# Patient Record
Sex: Male | Born: 1940 | Race: White | Hispanic: No | Marital: Married | Smoking: Former smoker
Health system: Southern US, Community
[De-identification: ages and names within clinical notes are randomized; demographics above are authoritative.]

## PROBLEM LIST (undated history)

## (undated) DIAGNOSIS — C9590 Leukemia, unspecified not having achieved remission: Secondary | ICD-10-CM

## (undated) DIAGNOSIS — L0291 Cutaneous abscess, unspecified: Secondary | ICD-10-CM

## (undated) HISTORY — PX: BACK SURGERY: SHX140

---

## 2004-02-10 ENCOUNTER — Emergency Department (HOSPITAL_COMMUNITY): Admission: EM | Admit: 2004-02-10 | Discharge: 2004-02-10 | Payer: Self-pay | Admitting: Emergency Medicine

## 2004-06-24 ENCOUNTER — Ambulatory Visit: Payer: Self-pay | Admitting: Internal Medicine

## 2004-07-22 ENCOUNTER — Ambulatory Visit: Payer: Self-pay | Admitting: Internal Medicine

## 2009-08-21 ENCOUNTER — Emergency Department (HOSPITAL_COMMUNITY): Admission: EM | Admit: 2009-08-21 | Discharge: 2009-08-21 | Payer: Self-pay | Admitting: Emergency Medicine

## 2009-09-04 ENCOUNTER — Ambulatory Visit: Payer: Self-pay | Admitting: Urology

## 2009-11-21 ENCOUNTER — Ambulatory Visit: Payer: Self-pay | Admitting: Internal Medicine

## 2009-12-11 ENCOUNTER — Ambulatory Visit: Payer: Self-pay | Admitting: Internal Medicine

## 2009-12-22 ENCOUNTER — Ambulatory Visit: Payer: Self-pay | Admitting: Internal Medicine

## 2010-01-22 ENCOUNTER — Ambulatory Visit: Payer: Self-pay | Admitting: Internal Medicine

## 2010-03-17 ENCOUNTER — Ambulatory Visit: Payer: Self-pay

## 2010-03-24 ENCOUNTER — Ambulatory Visit: Payer: Self-pay | Admitting: Unknown Physician Specialty

## 2010-03-31 ENCOUNTER — Inpatient Hospital Stay: Payer: Self-pay | Admitting: Unknown Physician Specialty

## 2010-08-17 LAB — URINALYSIS, ROUTINE W REFLEX MICROSCOPIC
Bilirubin Urine: NEGATIVE
Nitrite: NEGATIVE
Urobilinogen, UA: 0.2 mg/dL (ref 0.0–1.0)
pH: 5.5 (ref 5.0–8.0)

## 2010-08-17 LAB — URINE MICROSCOPIC-ADD ON

## 2012-04-01 ENCOUNTER — Emergency Department: Payer: Self-pay | Admitting: Internal Medicine

## 2012-05-04 ENCOUNTER — Ambulatory Visit: Payer: Self-pay | Admitting: Internal Medicine

## 2012-05-05 ENCOUNTER — Ambulatory Visit: Payer: Self-pay | Admitting: Internal Medicine

## 2012-05-24 ENCOUNTER — Ambulatory Visit: Payer: Self-pay | Admitting: Internal Medicine

## 2013-01-16 ENCOUNTER — Ambulatory Visit: Payer: Self-pay | Admitting: Physical Medicine and Rehabilitation

## 2013-10-21 DIAGNOSIS — C9110 Chronic lymphocytic leukemia of B-cell type not having achieved remission: Secondary | ICD-10-CM | POA: Insufficient documentation

## 2016-07-23 ENCOUNTER — Ambulatory Visit
Admission: RE | Admit: 2016-07-23 | Discharge: 2016-07-23 | Disposition: A | Discharge status: Home / Self Care | Payer: BLUE CROSS/BLUE SHIELD | Source: Ambulatory Visit | Attending: Unknown Physician Specialty | Admitting: Unknown Physician Specialty

## 2016-07-23 ENCOUNTER — Other Ambulatory Visit: Payer: Self-pay | Admitting: Unknown Physician Specialty

## 2016-07-23 DIAGNOSIS — M5137 Other intervertebral disc degeneration, lumbosacral region: Secondary | ICD-10-CM | POA: Diagnosis not present

## 2016-07-23 DIAGNOSIS — M48061 Spinal stenosis, lumbar region without neurogenic claudication: Secondary | ICD-10-CM | POA: Diagnosis not present

## 2016-07-23 DIAGNOSIS — M8938 Hypertrophy of bone, other site: Secondary | ICD-10-CM | POA: Diagnosis not present

## 2016-07-23 DIAGNOSIS — M5136 Other intervertebral disc degeneration, lumbar region: Secondary | ICD-10-CM | POA: Insufficient documentation

## 2016-10-29 ENCOUNTER — Inpatient Hospital Stay
Admission: EM | Admit: 2016-10-29 | Discharge: 2016-11-09 | DRG: 854 | Disposition: A | Discharge status: Home Health | Payer: BLUE CROSS/BLUE SHIELD | Attending: Internal Medicine | Admitting: Internal Medicine

## 2016-10-29 ENCOUNTER — Emergency Department: Payer: BLUE CROSS/BLUE SHIELD

## 2016-10-29 DIAGNOSIS — E669 Obesity, unspecified: Secondary | ICD-10-CM | POA: Diagnosis present

## 2016-10-29 DIAGNOSIS — N132 Hydronephrosis with renal and ureteral calculous obstruction: Secondary | ICD-10-CM | POA: Diagnosis not present

## 2016-10-29 DIAGNOSIS — M25561 Pain in right knee: Secondary | ICD-10-CM | POA: Diagnosis present

## 2016-10-29 DIAGNOSIS — A4101 Sepsis due to Methicillin susceptible Staphylococcus aureus: Principal | ICD-10-CM | POA: Diagnosis present

## 2016-10-29 DIAGNOSIS — N261 Atrophy of kidney (terminal): Secondary | ICD-10-CM | POA: Diagnosis not present

## 2016-10-29 DIAGNOSIS — N179 Acute kidney failure, unspecified: Secondary | ICD-10-CM | POA: Diagnosis present

## 2016-10-29 DIAGNOSIS — K5730 Diverticulosis of large intestine without perforation or abscess without bleeding: Secondary | ICD-10-CM | POA: Diagnosis not present

## 2016-10-29 DIAGNOSIS — R32 Unspecified urinary incontinence: Secondary | ICD-10-CM | POA: Diagnosis present

## 2016-10-29 DIAGNOSIS — Z833 Family history of diabetes mellitus: Secondary | ICD-10-CM

## 2016-10-29 DIAGNOSIS — A419 Sepsis, unspecified organism: Secondary | ICD-10-CM

## 2016-10-29 DIAGNOSIS — K5720 Diverticulitis of large intestine with perforation and abscess without bleeding: Secondary | ICD-10-CM | POA: Diagnosis present

## 2016-10-29 DIAGNOSIS — I472 Ventricular tachycardia: Secondary | ICD-10-CM | POA: Diagnosis not present

## 2016-10-29 DIAGNOSIS — C9110 Chronic lymphocytic leukemia of B-cell type not having achieved remission: Secondary | ICD-10-CM | POA: Diagnosis present

## 2016-10-29 DIAGNOSIS — Z6834 Body mass index (BMI) 34.0-34.9, adult: Secondary | ICD-10-CM

## 2016-10-29 DIAGNOSIS — E860 Dehydration: Secondary | ICD-10-CM | POA: Diagnosis present

## 2016-10-29 DIAGNOSIS — I480 Paroxysmal atrial fibrillation: Secondary | ICD-10-CM | POA: Diagnosis present

## 2016-10-29 DIAGNOSIS — M6282 Rhabdomyolysis: Secondary | ICD-10-CM | POA: Diagnosis present

## 2016-10-29 DIAGNOSIS — I10 Essential (primary) hypertension: Secondary | ICD-10-CM | POA: Diagnosis present

## 2016-10-29 DIAGNOSIS — N201 Calculus of ureter: Secondary | ICD-10-CM | POA: Diagnosis not present

## 2016-10-29 DIAGNOSIS — A0472 Enterocolitis due to Clostridium difficile, not specified as recurrent: Secondary | ICD-10-CM | POA: Diagnosis present

## 2016-10-29 DIAGNOSIS — E876 Hypokalemia: Secondary | ICD-10-CM | POA: Diagnosis present

## 2016-10-29 DIAGNOSIS — R1032 Left lower quadrant pain: Secondary | ICD-10-CM

## 2016-10-29 DIAGNOSIS — Z87891 Personal history of nicotine dependence: Secondary | ICD-10-CM

## 2016-10-29 DIAGNOSIS — L02212 Cutaneous abscess of back [any part, except buttock]: Secondary | ICD-10-CM | POA: Diagnosis present

## 2016-10-29 DIAGNOSIS — W19XXXA Unspecified fall, initial encounter: Secondary | ICD-10-CM

## 2016-10-29 DIAGNOSIS — K5792 Diverticulitis of intestine, part unspecified, without perforation or abscess without bleeding: Secondary | ICD-10-CM

## 2016-10-29 DIAGNOSIS — Z79899 Other long term (current) drug therapy: Secondary | ICD-10-CM | POA: Diagnosis not present

## 2016-10-29 DIAGNOSIS — L03319 Cellulitis of trunk, unspecified: Secondary | ICD-10-CM

## 2016-10-29 DIAGNOSIS — L02219 Cutaneous abscess of trunk, unspecified: Secondary | ICD-10-CM | POA: Diagnosis present

## 2016-10-29 HISTORY — DX: Leukemia, unspecified not having achieved remission: C95.90

## 2016-10-29 LAB — URINALYSIS, ROUTINE W REFLEX MICROSCOPIC
Bilirubin Urine: NEGATIVE
GLUCOSE, UA: NEGATIVE mg/dL
Ketones, ur: NEGATIVE mg/dL
Leukocytes, UA: NEGATIVE
Nitrite: NEGATIVE
PROTEIN: NEGATIVE mg/dL
Specific Gravity, Urine: 1.018 (ref 1.005–1.030)
pH: 5 (ref 5.0–8.0)

## 2016-10-29 LAB — COMPREHENSIVE METABOLIC PANEL
ALK PHOS: 62 U/L (ref 38–126)
ALT: 55 U/L (ref 17–63)
AST: 80 U/L — AB (ref 15–41)
Albumin: 3.5 g/dL (ref 3.5–5.0)
Anion gap: 13 (ref 5–15)
BILIRUBIN TOTAL: 1 mg/dL (ref 0.3–1.2)
BUN: 34 mg/dL — AB (ref 6–20)
CHLORIDE: 96 mmol/L — AB (ref 101–111)
CO2: 25 mmol/L (ref 22–32)
CREATININE: 1.65 mg/dL — AB (ref 0.61–1.24)
Calcium: 9.6 mg/dL (ref 8.9–10.3)
GFR calc Af Amer: 46 mL/min — ABNORMAL LOW (ref >60)
GFR, EST NON AFRICAN AMERICAN: 39 mL/min — AB (ref >60)
Glucose, Bld: 118 mg/dL — ABNORMAL HIGH (ref 65–99)
Potassium: 3.8 mmol/L (ref 3.5–5.1)
Sodium: 134 mmol/L — ABNORMAL LOW (ref 135–145)
TOTAL PROTEIN: 6.9 g/dL (ref 6.5–8.1)

## 2016-10-29 LAB — CBC
HEMATOCRIT: 45.4 % (ref 40.0–52.0)
HEMOGLOBIN: 15.3 g/dL (ref 13.0–18.0)
MCH: 30.7 pg (ref 26.0–34.0)
MCHC: 33.6 g/dL (ref 32.0–36.0)
MCV: 91.3 fL (ref 80.0–100.0)
Platelets: 368 10*3/uL (ref 150–440)
RBC: 4.97 MIL/uL (ref 4.40–5.90)
RDW: 13.3 % (ref 11.5–14.5)
WBC: 41.2 10*3/uL — AB (ref 3.8–10.6)

## 2016-10-29 LAB — DIFFERENTIAL
BASOS PCT: 0 %
BLASTS: 0 %
Band Neutrophils: 1 %
Basophils Absolute: 0 10*3/uL (ref 0–0.1)
EOS PCT: 0 %
Eosinophils Absolute: 0 10*3/uL (ref 0–0.7)
LYMPHS ABS: 17.7 10*3/uL — AB (ref 1.0–3.6)
Lymphocytes Relative: 43 %
METAMYELOCYTES PCT: 0 %
Monocytes Absolute: 2.5 10*3/uL — ABNORMAL HIGH (ref 0.2–1.0)
Monocytes Relative: 6 %
Myelocytes: 0 %
NRBC: 0 /100{WBCs}
Neutro Abs: 21 10*3/uL — ABNORMAL HIGH (ref 1.4–6.5)
Neutrophils Relative %: 50 %
Other: 0 %
Promyelocytes Absolute: 0 %

## 2016-10-29 LAB — LACTIC ACID, PLASMA: Lactic Acid, Venous: 1.3 mmol/L (ref 0.5–1.9)

## 2016-10-29 LAB — CK: Total CK: 1184 U/L — ABNORMAL HIGH (ref 49–397)

## 2016-10-29 MED ORDER — VANCOMYCIN HCL 10 G IV SOLR
1250.0000 mg | Freq: Two times a day (BID) | INTRAVENOUS | Status: DC
  Administered 2016-10-29 – 2016-11-03 (×10): 1250 mg via INTRAVENOUS
  Filled 2016-10-29 (×11): qty 1250

## 2016-10-29 MED ORDER — ENOXAPARIN SODIUM 40 MG/0.4ML ~~LOC~~ SOLN
40.0000 mg | SUBCUTANEOUS | Status: DC
  Administered 2016-10-29 – 2016-10-30 (×2): 40 mg via SUBCUTANEOUS
  Filled 2016-10-29 (×5): qty 0.4

## 2016-10-29 MED ORDER — ONDANSETRON HCL 4 MG PO TABS
4.0000 mg | ORAL_TABLET | Freq: Three times a day (TID) | ORAL | Status: DC | PRN
  Administered 2016-11-09: 4 mg via ORAL
  Filled 2016-10-29: qty 1

## 2016-10-29 MED ORDER — VANCOMYCIN HCL IN DEXTROSE 1-5 GM/200ML-% IV SOLN
1000.0000 mg | Freq: Once | INTRAVENOUS | Status: AC
Start: 2016-10-29 — End: 2016-10-29
  Administered 2016-10-29: 1000 mg via INTRAVENOUS
  Filled 2016-10-29: qty 200

## 2016-10-29 MED ORDER — HYDROCODONE-ACETAMINOPHEN 5-325 MG PO TABS
1.0000 | ORAL_TABLET | Freq: Every evening | ORAL | Status: DC | PRN
  Administered 2016-10-31 (×2): 1 via ORAL
  Filled 2016-10-29 (×3): qty 1

## 2016-10-29 MED ORDER — PIPERACILLIN-TAZOBACTAM 3.375 G IVPB 30 MIN
3.3750 g | Freq: Once | INTRAVENOUS | Status: AC
  Administered 2016-10-29: 3.375 g via INTRAVENOUS
  Filled 2016-10-29: qty 50

## 2016-10-29 MED ORDER — PIPERACILLIN-TAZOBACTAM 3.375 G IVPB
3.3750 g | Freq: Three times a day (TID) | INTRAVENOUS | Status: DC
  Administered 2016-10-29 – 2016-11-01 (×9): 3.375 g via INTRAVENOUS
  Filled 2016-10-29 (×11): qty 50

## 2016-10-29 MED ORDER — ACETAMINOPHEN 325 MG PO TABS: 650.0000 mg | ORAL_TABLET | Freq: Four times a day (QID) | ORAL | Status: DC | PRN

## 2016-10-29 MED ORDER — SODIUM CHLORIDE 0.9 % IV BOLUS (SEPSIS)
1000.0000 mL | Freq: Once | INTRAVENOUS | Status: AC
  Administered 2016-10-29: 1000 mL via INTRAVENOUS

## 2016-10-29 MED ORDER — DOCUSATE SODIUM 100 MG PO CAPS
100.0000 mg | ORAL_CAPSULE | Freq: Two times a day (BID) | ORAL | Status: DC
  Administered 2016-10-29 – 2016-10-31 (×5): 100 mg via ORAL
  Filled 2016-10-29 (×8): qty 1

## 2016-10-29 MED ORDER — VANCOMYCIN HCL 10 G IV SOLR
1250.0000 mg | INTRAVENOUS | Status: DC
  Filled 2016-10-29 (×2): qty 1250

## 2016-10-29 MED ORDER — SODIUM CHLORIDE 0.9 % IV SOLN
INTRAVENOUS | Status: AC
  Administered 2016-10-29 – 2016-10-30 (×2): via INTRAVENOUS

## 2016-10-29 MED ORDER — ACETAMINOPHEN 325 MG PO TABS
650.0000 mg | ORAL_TABLET | Freq: Once | ORAL | Status: AC
  Administered 2016-10-29: 650 mg via ORAL
  Filled 2016-10-29: qty 2

## 2016-10-29 MED ORDER — TRAMADOL HCL 50 MG PO TABS
50.0000 mg | ORAL_TABLET | Freq: Four times a day (QID) | ORAL | Status: DC | PRN
  Administered 2016-10-31 – 2016-11-09 (×15): 50 mg via ORAL
  Filled 2016-10-29 (×15): qty 1

## 2016-10-29 NOTE — H&P (Signed)
[facility] NAME: [name]    MR#:  [id number]  DATE OF BIRTH:  01-31-1941  DATE OF ADMISSION:  10/29/2016  PRIMARY CARE PHYSICIAN: [name], MD   REQUESTING/REFERRING PHYSICIAN: Corky Downs  CHIEF COMPLAINT:   Fall and generalized weakness HISTORY OF PRESENT ILLNESS:  William Kennedy  is a 76 y.o. male with a known history of CLL, with baseline WBC at around 25,000 is presenting to the ED after he sustained a fall last night. Patient reports he was feeling extremely weak and legs are giving away. Last night patient slid down to the floor and was unable to get up. He was laying on the floor for approximately 6 hours and then called EMS who helped him to go back to bed. Patient woke up today with body aches. Patient also has his abscess on the back drained by Dr. Tamala Julian, culture has revealed Staphylococcus aureus and patient was started on Augmentin yesterday. Patient is not quite sure whether it is MRSA. Hospitalist team is called to admit the patient as patient's white count is elevated and he was tachycardic for sepsis called by the ED  PAST MEDICAL HISTORY:   Past Medical History:  Diagnosis Date  . Leukemia ([place])     PAST SURGICAL HISTOIRY:   Past Surgical History:  Procedure Laterality Date  . BACK SURGERY      SOCIAL HISTORY:   Social History  Substance Use Topics  . Smoking status: Former Research scientist (life sciences)  . Smokeless tobacco: Former Systems developer  . Alcohol use No    FAMILY HISTORY:   Family History  Problem Relation Age of Onset  . Diabetes Mother   . Cancer Father     DRUG ALLERGIES:  No Known Allergies  REVIEW OF SYSTEMS:  CONSTITUTIONAL: No fever, Reports fatigue or weakness.  EYES: No blurred or double vision.  EARS, NOSE, AND THROAT: No tinnitus or ear pain.  RESPIRATORY: No cough, shortness of breath, wheezing or hemoptysis.  CARDIOVASCULAR: No chest pain, orthopnea, edema.  GASTROINTESTINAL: No  nausea, vomiting, diarrhea or abdominal pain.  GENITOURINARY: No dysuria, hematuria.  ENDOCRINE: No polyuria, nocturia,  HEMATOLOGY: No anemia, easy bruising or bleeding SKIN: Skin on the left side of the back is red and has 2 drains from the abscess  MUSCULOSKELETAL: No joint pain or arthritis.   NEUROLOGIC: No tingling, numbness, weakness.  PSYCHIATRY: No anxiety or depression.   MEDICATIONS AT HOME:   Prior to Admission medications   Medication Sig Start Date End Date Taking? Authorizing Provider  acetaminophen (TYLENOL) 325 MG tablet Take 650 mg by mouth every 4 (four) hours as needed for pain.   Yes [provider]  amoxicillin-clavulanate (AUGMENTIN) 875-125 MG tablet Take 1 tablet by mouth 2 (two) times daily. 10/28/16 11/06/16 Yes [provider]  HYDROcodone-acetaminophen (NORCO/VICODIN) 5-325 MG tablet Take 1-2 tablets by mouth at bedtime as needed for pain. 10/25/16  Yes [provider]  ondansetron (ZOFRAN) 4 MG tablet Take 4 mg by mouth every 8 (eight) hours as needed for nausea. 10/28/16  Yes [provider]  traMADol (ULTRAM) 50 MG tablet Take 50 mg by mouth every 6 (six) hours as needed for pain. 08/16/16  Yes [provider]      VITAL SIGNS:  Blood pressure (!) 119/57, pulse 92, temperature 97.8 F (36.6 C), temperature source Oral, resp. rate 20, height 6\' 2"  (1.88 m), weight 122.5 kg (270 lb), SpO2 97 %.  PHYSICAL EXAMINATION:  GENERAL:  76 y.o.-year-old patient lying in the bed with no acute distress.  EYES: Pupils equal, round, reactive to light and accommodation. No scleral icterus. Extraocular muscles intact.  HEENT: Head atraumatic, normocephalic. Oropharynx and nasopharynx clear.  NECK:  Supple, no jugular venous distention. No thyroid enlargement, no tenderness.  LUNGS: Normal breath sounds bilaterally, no wheezing, rales,rhonchi or crepitation. No use of accessory muscles of respiration.  CARDIOVASCULAR: S1, S2 normal. No  murmurs, rubs, or gallops.  ABDOMEN: Soft, nontender, nondistended. Bowel sounds present. No organomegaly or mass.  EXTREMITIES: No pedal edema, cyanosis, or clubbing.  NEUROLOGIC: Cranial nerves II through XII are intact. Muscle strength 5/5 in all extremities. Sensation intact. Gait not checked.  PSYCHIATRIC: The patient is alert and oriented x 3.  SKIN: Left side of the back is erythematous , drainage of pus from the drains placed in abscess  LABORATORY PANEL:   CBC  Recent Labs Lab 10/29/16 1039  WBC 41.2*  HGB 15.3  HCT 45.4  PLT 368   ------------------------------------------------------------------------------------------------------------------  Chemistries   Recent Labs Lab 10/29/16 1039  NA 134*  K 3.8  CL 96*  CO2 25  GLUCOSE 118*  BUN 34*  CREATININE 1.65*  CALCIUM 9.6  AST 80*  ALT 55  ALKPHOS 62  BILITOT 1.0   ------------------------------------------------------------------------------------------------------------------  Cardiac Enzymes No results for input(s): TROPONINI in the last 168 hours. ------------------------------------------------------------------------------------------------------------------  RADIOLOGY:  Dg Chest 1 View  Result Date: 10/29/2016 CLINICAL DATA:  Pain after fall EXAM: CHEST 1 VIEW COMPARISON:  None. FINDINGS: The heart size and mediastinal contours are within normal limits. Both lungs are clear. The visualized skeletal structures are unremarkable. IMPRESSION: No active disease. Electronically Signed   By: Dorise Bullion III M.D   On: 10/29/2016 11:05   Dg Knee Complete 4 Views Right  Result Date: 10/29/2016 CLINICAL DATA:  Pain after fall. EXAM: RIGHT KNEE - COMPLETE 4+ VIEW COMPARISON:  None. FINDINGS: No evidence of fracture, dislocation, or joint effusion. No evidence of arthropathy or other focal bone abnormality. Soft tissues are unremarkable. IMPRESSION: Negative. Electronically Signed   By: Dorise Bullion III  M.D   On: 10/29/2016 11:04    EKG:   Orders placed or performed in visit on 03/31/10  . EKG 12-Lead    IMPRESSION AND PLAN:   William Kennedy  is a 76 y.o. male with a known history of CLL, with baseline WBC at around 25,000 is presenting to the ED after he sustained a fall last night. Patient reports he was feeling extremely weak and legs are giving away. Last night patient slid down to the floor and was unable to get up. He was laying on the floor for approximately 6 hours and then called EMS who helped him to go back to bed. Patient woke up today with body aches. Patient also has his abscess on the back drained by Dr. Tamala Julian, culture has revealed Staphylococcus aureus  # Sepsis-meets criteria with leukocytosis and tachycardia Source is the abscess on the back with surrounding cellulitis We'll get a wound culture and sensitivity IV Zosyn and vancomycin Consult surgery Dr. Tamala Julian Patient had a wound culture and sensitivity done on last Monday by Dr. Tamala Julian which has revealed Staphylococcus aureus but patient is not quite sure whether it is MRSA or not. We will put the patient on isolation Repeat a.m. Labs  #Generalized weakness secondary to dehydration and rhabdomyolysis Hydrate with IV fluids Monitor renal function closely Check total CK in a.m. CK in the emergency department  is 53  #AKI IV fluids, monitor renal function Avoid nephrotoxins  #Chronic lymphocytic leukemia White count is elevated from sepsis Repeat CBC Will consult oncology if needed   All the records are reviewed and case discussed with ED provider. Management plans discussed with the patient, family and they are in agreement.  CODE STATUS: fc , wife is HCPOA  TOTAL TIME TAKING CARE OF THIS PATIENT: 43 minutes.   Note: This dictation was prepared with Dragon dictation along with smaller phrase technology. Any transcriptional errors that result from this process are unintentional.  Nicholes Mango M.D on 10/29/2016  at 1:42 PM  Between 7am to 6pm - Pager - [phone]  After 6pm go to [email or web address] - password [email or web address] [facility]  [facility]  Office  [phone]  CC: Primary care physician; [name], MD

## 2016-10-29 NOTE — ED Notes (Signed)
Dr. Tamala Julian present at bedside to assess pt's wound/drain prior to transport.

## 2016-10-29 NOTE — Progress Notes (Signed)
Anticoagulation monitoring(Lovenox):  76 yo male ordered Lovenox 30 mg Q24h  Filed Weights   10/29/16 0957  Weight: 270 lb (122.5 kg)   BMI    Lab Results  Component Value Date   CREATININE 1.65 (H) 10/29/2016   Estimated Creatinine Clearance: 54.6 mL/min (A) (by C-G formula based on SCr of 1.65 mg/dL (H)). Hemoglobin & Hematocrit     Component Value Date/Time   HGB 15.3 10/29/2016 1039   HCT 45.4 10/29/2016 1039     Per Protocol for Patient with estCrcl > 30 ml/min and BMI < 40, will transition to Lovenox 40 mg Q24h.

## 2016-10-29 NOTE — ED Triage Notes (Signed)
Pt to ED via [facility] c/o fall. EMS reports pt fell last night, and pain became progressively worse this AM. Pt c/o right leg pain "from the knee down". Pt alert and oriented in no acute distress at this time.

## 2016-10-29 NOTE — Consult Note (Signed)
William Kennedy is an 76 y.o. male.   Chief Complaint: weakness HPI: He was recently evaluated in the office due to a 2 month history of enlarging mass of the left upper back over the shoulder blade.  He had minimal discomfort at this site.  No chills or fever.  He had noted some redness and purple discoloration.  On 518 had incision and drainage of the large abscess of the left upper back with insertion of 2 Penrose drains.  There was a large amount of drainage and culture demonstrated normal flora.  He later returned to the office on 10/26/2018 with findings of purulent drainage.  There was also some surrounding erythema.  A culture was done and yesterday the culture was reported as Staphylococcus aureus sensitive to oxacillin.  He was started on a course of Augmentin which he began last evening.  He also has been nauseated and was given a prescription for Zofran.  He also reports recent development of weakness in his legs.  Last night he slid down to the floor at his home and remain on the floor for some 6 hours.  An ambulance was called and he was assisted back into bed.  He was advised to go to the hospital but did not want to go to the hospital last night.  His wife called this morning indicating he has weakness in his agreeable to come into the hospital.  He was therefore brought to the emergency room in a patient initially evaluated by the emergency room staff and referred to Dr.Gouru for admission.    Past Medical History:  Diagnosis Date  . Leukemia [facility])     Past Surgical History:  Procedure Laterality Date  . BACK SURGERY      Family History  Problem Relation Age of Onset  . Diabetes Mother   . Cancer Father    Social History:  reports that he has quit smoking. He has quit using smokeless tobacco. He reports that he does not drink alcohol or use drugs.  Allergies: No Known Allergies  Medications Prior to Admission  Medication Sig Dispense Refill  . acetaminophen (TYLENOL)  325 MG tablet Take 650 mg by mouth every 4 (four) hours as needed for pain.    Marland Kitchen amoxicillin-clavulanate (AUGMENTIN) 875-125 MG tablet Take 1 tablet by mouth 2 (two) times daily.    Marland Kitchen HYDROcodone-acetaminophen (NORCO/VICODIN) 5-325 MG tablet Take 1-2 tablets by mouth at bedtime as needed for pain.    Marland Kitchen ondansetron (ZOFRAN) 4 MG tablet Take 4 mg by mouth every 8 (eight) hours as needed for nausea.    . traMADol (ULTRAM) 50 MG tablet Take 50 mg by mouth every 6 (six) hours as needed for pain.      Results for orders placed or performed during the hospital encounter of 10/29/16 (from the past 48 hour(s))  CK     Status: Abnormal   Collection Time: 10/29/16 10:39 AM  Result Value Ref Range   Total CK 1,184 (H) 49 - 397 U/L  CBC     Status: Abnormal   Collection Time: 10/29/16 10:39 AM  Result Value Ref Range   WBC 41.2 (H) 3.8 - 10.6 K/uL   RBC 4.97 4.40 - 5.90 MIL/uL   Hemoglobin 15.3 13.0 - 18.0 g/dL   HCT 45.4 40.0 - 52.0 %   MCV 91.3 80.0 - 100.0 fL   MCH 30.7 26.0 - 34.0 pg   MCHC 33.6 32.0 - 36.0 g/dL   RDW 13.3 11.5 -  14.5 %   Platelets 368 150 - 440 K/uL  Comprehensive metabolic panel     Status: Abnormal   Collection Time: 10/29/16 10:39 AM  Result Value Ref Range   Sodium 134 (L) 135 - 145 mmol/L   Potassium 3.8 3.5 - 5.1 mmol/L   Chloride 96 (L) 101 - 111 mmol/L   CO2 25 22 - 32 mmol/L   Glucose, Bld 118 (H) 65 - 99 mg/dL   BUN 34 (H) 6 - 20 mg/dL   Creatinine, Ser 1.65 (H) 0.61 - 1.24 mg/dL   Calcium 9.6 8.9 - 10.3 mg/dL   Total Protein 6.9 6.5 - 8.1 g/dL   Albumin 3.5 3.5 - 5.0 g/dL   AST 80 (H) 15 - 41 U/L   ALT 55 17 - 63 U/L   Alkaline Phosphatase 62 38 - 126 U/L   Total Bilirubin 1.0 0.3 - 1.2 mg/dL   GFR calc non Af Amer 39 (L) >60 mL/min   GFR calc Af Amer 46 (L) >60 mL/min    Comment: (NOTE) The eGFR has been calculated using the CKD EPI equation. This calculation has not been validated in all clinical situations. eGFR's persistently <60 mL/min signify  possible Chronic Kidney Disease.    Anion gap 13 5 - 15  Differential     Status: Abnormal   Collection Time: 10/29/16 10:39 AM  Result Value Ref Range   Neutrophils Relative % 50 %   Lymphocytes Relative 43 %   Monocytes Relative 6 %   Eosinophils Relative 0 %   Basophils Relative 0 %   Band Neutrophils 1 %   Metamyelocytes Relative 0 %   Myelocytes 0 %   Promyelocytes Absolute 0 %   Blasts 0 %   nRBC 0 0 /100 WBC   Other 0 %   Neutro Abs 21.0 (H) 1.4 - 6.5 K/uL   Lymphs Abs 17.7 (H) 1.0 - 3.6 K/uL   Monocytes Absolute 2.5 (H) 0.2 - 1.0 K/uL   Eosinophils Absolute 0.0 0 - 0.7 K/uL   Basophils Absolute 0.0 0 - 0.1 K/uL   RBC Morphology MIXED RBC POPULATION    WBC Morphology ATYPICAL LYMPHOCYTES   Urinalysis, Routine w reflex microscopic     Status: Abnormal   Collection Time: 10/29/16 12:41 PM  Result Value Ref Range   Color, Urine YELLOW (A) YELLOW   APPearance HAZY (A) CLEAR   Specific Gravity, Urine 1.018 1.005 - 1.030   pH 5.0 5.0 - 8.0   Glucose, UA NEGATIVE NEGATIVE mg/dL   Hgb urine dipstick MODERATE (A) NEGATIVE   Bilirubin Urine NEGATIVE NEGATIVE   Ketones, ur NEGATIVE NEGATIVE mg/dL   Protein, ur NEGATIVE NEGATIVE mg/dL   Nitrite NEGATIVE NEGATIVE   Leukocytes, UA NEGATIVE NEGATIVE   RBC / HPF 6-30 0 - 5 RBC/hpf   WBC, UA 0-5 0 - 5 WBC/hpf   Bacteria, UA RARE (A) NONE SEEN   Squamous Epithelial / LPF 0-5 (A) NONE SEEN   Uric Acid Crys, UA PRESENT   Lactic acid, plasma     Status: None   Collection Time: 10/29/16 12:41 PM  Result Value Ref Range   Lactic Acid, Venous 1.3 0.5 - 1.9 mmol/L   Dg Chest 1 View  Result Date: 10/29/2016 CLINICAL DATA:  Pain after fall EXAM: CHEST 1 VIEW COMPARISON:  None. FINDINGS: The heart size and mediastinal contours are within normal limits. Both lungs are clear. The visualized skeletal structures are unremarkable. IMPRESSION: No active disease. Electronically Signed  By: Dorise Bullion III M.D   On: 10/29/2016 11:05   Dg  Knee Complete 4 Views Right  Result Date: 10/29/2016 CLINICAL DATA:  Pain after fall. EXAM: RIGHT KNEE - COMPLETE 4+ VIEW COMPARISON:  None. FINDINGS: No evidence of fracture, dislocation, or joint effusion. No evidence of arthropathy or other focal bone abnormality. Soft tissues are unremarkable. IMPRESSION: Negative. Electronically Signed   By: Dorise Bullion III M.D   On: 10/29/2016 11:04    Blood pressure (!) 149/72, pulse (!) 101, temperature 97.6 F (36.4 C), temperature source Oral, resp. rate 20, height 6' 2"  (1.88 m), weight 270 lb (122.5 kg), SpO2 91 %.  Physical Exam: He is awake alert and oriented and up in a wheelchair.  Examination of the back was done by removing his dressing.  There is a wide area of erythema which extends some 20 cm across the left upper back.  There is some denudation of the skin.  The Penrose drains remain intact.  There is some purulent drainage.  There is some local tenderness.  A large new cotton gauze dressing was applied with paper tape    Assessment/Plan Subcutaneous abscess of back with culture growth of Staphylococcus aureus sensitive to oxacillin.  Plan hospital admission and IV vancocin and Zosyn.  Change dressings BID and as needed of drainage.   I discussed this plan with Mr. Sciascia and Dr Margaretmary Eddy.  Will re-examine on Monday. Rochel Brome, MD 10/29/2016, 3:42 PM

## 2016-10-29 NOTE — ED Provider Notes (Signed)
[facility] Emergency Department Provider Note   ____________________________________________    I have reviewed the triage vital signs and the nursing notes.   HISTORY  Chief Complaint Fall     HPI SAATVIK THIELMAN is a 76 y.o. male who reports he fell last night. Patient reports he has "weak legs "chronically. Last night he slid down to the floor and was unable to get up, he was on a tile floor for 6 hours before calling EMS who helped him into his bed. This morning he woke up and felt quite sore all over, primarily his pain is in his right knee. No chest pain or palpitations. No shortness of breath. No abdominal pain or nausea or vomiting. Currently being treated for an abscess to the left shoulder by Dr. Tamala Julian. Patient has CLL with chronically elevated WBC   Past Medical History:  Diagnosis Date  . Leukemia ([place])     There are no active problems to display for this patient.   Past Surgical History:  Procedure Laterality Date  . BACK SURGERY      Prior to Admission medications   Medication Sig Start Date End Date Taking? Authorizing Provider  acetaminophen (TYLENOL) 325 MG tablet Take 650 mg by mouth every 4 (four) hours as needed for pain.   Yes [provider]  amoxicillin-clavulanate (AUGMENTIN) 875-125 MG tablet Take 1 tablet by mouth 2 (two) times daily. 10/28/16 11/06/16 Yes [provider]  HYDROcodone-acetaminophen (NORCO/VICODIN) 5-325 MG tablet Take 1-2 tablets by mouth at bedtime as needed for pain. 10/25/16  Yes [provider]  ondansetron (ZOFRAN) 4 MG tablet Take 4 mg by mouth every 8 (eight) hours as needed for nausea. 10/28/16  Yes [provider]  traMADol (ULTRAM) 50 MG tablet Take 50 mg by mouth every 6 (six) hours as needed for pain. 08/16/16  Yes [provider]     Allergies Patient has no known allergies.  Family History  Problem Relation Age of Onset  . Diabetes Mother   .  Cancer Father     Social History Social History  Substance Use Topics  . Smoking status: Former Research scientist (life sciences)  . Smokeless tobacco: Former Systems developer  . Alcohol use No    Review of Systems  Constitutional: No fever/chills Eyes: No visual changes.  ENT: No sore throat. Cardiovascular: Denies chest pain. Respiratory: Denies shortness of breath. Gastrointestinal: No abdominal pain.  No nausea, no vomiting.   Genitourinary: Negative for dysuria. Musculoskeletal: As above Skin: Negative for rash. Neurological: Negative for headaches   ____________________________________________   PHYSICAL EXAM:  VITAL SIGNS: ED Triage Vitals  Enc Vitals Group     BP 10/29/16 0956 (!) 146/81     Pulse Rate 10/29/16 0956 (!) 105     Resp 10/29/16 0956 18     Temp 10/29/16 0956 97.8 F (36.6 C)     Temp Source 10/29/16 0956 Oral     SpO2 10/29/16 0956 97 %     Weight 10/29/16 0957 122.5 kg (270 lb)     Height 10/29/16 0957 1.88 m (6\' 2" )     Head Circumference --      Peak Flow --      Pain Score 10/29/16 0954 5     Pain Loc --      Pain Edu? --      Excl. in [place]? --     Constitutional: Alert and oriented. No acute distress. Pleasant and interactive Eyes: Conjunctivae are normal.  Mouth/Throat: Mucous membranes are moist.    Cardiovascular: Tachycardia, regular rhythm. Grossly normal heart sounds.  Good peripheral circulation. Respiratory: Normal respiratory effort.  No retractions. Lungs CTAB. Gastrointestinal: Soft and nontender. No distention.  No CVA tenderness.  Musculoskeletal: Right knee exam is overall unremarkable, no significant swelling, range of motion with some discomfort but mild. No bony abnormalities. No bruising.  Warm and well perfused Neurologic:  Normal speech and language. No gross focal neurologic deficits are appreciated.  Skin:  Skin is warm, dry. Significant purulent drainage from large cavity left upper back with surrounding erythema likely cellulitis.  Psychiatric:  Mood and affect are normal. Speech and behavior are normal.  ____________________________________________   LABS (all labs ordered are listed, but only abnormal results are displayed)  Labs Reviewed  CK - Abnormal; Notable for the following:       Result Value   Total CK 1,184 (*)    All other components within normal limits  CBC - Abnormal; Notable for the following:    WBC 41.2 (*)    All other components within normal limits  COMPREHENSIVE METABOLIC PANEL - Abnormal; Notable for the following:    Sodium 134 (*)    Chloride 96 (*)    Glucose, Bld 118 (*)    BUN 34 (*)    Creatinine, Ser 1.65 (*)    AST 80 (*)    GFR calc non Af Amer 39 (*)    GFR calc Af Amer 46 (*)    All other components within normal limits  CULTURE, BLOOD (ROUTINE X 2)  CULTURE, BLOOD (ROUTINE X 2)  URINALYSIS, ROUTINE W REFLEX MICROSCOPIC  LACTIC ACID, PLASMA  LACTIC ACID, PLASMA  DIFFERENTIAL   ____________________________________________  EKG  None ____________________________________________  RADIOLOGY  X-ray right knee ____________________________________________   PROCEDURES  Procedure(s) performed: No    Critical Care performed: No ____________________________________________   INITIAL IMPRESSION / ASSESSMENT AND PLAN / ED COURSE  Pertinent labs & imaging results that were available during my care of the patient were reviewed by me and considered in my medical decision making (see chart for details).  Patient presents after a fall. He is overall well-appearing and in no acute distress. Right knee pain, exam is reassuring but we will obtain imaging. I'll also send a CK given his prolonged downtime  ----------------------------------------- 12:18 PM on 10/29/2016 -----------------------------------------  Patient's WBC is 40K. It seems his baseline is more in the 27K range. He is also tachycardic and somewhat ill appearing with a clear source of infection. Difficult to be  certain but he could be septic.He will certainly require IV abx.   I will notify Dr. Tamala Julian and admit to the hospitalist service for further management.   ____________________________________________   FINAL CLINICAL IMPRESSION(S) / ED DIAGNOSES  Final diagnoses:  Sepsis, due to unspecified organism [facility])  Fall, initial encounter      NEW MEDICATIONS STARTED DURING THIS VISIT:  New Prescriptions   No medications on file     Note:  This document was prepared using Dragon voice recognition software and may include unintentional dictation errors.    Lavonia Drafts, MD 10/29/16 1224

## 2016-10-29 NOTE — Progress Notes (Signed)
Family Meeting Note  Advance Directive:yes  Today a meeting took place with the Patient, spouse     The following clinical team members were present during this meeting:MD  The following were discussed:Patient's diagnosis: plan of care discussed  , Patient's progosis: Unable to determine and Goals for treatment: Full Code, wife is HCPOA  Additional follow-up to be provided: Hospitalist and surgery  Time spent during discussion:16 min  William Kennedy, William Silver, MD

## 2016-10-29 NOTE — Progress Notes (Addendum)
Pharmacy Antibiotic Note  William Kennedy is a 76 y.o. male admitted on 10/29/2016 with sepsis/cellultis.  Pharmacy has been consulted for vancomycin and Zosyn dosing. Patient received one time dose in the ED on 6/8. Patient with abscess on back with surrounding cellulitis.   Plan: In setting of abscess and sepsis will aim for goal trough of 15-20. Using adjusted body weight, will initiate patient on vancomycin 1250mg  IV Q12hr. Will start first dose of regimen at 1700. Will obtain trough prior to 5th dose of vancomycin. Will check serum creatinine with am labs on 6/9.   Will initate Zosyn EI 3.375g IV Q8hr.   Height: 6\' 2"  (188 cm) Weight: 270 lb (122.5 kg) IBW/kg (Calculated) : 82.2 ABW: 98kg   Temp (24hrs), Avg:97.7 F (36.5 C), Min:97.6 F (36.4 C), Max:97.8 F (36.6 C)   Recent Labs Lab 10/29/16 1039 10/29/16 1241  WBC 41.2*  --   CREATININE 1.65*  --   LATICACIDVEN  --  1.3    Estimated Creatinine Clearance: 54.6 mL/min (A) (by C-G formula based on SCr of 1.65 mg/dL (H)).    No Known Allergies  Antimicrobials this admission: Vancomycin 6/8 >>  Zosyn 6/8 >>   Dose adjustments this admission: N/A  Microbiology results: 6/8 BCx: pending  6/8 WoundCx: pending   Thank you for allowing pharmacy to be a part of this patient's care.  Natoshia Souter L 10/29/2016 3:54 PM

## 2016-10-30 LAB — COMPREHENSIVE METABOLIC PANEL
ALT: 54 U/L (ref 17–63)
AST: 58 U/L — AB (ref 15–41)
Albumin: 3.1 g/dL — ABNORMAL LOW (ref 3.5–5.0)
Alkaline Phosphatase: 56 U/L (ref 38–126)
Anion gap: 11 (ref 5–15)
BILIRUBIN TOTAL: 1 mg/dL (ref 0.3–1.2)
BUN: 24 mg/dL — ABNORMAL HIGH (ref 6–20)
CHLORIDE: 99 mmol/L — AB (ref 101–111)
CO2: 26 mmol/L (ref 22–32)
CREATININE: 1.23 mg/dL (ref 0.61–1.24)
Calcium: 9 mg/dL (ref 8.9–10.3)
GFR calc Af Amer: >60 mL/min (ref >60)
GFR, EST NON AFRICAN AMERICAN: 56 mL/min — AB (ref >60)
GLUCOSE: 96 mg/dL (ref 65–99)
Potassium: 3.5 mmol/L (ref 3.5–5.1)
Sodium: 136 mmol/L (ref 135–145)
Total Protein: 6.4 g/dL — ABNORMAL LOW (ref 6.5–8.1)

## 2016-10-30 LAB — CBC
HEMATOCRIT: 45.1 % (ref 40.0–52.0)
Hemoglobin: 15.2 g/dL (ref 13.0–18.0)
MCH: 31.3 pg (ref 26.0–34.0)
MCHC: 33.7 g/dL (ref 32.0–36.0)
MCV: 93.1 fL (ref 80.0–100.0)
Platelets: 325 10*3/uL (ref 150–440)
RBC: 4.84 MIL/uL (ref 4.40–5.90)
RDW: 13.1 % (ref 11.5–14.5)
WBC: 31.7 10*3/uL — AB (ref 3.8–10.6)

## 2016-10-30 LAB — MAGNESIUM: Magnesium: 1.9 mg/dL (ref 1.7–2.4)

## 2016-10-30 LAB — CK: CK TOTAL: 320 U/L (ref 49–397)

## 2016-10-30 MED ORDER — METOPROLOL TARTRATE 25 MG PO TABS
25.0000 mg | ORAL_TABLET | Freq: Two times a day (BID) | ORAL | Status: DC
  Administered 2016-10-30 – 2016-11-09 (×21): 25 mg via ORAL
  Filled 2016-10-30 (×21): qty 1

## 2016-10-30 MED ORDER — POTASSIUM CHLORIDE CRYS ER 20 MEQ PO TBCR
40.0000 meq | EXTENDED_RELEASE_TABLET | Freq: Once | ORAL | Status: AC
  Administered 2016-10-30: 40 meq via ORAL
  Filled 2016-10-30: qty 2

## 2016-10-30 MED ORDER — MAGNESIUM SULFATE 2 GM/50ML IV SOLN
2.0000 g | Freq: Once | INTRAVENOUS | Status: AC
  Administered 2016-10-30: 2 g via INTRAVENOUS
  Filled 2016-10-30: qty 50

## 2016-10-30 MED ORDER — ASPIRIN EC 81 MG PO TBEC
81.0000 mg | DELAYED_RELEASE_TABLET | Freq: Every day | ORAL | Status: DC
  Administered 2016-10-30 – 2016-10-31 (×2): 81 mg via ORAL
  Filled 2016-10-30 (×2): qty 1

## 2016-10-30 NOTE — NC FL2 (Signed)
[place] LEVEL OF CARE SCREENING TOOL     IDENTIFICATION  Patient Name: William Kennedy Birthdate: 1940-08-30 Sex: male Admission Date (Current Location): 10/29/2016  [place] and [place] Number:  Engineering geologist and Address:  [facility], [address], [place],  [address]      Provider Number: [id number]  Attending Physician Name and Address:  Loletha Grayer, MD  Relative Name and Phone Number:       Current Level of Care: Hospital Recommended Level of Care: [place] Prior Approval Number:    Date Approved/Denied:   PASRR Number: [id number]  Discharge Plan: SNF    Current Diagnoses: Patient Active Problem List   Diagnosis Date Noted  . Cellulitis and abscess of trunk 10/29/2016    Orientation RESPIRATION BLADDER Height & Weight     Self, Time, Situation  Normal Continent Weight: 270 lb (122.5 kg) Height:  6\' 2"  (188 cm)  BEHAVIORAL SYMPTOMS/MOOD NEUROLOGICAL BOWEL NUTRITION STATUS      Continent Diet (Heart Healthy)  AMBULATORY STATUS COMMUNICATION OF NEEDS Skin   Extensive Assist Verbally Other (Comment), Surgical wounds ( 2 Penrose drains in his back for abscess drainage)                       Personal Care Assistance Level of Assistance  Bathing, Feeding, Dressing Bathing Assistance: Maximum assistance Feeding assistance: Independent Dressing Assistance: Maximum assistance     Functional Limitations Info             SPECIAL CARE FACTORS FREQUENCY  PT (By licensed PT)     PT Frequency: Up to 5X per day, 5 days per week              Contractures      Additional Factors Info                  Current Medications (10/30/2016):  This is the current hospital active medication list Current Facility-Administered Medications  Medication Dose Route Frequency Provider Last Rate Last Dose  . acetaminophen (TYLENOL) tablet 650 mg  650 mg Oral Q6H PRN Gouru, Aruna, MD       . aspirin EC tablet 81 mg  81 mg Oral Daily Paraschos, Alexander, MD   81 mg at 10/30/16 1216  . docusate sodium (COLACE) capsule 100 mg  100 mg Oral BID Gouru, Aruna, MD   100 mg at 10/30/16 0901  . enoxaparin (LOVENOX) injection 40 mg  40 mg Subcutaneous Q24H Gouru, Aruna, MD   40 mg at 10/29/16 2156  . HYDROcodone-acetaminophen (NORCO/VICODIN) 5-325 MG per tablet 1-2 tablet  1-2 tablet Oral QHS PRN Gouru, Aruna, MD      . metoprolol tartrate (LOPRESSOR) tablet 25 mg  25 mg Oral BID Paraschos, Alexander, MD   25 mg at 10/30/16 1216  . ondansetron (ZOFRAN) tablet 4 mg  4 mg Oral Q8H PRN Gouru, Aruna, MD      . piperacillin-tazobactam (ZOSYN) IVPB 3.375 g  3.375 g Intravenous Q8H Gouru, Aruna, MD 12.5 mL/hr at 10/30/16 1346 3.375 g at 10/30/16 1346  . traMADol (ULTRAM) tablet 50 mg  50 mg Oral Q6H PRN Gouru, Aruna, MD      . vancomycin (VANCOCIN) 1,250 mg in sodium chloride 0.9 % 250 mL IVPB  1,250 mg Intravenous Q12H Gouru, Aruna, MD 166.7 mL/hr at 10/30/16 1612 1,250 mg at 10/30/16 1612     Discharge Medications: Please see discharge summary for a list  of discharge medications.  Relevant Imaging Results:  Relevant Lab Results:   Additional Information SS#  Zettie Pho, LCSW

## 2016-10-30 NOTE — Progress Notes (Signed)
CCMD notified nurse pt 40 beat run of Vtach Dr. Earleen Newport made aware new orders received and documented. Pt. Denies any distress or discomfort. Cardiology consult made

## 2016-10-30 NOTE — Evaluation (Signed)
Physical Therapy Evaluation Patient Details Name: William Kennedy MRN: [id number] DOB: [birth year] Today's Date: 10/30/2016   History of Present Illness  76 yo male with onset of sepsis from abscess to back creating cellulitis, was drained by surgeon two weeks ago but now greater infection.  Pt has leukocytosis, acute kidney injury, tachycardia with MD clearing him for serious arrythmia.  Clinical Impression  Pt is up to side of bed with max assist and has trapeze bar on bed to help with his mobility.  Has been up to move with care to avoid his infection and pain.  He is giving a minimal effort but also reports pain in R knee since his fall from side of bed.  Will work with him as he tolerates but expect he will need to go to SNF for rehab due to loss of all independence with mobility.  Follow acutely for same goals, to work toward standing and transfers and progress toward gait as able.    Follow Up Recommendations SNF    Equipment Recommendations  None recommended by PT    Recommendations for Other Services       Precautions / Restrictions Precautions Precautions: Fall (telemetry) Restrictions Weight Bearing Restrictions: No      Mobility  Bed Mobility Overal bed mobility: Needs Assistance Bed Mobility: Supine to Sit;Sit to Supine     Supine to sit: Max assist Sit to supine: Mod assist   General bed mobility comments: using bedrail and assisted under trunk, pain on L shoulder with trunk assist  Transfers Overall transfer level: Needs assistance Equipment used: 1 person hand held assist Transfers: Sit to/from Stand Sit to Stand: Total assist         General transfer comment: pt is not able to assist to stand, no effort through his legs  Ambulation/Gait             General Gait Details: unable  Stairs            Wheelchair Mobility    Modified Rankin (Stroke Patients Only)       Balance Overall balance assessment: Needs assistance Sitting-balance  support: Feet supported Sitting balance-Leahy Scale: Good     Standing balance support: Single extremity supported   Standing balance comment: unable to stand                              Pertinent Vitals/Pain Pain Assessment: Faces Faces Pain Scale: Hurts even more Pain Location: L shoulder Pain Descriptors / Indicators: Sore Pain Intervention(s): Limited activity within patient's tolerance;Monitored during session;Premedicated before session;Repositioned    Home Living Family/patient expects to be discharged to:: Skilled nursing facility Living Arrangements: Spouse/significant other;Children Available Help at Discharge: Family;Available 24 hours/day Type of Home: House Home Access: Stairs to enter Entrance Stairs-Rails: Right;Left;Can reach both Entrance Stairs-Number of Steps: 2 Home Layout: One level Home Equipment: Walker - 2 wheels;Cane - single point Additional Comments: has been unable to walk since the surgery progressively until he slid off the bed Thursday and now also complains of R knee pain    Prior Function Level of Independence: Needs assistance   Gait / Transfers Assistance Needed: used RW or SPC in the house and to community  ADL's / Homemaking Assistance Needed: wife cares for the house        Hand Dominance        Extremity/Trunk Assessment   Upper Extremity Assessment Upper Extremity Assessment: Overall [facility] for  tasks assessed    Lower Extremity Assessment Lower Extremity Assessment: RLE deficits/detail RLE Deficits / Details: pain and weakness to extend R knee RLE: Unable to fully assess due to pain RLE Coordination: decreased gross motor    Cervical / Trunk Assessment Cervical / Trunk Assessment: Normal  Communication   Communication: No difficulties  Cognition Arousal/Alertness: Awake/alert Behavior During Therapy: WFL for tasks assessed/performed Overall Cognitive Status: Within Functional Limits for tasks assessed                                         General Comments      Exercises     Assessment/Plan    PT Assessment Patient needs continued PT services  PT Problem List Decreased strength;Decreased range of motion;Decreased activity tolerance;Decreased balance;Decreased mobility;Decreased coordination;Decreased safety awareness;Cardiopulmonary status limiting activity;Obesity;Decreased skin integrity;Pain       PT Treatment Interventions DME instruction;Gait training;Stair training;Functional mobility training;Therapeutic activities;Therapeutic exercise;Balance training;Neuromuscular re-education;Patient/family education    PT Goals (Current goals can be found in the Care Plan section)  Acute Rehab PT Goals Patient Stated Goal: to get home and progress with therapy PT Goal Formulation: With patient/family Time For Goal Achievement: 11/13/16 Potential to Achieve Goals: Good    Frequency Min 2X/week   Barriers to discharge Inaccessible home environment;Decreased caregiver support wife cannot assist him due to magnitude of help needed    Co-evaluation               AM-PAC PT "6 Clicks" Daily Activity  Outcome Measure Difficulty turning over in bed (including adjusting bedclothes, sheets and blankets)?: Total Difficulty moving from lying on back to sitting on the side of the bed? : Total Difficulty sitting down on and standing up from a chair with arms (e.g., wheelchair, bedside commode, etc,.)?: Total Help needed moving to and from a bed to chair (including a wheelchair)?: Total Help needed walking in hospital room?: Total Help needed climbing 3-5 steps with a railing? : Total 6 Click Score: 6    End of Session Equipment Utilized During Treatment: Gait belt Activity Tolerance: Patient limited by pain Patient left: in bed;with call bell/phone within reach;with family/visitor present;with bed alarm set Nurse Communication: Mobility status PT Visit Diagnosis:  Muscle weakness (generalized) (M62.81);History of falling (Z91.81);Pain Pain - Right/Left: Right Pain - part of body: Knee    Time: 2707-8675 PT Time Calculation (min) (ACUTE ONLY): 35 min   Charges:   PT Evaluation $PT Eval Moderate Complexity: 1 Procedure PT Treatments $Therapeutic Activity: 8-22 mins   PT G Codes:   PT G-Codes **NOT FOR INPATIENT CLASS** Functional Assessment Tool Used: AM-PAC 6 Clicks Basic Mobility    Ramond Dial 10/30/2016, 2:50 PM   Mee Hives, PT MS Acute Rehab Dept. Number: [place] and [place]

## 2016-10-30 NOTE — Consult Note (Signed)
[facility] Cardiology  CARDIOLOGY CONSULT NOTE  Patient ID: William Kennedy MRN: [id number] DOB/AGE: 1941-04-21 76 y.o.  Admit date: 10/29/2016 Referring Physician Leslye Peer Primary Physician [facility] Primary Cardiologist  Reason for Consultation Wide complex tachycardia  HPI: 76 year old gentleman referred for evaluation of nonsustained wide complex tachycardia. The patient has known history of CLL. Approximately 2 weeks ago he underwent incision and drainage of large abscess of left upper back: Insertion of 2 Penrose drains. The patient presented to [facility] emergency room last evening via EMS, after feeling extremely weak, legs giving way, sliding to the floor, and was unable to back to bed. Admission labs were notable for white count of 41,000. The patient was started on wide spectrum antibiotics for sepsis. Telemetry has revealed intermittent episodes of wide complex tachycardia, which is nonsustained, irregular, without change in axis, most consistent with atrial fibrillation with aberrancy. The patient denies chest pain, shortness of breath, palpitations or tachycardia. He has no prior history of presyncope or syncope.  Review of systems complete and found to be negative unless listed above     Past Medical History:  Diagnosis Date  . Leukemia [facility])     Past Surgical History:  Procedure Laterality Date  . BACK SURGERY      Prescriptions Prior to Admission  Medication Sig Dispense Refill Last Dose  . acetaminophen (TYLENOL) 325 MG tablet Take 650 mg by mouth every 4 (four) hours as needed for pain.   PRN at PRN  . amoxicillin-clavulanate (AUGMENTIN) 875-125 MG tablet Take 1 tablet by mouth 2 (two) times daily.   10/28/2016 at Unknown time  . HYDROcodone-acetaminophen (NORCO/VICODIN) 5-325 MG tablet Take 1-2 tablets by mouth at bedtime as needed for pain.   PRN at PRN  . ondansetron (ZOFRAN) 4 MG tablet Take 4 mg by mouth every 8 (eight) hours as needed for nausea.   PRN at PRN  . traMADol (ULTRAM)  50 MG tablet Take 50 mg by mouth every 6 (six) hours as needed for pain.   PRN at PRN   Social History   Social History  . Marital status: Married    Spouse name: N/A  . Number of children: N/A  . Years of education: N/A   Occupational History  . Not on file.   Social History Main Topics  . Smoking status: Former Research scientist (life sciences)  . Smokeless tobacco: Former Systems developer  . Alcohol use No  . Drug use: No  . Sexual activity: Not on file   Other Topics Concern  . Not on file   Social History Narrative  . No narrative on file    Family History  Problem Relation Age of Onset  . Diabetes Mother   . Cancer Father       Review of systems complete and found to be negative unless listed above      PHYSICAL EXAM  General: Well developed, well nourished, in no acute distress HEENT:  Normocephalic and atramatic Neck:  No JVD.  Lungs: Clear bilaterally to auscultation and percussion. Heart: HRRR . Normal S1 and S2 without gallops or murmurs.  Abdomen: Bowel sounds are positive, abdomen soft and non-tender  Msk:  Back normal, normal gait. Normal strength and tone for age. Extremities: No clubbing, cyanosis or edema.   Neuro: Alert and oriented X 3. Psych:  Good affect, responds appropriately  Labs:   Lab Results  Component Value Date   WBC 31.7 (H) 10/30/2016   HGB 15.2 10/30/2016   HCT 45.1 10/30/2016   MCV 93.1  10/30/2016   PLT 325 10/30/2016    Recent Labs Lab 10/30/16 0418  NA 136  K 3.5  CL 99*  CO2 26  BUN 24*  CREATININE 1.23  CALCIUM 9.0  PROT 6.4*  BILITOT 1.0  ALKPHOS 56  ALT 54  AST 58*  GLUCOSE 96   Lab Results  Component Value Date   CKTOTAL 320 10/30/2016   No results found for: CHOL No results found for: HDL No results found for: LDLCALC No results found for: TRIG No results found for: CHOLHDL No results found for: LDLDIRECT    Radiology: Dg Chest 1 View  Result Date: 10/29/2016 CLINICAL DATA:  Pain after fall EXAM: CHEST 1 VIEW COMPARISON:   None. FINDINGS: The heart size and mediastinal contours are within normal limits. Both lungs are clear. The visualized skeletal structures are unremarkable. IMPRESSION: No active disease. Electronically Signed   By: Dorise Bullion III M.D   On: 10/29/2016 11:05   Dg Knee Complete 4 Views Right  Result Date: 10/29/2016 CLINICAL DATA:  Pain after fall. EXAM: RIGHT KNEE - COMPLETE 4+ VIEW COMPARISON:  None. FINDINGS: No evidence of fracture, dislocation, or joint effusion. No evidence of arthropathy or other focal bone abnormality. Soft tissues are unremarkable. IMPRESSION: Negative. Electronically Signed   By: Dorise Bullion III M.D   On: 10/29/2016 11:04    EKG: Sinus rhythm  ASSESSMENT AND PLAN:   1. Wide-complex tachycardia, irregularly irregular rhythm, most consistent with atrial fibrillation with aberrancy, in the setting of sepsis, asymptomatic, without chest pain or palpitations 2. Sepsis, with draining abscess  Recommendations  1. Continue current therapy 2. Add low-dose metoprolol tartrate 3. Review 2-D echocardiogram 4. Defer anticoagulation at this time 5. Start low-dose aspirin  Signed: Isaias Cowman MD,PhD, [facility] 10/30/2016, 11:25 AM

## 2016-10-30 NOTE — Progress Notes (Signed)
Patient had a 20 beat run of Vtach. Patient found sleeping. MD notified. Labs ordered.

## 2016-10-30 NOTE — Progress Notes (Signed)
Spoke with Dr. Saralyn Pilar in regards to CCMD request for parameters for pt. Runs of V-tach. MD states pt. Is not having Vtach and should continuing monitoring and call for any arrhthymias sustaining. CCMD tele tech notified.

## 2016-10-30 NOTE — Progress Notes (Signed)
Patient ID: William Kennedy, male   DOB: 05-08-1941, 76 y.o.   MRN: [id number]  [employer] PROGRESS NOTE  ISABELLA IDA GEX:[id number] DOB: 09-Jun-1940 DOA: 10/29/2016 PCP: [name], MD  HPI/Subjective: Patient feeling okay. He has 2 Penrose drains in his back for abscess drainage. Feels okay. Nursing staff called me with 2 episodes of nonsustained ventricular tachycardia  Objective: Vitals:   10/30/16 0737 10/30/16 1422  BP: (!) 138/43 132/65  Pulse: 83 84  Resp:    Temp: 98.7 F (37.1 C) 99.7 F (37.6 C)    Filed Weights   10/29/16 0957  Weight: 122.5 kg (270 lb)    ROS: Review of Systems  Constitutional: Negative for chills and fever.  Eyes: Negative for blurred vision.  Respiratory: Negative for cough and shortness of breath.   Cardiovascular: Negative for chest pain.  Gastrointestinal: Negative for abdominal pain, constipation, diarrhea, nausea and vomiting.  Genitourinary: Negative for dysuria.  Musculoskeletal: Negative for joint pain.  Neurological: Negative for dizziness and headaches.   Exam: Physical Exam  Constitutional: He is oriented to person, place, and time.  HENT:  Nose: No mucosal edema.  Mouth/Throat: No oropharyngeal exudate or posterior oropharyngeal edema.  Eyes: Conjunctivae, EOM and lids are normal. Pupils are equal, round, and reactive to light.  Neck: No JVD present. Carotid bruit is not present. No edema present. No thyroid mass and no thyromegaly present.  Cardiovascular: Regular rhythm, S1 normal and S2 normal.  Exam reveals no gallop.   No murmur heard. Pulses:      Dorsalis pedis pulses are 2+ on the right side, and 2+ on the left side.  Respiratory: No respiratory distress. He has decreased breath sounds in the right lower field and the left lower field. He has no wheezes. He has no rhonchi. He has no rales.  GI: Soft. Bowel sounds are normal. There is no tenderness.  Musculoskeletal:       Right ankle: He exhibits  swelling.       Left ankle: He exhibits swelling.  Lymphadenopathy:    He has no cervical adenopathy.  Neurological: He is alert and oriented to person, place, and time. No cranial nerve deficit.  Skin: Skin is warm. No rash noted. Nails show no clubbing.  Left upper back abscess with surrounding erythema. When I turned him over, a large amount of pus started draining out on the bandage. I was able to squeeze out quite a bit of material.  Psychiatric: He has a normal mood and affect.      Data Reviewed: Basic Metabolic Panel:  Recent Labs Lab 10/29/16 1039 10/30/16 0418  NA 134* 136  K 3.8 3.5  CL 96* 99*  CO2 25 26  GLUCOSE 118* 96  BUN 34* 24*  CREATININE 1.65* 1.23  CALCIUM 9.6 9.0  MG  --  1.9   Liver Function Tests:  Recent Labs Lab 10/29/16 1039 10/30/16 0418  AST 80* 58*  ALT 55 54  ALKPHOS 62 56  BILITOT 1.0 1.0  PROT 6.9 6.4*  ALBUMIN 3.5 3.1*   CBC:  Recent Labs Lab 10/29/16 1039 10/30/16 0418  WBC 41.2* 31.7*  NEUTROABS 21.0*  --   HGB 15.3 15.2  HCT 45.4 45.1  MCV 91.3 93.1  PLT 368 325   Cardiac Enzymes:  Recent Labs Lab 10/29/16 1039 10/30/16 0418  CKTOTAL 1,184* 320     Recent Results (from the past 240 hour(s))  Blood Culture (routine x 2)  Status: None (Preliminary result)   Collection Time: 10/29/16 12:41 PM  Result Value Ref Range Status   Specimen Description BLOOD RIGHT HAND  Final   Special Requests   Final    BOTTLES DRAWN AEROBIC AND ANAEROBIC Blood Culture adequate volume   Culture NO GROWTH < 24 HOURS  Final   Report Status PENDING  Incomplete  Blood Culture (routine x 2)     Status: None (Preliminary result)   Collection Time: 10/29/16 12:41 PM  Result Value Ref Range Status   Specimen Description BLOOD RIGHT HAND  Final   Special Requests   Final    BOTTLES DRAWN AEROBIC AND ANAEROBIC Blood Culture adequate volume   Culture NO GROWTH < 24 HOURS  Final   Report Status PENDING  Incomplete  Aerobic/Anaerobic  Culture (surgical/deep wound)     Status: None (Preliminary result)   Collection Time: 10/29/16  1:11 PM  Result Value Ref Range Status   Specimen Description BACK upper middle  Final   Special Requests NONE  Final   Gram Stain   Final    NO WBC SEEN RARE GRAM POSITIVE COCCI IN PAIRS Performed at [facility], [address], [place], [place] [address]    Culture PENDING  Incomplete   Report Status PENDING  Incomplete     Studies: Dg Chest 1 View  Result Date: 10/29/2016 CLINICAL DATA:  Pain after fall EXAM: CHEST 1 VIEW COMPARISON:  None. FINDINGS: The heart size and mediastinal contours are within normal limits. Both lungs are clear. The visualized skeletal structures are unremarkable. IMPRESSION: No active disease. Electronically Signed   By: Dorise Bullion III M.D   On: 10/29/2016 11:05   Dg Knee Complete 4 Views Right  Result Date: 10/29/2016 CLINICAL DATA:  Pain after fall. EXAM: RIGHT KNEE - COMPLETE 4+ VIEW COMPARISON:  None. FINDINGS: No evidence of fracture, dislocation, or joint effusion. No evidence of arthropathy or other focal bone abnormality. Soft tissues are unremarkable. IMPRESSION: Negative. Electronically Signed   By: Dorise Bullion III M.D   On: 10/29/2016 11:04    Scheduled Meds: . aspirin EC  81 mg Oral Daily  . docusate sodium  100 mg Oral BID  . enoxaparin (LOVENOX) injection  40 mg Subcutaneous Q24H  . metoprolol tartrate  25 mg Oral BID   Continuous Infusions: . sodium chloride 75 mL/hr at 10/30/16 0728  . piperacillin-tazobactam (ZOSYN)  IV 3.375 g (10/30/16 1346)  . vancomycin 1,250 mg (10/30/16 0500)    Assessment/Plan:  1. Clinical sepsis with left back abscess, leukocytosis and tachycardia. Patient on vancomycin and Zosyn. I sent off another culture. 2. Acute rhabdomyolysis. IV fluid hydration. This could also be elevated with recent placement of Penrose drains. 3. Wide complex tachycardia nonsustained. Cardiology thinks this could be  paroxysmal atrial fibrillation with aberrancy. Patient started on aspirin and metoprolol. Echocardiogram ordered. Electrolytes replaced. 4. Hypomagnesemia. Replace magnesium IV 5. Hypokalemia replace potassium orally. 6. History of CLL with chronically elevated white count  Code Status:     Code Status Orders        Start     Ordered   10/29/16 1526  Full code  Continuous     10/29/16 1525    Code Status History    Date Active Date Inactive Code Status Order ID Comments User Context   This patient has a current code status but no historical code status.     Family Communication: Family at the bedside Disposition Plan: Likely IV  antibiotics through the weekend  Antibiotics:  Vancomycin  Zosyn  Time spent: 28 minutes  Loletha Grayer  [employer]

## 2016-10-31 ENCOUNTER — Inpatient Hospital Stay: Payer: BLUE CROSS/BLUE SHIELD

## 2016-10-31 ENCOUNTER — Inpatient Hospital Stay
Admit: 2016-10-31 | Discharge: 2016-10-31 | Disposition: A | Discharge status: Home / Self Care | Payer: BLUE CROSS/BLUE SHIELD | Attending: Internal Medicine | Admitting: Internal Medicine

## 2016-10-31 DIAGNOSIS — K5730 Diverticulosis of large intestine without perforation or abscess without bleeding: Secondary | ICD-10-CM

## 2016-10-31 DIAGNOSIS — N261 Atrophy of kidney (terminal): Secondary | ICD-10-CM

## 2016-10-31 DIAGNOSIS — N132 Hydronephrosis with renal and ureteral calculous obstruction: Secondary | ICD-10-CM

## 2016-10-31 LAB — BASIC METABOLIC PANEL
Anion gap: 5 (ref 5–15)
BUN: 21 mg/dL — ABNORMAL HIGH (ref 6–20)
CALCIUM: 8.9 mg/dL (ref 8.9–10.3)
CHLORIDE: 104 mmol/L (ref 101–111)
CO2: 30 mmol/L (ref 22–32)
CREATININE: 1.26 mg/dL — AB (ref 0.61–1.24)
GFR calc non Af Amer: 54 mL/min — ABNORMAL LOW (ref >60)
Glucose, Bld: 103 mg/dL — ABNORMAL HIGH (ref 65–99)
Potassium: 3.7 mmol/L (ref 3.5–5.1)
Sodium: 139 mmol/L (ref 135–145)

## 2016-10-31 LAB — VANCOMYCIN, TROUGH: VANCOMYCIN TR: 17 ug/mL (ref 15–20)

## 2016-10-31 LAB — CBC
HEMATOCRIT: 42.9 % (ref 40.0–52.0)
HEMOGLOBIN: 14.2 g/dL (ref 13.0–18.0)
MCH: 31 pg (ref 26.0–34.0)
MCHC: 33.1 g/dL (ref 32.0–36.0)
MCV: 93.6 fL (ref 80.0–100.0)
Platelets: 335 10*3/uL (ref 150–440)
RBC: 4.59 MIL/uL (ref 4.40–5.90)
RDW: 13.3 % (ref 11.5–14.5)
WBC: 27.7 10*3/uL — ABNORMAL HIGH (ref 3.8–10.6)

## 2016-10-31 LAB — ECHOCARDIOGRAM COMPLETE
Height: 74 in
WEIGHTICAEL: 4320 [oz_av]

## 2016-10-31 MED ORDER — TAMSULOSIN HCL 0.4 MG PO CAPS
0.4000 mg | ORAL_CAPSULE | Freq: Every day | ORAL | Status: DC
  Administered 2016-10-31 – 2016-11-09 (×9): 0.4 mg via ORAL
  Filled 2016-10-31 (×10): qty 1

## 2016-10-31 MED ORDER — MORPHINE SULFATE (PF) 2 MG/ML IV SOLN: 2.0000 mg | INTRAVENOUS | Status: DC | PRN

## 2016-10-31 MED ORDER — HYDROCODONE-ACETAMINOPHEN 5-325 MG PO TABS
1.0000 | ORAL_TABLET | ORAL | Status: DC | PRN
  Administered 2016-10-31 – 2016-11-06 (×10): 1 via ORAL
  Filled 2016-10-31 (×10): qty 1

## 2016-10-31 MED ORDER — SODIUM CHLORIDE 0.9 % IV SOLN
INTRAVENOUS | Status: DC
  Administered 2016-10-31 – 2016-11-06 (×4): via INTRAVENOUS

## 2016-10-31 NOTE — Progress Notes (Signed)
Tele called to notify that pt had 12 beat wide QRS run. MD [place] notified. No new orders.

## 2016-10-31 NOTE — Consult Note (Signed)
Reason for Consult:Diverticulitis w/ focal perforation.- Referring Physician: Cordelia Poche, MD  William Kennedy is an 76 y.o. male.  HPI: Diverticulitis noted on CT obtained today for suspected kidney stone (past history of same presenting with LLQ discomfort.). Patient reports decreased appetite since back abscess developed, no good BM for a week.  Admitted here for IV antibiotics for large left back abscess s/p I&D by Rochel Brome, MD. Reports he began to notice LLQ discomfort the evening of June 6th, worse last night.  No nausea/ vomiting.  No past history of diverticulitis. No history of steroid use. Decreased ambulation since February secondary to back pain. S/P epidural steroids in the past.   Past Medical History:  Diagnosis Date  . Leukemia [facility])     Past Surgical History:  Procedure Laterality Date  . BACK SURGERY      Family History  Problem Relation Age of Onset  . Diabetes Mother   . Cancer Father     Social History:  reports that he has quit smoking. He has quit using smokeless tobacco. He reports that he does not drink alcohol or use drugs.  Allergies: No Known Allergies  Medications: I have reviewed the patient's current medications.  Results for orders placed or performed during the hospital encounter of 10/29/16 (from the past 48 hour(s))  Urinalysis, Routine w reflex microscopic     Status: Abnormal   Collection Time: 10/29/16 12:41 PM  Result Value Ref Range   Color, Urine YELLOW (A) YELLOW   APPearance HAZY (A) CLEAR   Specific Gravity, Urine 1.018 1.005 - 1.030   pH 5.0 5.0 - 8.0   Glucose, UA NEGATIVE NEGATIVE mg/dL   Hgb urine dipstick MODERATE (A) NEGATIVE   Bilirubin Urine NEGATIVE NEGATIVE   Ketones, ur NEGATIVE NEGATIVE mg/dL   Protein, ur NEGATIVE NEGATIVE mg/dL   Nitrite NEGATIVE NEGATIVE   Leukocytes, UA NEGATIVE NEGATIVE   RBC / HPF 6-30 0 - 5 RBC/hpf   WBC, UA 0-5 0 - 5 WBC/hpf   Bacteria, UA RARE (A) NONE SEEN   Squamous Epithelial  / LPF 0-5 (A) NONE SEEN   Uric Acid Crys, UA PRESENT   Lactic acid, plasma     Status: None   Collection Time: 10/29/16 12:41 PM  Result Value Ref Range   Lactic Acid, Venous 1.3 0.5 - 1.9 mmol/L  Blood Culture (routine x 2)     Status: None (Preliminary result)   Collection Time: 10/29/16 12:41 PM  Result Value Ref Range   Specimen Description BLOOD RIGHT HAND    Special Requests      BOTTLES DRAWN AEROBIC AND ANAEROBIC Blood Culture adequate volume   Culture NO GROWTH 2 DAYS    Report Status PENDING   Blood Culture (routine x 2)     Status: None (Preliminary result)   Collection Time: 10/29/16 12:41 PM  Result Value Ref Range   Specimen Description BLOOD RIGHT HAND    Special Requests      BOTTLES DRAWN AEROBIC AND ANAEROBIC Blood Culture adequate volume   Culture NO GROWTH 2 DAYS    Report Status PENDING   Aerobic/Anaerobic Culture (surgical/deep wound)     Status: None (Preliminary result)   Collection Time: 10/29/16  1:11 PM  Result Value Ref Range   Specimen Description BACK upper middle    Special Requests NONE    Gram Stain NO WBC SEEN RARE GRAM POSITIVE COCCI IN PAIRS     Culture      TOO YOUNG  TO READ Performed at [facility], [place] [address], [place], [place] [address]    Report Status PENDING   CBC     Status: Abnormal   Collection Time: 10/30/16  4:18 AM  Result Value Ref Range   WBC 31.7 (H) 3.8 - 10.6 K/uL   RBC 4.84 4.40 - 5.90 MIL/uL   Hemoglobin 15.2 13.0 - 18.0 g/dL   HCT 45.1 40.0 - 52.0 %   MCV 93.1 80.0 - 100.0 fL   MCH 31.3 26.0 - 34.0 pg   MCHC 33.7 32.0 - 36.0 g/dL   RDW 13.1 11.5 - 14.5 %   Platelets 325 150 - 440 K/uL  Comprehensive metabolic panel     Status: Abnormal   Collection Time: 10/30/16  4:18 AM  Result Value Ref Range   Sodium 136 135 - 145 mmol/L   Potassium 3.5 3.5 - 5.1 mmol/L   Chloride 99 (L) 101 - 111 mmol/L   CO2 26 22 - 32 mmol/L   Glucose, Bld 96 65 - 99 mg/dL   BUN 24 (H) 6 - 20 mg/dL   Creatinine, Ser 1.23  0.61 - 1.24 mg/dL   Calcium 9.0 8.9 - 10.3 mg/dL   Total Protein 6.4 (L) 6.5 - 8.1 g/dL   Albumin 3.1 (L) 3.5 - 5.0 g/dL   AST 58 (H) 15 - 41 U/L   ALT 54 17 - 63 U/L   Alkaline Phosphatase 56 38 - 126 U/L   Total Bilirubin 1.0 0.3 - 1.2 mg/dL   GFR calc non Af Amer 56 (L) >60 mL/min   GFR calc Af Amer >60 >60 mL/min    Comment: (NOTE) The eGFR has been calculated using the CKD EPI equation. This calculation has not been validated in all clinical situations. eGFR's persistently <60 mL/min signify possible Chronic Kidney Disease.    Anion gap 11 5 - 15  CK     Status: None   Collection Time: 10/30/16  4:18 AM  Result Value Ref Range   Total CK 320 49 - 397 U/L  Magnesium     Status: None   Collection Time: 10/30/16  4:18 AM  Result Value Ref Range   Magnesium 1.9 1.7 - 2.4 mg/dL  Vancomycin, trough     Status: None   Collection Time: 10/31/16  4:24 AM  Result Value Ref Range   Vancomycin Tr 17 15 - 20 ug/mL  CBC     Status: Abnormal   Collection Time: 10/31/16  4:24 AM  Result Value Ref Range   WBC 27.7 (H) 3.8 - 10.6 K/uL   RBC 4.59 4.40 - 5.90 MIL/uL   Hemoglobin 14.2 13.0 - 18.0 g/dL   HCT 42.9 40.0 - 52.0 %   MCV 93.6 80.0 - 100.0 fL   MCH 31.0 26.0 - 34.0 pg   MCHC 33.1 32.0 - 36.0 g/dL   RDW 13.3 11.5 - 14.5 %   Platelets 335 150 - 440 K/uL  Basic metabolic panel     Status: Abnormal   Collection Time: 10/31/16  4:24 AM  Result Value Ref Range   Sodium 139 135 - 145 mmol/L   Potassium 3.7 3.5 - 5.1 mmol/L   Chloride 104 101 - 111 mmol/L   CO2 30 22 - 32 mmol/L   Glucose, Bld 103 (H) 65 - 99 mg/dL   BUN 21 (H) 6 - 20 mg/dL   Creatinine, Ser 1.26 (H) 0.61 - 1.24 mg/dL   Calcium 8.9 8.9 - 10.3  mg/dL   GFR calc non Af Amer 54 (L) >60 mL/min   GFR calc Af Amer >60 >60 mL/min    Comment: (NOTE) The eGFR has been calculated using the CKD EPI equation. This calculation has not been validated in all clinical situations. eGFR's persistently <60 mL/min signify  possible Chronic Kidney Disease.    Anion gap 5 5 - 15    Ct Renal Stone Study  Result Date: 10/31/2016 CLINICAL DATA:  Left flank/left lower quadrant abdominal pain EXAM: CT ABDOMEN AND PELVIS WITHOUT CONTRAST TECHNIQUE: Multidetector CT imaging of the abdomen and pelvis was performed following the standard protocol without IV contrast. COMPARISON:  08/21/2009 FINDINGS: Lower chest: Lung bases are clear. Hepatobiliary: Unenhanced liver is unremarkable. Gallbladder is unremarkable. No intrahepatic or extrahepatic ductal dilatation. Pancreas: Within normal limits. Spleen: Within normal limits. Adrenals/Urinary Tract: 6.3 cm right renal cyst with rim calcification (series 2/ image 25), unchanged. Mild thickening of the left adrenal gland, unchanged. 10.3 cm posterior right upper pole renal cyst (series 2/ image 37), previously 8.0 cm. No renal calculi or hydronephrosis. 1.7 cm left upper pole renal cyst (series 2/ image 37). Two 3 mm nonobstructing left upper pole renal calculi. Mild left hydroureteronephrosis. Associated 6 mm distal left ureteral calculus (coronal image 102). Bladder is mildly thick-walled although underdistended. Stomach/Bowel: Stomach is within normal limits. No evidence bowel obstruction. Normal appendix (series 2/ image 60). Sigmoid diverticulosis with associated diverticulitis and a 4.4 x 2.8 cm pericolonic fluid/ gas collection (series 2/ image 73), predominantly gas. This does not reflect a drainable fluid collection/abscess. No free air. Vascular/Lymphatic: No evidence of abdominal aortic aneurysm. Atherosclerotic calcifications of the abdominal aorta and branch vessels. Small retroperitoneal lymph nodes, including an 8 mm short axis left common iliac node (series 2/ image 56), previously 7 mm in 2011. Reproductive: Marked prostatomegaly. Other: No abdominopelvic ascites. Musculoskeletal: Degenerative changes of the visualized thoracolumbar spine, most prominent at L4-5. IMPRESSION: 6  mm distal left ureteral calculus with associated mild left hydroureteronephrosis. Two additional nonobstructing left upper pole renal calculi measuring up to 3 mm. Acute sigmoid diverticulitis, with adjacent 4.4 x 2.8 cm pericolonic fluid/ gas collection, predominantly gas. This does not reflect a drainable fluid collection/ abscess given the size and lack of fluid component. No free air. Additional ancillary findings, as above. Electronically Signed   By: Julian Hy M.D.   On: 10/31/2016 11:28    Review of Systems  Constitutional: Negative.   HENT: Negative.   Eyes: Negative.   Respiratory: Negative.   Cardiovascular: Negative.   Gastrointestinal: Positive for constipation.  Genitourinary: Positive for flank pain.  Skin: Negative.   Neurological: Negative.   Endo/Heme/Allergies: Negative.   Psychiatric/Behavioral: Negative.  The patient does not have insomnia.    Blood pressure 128/90, pulse 84, temperature 98.9 F (37.2 C), temperature source Oral, resp. rate 18, height 6' 2"  (1.88 m), weight 270 lb (122.5 kg), SpO2 100 %. Physical Exam  Constitutional: He appears well-developed and well-nourished.  HENT:  Head: Normocephalic.  Cardiovascular: Normal rate and regular rhythm.   Respiratory: Effort normal and breath sounds normal.  GI: Normal appearance. Bowel sounds are decreased. There is no hepatosplenomegaly. There is no CVA tenderness.    Lymphadenopathy:       Right: No inguinal adenopathy present.       Left: No inguinal adenopathy present.   WBC down from 41K. (HX CLL)  WBC 12/05/2013: 30,600.    Assessment/Plan: Diverticulitis with contained mesenteric perforation.  NPO until after urology  evaluation. If urgent stone extraction not required, would hold NPO except ice chips until AM.   Robert Bellow 10/31/2016, 12:27 PM

## 2016-10-31 NOTE — Consult Note (Signed)
I have been asked to see the patient by Dr. Loletha Grayer, for evaluation and management of left mid/distal 55mm obstructing stone.  History of present illness: 47M who presented to the ED with worsening back cellulitis/abscess which was drained on 5/18.  He was admitted and placed on broad spectrum abx.  He was complaining of left lower quadrant pain, new onset, this AM.  Work-up revealed a left mid/distal ureteral stone with mild proximal hydronephrosis.  He also was found to have a sigmond mini-perf/abscess from diverticulitis.  His UA at the time of admission was unconcerning save microscopic hematuria.  His WBC is elevated, but is chronically elevated due to a history of CLL.  The patient states that he has had a descent appetite and was able to eat dinner.  He skipped breakfast and only drank some coffee and sprite.  Currently, the patient is complaining of left lower quadrant pain, states that this feels as if this is similar pain to when he had a stone several years prior. Then, he was a patient of Dr. Eliberto Ivory, underwent shockwave lithotripsy and ultimately was able to pass the fragments. The patient denies any progression of his lower urinary tract symptoms, although he does state that he is incontinent, and has been incontinent for some time. He denies any fevers or chills. He denies any associated nausea or vomiting. He's not had a bowel movement for 5 days it was of "any significance". He denies any dysuria or gross hematuria.  The patient's wife states that he is weak, and is unable to ambulate and is current situation.  The patient is agitated, and is adamant about not wanting any intervention currently.   Review of systems: A 12 point comprehensive review of systems was obtained and is negative unless otherwise stated in the history of present illness.  Patient Active Problem List   Diagnosis Date Noted  . Cellulitis and abscess of trunk 10/29/2016    No current facility-administered  medications on file prior to encounter.    No current outpatient prescriptions on file prior to encounter.    Past Medical History:  Diagnosis Date  . Leukemia [facility])     Past Surgical History:  Procedure Laterality Date  . BACK SURGERY      Social History  Substance Use Topics  . Smoking status: Former Research scientist (life sciences)  . Smokeless tobacco: Former Systems developer  . Alcohol use No    Family History  Problem Relation Age of Onset  . Diabetes Mother   . Cancer Father     PE: Vitals:   10/30/16 1422 10/30/16 1936 10/30/16 2357 10/31/16 0831  BP: 132/65 (!) 131/47 112/89 128/90  Pulse: 84 91 84   Resp:  18 19 18   Temp: 99.7 F (37.6 C) 98.9 F (37.2 C) 98.9 F (37.2 C)   TempSrc: Oral Oral Oral Oral  SpO2: 94% 96% 94% 100%  Weight:      Height:       Patient appears to be in no acute distress  patient is alert and oriented x3 Atraumatic normocephalic head No cervical or supraclavicular lymphadenopathy appreciated No increased work of breathing, no audible wheezes/rhonchi Regular sinus rhythm/rate Abdomen is tender to palpation in the left lower quadrant and inguinal region. He also has left CVA tenderness. Lower extremities are symmetric without appreciable edema Grossly neurologically intact No identifiable skin lesions   Recent Labs  10/29/16 1039 10/30/16 0418  WBC 41.2* 31.7*  HGB 15.3 15.2  HCT 45.4 45.1    Recent  Labs  10/29/16 1039 10/30/16 0418  NA 134* 136  K 3.8 3.5  CL 96* 99*  CO2 25 26  GLUCOSE 118* 96  BUN 34* 24*  CREATININE 1.65* 1.23  CALCIUM 9.6 9.0   No results for input(s): LABPT, INR in the last 72 hours. No results for input(s): LABURIN in the last 72 hours. Results for orders placed or performed during the hospital encounter of 10/29/16  Blood Culture (routine x 2)     Status: None (Preliminary result)   Collection Time: 10/29/16 12:41 PM  Result Value Ref Range Status   Specimen Description BLOOD RIGHT HAND  Final   Special Requests    Final    BOTTLES DRAWN AEROBIC AND ANAEROBIC Blood Culture adequate volume   Culture NO GROWTH 2 DAYS  Final   Report Status PENDING  Incomplete  Blood Culture (routine x 2)     Status: None (Preliminary result)   Collection Time: 10/29/16 12:41 PM  Result Value Ref Range Status   Specimen Description BLOOD RIGHT HAND  Final   Special Requests   Final    BOTTLES DRAWN AEROBIC AND ANAEROBIC Blood Culture adequate volume   Culture NO GROWTH 2 DAYS  Final   Report Status PENDING  Incomplete  Aerobic/Anaerobic Culture (surgical/deep wound)     Status: None (Preliminary result)   Collection Time: 10/29/16  1:11 PM  Result Value Ref Range Status   Specimen Description BACK upper middle  Final   Special Requests NONE  Final   Gram Stain NO WBC SEEN RARE GRAM POSITIVE COCCI IN PAIRS   Final   Culture   Final    TOO YOUNG TO READ Performed at [facility], [place] [address] [place], [place] [address]    Report Status PENDING  Incomplete    Imaging: I have independently reviewed the patient's CT scan which demonstrates a 6 mm distal left ureteral stone with mild proximal hydroureteronephrosis. The patient also has some nonobstructing left-sided stones. 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 He also has complex right cyst. He has bilateral simple cysts. He has thick walled bladder although decompressed and prostatomegally.  Imp: Left distal ureteral stone measuring 6 mm in size with proximal hydroureteronephrosis. The patient has no evidence of infection within the urinary tract. His pain is reasonably well controlled. I discussed treatment options with the patient including conservative management, ureteroscopy and stone removal, and stent placement with shockwave lithotripsy. The patient today is quite adamant that he would like to wait on any intervention today.   Recommendations: Given his refusal intervention today, we will proceed with medical expulsion therapy. The patient was  started on tamsulosin earlier today which we will continue. We will make him nothing by mouth past midnight and reevaluate him in the morning. If the patient chooses shockwave lithotripsy, which I get this scheduled for him on Thursday. At that point, the patient will need Lovenox held on Wednesday, and any Toradol stopped after Monday. We have stopped his aspirin (81 mg) today. This should not be a problem for a distal stone.   Thank you for involving me in this patient's care, we will continue to follow along.  Louis Meckel W

## 2016-10-31 NOTE — Clinical Social Work Note (Signed)
Clinical Social Work Assessment  Patient Details  Name: William Kennedy MRN: [id number] Date of Birth: [birth year]  Date of referral:  10/31/16               Reason for consult:                   Permission sought to share information with:  Facility Art therapist granted to share information::  Yes, Verbal Permission Granted  Name::        Agency::     Relationship::     Contact Information:     Housing/Transportation Living arrangements for the past 2 months:  [place] of Information:  Patient, Spouse Patient Interpreter Needed:  None Criminal Activity/Legal Involvement Pertinent to Current Situation/Hospitalization:  No - Comment as needed Significant Relationships:  Adult Children, Spouse Lives with:  Spouse Do you feel safe going back to the place where you live?  Yes Need for family participation in patient care:  No (Coment)  Care giving concerns:  PT recommendation for SNF   Social Worker assessment / plan:  CSW met with the patient and his wife at bedside to discuss discharge planning and to answer any concerns. The patient gave verbal permission for the CSW to make the referral to SNFs, and he reported that he would prefer [facility] as his choice. The patient's wife asked appropriate questions about visitation, ratings of facilities, and location of facilities.  [facility] was accepted and selected in the [place]. DC is unknown.  Employment status:  Retired Forensic scientist:  [employer] PT Recommendations:  [place] / Referral to community resources:  [place]  Patient/Family's Response to care:  The family thanked the CSW for assistance.  Patient/Family's Understanding of and Emotional Response to Diagnosis, Current Treatment, and Prognosis:  The patient and family understand the need for SNF level of care and are in agreement.   Emotional Assessment Appearance:  Appears stated  age Attitude/Demeanor/Rapport:   (Pleasant) Affect (typically observed):  Pleasant, Accepting, Appropriate Orientation:  Oriented to Self, Oriented to Place, Oriented to  Time, Oriented to Situation Alcohol / Substance use:  Never Used Psych involvement (Current and /or in the community):  No (Comment)  Discharge Needs  Concerns to be addressed:  Care Coordination, Discharge Planning Concerns Readmission within the last 30 days:  No Current discharge risk:  None Barriers to Discharge:  Continued Medical Work up   [employer], LCSW 10/31/2016, 3:48 PM

## 2016-10-31 NOTE — Progress Notes (Signed)
Patient ID: William Kennedy, male   DOB: [birth year], 76 y.o.   MRN: [id number]  [facility] PROGRESS NOTE  William Kennedy KVQ:[id number] DOB: 1940-07-26 DOA: 10/29/2016 PCP: [name], MD  HPI/Subjective: Patient having left lower quadrant abdominal pain. Nursing staff notified me when I came on the floor. I asked the nurse to do a bladder scan which was negative. Patient states he hasn't had a bowel movement in 2 days. He states it feels like a kidney stone.  Pain radiates around the left flank. He states he's been urinating okay.  Objective: Vitals:   10/30/16 2357 10/31/16 0831  BP: 112/89 128/90  Pulse: 84   Resp: 19 18  Temp: 98.9 F (37.2 C)     Filed Weights   10/29/16 0957  Weight: 122.5 kg (270 lb)    ROS: Review of Systems  Constitutional: Negative for chills and fever.  Eyes: Negative for blurred vision.  Respiratory: Negative for cough and shortness of breath.   Cardiovascular: Negative for chest pain.  Gastrointestinal: Positive for abdominal pain and constipation. Negative for diarrhea, nausea and vomiting.  Genitourinary: Positive for flank pain. Negative for dysuria.  Musculoskeletal: Negative for joint pain.  Neurological: Negative for dizziness and headaches.   Exam: Physical Exam  Constitutional: He is oriented to person, place, and time.  HENT:  Nose: No mucosal edema.  Mouth/Throat: No oropharyngeal exudate or posterior oropharyngeal edema.  Eyes: Conjunctivae, EOM and lids are normal. Pupils are equal, round, and reactive to light.  Neck: No JVD present. Carotid bruit is not present. No edema present. No thyroid mass and no thyromegaly present.  Cardiovascular: Regular rhythm, S1 normal and S2 normal.  Exam reveals no gallop.   No murmur heard. Pulses:      Dorsalis pedis pulses are 2+ on the right side, and 2+ on the left side.  Respiratory: No respiratory distress. He has decreased breath sounds in the right lower field and the left  lower field. He has no wheezes. He has no rhonchi. He has no rales.  GI: Soft. Bowel sounds are normal. He exhibits distension. There is tenderness in the left lower quadrant.  Musculoskeletal:       Right ankle: He exhibits swelling.       Left ankle: He exhibits swelling.  Lymphadenopathy:    He has no cervical adenopathy.  Neurological: He is alert and oriented to person, place, and time. No cranial nerve deficit.  Skin: Skin is warm. No rash noted. Nails show no clubbing.  Left upper back abscess with surrounding erythema. When I turned him over, a large amount of pus started draining out on the bandage. I was able to squeeze out quite a bit of material.  Psychiatric: He has a normal mood and affect.      Data Reviewed: Basic Metabolic Panel:  Recent Labs Lab 10/29/16 1039 10/30/16 0418  NA 134* 136  K 3.8 3.5  CL 96* 99*  CO2 25 26  GLUCOSE 118* 96  BUN 34* 24*  CREATININE 1.65* 1.23  CALCIUM 9.6 9.0  MG  --  1.9   Liver Function Tests:  Recent Labs Lab 10/29/16 1039 10/30/16 0418  AST 80* 58*  ALT 55 54  ALKPHOS 62 56  BILITOT 1.0 1.0  PROT 6.9 6.4*  ALBUMIN 3.5 3.1*   CBC:  Recent Labs Lab 10/29/16 1039 10/30/16 0418  WBC 41.2* 31.7*  NEUTROABS 21.0*  --   HGB 15.3 15.2  HCT 45.4 45.1  MCV 91.3 93.1  PLT 368 325   Cardiac Enzymes:  Recent Labs Lab 10/29/16 1039 10/30/16 0418  CKTOTAL 1,184* 320     Recent Results (from the past 240 hour(s))  Blood Culture (routine x 2)     Status: None (Preliminary result)   Collection Time: 10/29/16 12:41 PM  Result Value Ref Range Status   Specimen Description BLOOD RIGHT HAND  Final   Special Requests   Final    BOTTLES DRAWN AEROBIC AND ANAEROBIC Blood Culture adequate volume   Culture NO GROWTH 2 DAYS  Final   Report Status PENDING  Incomplete  Blood Culture (routine x 2)     Status: None (Preliminary result)   Collection Time: 10/29/16 12:41 PM  Result Value Ref Range Status   Specimen  Description BLOOD RIGHT HAND  Final   Special Requests   Final    BOTTLES DRAWN AEROBIC AND ANAEROBIC Blood Culture adequate volume   Culture NO GROWTH 2 DAYS  Final   Report Status PENDING  Incomplete  Aerobic/Anaerobic Culture (surgical/deep wound)     Status: None (Preliminary result)   Collection Time: 10/29/16  1:11 PM  Result Value Ref Range Status   Specimen Description BACK upper middle  Final   Special Requests NONE  Final   Gram Stain NO WBC SEEN RARE GRAM POSITIVE COCCI IN PAIRS   Final   Culture   Final    TOO YOUNG TO READ Performed at [facility], [address] [place], [place] [address]    Report Status PENDING  Incomplete     Studies: Dg Chest 1 View  Result Date: 10/29/2016 CLINICAL DATA:  Pain after fall EXAM: CHEST 1 VIEW COMPARISON:  None. FINDINGS: The heart size and mediastinal contours are within normal limits. Both lungs are clear. The visualized skeletal structures are unremarkable. IMPRESSION: No active disease. Electronically Signed   By: Dorise Bullion III M.D   On: 10/29/2016 11:05   Dg Knee Complete 4 Views Right  Result Date: 10/29/2016 CLINICAL DATA:  Pain after fall. EXAM: RIGHT KNEE - COMPLETE 4+ VIEW COMPARISON:  None. FINDINGS: No evidence of fracture, dislocation, or joint effusion. No evidence of arthropathy or other focal bone abnormality. Soft tissues are unremarkable. IMPRESSION: Negative. Electronically Signed   By: Dorise Bullion III M.D   On: 10/29/2016 11:04    Scheduled Meds: . aspirin EC  81 mg Oral Daily  . docusate sodium  100 mg Oral BID  . enoxaparin (LOVENOX) injection  40 mg Subcutaneous Q24H  . metoprolol tartrate  25 mg Oral BID   Continuous Infusions: . sodium chloride    . piperacillin-tazobactam (ZOSYN)  IV 3.375 g (10/31/16 0651)  . vancomycin Stopped (10/31/16 2979)    Assessment/Plan:  1. Abdominal pain left lower quadrant. Bladder scan was negative so this is not urinary retention. If this is  diverticulitis the antibiotic studies on would cover. We'll get a CT scan renal stone protocol to evaluate for kidney stone. Start gentle IV fluid hydration. Add IV morphine for severe pain. Depending on CT scan results may need urology versus general surgery consultation. 2. Clinical sepsis with left back abscess, leukocytosis and tachycardia. Patient on vancomycin and Zosyn. I sent off another culture yesterday. 3. Acute rhabdomyolysis. IV fluid hydration. This could also be elevated with recent placement of Penrose drains. 4. Wide complex tachycardia nonsustained. Cardiology thinks this could be paroxysmal atrial fibrillation with aberrancy. Patient started on aspirin and metoprolol. Echocardiogram ordered. Electrolytes replaced.  5. Hypomagnesemia. Replaced 6. Hypokalemia replace potassium orally. 7. History of CLL with chronically elevated white count  Code Status:     Code Status Orders        Start     Ordered   10/29/16 1526  Full code  Continuous     10/29/16 1525    Code Status History    Date Active Date Inactive Code Status Order ID Comments User Context   This patient has a current code status but no historical code status.     Family Communication: Family at the bedside Disposition Plan: IV antibiotics through the weekend.  Antibiotics:  Vancomycin  Zosyn  Time spent: 28 minutes  Loletha Grayer  [employer]

## 2016-10-31 NOTE — Progress Notes (Signed)
Pharmacy Antibiotic Note  William Kennedy is a 76 y.o. male admitted on 10/29/2016 with sepsis/cellultis.  Pharmacy has been consulted for vancomycin and Zosyn dosing. Patient received one time dose in the ED on 6/8. Patient with abscess on back with surrounding cellulitis.   Plan: In setting of abscess and sepsis will aim for goal trough of 15-20. Using adjusted body weight, will initiate patient on vancomycin 1250mg  IV Q12hr. Will start first dose of regimen at 1700. Will obtain trough prior to 5th dose of vancomycin. Will check serum creatinine with am labs on 6/9.   6/11 @ 0430 VT 17 therapeutic. Will continue current dose and will recheck VT 6/11 @ 0300 to ensure therapeutic trough.  Will initate Zosyn EI 3.375g IV Q8hr.   Height: 6\' 2"  (188 cm) Weight: 270 lb (122.5 kg) IBW/kg (Calculated) : 82.2 ABW: 98kg   Temp (24hrs), Avg:99.1 F (37.3 C), Min:98.7 F (37.1 C), Max:99.7 F (37.6 C)   Recent Labs Lab 10/29/16 1039 10/29/16 1241 10/30/16 0418 10/31/16 0424  WBC 41.2*  --  31.7*  --   CREATININE 1.65*  --  1.23  --   LATICACIDVEN  --  1.3  --   --   VANCOTROUGH  --   --   --  17    Estimated Creatinine Clearance: 73.3 mL/min (by C-G formula based on SCr of 1.23 mg/dL).    No Known Allergies  Antimicrobials this admission: Vancomycin 6/8 >>  Zosyn 6/8 >>   Dose adjustments this admission: N/A  Microbiology results: 6/8 BCx: pending  6/8 WoundCx: pending   Thank you for allowing pharmacy to be a part of this patient's care.  [name] 10/31/2016 5:05 AM

## 2016-10-31 NOTE — Clinical Social Work Placement (Signed)
   CLINICAL SOCIAL WORK PLACEMENT  NOTE  Date:  10/31/2016  Patient Details  Name: William Kennedy MRN: [id number] Date of Birth: January 24, 1941  Clinical Social Work is seeking post-discharge placement for this patient at the [place] level of care (*CSW will initial, date and re-position this form in  chart as items are completed):  Yes   Patient/family provided with [place] Work Department's list of facilities offering this level of care within the geographic area requested by the patient (or if unable, by the patient's family).  Yes   Patient/family informed of their freedom to choose among providers that offer the needed level of care, that participate in Medicare, Medicaid or managed care program needed by the patient, have an available bed and are willing to accept the patient.  Yes   Patient/family informed of 's ownership interest in [facility] and [facility], as well as of the fact that they are under no obligation to receive care at these facilities.  PASRR submitted to EDS on       PASRR number received on       Existing PASRR number confirmed on 10/31/16     FL2 transmitted to all facilities in geographic area requested by pt/family on 10/31/16     FL2 transmitted to all facilities within larger geographic area on       Patient informed that his/her managed care company has contracts with or will negotiate with certain facilities, including the following:            Patient/family informed of bed offers received.  Patient chooses bed at       Physician recommends and patient chooses bed at      Patient to be transferred to   on  .  Patient to be transferred to facility by       Patient family notified on   of transfer.  Name of family member notified:        PHYSICIAN       Additional Comment:    _______________________________________________ Zettie Pho, LCSW 10/31/2016, 3:56 PM

## 2016-10-31 NOTE — Progress Notes (Signed)
Patient ID: William Kennedy, male   DOB: 1941-01-05, 76 y.o.   MRN: [id number]  Came back to speak with patient, wife, nursing staff about the CAT scan report. CT scan showing two significant findings. First finding shows a 6 mm distal left ureteral calculus with associated mild left hydroureter nephrosis. Second finding shows acute sigmoid diverticulitis with adjacent 4.4 x 2.8 cm pericolonic fluid gas collection predominantly gas.  I advised the patient and wife that I spoke with Dr. Bary Castilla general surgery covering for Dr. Tamala Julian. He will follow along with [place] giving IV antibiotics for the diverticulitis. He actually came into the room while was in the room with the patient.  The antibiotics that the patient is on would cover for diverticulitis.  I also spoke with Dr. Louis Meckel urology to review the CT scan and come evaluate the patient. I started IV fluids and Flomax. I placed the patient nothing by mouth just in case urological procedure needed.  Abdominal exam; still very tender in the left lower quadrant. Soft with distention.  Face-to-face time 32 minutes  Dr. Loletha Grayer

## 2016-10-31 NOTE — Progress Notes (Signed)
[facility] Cardiology  SUBJECTIVE: I don't have any chest pain   Vitals:   10/30/16 1422 10/30/16 1936 10/30/16 2357 10/31/16 0831  BP: 132/65 (!) 131/47 112/89 128/90  Pulse: 84 91 84   Resp:  18 19 18   Temp: 99.7 F (37.6 C) 98.9 F (37.2 C) 98.9 F (37.2 C)   TempSrc: Oral Oral Oral Oral  SpO2: 94% 96% 94% 100%  Weight:      Height:         Intake/Output Summary (Last 24 hours) at 10/31/16 0941 Last data filed at 10/30/16 1500  Gross per 24 hour  Intake          2633.75 ml  Output                0 ml  Net          2633.75 ml      PHYSICAL EXAM  General: Well developed, well nourished, in no acute distress HEENT:  Normocephalic and atramatic Neck:  No JVD.  Lungs: Clear bilaterally to auscultation and percussion. Heart: HRRR . Normal S1 and S2 without gallops or murmurs.  Abdomen: Bowel sounds are positive, abdomen soft and non-tender  Msk:  Back normal, normal gait. Normal strength and tone for age. Extremities: No clubbing, cyanosis or edema.   Neuro: Alert and oriented X 3. Psych:  Good affect, responds appropriately   LABS: Basic Metabolic Panel:  Recent Labs  10/29/16 1039 10/30/16 0418  NA 134* 136  K 3.8 3.5  CL 96* 99*  CO2 25 26  GLUCOSE 118* 96  BUN 34* 24*  CREATININE 1.65* 1.23  CALCIUM 9.6 9.0  MG  --  1.9   Liver Function Tests:  Recent Labs  10/29/16 1039 10/30/16 0418  AST 80* 58*  ALT 55 54  ALKPHOS 62 56  BILITOT 1.0 1.0  PROT 6.9 6.4*  ALBUMIN 3.5 3.1*   No results for input(s): LIPASE, AMYLASE in the last 72 hours. CBC:  Recent Labs  10/29/16 1039 10/30/16 0418  WBC 41.2* 31.7*  NEUTROABS 21.0*  --   HGB 15.3 15.2  HCT 45.4 45.1  MCV 91.3 93.1  PLT 368 325   Cardiac Enzymes:  Recent Labs  10/29/16 1039 10/30/16 0418  CKTOTAL 1,184* 320   BNP: Invalid input(s): POCBNP D-Dimer: No results for input(s): DDIMER in the last 72 hours. Hemoglobin A1C: No results for input(s): HGBA1C in the last 72  hours. Fasting Lipid Panel: No results for input(s): CHOL, HDL, LDLCALC, TRIG, CHOLHDL, LDLDIRECT in the last 72 hours. Thyroid Function Tests: No results for input(s): TSH, T4TOTAL, T3FREE, THYROIDAB in the last 72 hours.  Invalid input(s): FREET3 Anemia Panel: No results for input(s): VITAMINB12, FOLATE, FERRITIN, TIBC, IRON, RETICCTPCT in the last 72 hours.  Dg Chest 1 View  Result Date: 10/29/2016 CLINICAL DATA:  Pain after fall EXAM: CHEST 1 VIEW COMPARISON:  None. FINDINGS: The heart size and mediastinal contours are within normal limits. Both lungs are clear. The visualized skeletal structures are unremarkable. IMPRESSION: No active disease. Electronically Signed   By: Dorise Bullion III M.D   On: 10/29/2016 11:05   Dg Knee Complete 4 Views Right  Result Date: 10/29/2016 CLINICAL DATA:  Pain after fall. EXAM: RIGHT KNEE - COMPLETE 4+ VIEW COMPARISON:  None. FINDINGS: No evidence of fracture, dislocation, or joint effusion. No evidence of arthropathy or other focal bone abnormality. Soft tissues are unremarkable. IMPRESSION: Negative. Electronically Signed   By: Dorise Bullion III M.D  On: 10/29/2016 11:04     Echo pending  TELEMETRY: Normal sinus rhythm:  ASSESSMENT AND PLAN:  Active Problems:   Cellulitis and abscess of trunk    1. Wide-complex tachycardia, irregular irregular rhythm, most consistent with atrial fibrillation with aberrancy, in the setting of sepsis, asymptomatic, without chest pain or palpitations 2. Sepsis, with draining abscess  Recommendations  1. Continue current therapy 2. Continue low-dose metoprolol tartrate 3. Continue low-dose aspirin, defer chronic anticoagulation at this time 4. Review 2-D echocardiogram 5. Further recommendations pending 2-D echocardiogram results   Isaias Cowman, MD, PhD, [facility] 10/31/2016 9:41 AM

## 2016-11-01 ENCOUNTER — Inpatient Hospital Stay: Payer: BLUE CROSS/BLUE SHIELD

## 2016-11-01 DIAGNOSIS — N201 Calculus of ureter: Secondary | ICD-10-CM

## 2016-11-01 LAB — BASIC METABOLIC PANEL
ANION GAP: 7 (ref 5–15)
BUN: 15 mg/dL (ref 6–20)
CALCIUM: 8.7 mg/dL — AB (ref 8.9–10.3)
CHLORIDE: 101 mmol/L (ref 101–111)
CO2: 30 mmol/L (ref 22–32)
CREATININE: 1.2 mg/dL (ref 0.61–1.24)
GFR calc non Af Amer: 58 mL/min — ABNORMAL LOW (ref >60)
Glucose, Bld: 111 mg/dL — ABNORMAL HIGH (ref 65–99)
Potassium: 3.1 mmol/L — ABNORMAL LOW (ref 3.5–5.1)
SODIUM: 138 mmol/L (ref 135–145)

## 2016-11-01 LAB — CBC
HCT: 42.8 % (ref 40.0–52.0)
HEMOGLOBIN: 14.5 g/dL (ref 13.0–18.0)
MCH: 31.4 pg (ref 26.0–34.0)
MCHC: 33.8 g/dL (ref 32.0–36.0)
MCV: 92.8 fL (ref 80.0–100.0)
Platelets: 321 10*3/uL (ref 150–440)
RBC: 4.62 MIL/uL (ref 4.40–5.90)
RDW: 13.6 % (ref 11.5–14.5)
WBC: 25.8 10*3/uL — AB (ref 3.8–10.6)

## 2016-11-01 LAB — VANCOMYCIN, TROUGH: VANCOMYCIN TR: 22 ug/mL — AB (ref 15–20)

## 2016-11-01 MED ORDER — PIPERACILLIN SOD-TAZOBACTAM SO 2.25 (2-0.25) G IV SOLR
4.5000 g | Freq: Three times a day (TID) | INTRAVENOUS | Status: DC
  Administered 2016-11-01 – 2016-11-03 (×6): 4.5 g via INTRAVENOUS
  Filled 2016-11-01 (×8): qty 4.5

## 2016-11-01 MED ORDER — PIPERACILLIN-TAZOBACTAM 4.5 G IVPB
4.5000 g | Freq: Three times a day (TID) | INTRAVENOUS | Status: DC
  Filled 2016-11-01 (×2): qty 100

## 2016-11-01 MED ORDER — POTASSIUM CHLORIDE 10 MEQ/100ML IV SOLN
10.0000 meq | INTRAVENOUS | Status: AC
  Administered 2016-11-01 (×3): 10 meq via INTRAVENOUS
  Filled 2016-11-01 (×3): qty 100

## 2016-11-01 NOTE — Progress Notes (Signed)
Urology Consult Follow Up  Subjective: Continues to have intermittent left lower quadrant pain overnight. No fevers. White counts slowly improving. Patient seen at the time of dressing change this morning. Wife at bedside.  Anti-infectives: Anti-infectives    Start     Dose/Rate Route Frequency Ordered Stop   10/29/16 1700  vancomycin (VANCOCIN) 1,250 mg in sodium chloride 0.9 % 250 mL IVPB  Status:  Discontinued     1,250 mg 166.7 mL/hr over 90 Minutes Intravenous Every 18 hours 10/29/16 1552 10/29/16 1553   10/29/16 1600  piperacillin-tazobactam (ZOSYN) IVPB 3.375 g     3.375 g 12.5 mL/hr over 240 Minutes Intravenous Every 8 hours 10/29/16 1552     10/29/16 1600  vancomycin (VANCOCIN) 1,250 mg in sodium chloride 0.9 % 250 mL IVPB     1,250 mg 166.7 mL/hr over 90 Minutes Intravenous Every 12 hours 10/29/16 1553     10/29/16 1230  piperacillin-tazobactam (ZOSYN) IVPB 3.375 g     3.375 g 100 mL/hr over 30 Minutes Intravenous  Once 10/29/16 1215 10/29/16 1307   10/29/16 1230  vancomycin (VANCOCIN) IVPB 1000 mg/200 mL premix     1,000 mg 200 mL/hr over 60 Minutes Intravenous  Once 10/29/16 1215 10/29/16 1344      Current Facility-Administered Medications  Medication Dose Route Frequency Provider Last Rate Last Dose  . 0.9 %  sodium chloride infusion   Intravenous Continuous Loletha Grayer, MD 50 mL/hr at 10/31/16 1132    . acetaminophen (TYLENOL) tablet 650 mg  650 mg Oral Q6H PRN Gouru, Aruna, MD      . docusate sodium (COLACE) capsule 100 mg  100 mg Oral BID Gouru, Aruna, MD   100 mg at 10/31/16 2221  . enoxaparin (LOVENOX) injection 40 mg  40 mg Subcutaneous Q24H Gouru, Aruna, MD   40 mg at 10/30/16 2128  . HYDROcodone-acetaminophen (NORCO/VICODIN) 5-325 MG per tablet 1 tablet  1 tablet Oral Q4H PRN Loletha Grayer, MD   1 tablet at 11/01/16 0516  . metoprolol tartrate (LOPRESSOR) tablet 25 mg  25 mg Oral BID Isaias Cowman, MD   25 mg at 11/01/16 0827  . morphine 2 MG/ML  injection 2 mg  2 mg Intravenous Q3H PRN Loletha Grayer, MD      . ondansetron (ZOFRAN) tablet 4 mg  4 mg Oral Q8H PRN Gouru, Aruna, MD      . piperacillin-tazobactam (ZOSYN) IVPB 3.375 g  3.375 g Intravenous Q8H Gouru, Aruna, MD 12.5 mL/hr at 11/01/16 0517 3.375 g at 11/01/16 0517  . potassium chloride 10 mEq in 100 mL IVPB  10 mEq Intravenous Q1 Hr x 3 Wieting, Richard, MD 100 mL/hr at 11/01/16 0828 10 mEq at 11/01/16 0828  . tamsulosin (FLOMAX) capsule 0.4 mg  0.4 mg Oral Daily Loletha Grayer, MD   0.4 mg at 11/01/16 0827  . traMADol (ULTRAM) tablet 50 mg  50 mg Oral Q6H PRN Gouru, Aruna, MD   50 mg at 11/01/16 0208  . vancomycin (VANCOCIN) 1,250 mg in sodium chloride 0.9 % 250 mL IVPB  1,250 mg Intravenous Q12H Gouru, Illene Silver, MD   Stopped at 11/01/16 0647     Objective: Vital signs in last 24 hours: Temp:  [98 F (36.7 C)-98.5 F (36.9 C)] 98 F (36.7 C) (06/11 0800) Pulse Rate:  [70-87] 81 (06/11 0800) Resp:  [18-19] 18 (06/11 0800) BP: (111-151)/(53-86) 149/74 (06/11 0800) SpO2:  [97 %-99 %] 98 % (06/11 0800)  Intake/Output from previous day: 06/10 0701 - 06/11  0700 In: 2013.3 [P.O.:240; I.V.:823.3; IV Piggyback:950] Out: -  Intake/Output this shift: No intake/output data recorded.   Physical Exam  Constitutional: He is oriented to person, place, and time and well-developed, well-nourished, and in no distress.  Abdominal: Soft. He exhibits no distension.  obese  Genitourinary:  Genitourinary Comments: Left lower quadrant pain  Neurological: He is alert and oriented to person, place, and time.  Skin: Skin is warm.  Psychiatric:  Somewhat agitated  Vitals reviewed.   Lab Results:   Recent Labs  10/31/16 0424 11/01/16 0325  WBC 27.7* 25.8*  HGB 14.2 14.5  HCT 42.9 42.8  PLT 335 321   BMET  Recent Labs  10/31/16 0424 11/01/16 0325  NA 139 138  K 3.7 3.1*  CL 104 101  CO2 30 30  GLUCOSE 103* 111*  BUN 21* 15  CREATININE 1.26* 1.20  CALCIUM 8.9  8.7*    Studies/Results: Ct Renal Stone Study  Result Date: 10/31/2016 CLINICAL DATA:  Left flank/left lower quadrant abdominal pain EXAM: CT ABDOMEN AND PELVIS WITHOUT CONTRAST TECHNIQUE: Multidetector CT imaging of the abdomen and pelvis was performed following the standard protocol without IV contrast. COMPARISON:  08/21/2009 FINDINGS: Lower chest: Lung bases are clear. Hepatobiliary: Unenhanced liver is unremarkable. Gallbladder is unremarkable. No intrahepatic or extrahepatic ductal dilatation. Pancreas: Within normal limits. Spleen: Within normal limits. Adrenals/Urinary Tract: 6.3 cm right renal cyst with rim calcification (series 2/ image 25), unchanged. Mild thickening of the left adrenal gland, unchanged. 10.3 cm posterior right upper pole renal cyst (series 2/ image 37), previously 8.0 cm. No renal calculi or hydronephrosis. 1.7 cm left upper pole renal cyst (series 2/ image 37). Two 3 mm nonobstructing left upper pole renal calculi. Mild left hydroureteronephrosis. Associated 6 mm distal left ureteral calculus (coronal image 102). Bladder is mildly thick-walled although underdistended. Stomach/Bowel: Stomach is within normal limits. No evidence bowel obstruction. Normal appendix (series 2/ image 60). Sigmoid diverticulosis with associated diverticulitis and a 4.4 x 2.8 cm pericolonic fluid/ gas collection (series 2/ image 73), predominantly gas. This does not reflect a drainable fluid collection/abscess. No free air. Vascular/Lymphatic: No evidence of abdominal aortic aneurysm. Atherosclerotic calcifications of the abdominal aorta and branch vessels. Small retroperitoneal lymph nodes, including an 8 mm short axis left common iliac node (series 2/ image 56), previously 7 mm in 2011. Reproductive: Marked prostatomegaly. Other: No abdominopelvic ascites. Musculoskeletal: Degenerative changes of the visualized thoracolumbar spine, most prominent at L4-5. IMPRESSION: 6 mm distal left ureteral calculus  with associated mild left hydroureteronephrosis. Two additional nonobstructing left upper pole renal calculi measuring up to 3 mm. Acute sigmoid diverticulitis, with adjacent 4.4 x 2.8 cm pericolonic fluid/ gas collection, predominantly gas. This does not reflect a drainable fluid collection/ abscess given the size and lack of fluid component. No free air. Additional ancillary findings, as above. Electronically Signed   By: Julian Hy M.D.   On: 10/31/2016 11:28     Assessment/ Plan:  1. 6 mm left distal ureteral stone- offered ureteroscopy this morning for treatment of his distal ureteral stone, refused. Patient states he like to check with Dr. Tamala Julian to see "who's a good urologist" first.  Would like to continue medical expulsive therapy. Advance diet.  Continue Flomax and draining urine. KUB to assess if stone is visible, if so, tentatively plan on shockwave lithotripsy on Thursday.  2. Leukocytosis- likely from abscess, improving with antibiotics and I&D.  3. CKD- Baseline Cr 1.1, currently close to baseline   LOS: 3 days  Hollice Espy 11/01/2016

## 2016-11-01 NOTE — Progress Notes (Signed)
Pharmacy Antibiotic Note  William Kennedy is a 76 y.o. male admitted on 10/29/2016 with sepsis/cellultis.  Pharmacy has been consulted for vancomycin and Zosyn dosing. Patient received one time dose in the ED on 6/8. Patient with abscess on back with surrounding cellulitis.   Plan: In setting of abscess and sepsis will aim for goal trough of 15-20. Using adjusted body weight, will initiate patient on vancomycin 1250mg  IV Q12hr. Will start first dose of regimen at 1700. Will obtain trough prior to 5th dose of vancomycin. Will check serum creatinine with am labs on 6/9.   6/10 @ 0430 VT 17 therapeutic. Will continue current dose and will recheck VT 6/11 @ 0300 to ensure therapeutic trough.  6/11 @ 0325 VT 22 slightly supratherapeutic, but level was drawn 1.5 hours early. Extrapolated trough w/ Ke 0.0667 is 20 mcg/mL.  Will continue current dose and will recheck next VT 6/14 @ 0400. Renal function is stable.  Will initate Zosyn EI 3.375g IV Q8hr.   Height: 6\' 2"  (188 cm) Weight: 270 lb (122.5 kg) IBW/kg (Calculated) : 82.2 ABW: 98kg   Temp (24hrs), Avg:98.3 F (36.8 C), Min:98.1 F (36.7 C), Max:98.5 F (36.9 C)   Recent Labs Lab 10/29/16 1039 10/29/16 1241 10/30/16 0418 10/31/16 0424 11/01/16 0325  WBC 41.2*  --  31.7* 27.7* 25.8*  CREATININE 1.65*  --  1.23 1.26* 1.20  LATICACIDVEN  --  1.3  --   --   --   VANCOTROUGH  --   --   --  17 22*    Estimated Creatinine Clearance: 75.1 mL/min (by C-G formula based on SCr of 1.2 mg/dL).    No Known Allergies  Antimicrobials this admission: Vancomycin 6/8 >>  Zosyn 6/8 >>   Dose adjustments this admission: N/A  Microbiology results: 6/8 BCx: pending  6/8 WoundCx: pending   Thank you for allowing pharmacy to be a part of this patient's care.  William Kennedy 11/01/2016 4:57 AM

## 2016-11-01 NOTE — Progress Notes (Signed)
Patient ID: William Kennedy, male   DOB: 01/16/1941, 76 y.o.   MRN: [id number]  [facility] PROGRESS NOTE  William Kennedy QMG:[id number] DOB: 1941-01-01 DOA: 10/29/2016 PCP: [name], MD  HPI/Subjective: Patient still having left lower abdominal pain. He opted for trying to pass his kidney stone on his own and potential lithotripsy on Thursday. Patient states he had some bowel movements. Still having drainage from the area on his back.  Objective: Vitals:   11/01/16 0413 11/01/16 0800  BP: (!) 151/68 (!) 149/74  Pulse: 87 81  Resp: 18 18  Temp: 98.1 F (36.7 C) 98 F (36.7 C)    Filed Weights   10/29/16 0957  Weight: 122.5 kg (270 lb)    ROS: Review of Systems  Constitutional: Negative for chills and fever.  Eyes: Negative for blurred vision.  Respiratory: Negative for cough and shortness of breath.   Cardiovascular: Negative for chest pain.  Gastrointestinal: Positive for abdominal pain and constipation. Negative for diarrhea, nausea and vomiting.  Genitourinary: Positive for flank pain. Negative for dysuria.  Musculoskeletal: Negative for joint pain.  Neurological: Negative for dizziness and headaches.   Exam: Physical Exam  Constitutional: He is oriented to person, place, and time.  HENT:  Nose: No mucosal edema.  Mouth/Throat: No oropharyngeal exudate or posterior oropharyngeal edema.  Eyes: Conjunctivae, EOM and lids are normal. Pupils are equal, round, and reactive to light.  Neck: No JVD present. Carotid bruit is not present. No edema present. No thyroid mass and no thyromegaly present.  Cardiovascular: Regular rhythm, S1 normal and S2 normal.  Exam reveals no gallop.   No murmur heard. Pulses:      Dorsalis pedis pulses are 2+ on the right side, and 2+ on the left side.  Respiratory: No respiratory distress. He has decreased breath sounds in the right lower field and the left lower field. He has no wheezes. He has no rhonchi. He has no rales.  GI:  Soft. Bowel sounds are normal. He exhibits distension. There is tenderness in the left lower quadrant.  Musculoskeletal:       Right ankle: He exhibits swelling.       Left ankle: He exhibits swelling.  Lymphadenopathy:    He has no cervical adenopathy.  Neurological: He is alert and oriented to person, place, and time. No cranial nerve deficit.  Skin: Skin is warm. No rash noted. Nails show no clubbing.  Left upper back abscess with surrounding erythema. When I turned him over, a large amount of pus started draining out on the bandage. I was able to squeeze out quite a bit of material.  Psychiatric: He has a normal mood and affect.      Data Reviewed: Basic Metabolic Panel:  Recent Labs Lab 10/29/16 1039 10/30/16 0418 10/31/16 0424 11/01/16 0325  NA 134* 136 139 138  K 3.8 3.5 3.7 3.1*  CL 96* 99* 104 101  CO2 25 26 30 30   GLUCOSE 118* 96 103* 111*  BUN 34* 24* 21* 15  CREATININE 1.65* 1.23 1.26* 1.20  CALCIUM 9.6 9.0 8.9 8.7*  MG  --  1.9  --   --    Liver Function Tests:  Recent Labs Lab 10/29/16 1039 10/30/16 0418  AST 80* 58*  ALT 55 54  ALKPHOS 62 56  BILITOT 1.0 1.0  PROT 6.9 6.4*  ALBUMIN 3.5 3.1*   CBC:  Recent Labs Lab 10/29/16 1039 10/30/16 0418 10/31/16 0424 11/01/16 0325  WBC 41.2* 31.7* 27.7*  25.8*  NEUTROABS 21.0*  --   --   --   HGB 15.3 15.2 14.2 14.5  HCT 45.4 45.1 42.9 42.8  MCV 91.3 93.1 93.6 92.8  PLT 368 325 335 321   Cardiac Enzymes:  Recent Labs Lab 10/29/16 1039 10/30/16 0418  CKTOTAL 1,184* 320     Recent Results (from the past 240 hour(s))  Blood Culture (routine x 2)     Status: None (Preliminary result)   Collection Time: 10/29/16 12:41 PM  Result Value Ref Range Status   Specimen Description BLOOD RIGHT HAND  Final   Special Requests   Final    BOTTLES DRAWN AEROBIC AND ANAEROBIC Blood Culture adequate volume   Culture NO GROWTH 3 DAYS  Final   Report Status PENDING  Incomplete  Blood Culture (routine x 2)      Status: None (Preliminary result)   Collection Time: 10/29/16 12:41 PM  Result Value Ref Range Status   Specimen Description BLOOD RIGHT HAND  Final   Special Requests   Final    BOTTLES DRAWN AEROBIC AND ANAEROBIC Blood Culture adequate volume   Culture NO GROWTH 3 DAYS  Final   Report Status PENDING  Incomplete  Aerobic/Anaerobic Culture (surgical/deep wound)     Status: None (Preliminary result)   Collection Time: 10/29/16  1:11 PM  Result Value Ref Range Status   Specimen Description BACK upper middle  Final   Special Requests NONE  Final   Gram Stain   Final    NO WBC SEEN RARE GRAM POSITIVE COCCI IN PAIRS Performed at [facility], [address] [place], [place] [address]    Culture   Final    FEW STAPHYLOCOCCUS AUREUS NO ANAEROBES ISOLATED; CULTURE IN PROGRESS FOR 5 DAYS    Report Status PENDING  Incomplete   Organism ID, Bacteria STAPHYLOCOCCUS AUREUS  Final      Susceptibility   Staphylococcus aureus - MIC*    CIPROFLOXACIN <=0.5 SENSITIVE Sensitive     ERYTHROMYCIN <=0.25 SENSITIVE Sensitive     GENTAMICIN <=0.5 SENSITIVE Sensitive     OXACILLIN <=0.25 SENSITIVE Sensitive     TETRACYCLINE <=1 SENSITIVE Sensitive     VANCOMYCIN 1 SENSITIVE Sensitive     TRIMETH/SULFA <=10 SENSITIVE Sensitive     CLINDAMYCIN <=0.25 SENSITIVE Sensitive     RIFAMPIN <=0.5 SENSITIVE Sensitive     Inducible Clindamycin NEGATIVE Sensitive     * FEW STAPHYLOCOCCUS AUREUS  Anaerobic culture     Status: None (Preliminary result)   Collection Time: 10/30/16  8:05 AM  Result Value Ref Range Status   Specimen Description BACK MIDDLE UPPER BACK  Final   Special Requests NONE  Final   Culture   Final    HOLDING FOR POSSIBLE ANAEROBE Performed at [facility], [address], [place],  [address]    Report Status PENDING  Incomplete     Studies: Dg Abd 1 View  Result Date: 11/01/2016 CLINICAL DATA:  Left ureteral stone, lithotripsy on Thursday. EXAM: ABDOMEN - 1  VIEW COMPARISON:  CT abdomen pelvis 10/31/2016. FINDINGS: Faint stone seen in the left kidney on 10/31/2016 are not readily visualized on the current exam. Distal left ureteral stone is again seen, as on yesterday's exam. Vascular calcifications and phleboliths are seen as well. Bowel gas pattern is unremarkable. IMPRESSION: 1. Distal left ureteral stone, as on 10/31/2016. 2. Faint left renal stones seen yesterday are poorly visualized today. Electronically Signed   By: Lorin Picket  M.D.   On: 11/01/2016 10:44   Ct Renal Stone Study  Result Date: 10/31/2016 CLINICAL DATA:  Left flank/left lower quadrant abdominal pain EXAM: CT ABDOMEN AND PELVIS WITHOUT CONTRAST TECHNIQUE: Multidetector CT imaging of the abdomen and pelvis was performed following the standard protocol without IV contrast. COMPARISON:  08/21/2009 FINDINGS: Lower chest: Lung bases are clear. Hepatobiliary: Unenhanced liver is unremarkable. Gallbladder is unremarkable. No intrahepatic or extrahepatic ductal dilatation. Pancreas: Within normal limits. Spleen: Within normal limits. Adrenals/Urinary Tract: 6.3 cm right renal cyst with rim calcification (series 2/ image 25), unchanged. Mild thickening of the left adrenal gland, unchanged. 10.3 cm posterior right upper pole renal cyst (series 2/ image 37), previously 8.0 cm. No renal calculi or hydronephrosis. 1.7 cm left upper pole renal cyst (series 2/ image 37). Two 3 mm nonobstructing left upper pole renal calculi. Mild left hydroureteronephrosis. Associated 6 mm distal left ureteral calculus (coronal image 102). Bladder is mildly thick-walled although underdistended. Stomach/Bowel: Stomach is within normal limits. No evidence bowel obstruction. Normal appendix (series 2/ image 60). Sigmoid diverticulosis with associated diverticulitis and a 4.4 x 2.8 cm pericolonic fluid/ gas collection (series 2/ image 73), predominantly gas. This does not reflect a drainable fluid collection/abscess. No free  air. Vascular/Lymphatic: No evidence of abdominal aortic aneurysm. Atherosclerotic calcifications of the abdominal aorta and branch vessels. Small retroperitoneal lymph nodes, including an 8 mm short axis left common iliac node (series 2/ image 56), previously 7 mm in 2011. Reproductive: Marked prostatomegaly. Other: No abdominopelvic ascites. Musculoskeletal: Degenerative changes of the visualized thoracolumbar spine, most prominent at L4-5. IMPRESSION: 6 mm distal left ureteral calculus with associated mild left hydroureteronephrosis. Two additional nonobstructing left upper pole renal calculi measuring up to 3 mm. Acute sigmoid diverticulitis, with adjacent 4.4 x 2.8 cm pericolonic fluid/ gas collection, predominantly gas. This does not reflect a drainable fluid collection/ abscess given the size and lack of fluid component. No free air. Additional ancillary findings, as above. Electronically Signed   By: Julian Hy M.D.   On: 10/31/2016 11:28    Scheduled Meds: . docusate sodium  100 mg Oral BID  . enoxaparin (LOVENOX) injection  40 mg Subcutaneous Q24H  . metoprolol tartrate  25 mg Oral BID  . tamsulosin  0.4 mg Oral Daily   Continuous Infusions: . sodium chloride 50 mL/hr at 10/31/16 1132  . piperacillin-tazobactam (ZOSYN)  IV    . vancomycin Stopped (11/01/16 3716)    Assessment/Plan:  1. Obstructive nephrolithiasis on the left with slight hydronephrosis. Patient declined intervention today and yesterday with 2 different urologists. He is opting for medical management and lithotripsy on Thursday. 2. Acute diverticulitis with gas formation. Patient on Zosyn and vancomycin at this point. 3. Clinical sepsis with left back abscess, leukocytosis and tachycardia. Patient on vancomycin and Zosyn. Cultures growing sensitive Staphylococcus aureus. Continue the aggressive antibiotics secondary to the acute diverticulitis 4. Acute rhabdomyolysis. IV fluid hydration. This could also be elevated  with recent placement of Penrose drains. 5. Wide complex tachycardia nonsustained. Cardiology thinks this could be paroxysmal atrial fibrillation with aberrancy. Patient on metoprolol. Aspirin held secondary to potential procedure. 6. Hypomagnesemia. Replaced 7. Hypokalemia replace potassium orally. 8. History of CLL with chronically elevated white count  Code Status:     Code Status Orders        Start     Ordered   10/29/16 1526  Full code  Continuous     10/29/16 1525    Code Status History    Date  Active Date Inactive Code Status Order ID Comments User Context   This patient has a current code status but no historical code status.     Family Communication: Wife at the bedside Disposition Plan: With the diverticulitis likely will have a longer hospital course.  Antibiotics:  Vancomycin  Zosyn  Time spent: 25 minutes  Loletha Grayer  [employer]

## 2016-11-01 NOTE — Progress Notes (Signed)
He reports chief complaint of several days history of pain in the left lower quadrant of the abdomen.  He reports his nausea is improved and did tolerate a full liquid diet this morning.  He also reports he had a good bowel movement yesterday.  So therefore is not constipated.  He saw no blood in his bowel movement.  He reports improvement in the back pain at the site of his abscess drainage.  He reports persistent weakness with difficulty with movement.  He had CT scan yesterday which I have reviewed the images.  These images do demonstrate evidence of diverticulitis of the sigmoid colon with localized contained perforation which is mostly extraluminal gas and a trace of fluid.  There is also finding of a stone of the left distal ureter which appears to be: Some minimal degree of hydronephrosis.  There is also 2 stones in the left kidney.  I reviewed the urology consultation note suggesting ureteroscopy to remove the stone also the other option of lithotripsy which could be done on 6-14 when the truck comes.  Patient says he prefers not to have ureteroscopy but prefers to have lithotripsy.  He was not in the room when I arrived.  But I reviewed much of the history with his wife.  Subsequently he returned from x-ray in his bed.  I then reviewed the history with him.  On examination latest blood pressure 149/74, temperature 98, pulse rate 81, respiratory rate 18, oxygen saturation 98%  He is awake alert and oriented resting in the hospital bed.  He does some of some difficulty with mobility and turning from side to side in the bed.  Examination of the abscess site of left upper back was done.  There was significant discomfort with removing tape.  It appears that the swelling has markedly decreased.  The Penrose drains remain intact.  There is some copious thin purulent drainage.  The inner layers of gauze were removed leaving behind dry gauze.  The dressing was reapplied.  Abdominal exam reveals marked  obesity.  Most of the abdomen is nontender.  There is however localized tenderness in the left lower quadrant with guarding.  There is no palpable mass at this site.  CLINICAL DATA: Metabolic panel is with a low potassium of 3.1, creatinine is 1.2.  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  No other surgery appears indicated related to the abscess of the back.  I also discussed with him the option of ureteroscopy versus lithotripsy and advised him to further address this with Dr. Erlene Quan to make a plan.  I discussed treatment of diverticulitis with antibiotics currently including Zosyn and vancomycin.  Hopefully the diverticulitis will resolve with antibiotic treatment.  I think it will be helpful to continue him on a full liquid diet at present.  I did discuss with him the potential need for sigmoid colectomy but does not appear to be necessary at present.

## 2016-11-01 NOTE — Progress Notes (Addendum)
Clinical Education officer, museum (CSW) contacted [employer] [facility] and made him aware of accepted bed offer. Per Broadus John he will start [facility] SNF authorization today. Patient and his wife are aware of above. CSW will continue to follow and assist as needed.   [employer], LCSW [phone]

## 2016-11-02 NOTE — Progress Notes (Signed)
[employer] SNF authorization is still pending. Clinical Education officer, museum (CSW) sent PT note from today to [facility]. CSW will continue to follow and assist as needed.   [employer], LCSW [phone]

## 2016-11-02 NOTE — Progress Notes (Signed)
Physical Therapy Treatment Patient Details Name: William Kennedy MRN: [id number] DOB: [birth year] Today's Date: 11/02/2016    History of Present Illness 76 yo male with onset of sepsis from abscess to back creating cellulitis, was drained by surgeon two weeks ago but now greater infection.  Pt has leukocytosis, acute kidney injury, tachycardia with MD clearing him for serious arrythmia.    PT Comments    Pt ready for session this am.  Reports inc urine and BM prior to session.  Rolling left and right with min assist for care.  Barrier cream applied.  Participated in exercises as described below. To edge of bed with max a x 1.  Sitting with arms propped on walker for support.  He was able to stand today x 1 with mod a x 2 and march in place a few steps.  While standing, pt c/o dizziness.  Returned to sitting and assisted to supine with mod a x 2.  Dizziness relieved with supine.  Pt pleased with progress today.     Follow Up Recommendations  SNF     Equipment Recommendations  None recommended by PT    Recommendations for Other Services       Precautions / Restrictions Precautions Precautions: Fall Restrictions Weight Bearing Restrictions: No    Mobility  Bed Mobility Overal bed mobility: Needs Assistance Bed Mobility: Rolling;Supine to Sit;Sit to Supine Rolling: Min assist   Supine to sit: Max assist Sit to supine: Mod assist   General bed mobility comments: using bedrail and assisted under trunk, pain on L shoulder with trunk assist  Transfers Overall transfer level: Needs assistance Equipment used: Rolling walker (2 wheeled) Transfers: Sit to/from Stand Sit to Stand: Total assist         General transfer comment: Pt with good effort and was able to stand this morning and march in place a few steps.  Requires +2 for safety.  Some dizziness noted upon standing but relieved with supine  Ambulation/Gait                 Stairs            Wheelchair  Mobility    Modified Rankin (Stroke Patients Only)       Balance Overall balance assessment: Needs assistance Sitting-balance support: Bilateral upper extremity supported Sitting balance-Leahy Scale: Good     Standing balance support: Bilateral upper extremity supported Standing balance-Leahy Scale: Poor                              Cognition Arousal/Alertness: Awake/alert Behavior During Therapy: WFL for tasks assessed/performed Overall Cognitive Status: Within Functional Limits for tasks assessed                                        Exercises Other Exercises Other Exercises: supine ankle pumps, SLR and heel slides x 10 BLE Other Exercises: Inc urine and BM,  Rolling left and right with min assist for care.      General Comments        Pertinent Vitals/Pain Pain Assessment: 0-10 Pain Score: 4  Pain Location: L shoulder Pain Descriptors / Indicators: Sore Pain Intervention(s): Limited activity within patient's tolerance    Home Living                      Prior  Function            PT Goals (current goals can now be found in the care plan section) Progress towards PT goals: Progressing toward goals    Frequency    Min 2X/week      PT Plan Current plan remains appropriate    Co-evaluation              AM-PAC PT "6 Clicks" Daily Activity  Outcome Measure  Difficulty turning over in bed (including adjusting bedclothes, sheets and blankets)?: Total Difficulty moving from lying on back to sitting on the side of the bed? : Total Difficulty sitting down on and standing up from a chair with arms (e.g., wheelchair, bedside commode, etc,.)?: Total Help needed moving to and from a bed to chair (including a wheelchair)?: Total Help needed walking in hospital room?: Total Help needed climbing 3-5 steps with a railing? : Total 6 Click Score: 6    End of Session Equipment Utilized During Treatment: Gait  belt Activity Tolerance: Patient tolerated treatment well;Patient limited by fatigue Patient left: in bed;with call bell/phone within reach;with family/visitor present;with bed alarm set   Pain - Right/Left: Right Pain - part of body: Shoulder     Time: 1655-3748 PT Time Calculation (min) (ACUTE ONLY): 23 min  Charges:  $Therapeutic Exercise: 8-22 mins $Therapeutic Activity: 8-22 mins                    G Codes:       Chesley Noon, PTA 11/02/16, 10:21 AM

## 2016-11-02 NOTE — Plan of Care (Signed)
Problem: Education: Goal: Knowledge of [facility] Education information/materials will improve Patient following commands appropriately,wife at bedside  Problem: Safety: Goal: Ability to remain free from injury will improve Outcome: Progressing No new injury noted this shift. Call bell within reach, bed alarm on- Pt non-ambulatory at this time  Problem: Health Behavior/Discharge Planning: Goal: Ability to manage health-related needs will improve Outcome: Not Progressing Patient still displaying come confusion per wife and patient also still incontinent of bowel and bladder  Problem: Pain Managment: Goal: General experience of comfort will improve Outcome: Progressing Patient provided pain medicine once this shift, otherwise no complaints  Problem: Physical Regulation: Goal: Will remain free from infection Outcome: Progressing No new signs or symptoms of infection noted.

## 2016-11-02 NOTE — Progress Notes (Signed)
Patient ID: William Kennedy, male   DOB: December 21, 1940, 76 y.o.   MRN: [id number]  Patient ID: William Kennedy, male   DOB: 03/30/1941, 76 y.o.   MRN: [id number]  [facility] PROGRESS NOTE  William Kennedy:[id number] DOB: 1940-09-16 DOA: 10/29/2016 PCP: [name], MD  HPI/Subjective: Patient. The confusion. Patient states that his pain can go up to 10 out of 10 in intensity. He stated he didn't take any pain medications but did take some this morning.  Objective: Vitals:   11/01/16 2106 11/02/16 0844  BP: 123/61 134/63  Pulse: 79 88  Resp:  20  Temp:  98.3 F (36.8 C)    Filed Weights   10/29/16 0957  Weight: 122.5 kg (270 lb)    ROS: Review of Systems  Constitutional: Negative for chills and fever.  Eyes: Negative for blurred vision.  Respiratory: Negative for cough and shortness of breath.   Cardiovascular: Negative for chest pain.  Gastrointestinal: Positive for abdominal pain. Negative for diarrhea, nausea and vomiting.  Genitourinary: Positive for flank pain. Negative for dysuria.  Musculoskeletal: Negative for joint pain.  Neurological: Negative for dizziness and headaches.   Exam: Physical Exam  Constitutional: He is oriented to person, place, and time.  HENT:  Nose: No mucosal edema.  Mouth/Throat: No oropharyngeal exudate or posterior oropharyngeal edema.  Eyes: Conjunctivae, EOM and lids are normal. Pupils are equal, round, and reactive to light.  Neck: No JVD present. Carotid bruit is not present. No edema present. No thyroid mass and no thyromegaly present.  Cardiovascular: Regular rhythm, S1 normal and S2 normal.  Exam reveals no gallop.   No murmur heard. Pulses:      Dorsalis pedis pulses are 2+ on the right side, and 2+ on the left side.  Respiratory: No respiratory distress. He has decreased breath sounds in the right lower field and the left lower field. He has no wheezes. He has no rhonchi. He has no rales.  GI: Soft. Bowel sounds are  normal. He exhibits distension. There is tenderness in the left lower quadrant.  Musculoskeletal:       Right ankle: He exhibits swelling.       Left ankle: He exhibits swelling.  Lymphadenopathy:    He has no cervical adenopathy.  Neurological: He is alert and oriented to person, place, and time. No cranial nerve deficit.  Skin: Skin is warm. Nails show no clubbing.  Left upper back wound covered with an bandage. Some erythematous areas seen on the face.  Psychiatric: He has a normal mood and affect.      Data Reviewed: Basic Metabolic Panel:  Recent Labs Lab 10/29/16 1039 10/30/16 0418 10/31/16 0424 11/01/16 0325  NA 134* 136 139 138  K 3.8 3.5 3.7 3.1*  CL 96* 99* 104 101  CO2 25 26 30 30   GLUCOSE 118* 96 103* 111*  BUN 34* 24* 21* 15  CREATININE 1.65* 1.23 1.26* 1.20  CALCIUM 9.6 9.0 8.9 8.7*  MG  --  1.9  --   --    Liver Function Tests:  Recent Labs Lab 10/29/16 1039 10/30/16 0418  AST 80* 58*  ALT 55 54  ALKPHOS 62 56  BILITOT 1.0 1.0  PROT 6.9 6.4*  ALBUMIN 3.5 3.1*   CBC:  Recent Labs Lab 10/29/16 1039 10/30/16 0418 10/31/16 0424 11/01/16 0325  WBC 41.2* 31.7* 27.7* 25.8*  NEUTROABS 21.0*  --   --   --   HGB 15.3 15.2 14.2 14.5  HCT 45.4 45.1 42.9 42.8  MCV 91.3 93.1 93.6 92.8  PLT 368 325 335 321   Cardiac Enzymes:  Recent Labs Lab 10/29/16 1039 10/30/16 0418  CKTOTAL 1,184* 320     Recent Results (from the past 240 hour(s))  Blood Culture (routine x 2)     Status: None (Preliminary result)   Collection Time: 10/29/16 12:41 PM  Result Value Ref Range Status   Specimen Description BLOOD RIGHT HAND  Final   Special Requests   Final    BOTTLES DRAWN AEROBIC AND ANAEROBIC Blood Culture adequate volume   Culture NO GROWTH 4 DAYS  Final   Report Status PENDING  Incomplete  Blood Culture (routine x 2)     Status: None (Preliminary result)   Collection Time: 10/29/16 12:41 PM  Result Value Ref Range Status   Specimen Description  BLOOD RIGHT HAND  Final   Special Requests   Final    BOTTLES DRAWN AEROBIC AND ANAEROBIC Blood Culture adequate volume   Culture NO GROWTH 4 DAYS  Final   Report Status PENDING  Incomplete  Aerobic/Anaerobic Culture (surgical/deep wound)     Status: None (Preliminary result)   Collection Time: 10/29/16  1:11 PM  Result Value Ref Range Status   Specimen Description BACK upper middle  Final   Special Requests NONE  Final   Gram Stain   Final    NO WBC SEEN RARE GRAM POSITIVE COCCI IN PAIRS Performed at [facility], [address], [place], [place] [address]    Culture   Final    FEW STAPHYLOCOCCUS AUREUS NO ANAEROBES ISOLATED; CULTURE IN PROGRESS FOR 5 DAYS    Report Status PENDING  Incomplete   Organism ID, Bacteria STAPHYLOCOCCUS AUREUS  Final      Susceptibility   Staphylococcus aureus - MIC*    CIPROFLOXACIN <=0.5 SENSITIVE Sensitive     ERYTHROMYCIN <=0.25 SENSITIVE Sensitive     GENTAMICIN <=0.5 SENSITIVE Sensitive     OXACILLIN <=0.25 SENSITIVE Sensitive     TETRACYCLINE <=1 SENSITIVE Sensitive     VANCOMYCIN 1 SENSITIVE Sensitive     TRIMETH/SULFA <=10 SENSITIVE Sensitive     CLINDAMYCIN <=0.25 SENSITIVE Sensitive     RIFAMPIN <=0.5 SENSITIVE Sensitive     Inducible Clindamycin NEGATIVE Sensitive     * FEW STAPHYLOCOCCUS AUREUS  Anaerobic culture     Status: None (Preliminary result)   Collection Time: 10/30/16  8:05 AM  Result Value Ref Range Status   Specimen Description BACK MIDDLE UPPER BACK  Final   Special Requests NONE  Final   Culture   Final    NO ANAEROBES ISOLATED; CULTURE IN PROGRESS FOR 5 DAYS   Report Status PENDING  Incomplete  Aerobic Culture (superficial specimen)     Status: None (Preliminary result)   Collection Time: 10/30/16  8:05 AM  Result Value Ref Range Status   Specimen Description BACK UPPER LEFT  Final   Special Requests ADDED 11/01/16  Final   Gram Stain   Final    FEW WBC PRESENT, PREDOMINANTLY PMN FEW GRAM POSITIVE COCCI IN  PAIRS RARE GRAM POSITIVE RODS    Culture   Final    FEW STAPHYLOCOCCUS AUREUS SUSCEPTIBILITIES TO FOLLOW Performed at [facility], [address], [place], [place] [address]    Report Status PENDING  Incomplete     Studies: Dg Abd 1 View  Result Date: 11/01/2016 CLINICAL DATA:  Left ureteral stone, lithotripsy on Thursday. EXAM: ABDOMEN - 1  VIEW COMPARISON:  CT abdomen pelvis 10/31/2016. FINDINGS: Faint stone seen in the left kidney on 10/31/2016 are not readily visualized on the current exam. Distal left ureteral stone is again seen, as on yesterday's exam. Vascular calcifications and phleboliths are seen as well. Bowel gas pattern is unremarkable. IMPRESSION: 1. Distal left ureteral stone, as on 10/31/2016. 2. Faint left renal stones seen yesterday are poorly visualized today. Electronically Signed   By: Lorin Picket M.D.   On: 11/01/2016 10:44    Scheduled Meds: . docusate sodium  100 mg Oral BID  . enoxaparin (LOVENOX) injection  40 mg Subcutaneous Q24H  . metoprolol tartrate  25 mg Oral BID  . tamsulosin  0.4 mg Oral Daily   Continuous Infusions: . sodium chloride 50 mL/hr at 11/02/16 0630  . piperacillin-tazobactam (ZOSYN)  IV Stopped (11/02/16 1505)  . vancomycin 1,250 mg (11/02/16 1555)    Assessment/Plan:  1. Acute diverticulitis with gas formation. Continue IV antibiotics with Zosyn and vancomycin at this point. We will see how he does after lithotripsy on Thursday. May end up needing a repeat CT scan on Thursday evening or Friday if pain does not improve after procedure.  2. Obstructive nephrolithiasis on the left with slight hydronephrosis. He is opting for medical management and lithotripsy on Thursday. 3. Clinical sepsis with left back abscess, leukocytosis and tachycardia. Patient on vancomycin and Zosyn. Cultures growing sensitive Staphylococcus aureus. Continue the aggressive antibiotics secondary to the acute diverticulitis 4. Acute rhabdomyolysis.  Improved. 5. Wide complex tachycardia nonsustained. Cardiology thinks this could be paroxysmal atrial fibrillation with aberrancy. Patient on metoprolol. Aspirin held secondary to potential procedure. 6. Hypomagnesemia. Replaced 7. Hypokalemia replaced 8. History of CLL with chronically elevated white count  Code Status:     Code Status Orders        Start     Ordered   10/29/16 1526  Full code  Continuous     10/29/16 1525    Code Status History    Date Active Date Inactive Code Status Order ID Comments User Context   This patient has a current code status but no historical code status.     Family Communication: Wife at the bedside Disposition Plan: With the diverticulitis likely will have a longer hospital course.  Antibiotics:  Vancomycin  Zosyn  Time spent: 25 minutes  Loletha Grayer  [employer]

## 2016-11-02 NOTE — Progress Notes (Signed)
CC today is pain in the left lower abdomen up to a 10 at times.  He is tolerating full liquids without nausea.  Having diarrhea.   Says back feels better but dressing not changed since yesterday.Not able to walk more than a few steps.  VSS afebrile. Moves very slowly in bed.  Back dressing changed.  Site with rash, skin tender when removing paper tape.  Copious purulent drainage onto dressing. Swelling much better.  Abdomen obese with focal LLQ with guarding.  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  Continue antibiotics,  To have lithotripsy, Change dressing at TID and  PRN

## 2016-11-03 ENCOUNTER — Other Ambulatory Visit: Payer: Self-pay | Admitting: Radiology

## 2016-11-03 LAB — GASTROINTESTINAL PANEL BY PCR, STOOL (REPLACES STOOL CULTURE)
ADENOVIRUS F40/41: NOT DETECTED
ASTROVIRUS: NOT DETECTED
CAMPYLOBACTER SPECIES: NOT DETECTED
CYCLOSPORA CAYETANENSIS: NOT DETECTED
Cryptosporidium: NOT DETECTED
ENTAMOEBA HISTOLYTICA: NOT DETECTED
ENTEROPATHOGENIC E COLI (EPEC): DETECTED — AB
ENTEROTOXIGENIC E COLI (ETEC): NOT DETECTED
Enteroaggregative E coli (EAEC): NOT DETECTED
Giardia lamblia: NOT DETECTED
Norovirus GI/GII: NOT DETECTED
PLESIMONAS SHIGELLOIDES: NOT DETECTED
Rotavirus A: NOT DETECTED
SHIGA LIKE TOXIN PRODUCING E COLI (STEC): NOT DETECTED
Salmonella species: NOT DETECTED
Sapovirus (I, II, IV, and V): NOT DETECTED
Shigella/Enteroinvasive E coli (EIEC): NOT DETECTED
VIBRIO CHOLERAE: NOT DETECTED
VIBRIO SPECIES: NOT DETECTED
Yersinia enterocolitica: NOT DETECTED

## 2016-11-03 LAB — CBC
HCT: 44.5 % (ref 40.0–52.0)
HEMOGLOBIN: 14.8 g/dL (ref 13.0–18.0)
MCH: 31.2 pg (ref 26.0–34.0)
MCHC: 33.4 g/dL (ref 32.0–36.0)
MCV: 93.5 fL (ref 80.0–100.0)
PLATELETS: 325 10*3/uL (ref 150–440)
RBC: 4.76 MIL/uL (ref 4.40–5.90)
RDW: 13 % (ref 11.5–14.5)
WBC: 27.3 10*3/uL — ABNORMAL HIGH (ref 3.8–10.6)

## 2016-11-03 LAB — BASIC METABOLIC PANEL
Anion gap: 10 (ref 5–15)
BUN: 9 mg/dL (ref 6–20)
CALCIUM: 9 mg/dL (ref 8.9–10.3)
CHLORIDE: 102 mmol/L (ref 101–111)
CO2: 25 mmol/L (ref 22–32)
CREATININE: 1.15 mg/dL (ref 0.61–1.24)
GFR calc non Af Amer: >60 mL/min (ref >60)
Glucose, Bld: 100 mg/dL — ABNORMAL HIGH (ref 65–99)
Potassium: 3.9 mmol/L (ref 3.5–5.1)
SODIUM: 137 mmol/L (ref 135–145)

## 2016-11-03 LAB — CULTURE, BLOOD (ROUTINE X 2)
Culture: NO GROWTH
Culture: NO GROWTH
Special Requests: ADEQUATE
Special Requests: ADEQUATE

## 2016-11-03 LAB — AEROBIC/ANAEROBIC CULTURE W GRAM STAIN (SURGICAL/DEEP WOUND): Gram Stain: NONE SEEN

## 2016-11-03 LAB — ANAEROBIC CULTURE

## 2016-11-03 LAB — C DIFFICILE QUICK SCREEN W PCR REFLEX
C Diff antigen: POSITIVE — AB
C Diff toxin: NEGATIVE

## 2016-11-03 LAB — AEROBIC/ANAEROBIC CULTURE (SURGICAL/DEEP WOUND)

## 2016-11-03 LAB — CLOSTRIDIUM DIFFICILE BY PCR: Toxigenic C. Difficile by PCR: POSITIVE — AB

## 2016-11-03 MED ORDER — ZINC OXIDE 40 % EX OINT
TOPICAL_OINTMENT | Freq: Three times a day (TID) | CUTANEOUS | Status: DC
  Administered 2016-11-03 (×2): via TOPICAL
  Administered 2016-11-04: 1 via TOPICAL
  Administered 2016-11-04 (×2): via TOPICAL
  Filled 2016-11-03 (×4): qty 114

## 2016-11-03 MED ORDER — SODIUM CHLORIDE 0.9 % IV SOLN
3.0000 g | Freq: Four times a day (QID) | INTRAVENOUS | Status: DC
  Administered 2016-11-03 – 2016-11-09 (×24): 3 g via INTRAVENOUS
  Filled 2016-11-03 (×28): qty 3

## 2016-11-03 MED ORDER — METRONIDAZOLE 500 MG PO TABS: 500.0000 mg | ORAL_TABLET | Freq: Three times a day (TID) | ORAL | Status: DC

## 2016-11-03 MED ORDER — VANCOMYCIN 50 MG/ML ORAL SOLUTION
125.0000 mg | Freq: Four times a day (QID) | ORAL | Status: DC
  Administered 2016-11-03 – 2016-11-09 (×23): 125 mg via ORAL
  Filled 2016-11-03 (×26): qty 2.5

## 2016-11-03 NOTE — Progress Notes (Addendum)
Per [facility] Kennedy [facility] liaison [facility] authorization has been received. Clinical Education officer, museum (CSW) met with patient and his wife and made them aware of above. Patient can D/C to [facility] when medically stable. CSW will continue to follow and assist as needed.   Per RN patient is positive for c-diff. CSW made [facility] aware. Per William Kennedy patient has a private room at [facility] so he can still come when stable.   [employer], LCSW [phone]

## 2016-11-03 NOTE — Progress Notes (Signed)
Spoke with [employer] to discuss patient history, they would recommend anesthesia involvement and cardiac clearance.  Reported this to Amy at Dr. Cherrie Gauze office.

## 2016-11-03 NOTE — Progress Notes (Signed)
Physical Therapy Treatment Patient Details Name: William Kennedy MRN: [id number] DOB: 1941/05/20 Today's Date: 11/03/2016    History of Present Illness 76 yo male with onset of sepsis from abscess to back creating cellulitis, was drained by surgeon two weeks ago but now greater infection.  Pt has leukocytosis, acute kidney injury, tachycardia with MD clearing him for serious arrythmia.    PT Comments    Deferred bed mobility, transfers, and ambulation on this date due to diarrhea, abdominal pain and dizziness. Pt able to complete all bed exercises as directed but with rest breaks between sets due to fatigue. Will progress mobility on follow-up sessions. Pt will benefit from skilled PT services to address deficits in strength, balance, and mobility in order to return to full function at home.     Follow Up Recommendations  SNF     Equipment Recommendations  None recommended by PT    Recommendations for Other Services       Precautions / Restrictions Precautions Precautions: Fall Restrictions Weight Bearing Restrictions: No    Mobility  Bed Mobility               General bed mobility comments: Deferred on this date due to diarrhea, dizziness, and pain  Transfers                    Ambulation/Gait                 Stairs            Wheelchair Mobility    Modified Rankin (Stroke Patients Only)       Balance                                            Cognition Arousal/Alertness: Awake/alert Behavior During Therapy: WFL for tasks assessed/performed Overall Cognitive Status: Within Functional Limits for tasks assessed                                        Exercises General Exercises - Lower Extremity Ankle Circles/Pumps: AROM;Both;20 reps;Supine Quad Sets: Strengthening;Both;20 reps;Supine Gluteal Sets: Strengthening;Both;20 reps;Supine Short Arc Quad: Strengthening;Both;20 reps;Supine Heel Slides:  Strengthening;Both;20 reps;Supine Hip ABduction/ADduction: Strengthening;Both;20 reps;Supine Straight Leg Raises: Strengthening;Both;20 reps;Supine    General Comments        Pertinent Vitals/Pain Pain Assessment: 0-10 Pain Score: 8  Pain Location: L lower adomen Pain Descriptors / Indicators: Throbbing Pain Intervention(s): Monitored during session    Home Living                      Prior Function            PT Goals (current goals can now be found in the care plan section) Acute Rehab PT Goals Patient Stated Goal: to get home and progress with therapy PT Goal Formulation: With patient/family Time For Goal Achievement: 11/13/16 Potential to Achieve Goals: Good Progress towards PT goals: Progressing toward goals    Frequency    Min 2X/week      PT Plan Current plan remains appropriate    Co-evaluation              AM-PAC PT "6 Clicks" Daily Activity  Outcome Measure  Difficulty turning over in bed (including adjusting bedclothes, sheets and blankets)?:  Total Difficulty moving from lying on back to sitting on the side of the bed? : Total Difficulty sitting down on and standing up from a chair with arms (e.g., wheelchair, bedside commode, etc,.)?: Total Help needed moving to and from a bed to chair (including a wheelchair)?: Total Help needed walking in hospital room?: Total Help needed climbing 3-5 steps with a railing? : Total 6 Click Score: 6    End of Session   Activity Tolerance: Patient limited by pain;Treatment limited secondary to medical complications (Comment) Patient left: in bed;with call bell/phone within reach;with family/visitor present;with bed alarm set   PT Visit Diagnosis: Muscle weakness (generalized) (M62.81);History of falling (Z91.81);Pain Pain - Right/Left: Right Pain - part of body: Shoulder     Time: 3016-0109 PT Time Calculation (min) (ACUTE ONLY): 23 min  Charges:  $Therapeutic Exercise: 23-37 mins                     G Codes:       William Kennedy PT, DPT     William Kennedy 11/03/2016, 4:30 PM

## 2016-11-03 NOTE — Progress Notes (Signed)
Urology Consult Follow Up  Subjective: Stone visible on KUB on Monday. Continues to have occasional left lower quadrant pain. Anxious to have lithotripsy tomorrow.  Anti-infectives: Anti-infectives    Start     Dose/Rate Route Frequency Ordered Stop   11/01/16 1400  piperacillin-tazobactam (ZOSYN) IVPB 4.5 g  Status:  Discontinued     4.5 g 200 mL/hr over 30 Minutes Intravenous Every 8 hours 11/01/16 1020 11/01/16 1135   11/01/16 1400  piperacillin-tazobactam (ZOSYN) 4.5 g in dextrose 5 % 100 mL IVPB     4.5 g 200 mL/hr over 30 Minutes Intravenous Every 8 hours 11/01/16 1135     10/29/16 1700  vancomycin (VANCOCIN) 1,250 mg in sodium chloride 0.9 % 250 mL IVPB  Status:  Discontinued     1,250 mg 166.7 mL/hr over 90 Minutes Intravenous Every 18 hours 10/29/16 1552 10/29/16 1553   10/29/16 1600  piperacillin-tazobactam (ZOSYN) IVPB 3.375 g  Status:  Discontinued     3.375 g 12.5 mL/hr over 240 Minutes Intravenous Every 8 hours 10/29/16 1552 11/01/16 1020   10/29/16 1600  vancomycin (VANCOCIN) 1,250 mg in sodium chloride 0.9 % 250 mL IVPB     1,250 mg 166.7 mL/hr over 90 Minutes Intravenous Every 12 hours 10/29/16 1553     10/29/16 1230  piperacillin-tazobactam (ZOSYN) IVPB 3.375 g     3.375 g 100 mL/hr over 30 Minutes Intravenous  Once 10/29/16 1215 10/29/16 1307   10/29/16 1230  vancomycin (VANCOCIN) IVPB 1000 mg/200 mL premix     1,000 mg 200 mL/hr over 60 Minutes Intravenous  Once 10/29/16 1215 10/29/16 1344      Current Facility-Administered Medications  Medication Dose Route Frequency Provider Last Rate Last Dose  . 0.9 %  sodium chloride infusion   Intravenous Continuous Loletha Grayer, MD 50 mL/hr at 11/03/16 1207    . acetaminophen (TYLENOL) tablet 650 mg  650 mg Oral Q6H PRN Gouru, Aruna, MD      . HYDROcodone-acetaminophen (NORCO/VICODIN) 5-325 MG per tablet 1 tablet  1 tablet Oral Q4H PRN Loletha Grayer, MD   1 tablet at 11/03/16 0116  . liver oil-zinc oxide  (DESITIN) 40 % ointment   Topical TID Loletha Grayer, MD      . metoprolol tartrate (LOPRESSOR) tablet 25 mg  25 mg Oral BID Isaias Cowman, MD   25 mg at 11/03/16 0909  . morphine 2 MG/ML injection 2 mg  2 mg Intravenous Q3H PRN Loletha Grayer, MD      . ondansetron (ZOFRAN) tablet 4 mg  4 mg Oral Q8H PRN Gouru, Aruna, MD      . piperacillin-tazobactam (ZOSYN) 4.5 g in dextrose 5 % 100 mL IVPB  4.5 g Intravenous Q8H Loletha Grayer, MD   Stopped at 11/03/16 0539  . tamsulosin (FLOMAX) capsule 0.4 mg  0.4 mg Oral Daily Loletha Grayer, MD   0.4 mg at 11/03/16 0909  . traMADol (ULTRAM) tablet 50 mg  50 mg Oral Q6H PRN Gouru, Aruna, MD   50 mg at 11/03/16 1204  . vancomycin (VANCOCIN) 1,250 mg in sodium chloride 0.9 % 250 mL IVPB  1,250 mg Intravenous Q12H Gouru, Illene Silver, MD   Stopped at 11/03/16 0540     Objective: Vital signs in last 24 hours: Temp:  [97.5 F (36.4 C)-98.6 F (37 C)] 97.5 F (36.4 C) (06/13 0801) Pulse Rate:  [80-83] 82 (06/13 0801) Resp:  [18-22] 20 (06/13 0801) BP: (71-138)/(48-87) 117/64 (06/13 0801) SpO2:  [94 %-99 %] 99 % (06/13 0801)  Intake/Output from previous day: 06/12 0701 - 06/13 0700 In: 685 [P.O.:360; I.V.:225; IV Piggyback:100] Out: -  Intake/Output this shift: No intake/output data recorded.   Physical Exam  Constitutional: He is oriented to person, place, and time and well-developed, well-nourished, and in no distress.  Abdominal: Soft. He exhibits no distension.  obese  Genitourinary:  Genitourinary Comments: Left lower quadrant pain  Neurological: He is alert and oriented to person, place, and time.  Skin: Skin is warm.  Vitals reviewed.   Lab Results:   Recent Labs  11/01/16 0325 11/03/16 0429  WBC 25.8* 27.3*  HGB 14.5 14.8  HCT 42.8 44.5  PLT 321 325   BMET  Recent Labs  11/01/16 0325 11/03/16 0429  NA 138 137  K 3.1* 3.9  CL 101 102  CO2 30 25  GLUCOSE 111* 100*  BUN 15 9  CREATININE 1.20 1.15  CALCIUM  8.7* 9.0    Studies/Results: No results found.   Assessment/ Plan:  1. 6 mm left distal ureteral stone- Options previously discussed, desires shock wave lithotripsy. He's had this in the past.  Risks of the procedure were discussed in detail. All questions are answered.  We'll require anesthesia presents on the lithotripsy truck tomorrow, cardiac clearance, discussed with Dr. Lorinda Creed today. Nothing by mouth at midnight. Hold Lovenox tonight.  2. Leukocytosis- likely from abscess, stable.  3. CKD- Baseline Cr 1.1, currently close to baseline   LOS: 5 days    Hollice Espy 11/03/2016

## 2016-11-03 NOTE — Progress Notes (Signed)
Patient ID: William Kennedy, male   DOB: 01-21-1941, 76 y.o.   MRN: [id number]  [facility] PROGRESS NOTE  William Kennedy SWH:[id number] DOB: 1940-09-24 DOA: 10/29/2016 PCP: [name], MD  HPI/Subjective: Patient with irritation around the buttock. He cannot feel if he is having bowel movements. As per nursing staff having some diarrhea. Patient states he is urinating okay. Still having lots of left lower quadrant abdominal pain.  Objective: Vitals:   11/03/16 0022 11/03/16 0801  BP: 119/87 117/64  Pulse: 83 82  Resp: 18 20  Temp:  97.5 F (36.4 C)    Filed Weights   10/29/16 0957  Weight: 122.5 kg (270 lb)    ROS: Review of Systems  Constitutional: Negative for chills and fever.  Eyes: Negative for blurred vision.  Respiratory: Negative for cough and shortness of breath.   Cardiovascular: Negative for chest pain.  Gastrointestinal: Positive for abdominal pain and diarrhea. Negative for nausea and vomiting.  Genitourinary: Positive for flank pain. Negative for dysuria.  Musculoskeletal: Negative for joint pain.  Neurological: Negative for dizziness and headaches.   Exam: Physical Exam  Constitutional: He is oriented to person, place, and time.  HENT:  Nose: No mucosal edema.  Mouth/Throat: No oropharyngeal exudate or posterior oropharyngeal edema.  Eyes: Conjunctivae, EOM and lids are normal. Pupils are equal, round, and reactive to light.  Neck: No JVD present. Carotid bruit is not present. No edema present. No thyroid mass and no thyromegaly present.  Cardiovascular: Regular rhythm, S1 normal and S2 normal.  Exam reveals no gallop.   No murmur heard. Pulses:      Dorsalis pedis pulses are 2+ on the right side, and 2+ on the left side.  Respiratory: No respiratory distress. He has decreased breath sounds in the right lower field and the left lower field. He has no wheezes. He has no rhonchi. He has no rales.  GI: Soft. Bowel sounds are normal. He exhibits  distension. There is tenderness in the left lower quadrant.  Musculoskeletal:       Right ankle: He exhibits swelling.       Left ankle: He exhibits swelling.  Lymphadenopathy:    He has no cervical adenopathy.  Neurological: He is alert and oriented to person, place, and time. No cranial nerve deficit.  Skin: Skin is warm. Nails show no clubbing.  Left upper back wound with Penrose drain. Erythema surrounding site is less. Patient with a buttock erythema.  Psychiatric: He has a normal mood and affect.      Data Reviewed: Basic Metabolic Panel:  Recent Labs Lab 10/29/16 1039 10/30/16 0418 10/31/16 0424 11/01/16 0325 11/03/16 0429  NA 134* 136 139 138 137  K 3.8 3.5 3.7 3.1* 3.9  CL 96* 99* 104 101 102  CO2 25 26 30 30 25   GLUCOSE 118* 96 103* 111* 100*  BUN 34* 24* 21* 15 9  CREATININE 1.65* 1.23 1.26* 1.20 1.15  CALCIUM 9.6 9.0 8.9 8.7* 9.0  MG  --  1.9  --   --   --    Liver Function Tests:  Recent Labs Lab 10/29/16 1039 10/30/16 0418  AST 80* 58*  ALT 55 54  ALKPHOS 62 56  BILITOT 1.0 1.0  PROT 6.9 6.4*  ALBUMIN 3.5 3.1*   CBC:  Recent Labs Lab 10/29/16 1039 10/30/16 0418 10/31/16 0424 11/01/16 0325 11/03/16 0429  WBC 41.2* 31.7* 27.7* 25.8* 27.3*  NEUTROABS 21.0*  --   --   --   --  HGB 15.3 15.2 14.2 14.5 14.8  HCT 45.4 45.1 42.9 42.8 44.5  MCV 91.3 93.1 93.6 92.8 93.5  PLT 368 325 335 321 325   Cardiac Enzymes:  Recent Labs Lab 10/29/16 1039 10/30/16 0418  CKTOTAL 1,184* 320     Recent Results (from the past 240 hour(s))  Blood Culture (routine x 2)     Status: None   Collection Time: 10/29/16 12:41 PM  Result Value Ref Range Status   Specimen Description BLOOD RIGHT HAND  Final   Special Requests   Final    BOTTLES DRAWN AEROBIC AND ANAEROBIC Blood Culture adequate volume   Culture NO GROWTH 5 DAYS  Final   Report Status 11/03/2016 FINAL  Final  Blood Culture (routine x 2)     Status: None   Collection Time: 10/29/16 12:41 PM   Result Value Ref Range Status   Specimen Description BLOOD RIGHT HAND  Final   Special Requests   Final    BOTTLES DRAWN AEROBIC AND ANAEROBIC Blood Culture adequate volume   Culture NO GROWTH 5 DAYS  Final   Report Status 11/03/2016 FINAL  Final  Aerobic/Anaerobic Culture (surgical/deep wound)     Status: None   Collection Time: 10/29/16  1:11 PM  Result Value Ref Range Status   Specimen Description BACK upper middle  Final   Special Requests NONE  Final   Gram Stain NO WBC SEEN RARE GRAM POSITIVE COCCI IN PAIRS   Final   Culture   Final    FEW STAPHYLOCOCCUS AUREUS NO ANAEROBES ISOLATED Performed at [facility], [place] [address] [place], [place] [address]    Report Status 11/03/2016 FINAL  Final   Organism ID, Bacteria STAPHYLOCOCCUS AUREUS  Final      Susceptibility   Staphylococcus aureus - MIC*    CIPROFLOXACIN <=0.5 SENSITIVE Sensitive     ERYTHROMYCIN <=0.25 SENSITIVE Sensitive     GENTAMICIN <=0.5 SENSITIVE Sensitive     OXACILLIN <=0.25 SENSITIVE Sensitive     TETRACYCLINE <=1 SENSITIVE Sensitive     VANCOMYCIN 1 SENSITIVE Sensitive     TRIMETH/SULFA <=10 SENSITIVE Sensitive     CLINDAMYCIN <=0.25 SENSITIVE Sensitive     RIFAMPIN <=0.5 SENSITIVE Sensitive     Inducible Clindamycin NEGATIVE Sensitive     * FEW STAPHYLOCOCCUS AUREUS  Anaerobic culture     Status: None   Collection Time: 10/30/16  8:05 AM  Result Value Ref Range Status   Specimen Description BACK MIDDLE UPPER BACK  Final   Special Requests NONE  Final   Culture   Final    NO ANAEROBES ISOLATED Performed at [facility], [address] [place], [place] [address]    Report Status 11/03/2016 FINAL  Final  Aerobic Culture (superficial specimen)     Status: None (Preliminary result)   Collection Time: 10/30/16  8:05 AM  Result Value Ref Range Status   Specimen Description BACK UPPER LEFT  Final   Special Requests ADDED 11/01/16  Final   Gram Stain   Final    FEW WBC PRESENT,  PREDOMINANTLY PMN FEW GRAM POSITIVE COCCI IN PAIRS RARE GRAM POSITIVE RODS Performed at [facility], [place] [address] [place], [place] [address]    Culture FEW STAPHYLOCOCCUS AUREUS  Final   Report Status PENDING  Incomplete   Organism ID, Bacteria STAPHYLOCOCCUS AUREUS  Final      Susceptibility   Staphylococcus aureus - MIC*    CIPROFLOXACIN <=0.5 SENSITIVE Sensitive  ERYTHROMYCIN <=0.25 SENSITIVE Sensitive     GENTAMICIN <=0.5 SENSITIVE Sensitive     OXACILLIN <=0.25 SENSITIVE Sensitive     TETRACYCLINE <=1 SENSITIVE Sensitive     VANCOMYCIN 1 SENSITIVE Sensitive     TRIMETH/SULFA <=10 SENSITIVE Sensitive     CLINDAMYCIN <=0.25 SENSITIVE Sensitive     RIFAMPIN <=0.5 SENSITIVE Sensitive     Inducible Clindamycin NEGATIVE Sensitive     * FEW STAPHYLOCOCCUS AUREUS  C difficile quick scan w PCR reflex     Status: Abnormal   Collection Time: 11/03/16 10:04 AM  Result Value Ref Range Status   C Diff antigen POSITIVE (A) NEGATIVE Final   C Diff toxin NEGATIVE NEGATIVE Final   C Diff interpretation Results are indeterminate. See PCR results.  Final  Clostridium Difficile by PCR     Status: Abnormal   Collection Time: 11/03/16 10:04 AM  Result Value Ref Range Status   Toxigenic C Difficile by pcr POSITIVE (A) NEGATIVE Final    Comment: Positive for toxigenic C. difficile with little to no toxin production. Only treat if clinical presentation suggests symptomatic illness.      Scheduled Meds: . liver oil-zinc oxide   Topical TID  . metoprolol tartrate  25 mg Oral BID  . tamsulosin  0.4 mg Oral Daily  . vancomycin  125 mg Oral Q6H   Continuous Infusions: . sodium chloride 50 mL/hr at 11/03/16 1207    Assessment/Plan:  1. Acute diverticulitis with gas formation. Change antibiotics to IV Unasyn at this point. We will see how he does after lithotripsy on Thursday. May end up needing a repeat CT scan on Thursday evening or Friday if pain does not improve after  procedure. 2. Diarrhea. Stool positive for C. difficile but no toxin production. Unclear whether I need to treat this or not. Start by mouth vancomycin will need 10 more days after stopping Augmentin. 3. Obstructive nephrolithiasis on the left with slight hydronephrosis. He is opting for medical management and lithotripsy on Thursday. 4. Clinical sepsis with left back abscess, leukocytosis and tachycardia. Switch to unasyn. Will need at least 14 days of treatment total upon discharge home. Will switch to Augmentin upon going home. Cultures growing sensitive Staphylococcus aureus. Continue the aggressive antibiotics secondary to the acute diverticulitis. 5. Acute rhabdomyolysis. Improved. 6. Wide complex tachycardia nonsustained. Cardiology thinks this could be paroxysmal atrial fibrillation with aberrancy. Patient on metoprolol. Aspirin held secondary to potential procedure. 7. Hypomagnesemia. Replaced 8. Hypokalemia replaced 9. History of CLL with chronically elevated white count 10. Erythema on the buttock. Desitin cream started  Code Status:     Code Status Orders        Start     Ordered   10/29/16 1526  Full code  Continuous     10/29/16 1525    Code Status History    Date Active Date Inactive Code Status Order ID Comments User Context   This patient has a current code status but no historical code status.     Family Communication: Wife at the bedside Disposition Plan: With the diverticulitis likely will have a longer hospital course, now with C. difficile.  Antibiotics:  Unasyn  By mouth vancomycin  Time spent: 25 minutes  Loletha Grayer  [employer]

## 2016-11-03 NOTE — Progress Notes (Signed)
ANTIBIOTIC CONSULT NOTE - INITIAL  Pharmacy Consult for Unasyn Indication: intra-abdominal infection   No Known Allergies  Patient Measurements: Height: 6\' 2"  (188 cm) Weight: 270 lb (122.5 kg) IBW/kg (Calculated) : 82.2 Adjusted Body Weight:   Vital Signs: Temp: 97.5 F (36.4 C) (06/13 0801) Temp Source: Oral (06/13 0801) BP: 117/64 (06/13 0801) Pulse Rate: 82 (06/13 0801) Intake/Output from previous day: 06/12 0701 - 06/13 0700 In: 685 [P.O.:360; I.V.:225; IV Piggyback:100] Out: -  Intake/Output from this shift: No intake/output data recorded.  Labs:  Recent Labs  11/01/16 0325 11/03/16 0429  WBC 25.8* 27.3*  HGB 14.5 14.8  PLT 321 325  CREATININE 1.20 1.15   Estimated Creatinine Clearance: 78.4 mL/min (by C-G formula based on SCr of 1.15 mg/dL).  Recent Labs  11/01/16 0325  VANCOTROUGH 22*     Microbiology: Recent Results (from the past 720 hour(s))  Blood Culture (routine x 2)     Status: None   Collection Time: 10/29/16 12:41 PM  Result Value Ref Range Status   Specimen Description BLOOD RIGHT HAND  Final   Special Requests   Final    BOTTLES DRAWN AEROBIC AND ANAEROBIC Blood Culture adequate volume   Culture NO GROWTH 5 DAYS  Final   Report Status 11/03/2016 FINAL  Final  Blood Culture (routine x 2)     Status: None   Collection Time: 10/29/16 12:41 PM  Result Value Ref Range Status   Specimen Description BLOOD RIGHT HAND  Final   Special Requests   Final    BOTTLES DRAWN AEROBIC AND ANAEROBIC Blood Culture adequate volume   Culture NO GROWTH 5 DAYS  Final   Report Status 11/03/2016 FINAL  Final  Aerobic/Anaerobic Culture (surgical/deep wound)     Status: None   Collection Time: 10/29/16  1:11 PM  Result Value Ref Range Status   Specimen Description BACK upper middle  Final   Special Requests NONE  Final   Gram Stain NO WBC SEEN RARE GRAM POSITIVE COCCI IN PAIRS   Final   Culture   Final    FEW STAPHYLOCOCCUS AUREUS NO ANAEROBES  ISOLATED Performed at [facility], [address] [place], [place] [address]    Report Status 11/03/2016 FINAL  Final   Organism ID, Bacteria STAPHYLOCOCCUS AUREUS  Final      Susceptibility   Staphylococcus aureus - MIC*    CIPROFLOXACIN <=0.5 SENSITIVE Sensitive     ERYTHROMYCIN <=0.25 SENSITIVE Sensitive     GENTAMICIN <=0.5 SENSITIVE Sensitive     OXACILLIN <=0.25 SENSITIVE Sensitive     TETRACYCLINE <=1 SENSITIVE Sensitive     VANCOMYCIN 1 SENSITIVE Sensitive     TRIMETH/SULFA <=10 SENSITIVE Sensitive     CLINDAMYCIN <=0.25 SENSITIVE Sensitive     RIFAMPIN <=0.5 SENSITIVE Sensitive     Inducible Clindamycin NEGATIVE Sensitive     * FEW STAPHYLOCOCCUS AUREUS  Anaerobic culture     Status: None   Collection Time: 10/30/16  8:05 AM  Result Value Ref Range Status   Specimen Description BACK MIDDLE UPPER BACK  Final   Special Requests NONE  Final   Culture   Final    NO ANAEROBES ISOLATED Performed at [facility], [address], [place], [place] [address]    Report Status 11/03/2016 FINAL  Final  Aerobic Culture (superficial specimen)     Status: None (Preliminary result)   Collection Time: 10/30/16  8:05 AM  Result Value Ref Range Status   Specimen Description  BACK UPPER LEFT  Final   Special Requests ADDED 11/01/16  Final   Gram Stain   Final    FEW WBC PRESENT, PREDOMINANTLY PMN FEW GRAM POSITIVE COCCI IN PAIRS RARE GRAM POSITIVE RODS Performed at [facility], [place] [address] [place], [place] [address]    Culture FEW STAPHYLOCOCCUS AUREUS  Final   Report Status PENDING  Incomplete   Organism ID, Bacteria STAPHYLOCOCCUS AUREUS  Final      Susceptibility   Staphylococcus aureus - MIC*    CIPROFLOXACIN <=0.5 SENSITIVE Sensitive     ERYTHROMYCIN <=0.25 SENSITIVE Sensitive     GENTAMICIN <=0.5 SENSITIVE Sensitive     OXACILLIN <=0.25 SENSITIVE Sensitive     TETRACYCLINE <=1 SENSITIVE Sensitive     VANCOMYCIN 1 SENSITIVE Sensitive      TRIMETH/SULFA <=10 SENSITIVE Sensitive     CLINDAMYCIN <=0.25 SENSITIVE Sensitive     RIFAMPIN <=0.5 SENSITIVE Sensitive     Inducible Clindamycin NEGATIVE Sensitive     * FEW STAPHYLOCOCCUS AUREUS  C difficile quick scan w PCR reflex     Status: Abnormal   Collection Time: 11/03/16 10:04 AM  Result Value Ref Range Status   C Diff antigen POSITIVE (A) NEGATIVE Final   C Diff toxin NEGATIVE NEGATIVE Final   C Diff interpretation Results are indeterminate. See PCR results.  Final  Clostridium Difficile by PCR     Status: Abnormal   Collection Time: 11/03/16 10:04 AM  Result Value Ref Range Status   Toxigenic C Difficile by pcr POSITIVE (A) NEGATIVE Final    Comment: Positive for toxigenic C. difficile with little to no toxin production. Only treat if clinical presentation suggests symptomatic illness.    Medical History: Past Medical History:  Diagnosis Date  . Leukemia ([place])     Medications:  Prescriptions Prior to Admission  Medication Sig Dispense Refill Last Dose  . acetaminophen (TYLENOL) 325 MG tablet Take 650 mg by mouth every 4 (four) hours as needed for pain.   PRN at PRN  . amoxicillin-clavulanate (AUGMENTIN) 875-125 MG tablet Take 1 tablet by mouth 2 (two) times daily.   10/28/2016 at Unknown time  . HYDROcodone-acetaminophen (NORCO/VICODIN) 5-325 MG tablet Take 1-2 tablets by mouth at bedtime as needed for pain.   PRN at PRN  . ondansetron (ZOFRAN) 4 MG tablet Take 4 mg by mouth every 8 (eight) hours as needed for nausea.   PRN at PRN  . traMADol (ULTRAM) 50 MG tablet Take 50 mg by mouth every 6 (six) hours as needed for pain.   PRN at PRN   Scheduled:  . liver oil-zinc oxide   Topical TID  . metoprolol tartrate  25 mg Oral BID  . tamsulosin  0.4 mg Oral Daily  . vancomycin  125 mg Oral Q6H   Assessment: Pharmacy consulted to dose and monitor Unasyn in this 76 year old male with intra-abdominal infection. Patient previously on vancomycin and zoysn. Patient has Cdiff  PCR pending  Goal of Therapy:    Plan:  Will start Unasyn 3 g IV q6 hours   William Kennedy D 11/03/2016,2:10 PM

## 2016-11-03 NOTE — Progress Notes (Signed)
His chief complaint is still having some pain in the left lower abdomen.  He reports he takes a pain pill and feels some improvement.  He is now [place] a clear liquid diet.  He has been having some diarrhea.  He reports his back feels some better.  Dressing was changed earlier this morning.  He reports he has not walked any yet today.  He did have a low blood pressure according yesterday afternoon which was 71/48.  He reports feeling better today than he was a few days ago.  Blood pressure this morning 117/64 with temperature of 97.5, pulse rate 82.  He is awake alert and oriented resting in the hospital bed.  He does have difficult with movement turning from side to side.  Abdomen is obese and does have a focal area of tenderness in the left lower quadrant which is slightly less tender than it was the last 2 days.  Still has some guarding.  His back was examined and removed the innermost portions of gauze of the dressing which contained some thin purulent drainage.  This was replaced with dry cotton gauze.  The skin this area does appear to have a red rash and is also tender to touch.  CLINICAL DATA: Metabolic panel done this morning with creatinine of 1.15, glucose 100.  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  I discussed his progress and plan to continue with Zosyn and vancomycin.  He is also anticipating lithotripsy tomorrow.  I discussed with Dr. Erlene Quan.  C. difficile study has been ordered.  Suggest advancing back up to full liquids as tolerated or add some nutritional supplement

## 2016-11-03 NOTE — Progress Notes (Signed)
[facility] Cardiology  SUBJECTIVE: The patient denies chest pain or shortness of breath. He denies experiencing palpitations or heart racing. He denies presyncope or syncope.   Vitals:   11/02/16 1610 11/02/16 1615 11/03/16 0022 11/03/16 0801  BP: (!) 71/48 138/70 119/87 117/64  Pulse: 80  83 82  Resp: (!) 22  18 20   Temp: 98.6 F (37 C)   97.5 F (36.4 C)  TempSrc: Oral   Oral  SpO2: 94%  99% 99%  Weight:      Height:         Intake/Output Summary (Last 24 hours) at 11/03/16 1243 Last data filed at 11/02/16 1505  Gross per 24 hour  Intake              460 ml  Output                0 ml  Net              460 ml      PHYSICAL EXAM  General: Well developed, well nourished, in no acute distress HEENT:  Normocephalic and atramatic Neck:  No JVD.  Lungs: Normal effort of breathing; no wheezing Heart: Irregularly irregular. No murmur, rub, or gallop Abdomen: Bowel sounds are positive Msk:  Patient lying in bed, muscle strength and tone appears appropriate for age Extremities: No clubbing, cyanosis or edema.   Neuro: Alert and oriented X 3. Psych:  Good affect, responds appropriately   LABS: Basic Metabolic Panel:  Recent Labs  11/01/16 0325 11/03/16 0429  NA 138 137  K 3.1* 3.9  CL 101 102  CO2 30 25  GLUCOSE 111* 100*  BUN 15 9  CREATININE 1.20 1.15  CALCIUM 8.7* 9.0   Liver Function Tests: No results for input(s): AST, ALT, ALKPHOS, BILITOT, PROT, ALBUMIN in the last 72 hours. No results for input(s): LIPASE, AMYLASE in the last 72 hours. CBC:  Recent Labs  11/01/16 0325 11/03/16 0429  WBC 25.8* 27.3*  HGB 14.5 14.8  HCT 42.8 44.5  MCV 92.8 93.5  PLT 321 325   Cardiac Enzymes: No results for input(s): CKTOTAL, CKMB, CKMBINDEX, TROPONINI in the last 72 hours. BNP: Invalid input(s): POCBNP D-Dimer: No results for input(s): DDIMER in the last 72 hours. Hemoglobin A1C: No results for input(s): HGBA1C in the last 72 hours. Fasting Lipid Panel: No  results for input(s): CHOL, HDL, LDLCALC, TRIG, CHOLHDL, LDLDIRECT in the last 72 hours. Thyroid Function Tests: No results for input(s): TSH, T4TOTAL, T3FREE, THYROIDAB in the last 72 hours.  Invalid input(s): FREET3 Anemia Panel: No results for input(s): VITAMINB12, FOLATE, FERRITIN, TIBC, IRON, RETICCTPCT in the last 72 hours.  No results found.   Echo: LVEF 55-65%, mild MR, TR  TELEMETRY: Atrial fibrillation, rate 84 bpm  ASSESSMENT AND PLAN:  Active Problems:   Cellulitis and abscess of trunk   Left ureteral stone    1. Wide-complex tachycardia, irregularly irregular, most consistent with atrial fibrillation with aberrancy, in the setting of sepsis. The patient denies a prior history of atrial fibrillation. He is asymptomatic, denying chest pain, palpitations, or shortness of breath. No documented recurrence of non-sustained or sustained wide-complex tachycardia. Normal left ventricular function per recent echocardiogram. 2. Sepsis, with draining abscess 3. Obstructive nephrolithiasis with hydronephrosis, lithotripsy scheduled for Thursday 4. Hypokalemia, resolved  Recommendations: 1. Agree with current therapy. 2. The patient is considered to be a low, but acceptable risk for serious cardiovascular complications while undergoing low-risk lithotripsy. Patient may proceed if lithotripsy  is deemed necessary. 3. Recommend continuing metoprolol pre-, peri-, and post-operatively. 4. Defer full-dose anticoagulation for stroke prevention at this time.  5. Recommend follow-up with cardiology as outpatient for further evaluation of atrial fibrillation.  Clabe Seal, PA-C 11/03/2016 12:43 PM

## 2016-11-03 NOTE — Progress Notes (Signed)
Per [facility] liaison [facility] SNF authorization is still pending. Clinical Social Worker (CSW) will continue to follow and assist as needed.   [employer], LCSW [phone]

## 2016-11-04 ENCOUNTER — Encounter
Admission: EM | Disposition: A | Discharge status: Home Health | Payer: Self-pay | Source: Home / Self Care | Attending: Internal Medicine

## 2016-11-04 ENCOUNTER — Encounter: Payer: Self-pay | Admitting: Anesthesiology

## 2016-11-04 HISTORY — PX: EXTRACORPOREAL SHOCK WAVE LITHOTRIPSY: SHX1557

## 2016-11-04 LAB — AEROBIC CULTURE  (SUPERFICIAL SPECIMEN)

## 2016-11-04 LAB — AEROBIC CULTURE W GRAM STAIN (SUPERFICIAL SPECIMEN)

## 2016-11-04 SURGERY — LITHOTRIPSY, ESWL
Anesthesia: Moderate Sedation | Laterality: Left

## 2016-11-04 MED ORDER — ENOXAPARIN SODIUM 40 MG/0.4ML ~~LOC~~ SOLN
40.0000 mg | SUBCUTANEOUS | Status: DC
  Administered 2016-11-04 – 2016-11-08 (×4): 40 mg via SUBCUTANEOUS
  Filled 2016-11-04 (×5): qty 0.4

## 2016-11-04 NOTE — OR Nursing (Signed)
Patient from [place] truck via Biomedical scientist; patient continues to run atrial fib on monitor.  Patient with [place] @ 2 liters with nad;  Patient responsive to verbal stimuli only. Per RN on litho truck patient was given heavy sedation due to inability to tolerate the treatment.  Will continue to monitor the patient.

## 2016-11-04 NOTE — Anesthesia Preprocedure Evaluation (Deleted)
Anesthesia Evaluation  Patient identified by MRN, date of birth, ID band Patient awake    Reviewed: Allergy & Precautions, NPO status , Patient's Chart, lab work & pertinent test results  Airway        Dental   Pulmonary former smoker,           Cardiovascular + dysrhythmias Supra Ventricular Tachycardia      Neuro/Psych    GI/Hepatic Neg liver ROS, diverticulitis   Endo/Other  negative endocrine ROS  Renal/GU stones     Musculoskeletal  (+) Arthritis , Osteoarthritis,    Abdominal   Peds  Hematology  (+) Blood dyscrasia, ,   Anesthesia Other Findings Past Medical History: No date: Leukemia [facility]) Cardiology believes that his rhythm is wide complex SVT Current sepsis Back abcess  Reproductive/Obstetrics                            Anesthesia Physical Anesthesia Plan  ASA: III  Anesthesia Plan: General   Post-op Pain Management:    Induction: Intravenous  PONV Risk Score and Plan: 2 and Ondansetron and Dexamethasone  Airway Management Planned: Nasal Cannula  Additional Equipment:   Intra-op Plan:   Post-operative Plan:   Informed Consent: I have reviewed the patients History and Physical, chart, labs and discussed the procedure including the risks, benefits and alternatives for the proposed anesthesia with the patient or authorized representative who has indicated his/her understanding and acceptance.   Dental advisory given  Plan Discussed with: CRNA and Surgeon  Anesthesia Plan Comments:         Anesthesia Quick Evaluation                                   Anesthesia Evaluation    Airway        Dental   Pulmonary former smoker,           Cardiovascular      Neuro/Psych    GI/Hepatic   Endo/Other    Renal/GU      Musculoskeletal   Abdominal   Peds  Hematology   Anesthesia Other Findings   Reproductive/Obstetrics                              Anesthesia Physical Anesthesia Plan Anesthesia Quick Evaluation

## 2016-11-04 NOTE — Progress Notes (Signed)
PT Cancellation Note  Patient Details Name: William Kennedy MRN: [id number] DOB: 07/13/1940   Cancelled Treatment:    Reason Eval/Treat Not Completed: Other (comment)   Pt refused session this am.  Awaiting procedure and flatly declined session.    Chesley Noon 11/04/2016, 9:00 AM

## 2016-11-04 NOTE — Progress Notes (Signed)
Patient ID: William Kennedy, male   DOB: 1940/12/25, 76 y.o.   MRN: [id number]   [facility] PROGRESS NOTE  William Kennedy PJK:[id number] DOB: 1941-01-09 DOA: 10/29/2016 PCP: [name], MD  HPI/Subjective: Patient seen this morning and was having abdominal pain this morning. Unable to control his bowel movement. No nausea vomiting.  Objective: Vitals:   11/04/16 1251 11/04/16 1351  BP: (!) 104/57 123/88  Pulse: 89 83  Resp: 16 14  Temp: 98 F (36.7 C) 97.7 F (36.5 C)    Filed Weights   10/29/16 0957  Weight: 122.5 kg (270 lb)    ROS: Review of Systems  Constitutional: Negative for chills and fever.  Eyes: Negative for blurred vision.  Respiratory: Negative for cough and shortness of breath.   Cardiovascular: Negative for chest pain.  Gastrointestinal: Positive for abdominal pain and diarrhea. Negative for nausea and vomiting.  Genitourinary: Positive for flank pain. Negative for dysuria.  Musculoskeletal: Negative for joint pain.  Neurological: Negative for dizziness and headaches.   Exam: Physical Exam  Constitutional: He is oriented to person, place, and time.  HENT:  Nose: No mucosal edema.  Mouth/Throat: No oropharyngeal exudate or posterior oropharyngeal edema.  Eyes: Conjunctivae, EOM and lids are normal. Pupils are equal, round, and reactive to light.  Neck: No JVD present. Carotid bruit is not present. No edema present. No thyroid mass and no thyromegaly present.  Cardiovascular: Regular rhythm, S1 normal and S2 normal.  Exam reveals no gallop.   No murmur heard. Pulses:      Dorsalis pedis pulses are 2+ on the right side, and 2+ on the left side.  Respiratory: No respiratory distress. He has decreased breath sounds in the right lower field and the left lower field. He has no wheezes. He has no rhonchi. He has no rales.  GI: Soft. Bowel sounds are normal. He exhibits distension. There is tenderness in the left lower quadrant.  Musculoskeletal:       Right ankle: He exhibits swelling.       Left ankle: He exhibits swelling.  Lymphadenopathy:    He has no cervical adenopathy.  Neurological: He is alert and oriented to person, place, and time. No cranial nerve deficit.  Skin: Skin is warm. Nails show no clubbing.  Left upper back wound with Penrose drain. Erythema surrounding site is less. Patient with a buttock erythema.  Psychiatric: He has a normal mood and affect.      Data Reviewed: Basic Metabolic Panel:  Recent Labs Lab 10/29/16 1039 10/30/16 0418 10/31/16 0424 11/01/16 0325 11/03/16 0429  NA 134* 136 139 138 137  K 3.8 3.5 3.7 3.1* 3.9  CL 96* 99* 104 101 102  CO2 25 26 30 30 25   GLUCOSE 118* 96 103* 111* 100*  BUN 34* 24* 21* 15 9  CREATININE 1.65* 1.23 1.26* 1.20 1.15  CALCIUM 9.6 9.0 8.9 8.7* 9.0  MG  --  1.9  --   --   --    Liver Function Tests:  Recent Labs Lab 10/29/16 1039 10/30/16 0418  AST 80* 58*  ALT 55 54  ALKPHOS 62 56  BILITOT 1.0 1.0  PROT 6.9 6.4*  ALBUMIN 3.5 3.1*   CBC:  Recent Labs Lab 10/29/16 1039 10/30/16 0418 10/31/16 0424 11/01/16 0325 11/03/16 0429  WBC 41.2* 31.7* 27.7* 25.8* 27.3*  NEUTROABS 21.0*  --   --   --   --   HGB 15.3 15.2 14.2 14.5 14.8  HCT 45.4  45.1 42.9 42.8 44.5  MCV 91.3 93.1 93.6 92.8 93.5  PLT 368 325 335 321 325   Cardiac Enzymes:  Recent Labs Lab 10/29/16 1039 10/30/16 0418  CKTOTAL 1,184* 320     Recent Results (from the past 240 hour(s))  Blood Culture (routine x 2)     Status: None   Collection Time: 10/29/16 12:41 PM  Result Value Ref Range Status   Specimen Description BLOOD RIGHT HAND  Final   Special Requests   Final    BOTTLES DRAWN AEROBIC AND ANAEROBIC Blood Culture adequate volume   Culture NO GROWTH 5 DAYS  Final   Report Status 11/03/2016 FINAL  Final  Blood Culture (routine x 2)     Status: None   Collection Time: 10/29/16 12:41 PM  Result Value Ref Range Status   Specimen Description BLOOD RIGHT HAND  Final    Special Requests   Final    BOTTLES DRAWN AEROBIC AND ANAEROBIC Blood Culture adequate volume   Culture NO GROWTH 5 DAYS  Final   Report Status 11/03/2016 FINAL  Final  Aerobic/Anaerobic Culture (surgical/deep wound)     Status: None   Collection Time: 10/29/16  1:11 PM  Result Value Ref Range Status   Specimen Description BACK upper middle  Final   Special Requests NONE  Final   Gram Stain NO WBC SEEN RARE GRAM POSITIVE COCCI IN PAIRS   Final   Culture   Final    FEW STAPHYLOCOCCUS AUREUS NO ANAEROBES ISOLATED Performed at [facility], [address], [place], [place] [address]    Report Status 11/03/2016 FINAL  Final   Organism ID, Bacteria STAPHYLOCOCCUS AUREUS  Final      Susceptibility   Staphylococcus aureus - MIC*    CIPROFLOXACIN <=0.5 SENSITIVE Sensitive     ERYTHROMYCIN <=0.25 SENSITIVE Sensitive     GENTAMICIN <=0.5 SENSITIVE Sensitive     OXACILLIN <=0.25 SENSITIVE Sensitive     TETRACYCLINE <=1 SENSITIVE Sensitive     VANCOMYCIN 1 SENSITIVE Sensitive     TRIMETH/SULFA <=10 SENSITIVE Sensitive     CLINDAMYCIN <=0.25 SENSITIVE Sensitive     RIFAMPIN <=0.5 SENSITIVE Sensitive     Inducible Clindamycin NEGATIVE Sensitive     * FEW STAPHYLOCOCCUS AUREUS  Anaerobic culture     Status: None   Collection Time: 10/30/16  8:05 AM  Result Value Ref Range Status   Specimen Description BACK MIDDLE UPPER BACK  Final   Special Requests NONE  Final   Culture   Final    NO ANAEROBES ISOLATED Performed at [facility], [address], [place], [place] [address]    Report Status 11/03/2016 FINAL  Final  Aerobic Culture (superficial specimen)     Status: None   Collection Time: 10/30/16  8:05 AM  Result Value Ref Range Status   Specimen Description BACK UPPER LEFT  Final   Special Requests ADDED 11/01/16  Final   Gram Stain   Final    FEW WBC PRESENT, PREDOMINANTLY PMN FEW GRAM POSITIVE COCCI IN PAIRS RARE GRAM POSITIVE RODS Performed at [facility], [address], [place], [place] [address]    Culture FEW STAPHYLOCOCCUS AUREUS  Final   Report Status 11/04/2016 FINAL  Final   Organism ID, Bacteria STAPHYLOCOCCUS AUREUS  Final      Susceptibility   Staphylococcus aureus - MIC*    CIPROFLOXACIN <=0.5 SENSITIVE Sensitive     ERYTHROMYCIN <=0.25 SENSITIVE Sensitive  GENTAMICIN <=0.5 SENSITIVE Sensitive     OXACILLIN <=0.25 SENSITIVE Sensitive     TETRACYCLINE <=1 SENSITIVE Sensitive     VANCOMYCIN 1 SENSITIVE Sensitive     TRIMETH/SULFA <=10 SENSITIVE Sensitive     CLINDAMYCIN <=0.25 SENSITIVE Sensitive     RIFAMPIN <=0.5 SENSITIVE Sensitive     Inducible Clindamycin NEGATIVE Sensitive     * FEW STAPHYLOCOCCUS AUREUS  Gastrointestinal Panel by PCR , Stool     Status: Abnormal   Collection Time: 11/03/16 10:04 AM  Result Value Ref Range Status   Campylobacter species NOT DETECTED NOT DETECTED Final   Plesimonas shigelloides NOT DETECTED NOT DETECTED Final   Salmonella species NOT DETECTED NOT DETECTED Final   Yersinia enterocolitica NOT DETECTED NOT DETECTED Final   Vibrio species NOT DETECTED NOT DETECTED Final   Vibrio cholerae NOT DETECTED NOT DETECTED Final   Enteroaggregative E coli (EAEC) NOT DETECTED NOT DETECTED Final   Enteropathogenic E coli (EPEC) DETECTED (A) NOT DETECTED Final    Comment: RESULT CALLED TO, READ BACK BY AND VERIFIED WITH: SUSANA PATINO 11/03/16 @ 1411  MLK    Enterotoxigenic E coli (ETEC) NOT DETECTED NOT DETECTED Final   Shiga like toxin producing E coli (STEC) NOT DETECTED NOT DETECTED Final   Shigella/Enteroinvasive E coli (EIEC) NOT DETECTED NOT DETECTED Final   Cryptosporidium NOT DETECTED NOT DETECTED Final   Cyclospora cayetanensis NOT DETECTED NOT DETECTED Final   Entamoeba histolytica NOT DETECTED NOT DETECTED Final   Giardia lamblia NOT DETECTED NOT DETECTED Final   Adenovirus F40/41 NOT DETECTED NOT DETECTED Final   Astrovirus NOT DETECTED NOT DETECTED Final   Norovirus GI/GII NOT  DETECTED NOT DETECTED Final   Rotavirus A NOT DETECTED NOT DETECTED Final   Sapovirus (I, II, IV, and V) NOT DETECTED NOT DETECTED Final  C difficile quick scan w PCR reflex     Status: Abnormal   Collection Time: 11/03/16 10:04 AM  Result Value Ref Range Status   C Diff antigen POSITIVE (A) NEGATIVE Final   C Diff toxin NEGATIVE NEGATIVE Final   C Diff interpretation Results are indeterminate. See PCR results.  Final  Clostridium Difficile by PCR     Status: Abnormal   Collection Time: 11/03/16 10:04 AM  Result Value Ref Range Status   Toxigenic C Difficile by pcr POSITIVE (A) NEGATIVE Final    Comment: Positive for toxigenic C. difficile with little to no toxin production. Only treat if clinical presentation suggests symptomatic illness.      Scheduled Meds: . liver oil-zinc oxide   Topical TID  . metoprolol tartrate  25 mg Oral BID  . tamsulosin  0.4 mg Oral Daily  . vancomycin  125 mg Oral Q6H   Continuous Infusions: . sodium chloride 50 mL/hr at 11/03/16 1207  . ampicillin-sulbactam (UNASYN) IV Stopped (11/04/16 6144)    Assessment/Plan:  1. Acute diverticulitis with gas formation. Continue IV Unasyn at this point. Reevaluate tomorrow to see how he is doing on whether or not I need to repeat a CT scan. 2. Diarrhea. Stool positive for C. Difficile. Oral vancomycin started yesterday. Start by mouth vancomycin. This will need to continue for 10 days after antibiotics for the diverticulitis stopped. 3. Obstructive nephrolithiasis on the left with slight hydronephrosis. Lithotripsy done today. 4. Clinical sepsis with left back abscess, leukocytosis and tachycardia. Switched to [employer]. Will need at least 14 days of treatment total upon discharge home. Will switch to Augmentin upon going home. Cultures growing sensitive Staphylococcus aureus.  5. Acute rhabdomyolysis. Improved. 6. Wide complex tachycardia nonsustained. Cardiology thinks this could be paroxysmal atrial fibrillation  with aberrancy. Patient on metoprolol. Aspirin on hold secondary to the lithotripsy. Can consider restarting soon. 7. Hypomagnesemia. Replaced 8. Hypokalemia replaced 9. History of CLL with chronically elevated white count 10. Erythema on the buttock. Desitin cream started.  Code Status:     Code Status Orders        Start     Ordered   10/29/16 1526  Full code  Continuous     10/29/16 1525    Code Status History    Date Active Date Inactive Code Status Order ID Comments User Context   This patient has a current code status but no historical code status.     Family Communication: Wife at the bedside Disposition Plan: With the diverticulitis likely will have a longer hospital course, now also with C. difficile.  Antibiotics:  Unasyn  By mouth vancomycin  Time spent: 24 minutes  Loletha Grayer  [employer]

## 2016-11-04 NOTE — Progress Notes (Signed)
Per [facility]ons liaison [employer] authorization is good through Sunday 11/07/16. If patient does not discharge by 11/07/16 then a new [employer] SNF authorization will have to be started and obtained. Clinical Social Worker (CSW) will continue to follow and assist as needed.   [employer], LCSW [phone]

## 2016-11-04 NOTE — Progress Notes (Signed)
Patient had procedure this shift. AxOx4. Continues not notifying staff of incontinent episodes. [place]in applied to affected areas per order. Refused PT this shift. Complaints of pain, refused pain medication. Dsg Change x 2 this shift with foul odor. No other complaints of pain post procedure. Requesting food

## 2016-11-04 NOTE — Progress Notes (Signed)
His chief complaint today is weakness and difficulty getting out of bed.  He has had lithotripsy today and says that his left lower abdomen is feeling much better.  He is tolerating his regular diet satisfactorily.  He is having diarrhea and had positive C. difficile test.  I see that his intravenous vancomycin and Zosyn have been discontinued and he is now receiving intravenous Unasyn and oral vancomycin he reports a dressing on his back was just changed shortly before I arrived.  On examination .  He he is awake alert and oriented  Blood pressure 121/52, pulse rate 95, respiratory rate 14, O2 saturation 97%   He has much difficulty turning from side to side in bed.  The dressing on his back is dry and there is no apparent swelling.  With him in the supine position the abdomen was found to be very obese and is soft and still has some residual moderate degree of left lower quadrant tenderness  Impression #1 subcutaneous abscess of the back is improved 2.  Diverticulitis improved 3.  Status post lithotripsy for treatment of ureteral calculus improved 4.  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  Also consider follow-up CT scan of abdomen and pelvis to rule out a persistent abscess prior to discontinuing intravenous antibiotic.  I advised him to work on walking for exercise.  This was a greater than 25 minute visit mostly counseling

## 2016-11-05 ENCOUNTER — Inpatient Hospital Stay: Payer: BLUE CROSS/BLUE SHIELD

## 2016-11-05 ENCOUNTER — Encounter: Payer: Self-pay | Admitting: Urology

## 2016-11-05 LAB — CBC
HCT: 42.3 % (ref 40.0–52.0)
Hemoglobin: 14.1 g/dL (ref 13.0–18.0)
MCH: 31 pg (ref 26.0–34.0)
MCHC: 33.4 g/dL (ref 32.0–36.0)
MCV: 92.9 fL (ref 80.0–100.0)
PLATELETS: 328 10*3/uL (ref 150–440)
RBC: 4.55 MIL/uL (ref 4.40–5.90)
RDW: 13.3 % (ref 11.5–14.5)
WBC: 28.5 10*3/uL — ABNORMAL HIGH (ref 3.8–10.6)

## 2016-11-05 LAB — BASIC METABOLIC PANEL
Anion gap: 6 (ref 5–15)
BUN: 14 mg/dL (ref 6–20)
CHLORIDE: 105 mmol/L (ref 101–111)
CO2: 28 mmol/L (ref 22–32)
CREATININE: 1.15 mg/dL (ref 0.61–1.24)
Calcium: 8.8 mg/dL — ABNORMAL LOW (ref 8.9–10.3)
GFR calc Af Amer: >60 mL/min (ref >60)
Glucose, Bld: 99 mg/dL (ref 65–99)
POTASSIUM: 3.8 mmol/L (ref 3.5–5.1)
SODIUM: 139 mmol/L (ref 135–145)

## 2016-11-05 MED ORDER — SODIUM CHLORIDE 0.9% FLUSH
10.0000 mL | Freq: Two times a day (BID) | INTRAVENOUS | Status: DC
Start: 2016-11-05 — End: 2016-11-09
  Administered 2016-11-06 – 2016-11-07 (×4): 10 mL
  Administered 2016-11-08: 40 mL
  Administered 2016-11-08 – 2016-11-09 (×2): 10 mL

## 2016-11-05 MED ORDER — ZINC OXIDE 11.3 % EX CREA
TOPICAL_CREAM | Freq: Three times a day (TID) | CUTANEOUS | Status: DC
  Administered 2016-11-05 – 2016-11-08 (×11): via TOPICAL
  Administered 2016-11-09: 1 via TOPICAL
  Filled 2016-11-05: qty 56

## 2016-11-05 MED ORDER — SODIUM CHLORIDE 0.9% FLUSH: 10.0000 mL | INTRAVENOUS | Status: DC | PRN

## 2016-11-05 MED ORDER — IOPAMIDOL (ISOVUE-300) INJECTION 61%
15.0000 mL | INTRAVENOUS | Status: AC
  Administered 2016-11-05 (×2): 15 mL via ORAL

## 2016-11-05 MED ORDER — IOPAMIDOL (ISOVUE-300) INJECTION 61%
100.0000 mL | Freq: Once | INTRAVENOUS | Status: AC | PRN
  Administered 2016-11-05: 100 mL via INTRAVENOUS

## 2016-11-05 NOTE — Progress Notes (Signed)
Urology Consult Follow Up  Subjective: Tolerated shockwave lithotripsy yesterday. Reports this morning that his left lower quadrant pain is still present but improved. He has not been able to strain his urine. He is currently drinking contrast for a CT scan today.  Anti-infectives: Anti-infectives    Start     Dose/Rate Route Frequency Ordered Stop   11/03/16 1500  vancomycin (VANCOCIN) 50 mg/mL oral solution 125 mg     125 mg Oral Every 6 hours 11/03/16 1403     11/03/16 1430  metroNIDAZOLE (FLAGYL) tablet 500 mg  Status:  Discontinued     500 mg Oral Every 8 hours 11/03/16 1353 11/03/16 1403   11/03/16 1430  Ampicillin-Sulbactam (UNASYN) 3 g in sodium chloride 0.9 % 100 mL IVPB     3 g 200 mL/hr over 30 Minutes Intravenous Every 6 hours 11/03/16 1408     11/01/16 1400  piperacillin-tazobactam (ZOSYN) IVPB 4.5 g  Status:  Discontinued     4.5 g 200 mL/hr over 30 Minutes Intravenous Every 8 hours 11/01/16 1020 11/01/16 1135   11/01/16 1400  piperacillin-tazobactam (ZOSYN) 4.5 g in dextrose 5 % 100 mL IVPB  Status:  Discontinued     4.5 g 200 mL/hr over 30 Minutes Intravenous Every 8 hours 11/01/16 1135 11/03/16 1331   10/29/16 1700  vancomycin (VANCOCIN) 1,250 mg in sodium chloride 0.9 % 250 mL IVPB  Status:  Discontinued     1,250 mg 166.7 mL/hr over 90 Minutes Intravenous Every 18 hours 10/29/16 1552 10/29/16 1553   10/29/16 1600  piperacillin-tazobactam (ZOSYN) IVPB 3.375 g  Status:  Discontinued     3.375 g 12.5 mL/hr over 240 Minutes Intravenous Every 8 hours 10/29/16 1552 11/01/16 1020   10/29/16 1600  vancomycin (VANCOCIN) 1,250 mg in sodium chloride 0.9 % 250 mL IVPB  Status:  Discontinued     1,250 mg 166.7 mL/hr over 90 Minutes Intravenous Every 12 hours 10/29/16 1553 11/03/16 1331   10/29/16 1230  piperacillin-tazobactam (ZOSYN) IVPB 3.375 g     3.375 g 100 mL/hr over 30 Minutes Intravenous  Once 10/29/16 1215 10/29/16 1307   10/29/16 1230  vancomycin (VANCOCIN) IVPB 1000  mg/200 mL premix     1,000 mg 200 mL/hr over 60 Minutes Intravenous  Once 10/29/16 1215 10/29/16 1344      Current Facility-Administered Medications  Medication Dose Route Frequency Provider Last Rate Last Dose  . 0.9 %  sodium chloride infusion   Intravenous Continuous Loletha Grayer, MD 50 mL/hr at 11/03/16 1207    . acetaminophen (TYLENOL) tablet 650 mg  650 mg Oral Q6H PRN Gouru, Aruna, MD      . Ampicillin-Sulbactam (UNASYN) 3 g in sodium chloride 0.9 % 100 mL IVPB  3 g Intravenous Q6H Wieting, Richard, MD 200 mL/hr at 11/05/16 1025 3 g at 11/05/16 1025  . enoxaparin (LOVENOX) injection 40 mg  40 mg Subcutaneous Q24H Hollice Espy, MD   40 mg at 11/04/16 2122  . HYDROcodone-acetaminophen (NORCO/VICODIN) 5-325 MG per tablet 1 tablet  1 tablet Oral Q4H PRN Loletha Grayer, MD   1 tablet at 11/04/16 2122  . metoprolol tartrate (LOPRESSOR) tablet 25 mg  25 mg Oral BID Paraschos, Alexander, MD   25 mg at 11/05/16 1024  . morphine 2 MG/ML injection 2 mg  2 mg Intravenous Q3H PRN Wieting, Richard, MD      . ondansetron (ZOFRAN) tablet 4 mg  4 mg Oral Q8H PRN Nicholes Mango, MD      .  tamsulosin (FLOMAX) capsule 0.4 mg  0.4 mg Oral Daily Loletha Grayer, MD   0.4 mg at 11/05/16 1024  . traMADol (ULTRAM) tablet 50 mg  50 mg Oral Q6H PRN Gouru, Aruna, MD   50 mg at 11/04/16 1925  . vancomycin (VANCOCIN) 50 mg/mL oral solution 125 mg  125 mg Oral Q6H Loletha Grayer, MD   125 mg at 11/05/16 0311  . zinc oxide (BALMEX) 11.3 % cream   Topical TID Loletha Grayer, MD         Objective: Vital signs in last 24 hours: Temp:  [97.7 F (36.5 C)-98.9 F (37.2 C)] 98.4 F (36.9 C) (06/14 2300) Pulse Rate:  [68-115] 68 (06/14 2300) Resp:  [14-20] 18 (06/14 2300) BP: (104-145)/(52-88) 144/62 (06/14 2300) SpO2:  [94 %-98 %] 98 % (06/14 2300)  Intake/Output from previous day: 06/14 0701 - 06/15 0700 In: 2230 [P.O.:480; I.V.:1150; IV Piggyback:600] Out: 50 [Urine:50] Intake/Output this  shift: No intake/output data recorded.   Physical Exam  Constitutional: He is oriented to person, place, and time and well-developed, well-nourished, and in no distress.  Abdominal: Soft. He exhibits no distension.  obese  Neurological: He is alert and oriented to person, place, and time.  Skin: Skin is warm.  Vitals reviewed.   Lab Results:   Recent Labs  11/03/16 0429 11/05/16 0454  WBC 27.3* 28.5*  HGB 14.8 14.1  HCT 44.5 42.3  PLT 325 328   BMET  Recent Labs  11/03/16 0429 11/05/16 0454  NA 137 139  K 3.9 3.8  CL 102 105  CO2 25 28  GLUCOSE 100* 99  BUN 9 14  CREATININE 1.15 1.15  CALCIUM 9.0 8.8*    Assessment/ Plan:  1. 6 mm left distal ureteral stone- POP1 s/p ESWL.  Clinically improved. He'll be undergoing a CT scan today, we will) for residual stone burden on the scan.  2. Leukocytosis- likely from abscess, stable.  3. CKD- Baseline Cr 1.1, currently close to baseline   LOS: 7 days    Hollice Espy 11/05/2016

## 2016-11-05 NOTE — Progress Notes (Signed)
Arrived to discuss placement of PICC for home antibiotics including, risks, benefits, and alternatives. Refused PICC placement "for today". Stated "I'll reconsider it after I speak with the physician Monday." Instructed patient that IV Team would be available if he changed his mind today.

## 2016-11-05 NOTE — Consult Note (Signed)
[facility] Infectious Disease     Reason for Consult: Back Abscess, diverticulitis, C diff   Referring Physician: Earleen Newport, R Date of Admission:  10/29/2016   Active Problems:   Cellulitis and abscess of trunk   Left ureteral stone  HPI: William Kennedy is a 76 y.o. male admitted with a fall and weakness following drainage as otpt of back abscess. 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  Back culture grew MSSA. He also was found on CT to have ruptured diverticulitis and a renal stone with mild L hydro. Started on IV abx with vanco and zosyn and now on unasyn. Had surgery evaluation and not a great surgical candidate for diverticulitis. Had recurrent diarrhea and C diff +.  He had lithotripsy done 6/14.  BCx have been negative. Repeat CT shows persistent L ureteral calculus and mild hydro as well as minimally decreased pericolonic collection.  Currently remains very weak and deconditioned. Has continued LLQ abd pain and incontinence of stool with diarrhea.  Past Medical History:  Diagnosis Date  . Leukemia [facility])    Past Surgical History:  Procedure Laterality Date  . BACK SURGERY    . EXTRACORPOREAL SHOCK WAVE LITHOTRIPSY Left 11/04/2016   Procedure: EXTRACORPOREAL SHOCK WAVE LITHOTRIPSY (ESWL);  Surgeon: Hollice Espy, MD;  Location: [facility] ORS;  Service: Urology;  Laterality: Left;   Social History  Substance Use Topics  . Smoking status: Former Research scientist (life sciences)  . Smokeless tobacco: Former Systems developer  . Alcohol use No   Family History  Problem Relation Age of Onset  . Diabetes Mother   . Cancer Father     Allergies: No Known Allergies  Current antibiotics: Antibiotics Given (last 72 hours)    Date/Time Action Medication Dose Rate   11/02/16 1430 New Bag/Given   piperacillin-tazobactam (ZOSYN) 4.5 g in dextrose 5 % 100 mL IVPB 4.5 g 200 mL/hr   11/02/16 1555 New Bag/Given   vancomycin (VANCOCIN) 1,250 mg in sodium chloride 0.9 % 250 mL IVPB 1,250 mg 166.7 mL/hr   11/02/16 2029 New  Bag/Given   piperacillin-tazobactam (ZOSYN) 4.5 g in dextrose 5 % 100 mL IVPB 4.5 g 200 mL/hr   11/03/16 0410 New Bag/Given   vancomycin (VANCOCIN) 1,250 mg in sodium chloride 0.9 % 250 mL IVPB 1,250 mg 166.7 mL/hr   11/03/16 0509 New Bag/Given   piperacillin-tazobactam (ZOSYN) 4.5 g in dextrose 5 % 100 mL IVPB 4.5 g 200 mL/hr   11/03/16 1447 New Bag/Given   Ampicillin-Sulbactam (UNASYN) 3 g in sodium chloride 0.9 % 100 mL IVPB 3 g 200 mL/hr   11/03/16 1600 Given   vancomycin (VANCOCIN) 50 mg/mL oral solution 125 mg 125 mg    11/03/16 2217 Given   vancomycin (VANCOCIN) 50 mg/mL oral solution 125 mg 125 mg    11/03/16 2218 New Bag/Given   Ampicillin-Sulbactam (UNASYN) 3 g in sodium chloride 0.9 % 100 mL IVPB 3 g 200 mL/hr   11/04/16 0314 Given   vancomycin (VANCOCIN) 50 mg/mL oral solution 125 mg 125 mg    11/04/16 0314 New Bag/Given   Ampicillin-Sulbactam (UNASYN) 3 g in sodium chloride 0.9 % 100 mL IVPB 3 g 200 mL/hr   11/04/16 0851 New Bag/Given   Ampicillin-Sulbactam (UNASYN) 3 g in sodium chloride 0.9 % 100 mL IVPB 3 g 200 mL/hr   11/04/16 0852 Given   vancomycin (VANCOCIN) 50 mg/mL oral solution 125 mg 125 mg    11/04/16 1430 New Bag/Given   Ampicillin-Sulbactam (UNASYN) 3 g in sodium  chloride 0.9 % 100 mL IVPB 3 g 200 mL/hr   11/04/16 1642 Given   vancomycin (VANCOCIN) 50 mg/mL oral solution 125 mg 125 mg    11/04/16 1925 New Bag/Given   Ampicillin-Sulbactam (UNASYN) 3 g in sodium chloride 0.9 % 100 mL IVPB 3 g 200 mL/hr   11/04/16 2122 Given   vancomycin (VANCOCIN) 50 mg/mL oral solution 125 mg 125 mg    11/05/16 0311 Given   vancomycin (VANCOCIN) 50 mg/mL oral solution 125 mg 125 mg    11/05/16 0311 New Bag/Given   Ampicillin-Sulbactam (UNASYN) 3 g in sodium chloride 0.9 % 100 mL IVPB 3 g 200 mL/hr   11/05/16 1025 New Bag/Given   Ampicillin-Sulbactam (UNASYN) 3 g in sodium chloride 0.9 % 100 mL IVPB 3 g 200 mL/hr      MEDICATIONS: . enoxaparin (LOVENOX) injection  40  mg Subcutaneous Q24H  . metoprolol tartrate  25 mg Oral BID  . tamsulosin  0.4 mg Oral Daily  . vancomycin  125 mg Oral Q6H  . zinc oxide   Topical TID    Review of Systems - 11 systems reviewed and negative per HPI   OBJECTIVE: Temp:  [98.4 F (36.9 C)-98.9 F (37.2 C)] 98.4 F (36.9 C) (06/14 2300) Pulse Rate:  [68-115] 68 (06/14 2300) Resp:  [14-20] 18 (06/14 2300) BP: (121-145)/(52-62) 144/62 (06/14 2300) SpO2:  [95 %-98 %] 98 % (06/14 2300) Physical Exam  Constitutional: He is oriented to person, place, and time. Obese, disheveled. HENT: anicteric Mouth/Throat: Oropharynx is clear and dry . No oropharyngeal exudate.  Cardiovascular: Normal rate, regular rhythm and normal heart sounds Pulmonary/Chest: Effort normal and breath sounds normal. No respiratory distress. He has no wheezes.  Abdominal: obese, TTP LLQ Lymphadenopathy: He has no cervical adenopathy.  Neurological: He is alert and oriented to person, place, and time.  Skin: L upper back with incision site with penrose in place with purulent drainage. Mod surrounding redness and some candidial type skin changes  Psychiatric: He has a normal mood and affect. His behavior is normal.   LABS: Results for orders placed or performed during the hospital encounter of 10/29/16 (from the past 48 hour(s))  CBC     Status: Abnormal   Collection Time: 11/05/16  4:54 AM  Result Value Ref Range   WBC 28.5 (H) 3.8 - 10.6 K/uL   RBC 4.55 4.40 - 5.90 MIL/uL   Hemoglobin 14.1 13.0 - 18.0 g/dL   HCT 42.3 40.0 - 52.0 %   MCV 92.9 80.0 - 100.0 fL   MCH 31.0 26.0 - 34.0 pg   MCHC 33.4 32.0 - 36.0 g/dL   RDW 13.3 11.5 - 14.5 %   Platelets 328 150 - 440 K/uL  Basic metabolic panel     Status: Abnormal   Collection Time: 11/05/16  4:54 AM  Result Value Ref Range   Sodium 139 135 - 145 mmol/L   Potassium 3.8 3.5 - 5.1 mmol/L   Chloride 105 101 - 111 mmol/L   CO2 28 22 - 32 mmol/L   Glucose, Bld 99 65 - 99 mg/dL   BUN 14 6 - 20  mg/dL   Creatinine, Ser 1.15 0.61 - 1.24 mg/dL   Calcium 8.8 (L) 8.9 - 10.3 mg/dL   GFR calc non Af Amer >60 >60 mL/min   GFR calc Af Amer >60 >60 mL/min    Comment: (NOTE) The eGFR has been calculated using the CKD EPI equation. This calculation has not been validated in  all clinical situations. eGFR's persistently <60 mL/min signify possible Chronic Kidney Disease.    Anion gap 6 5 - 15   No components found for: ESR, C REACTIVE PROTEIN MICRO: Recent Results (from the past 720 hour(s))  Blood Culture (routine x 2)     Status: None   Collection Time: 10/29/16 12:41 PM  Result Value Ref Range Status   Specimen Description BLOOD RIGHT HAND  Final   Special Requests   Final    BOTTLES DRAWN AEROBIC AND ANAEROBIC Blood Culture adequate volume   Culture NO GROWTH 5 DAYS  Final   Report Status 11/03/2016 FINAL  Final  Blood Culture (routine x 2)     Status: None   Collection Time: 10/29/16 12:41 PM  Result Value Ref Range Status   Specimen Description BLOOD RIGHT HAND  Final   Special Requests   Final    BOTTLES DRAWN AEROBIC AND ANAEROBIC Blood Culture adequate volume   Culture NO GROWTH 5 DAYS  Final   Report Status 11/03/2016 FINAL  Final  Aerobic/Anaerobic Culture (surgical/deep wound)     Status: None   Collection Time: 10/29/16  1:11 PM  Result Value Ref Range Status   Specimen Description BACK upper middle  Final   Special Requests NONE  Final   Gram Stain NO WBC SEEN RARE GRAM POSITIVE COCCI IN PAIRS   Final   Culture   Final    FEW STAPHYLOCOCCUS AUREUS NO ANAEROBES ISOLATED Performed at [facility], [address], [place], [place] [address]    Report Status 11/03/2016 FINAL  Final   Organism ID, Bacteria STAPHYLOCOCCUS AUREUS  Final      Susceptibility   Staphylococcus aureus - MIC*    CIPROFLOXACIN <=0.5 SENSITIVE Sensitive     ERYTHROMYCIN <=0.25 SENSITIVE Sensitive     GENTAMICIN <=0.5 SENSITIVE Sensitive     OXACILLIN <=0.25 SENSITIVE Sensitive      TETRACYCLINE <=1 SENSITIVE Sensitive     VANCOMYCIN 1 SENSITIVE Sensitive     TRIMETH/SULFA <=10 SENSITIVE Sensitive     CLINDAMYCIN <=0.25 SENSITIVE Sensitive     RIFAMPIN <=0.5 SENSITIVE Sensitive     Inducible Clindamycin NEGATIVE Sensitive     * FEW STAPHYLOCOCCUS AUREUS  Anaerobic culture     Status: None   Collection Time: 10/30/16  8:05 AM  Result Value Ref Range Status   Specimen Description BACK MIDDLE UPPER BACK  Final   Special Requests NONE  Final   Culture   Final    NO ANAEROBES ISOLATED Performed at [facility], [address] [place], [place] [address]    Report Status 11/03/2016 FINAL  Final  Aerobic Culture (superficial specimen)     Status: None   Collection Time: 10/30/16  8:05 AM  Result Value Ref Range Status   Specimen Description BACK UPPER LEFT  Final   Special Requests ADDED 11/01/16  Final   Gram Stain   Final    FEW WBC PRESENT, PREDOMINANTLY PMN FEW GRAM POSITIVE COCCI IN PAIRS RARE GRAM POSITIVE RODS Performed at [facility], [place] [address], [place], [place] [address]    Culture FEW STAPHYLOCOCCUS AUREUS  Final   Report Status 11/04/2016 FINAL  Final   Organism ID, Bacteria STAPHYLOCOCCUS AUREUS  Final      Susceptibility   Staphylococcus aureus - MIC*    CIPROFLOXACIN <=0.5 SENSITIVE Sensitive     ERYTHROMYCIN <=0.25 SENSITIVE Sensitive     GENTAMICIN <=0.5 SENSITIVE Sensitive  OXACILLIN <=0.25 SENSITIVE Sensitive     TETRACYCLINE <=1 SENSITIVE Sensitive     VANCOMYCIN 1 SENSITIVE Sensitive     TRIMETH/SULFA <=10 SENSITIVE Sensitive     CLINDAMYCIN <=0.25 SENSITIVE Sensitive     RIFAMPIN <=0.5 SENSITIVE Sensitive     Inducible Clindamycin NEGATIVE Sensitive     * FEW STAPHYLOCOCCUS AUREUS  Gastrointestinal Panel by PCR , Stool     Status: Abnormal   Collection Time: 11/03/16 10:04 AM  Result Value Ref Range Status   Campylobacter species NOT DETECTED NOT DETECTED Final   Plesimonas shigelloides NOT DETECTED NOT DETECTED  Final   Salmonella species NOT DETECTED NOT DETECTED Final   Yersinia enterocolitica NOT DETECTED NOT DETECTED Final   Vibrio species NOT DETECTED NOT DETECTED Final   Vibrio cholerae NOT DETECTED NOT DETECTED Final   Enteroaggregative E coli (EAEC) NOT DETECTED NOT DETECTED Final   Enteropathogenic E coli (EPEC) DETECTED (A) NOT DETECTED Final    Comment: RESULT CALLED TO, READ BACK BY AND VERIFIED WITH: SUSANA PATINO 11/03/16 @ 1411  MLK    Enterotoxigenic E coli (ETEC) NOT DETECTED NOT DETECTED Final   Shiga like toxin producing E coli (STEC) NOT DETECTED NOT DETECTED Final   Shigella/Enteroinvasive E coli (EIEC) NOT DETECTED NOT DETECTED Final   Cryptosporidium NOT DETECTED NOT DETECTED Final   Cyclospora cayetanensis NOT DETECTED NOT DETECTED Final   Entamoeba histolytica NOT DETECTED NOT DETECTED Final   Giardia lamblia NOT DETECTED NOT DETECTED Final   Adenovirus F40/41 NOT DETECTED NOT DETECTED Final   Astrovirus NOT DETECTED NOT DETECTED Final   Norovirus GI/GII NOT DETECTED NOT DETECTED Final   Rotavirus A NOT DETECTED NOT DETECTED Final   Sapovirus (I, II, IV, and V) NOT DETECTED NOT DETECTED Final  C difficile quick scan w PCR reflex     Status: Abnormal   Collection Time: 11/03/16 10:04 AM  Result Value Ref Range Status   C Diff antigen POSITIVE (A) NEGATIVE Final   C Diff toxin NEGATIVE NEGATIVE Final   C Diff interpretation Results are indeterminate. See PCR results.  Final  Clostridium Difficile by PCR     Status: Abnormal   Collection Time: 11/03/16 10:04 AM  Result Value Ref Range Status   Toxigenic C Difficile by pcr POSITIVE (A) NEGATIVE Final    Comment: Positive for toxigenic C. difficile with little to no toxin production. Only treat if clinical presentation suggests symptomatic illness.    IMAGING: Dg Chest 1 View  Result Date: 10/29/2016 CLINICAL DATA:  Pain after fall EXAM: CHEST 1 VIEW COMPARISON:  None. FINDINGS: The heart size and mediastinal contours  are within normal limits. Both lungs are clear. The visualized skeletal structures are unremarkable. IMPRESSION: No active disease. Electronically Signed   By: Dorise Bullion III M.D   On: 10/29/2016 11:05   Dg Abd 1 View  Result Date: 11/01/2016 CLINICAL DATA:  Left ureteral stone, lithotripsy on Thursday. EXAM: ABDOMEN - 1 VIEW COMPARISON:  CT abdomen pelvis 10/31/2016. FINDINGS: Faint stone seen in the left kidney on 10/31/2016 are not readily visualized on the current exam. Distal left ureteral stone is again seen, as on yesterday's exam. Vascular calcifications and phleboliths are seen as well. Bowel gas pattern is unremarkable. IMPRESSION: 1. Distal left ureteral stone, as on 10/31/2016. 2. Faint left renal stones seen yesterday are poorly visualized today. Electronically Signed   By: Lorin Picket M.D.   On: 11/01/2016 10:44   Ct Abdomen Pelvis W Contrast  Result Date:  11/05/2016 CLINICAL DATA:  Diverticulitis. Renal stone. Improved left lower quadrant pain. EXAM: CT ABDOMEN AND PELVIS WITH CONTRAST TECHNIQUE: Multidetector CT imaging of the abdomen and pelvis was performed using the standard protocol following bolus administration of intravenous contrast. CONTRAST:  118m ISOVUE-300 IOPAMIDOL (ISOVUE-300) INJECTION 61% COMPARISON:  10/31/2016 FINDINGS: Lower chest: Minimal dependent atelectasis in the lung bases. No pleural effusion. Coronary artery atherosclerosis. Hepatobiliary: No focal liver abnormality is seen. No gallstones, gallbladder wall thickening, or biliary dilatation. Pancreas: Unremarkable. Spleen: Unremarkable. Adrenals/Urinary Tract: Unchanged 6.8 x 5.2 cm partially peripherally calcified cystic lesion arising from the right adrenal gland which may reflect the sequelae of remote hemorrhage or infection. Unchanged slight left adrenal gland thickening. Unchanged 10 cm interpolar right renal cyst and 1.7 cm left upper pole renal cyst. 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 Unchanged two 3 mm nonobstructing calculi in the upper pole/ interpolar left kidney. Unchanged 6 mm distal left ureteral calculus with persistent mild left hydroureteronephrosis. No right renal calculi or hydronephrosis. Unremarkable bladder. Stomach/Bowel: The stomach is within normal limits. There is no evidence of bowel obstruction. Extensive left-sided colonic diverticulosis is again seen with persistent moderate sigmoid colon wall thickening and surrounding inflammatory stranding, similar to prior. The previously described gas and fluid collection associated with the posterior wall of the sigmoid colon is stable to minimally smaller than on the prior study, measuring 4.3 x 2.6 cm. There is now enteric contrast material within this collection indicating communication with the bowel lumen. No new fluid or gas collections are identified. The appendix is unremarkable. Vascular/Lymphatic: Abdominal aortic atherosclerosis without aneurysm. Unchanged 9 mm short axis left common iliac lymph noted. 12 mm short axis left external iliac lymph node is at most minimally larger than on the recent prior study. Subcentimeter short axis para-aortic lymph nodes are unchanged. Reproductive: Marked prostatic enlargement. Other: No intraperitoneal free fluid. 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 IMPRESSION: 1. Unchanged distal left ureteral calculus with mild hydroureteronephrosis. 2. Similar appearance of acute sigmoid diverticulitis. Stable to minimally decreased size of pericolonic collection/contained perforation, now containing enteric contrast material. 3.  Aortic Atherosclerosis (ICD10-I70.0). Electronically Signed   By: ALogan BoresM.D.   On: 11/05/2016 12:04   Dg Knee Complete 4 Views Right  Result Date: 10/29/2016 CLINICAL DATA:  Pain after fall. EXAM: RIGHT KNEE - COMPLETE 4+ VIEW COMPARISON:  None. FINDINGS: No evidence of fracture, dislocation, or joint effusion. No evidence  of arthropathy or other focal bone abnormality. Soft tissues are unremarkable. IMPRESSION: Negative. Electronically Signed   By: DDorise BullionIII M.D   On: 10/29/2016 11:04   Ct Renal Stone Study  Result Date: 10/31/2016 CLINICAL DATA:  Left flank/left lower quadrant abdominal pain EXAM: CT ABDOMEN AND PELVIS WITHOUT CONTRAST TECHNIQUE: Multidetector CT imaging of the abdomen and pelvis was performed following the standard protocol without IV contrast. COMPARISON:  08/21/2009 FINDINGS: Lower chest: Lung bases are clear. Hepatobiliary: Unenhanced liver is unremarkable. Gallbladder is unremarkable. No intrahepatic or extrahepatic ductal dilatation. Pancreas: Within normal limits. Spleen: Within normal limits. Adrenals/Urinary Tract: 6.3 cm right renal cyst with rim calcification (series 2/ image 25), unchanged. Mild thickening of the left adrenal gland, unchanged. 10.3 cm posterior right upper pole renal cyst (series 2/ image 37), previously 8.0 cm. No renal calculi or hydronephrosis. 1.7 cm left upper pole renal cyst (series 2/ image 37). Two 3 mm nonobstructing left upper pole renal calculi. Mild left hydroureteronephrosis. Associated 6 mm distal left ureteral calculus (coronal image 102).  Bladder is mildly thick-walled although underdistended. Stomach/Bowel: Stomach is within normal limits. No evidence bowel obstruction. Normal appendix (series 2/ image 60). Sigmoid diverticulosis with associated diverticulitis and a 4.4 x 2.8 cm pericolonic fluid/ gas collection (series 2/ image 73), predominantly gas. This does not reflect a drainable fluid collection/abscess. No free air. Vascular/Lymphatic: No evidence of abdominal aortic aneurysm. Atherosclerotic calcifications of the abdominal aorta and branch vessels. Small retroperitoneal lymph nodes, including an 8 mm short axis left common iliac node (series 2/ image 56), previously 7 mm in 2011. Reproductive: Marked prostatomegaly. Other: No abdominopelvic  ascites. Musculoskeletal: Degenerative changes of the visualized thoracolumbar spine, most prominent at L4-5. IMPRESSION: 6 mm distal left ureteral calculus with associated mild left hydroureteronephrosis. Two additional nonobstructing left upper pole renal calculi measuring up to 3 mm. Acute sigmoid diverticulitis, with adjacent 4.4 x 2.8 cm pericolonic fluid/ gas collection, predominantly gas. This does not reflect a drainable fluid collection/ abscess given the size and lack of fluid component. No free air. Additional ancillary findings, as above. Electronically Signed   By: Julian Hy M.D.   On: 10/31/2016 11:28    Assessment:   CHUNG CHAGOYA is a 76 y.o. male with CLL admitted with MSSA L upper back abscess and found also to have ruptured diverticulitis with abscess and now C diff as well.   He is very weak and deconditioned. Has a ureteral stone with mild hydro as well.  Day 8 of IV abx for the abscess and diverticuli Day 3 or oral vanco for C diff Recommendations Cont unasyn and oral vanco Place PICC Given CLL check IG levels and if low can give IVIG  Agree with Dr Tamala Julian that likely needs another 10-14 days of IV abx for these 2 infections  Thank you very much for allowing me to participate in the care of this patient. Please call with questions.   Cheral Marker. Ola Spurr, MD

## 2016-11-05 NOTE — Progress Notes (Signed)
Peripherally Inserted Central Catheter/Midline Placement  The IV Nurse has discussed with the patient and/or persons authorized to consent for the patient, the purpose of this procedure and the potential benefits and risks involved with this procedure.  The benefits include less needle sticks, lab draws from the catheter, and the patient may be discharged home with the catheter. Risks include, but not limited to, infection, bleeding, blood clot (thrombus formation), and puncture of an artery; nerve damage and irregular heartbeat and possibility to perform a PICC exchange if needed/ordered by physician.  Alternatives to this procedure were also discussed.  Bard Power PICC patient education guide, fact sheet on infection prevention and patient information card has been provided to patient /or left at bedside.    PICC/Midline Placement Documentation  PICC Single Lumen 98/42/10 PICC Right Basilic 46 cm 0 cm (Active)  Indication for Insertion or Continuance of Line Home intravenous therapies (PICC only) 11/05/2016  4:25 PM  Exposed Catheter (cm) 0 cm 11/05/2016  4:25 PM  Site Assessment Clean;Dry;Intact 11/05/2016  4:25 PM  Line Status Blood return noted;Flushed;Saline locked 11/05/2016  4:25 PM  Dressing Type Transparent 11/05/2016  4:25 PM  Dressing Status Clean;Dry;Intact;Antimicrobial disc in place 11/05/2016  4:25 PM  Dressing Intervention New dressing 11/05/2016  4:25 PM  Dressing Change Due 11/12/16 11/05/2016  4:25 PM       Aldona Lento L 11/05/2016, 4:46 PM

## 2016-11-05 NOTE — Progress Notes (Signed)
Patient ID: William Kennedy, male   DOB: 1940-05-25, 76 y.o.   MRN: [id number]  Called by the nurse that the patient refused the PICC line.  I spoke with the patient about the need for IV antibiotics and this is how we do it. If he is better refused the PICC line and we can switch him to oral antibiotics and now to rehabilitation tomorrow.  Called back by the nurse that he consented to the PICC line at this time.  Patient likely still can go out to rehabilitation over the weekend and continue treatment over there.  Dr. Loletha Grayer

## 2016-11-05 NOTE — Progress Notes (Addendum)
Per patient's request to contact Dr. Erlene Quan regarding plans for further procedures regarding urination b/c they are unclear. No note at this time from MD of previous procedure/plan. Per conversation with Erlene Quan, no plans at this time, decision will be made post CT completion.

## 2016-11-05 NOTE — Progress Notes (Signed)
PICC line approved for use per MD [facility]

## 2016-11-05 NOTE — Progress Notes (Signed)
This Probation officer was notified that patient has refused PICC line insertion and lab draw. Notified all MDs of refusal. Spoke with patient and spouse continued to refuse. Director spoke with patient and spouse and agreed to PICC and labs

## 2016-11-05 NOTE — Progress Notes (Signed)
Chief complaint is generalized weakness pain and slow progress. He had his lithotripsy yesterday and he reports improvement in the left lower quadrant abdominal pain. He reports is voiding satisfactorily although has not been aware of whether is passed any stones or not. He is incontinent.  He reports tolerating a solid diet satisfactorily with no nausea or vomiting. He reports he has continued to have some diarrhea. He remains weak and unable to walk. He continues to have multiple bodily aches and pains. He reports breathing satisfactorily and was seen in the morning dictated later in the day.  On examination vital signs stable. He is awake alert and oriented and resting in the hospital bed. He does have significant difficulty turning from side to side.  The back was examined and the dressing was changed. The Penrose drain remains intact. The opening of the skin is somewhat enlarged at approximately 1.5 cm. There is continued thin cloudy purulent drainage onto the dressing. There is persistent rash formation of the skin with tenderness   A new dressing was applied.  Abdomen is obese and soft with mild left lower quadrant tenderness. Can palpate somewhat more deeply than several days ago.  I reviewed his CT report and images seeing the area of the sigmoid diverticulitis with localized perforation into mesentery with gas and also small amount of oral contrast within the site of perforation.. Also noted the distal ureteral calculus  I reviewed Dr. Blane Ohara note suggesting 10-14 days of intravenous Unasyn and continued oral vancomycin. Also reviewed Dr. Marshia Ly note indicating a plan of working towards transfer to a rehabilitation facility. Patient now has PICC line in place.Marland Kitchen  CLINICAL DATA: Metabolic panel with creatinine of 1.15. CBC with white blood count of 28,500, hemoglobin 14.1  Diagnosis #1 improvement and the large abscess of the back but with persistent purulent drainage  #2  diverticulitis of the sigmoid colon with a focal perforation with minimal change since last week on CT scan.  #3 persistent evidence of distal left ureteral calculus 1 day after lithotripsy  #4 Clostridium difficile colitis  #5 Chronic lymphocytic leukemia  I agree with continuing with intravenous antibiotic treatment of the back abscess and diverticulitis. It would probably be helpful to do a follow-up CT scan prior to completing his course of intravenous antibiotics. If his diverticulitis does not improve he may potentially require sigmoid resection but appears he may be high risk for surgery. He was encouraged to work on walking for exercise to try to regain strength. Suggest continuing with diet as tolerated. Will need follow-up urology consultation. Work towards transfer to rehabilitation facility.  Today's visit evaluation and management was greater than 40 minute mostly counseling

## 2016-11-05 NOTE — Progress Notes (Signed)
Dr. Erlene Quan called with a verbal order to make NPO at midnight, Dr. Jeffie Pollock with access on 6/16 to make a plan.

## 2016-11-05 NOTE — Progress Notes (Addendum)
Per MD patient is not medically stable for D/C today. Plan is for patient to D/C to [facility]. Per William Kennedy [facility] liaison [facility] authorization has been received and it is good through Sunday 11/07/16. If patient is still at [facility] Monday 11/08/16 then a new [employer] authorization will have to be started. William Kennedy is aware patient may get a PICC and IV Abx at D/C. MD and RN aware of above. Patient and his wife are aware of above. CSW will continue to follow and assist as needed.   [employer], LCSW [phone]

## 2016-11-05 NOTE — Progress Notes (Signed)
Physical Therapy Treatment Patient Details Name: William Kennedy MRN: [id number] DOB: 1941/01/19 Today's Date: 11/05/2016    History of Present Illness 76 yo male with onset of sepsis from abscess to back creating cellulitis, was drained by surgeon two weeks ago but now greater infection.  Pt has leukocytosis, acute kidney injury, tachycardia with MD clearing him for serious arrythmia.    PT Comments    Pt did considerably better this session than any he has had thus far.  He was actually able to ambulate >10 ft using a walker and was able to transfer to standing and maintain balance w/o direct assist.  He continues to have issues with pain on L flank (and abdomen/back) and is very fatigued with the much increased activity today but ultimately showed great effort and improvement this session.   Follow Up Recommendations  SNF     Equipment Recommendations  None recommended by PT    Recommendations for Other Services       Precautions / Restrictions Precautions Precautions: Fall Restrictions Weight Bearing Restrictions: No    Mobility  Bed Mobility Overal bed mobility: Needs Assistance Bed Mobility: Supine to Sit     Supine to sit: Min guard     General bed mobility comments: Pt struggled to get to sitting, but was able to get up w/o direct physical assist with slow and UE reliant strategy  Transfers Overall transfer level: Needs assistance Equipment used: Rolling walker (2 wheeled) Transfers: Sit to/from Stand Sit to Stand: Min guard         General transfer comment: PT ab le to get up from sitting on 2 seperate efforts (bed, chair) he needed heavy cuing and close assistance/cuing but ultimately was able to get himself up with good effort and no direct assist.   Ambulation/Gait Ambulation/Gait assistance: Min guard;Min assist Ambulation Distance (Feet): 12 Feet Assistive device: Rolling walker (2 wheeled)       General Gait Details: Pt was able to walk ~5 ft on  the first attempt and after using the bathroom he was able to walk ~12 ft w/o direct assist but close guarding and heavy cuing for assist   Stairs            Wheelchair Mobility    Modified Rankin (Stroke Patients Only)       Balance Overall balance assessment: Needs assistance Sitting-balance support: Bilateral upper extremity supported Sitting balance-Leahy Scale: Good     Standing balance support: Bilateral upper extremity supported Standing balance-Leahy Scale: Fair Standing balance comment: Pt needing cues to use walker properly and stay fully upright                            Cognition Arousal/Alertness: Awake/alert Behavior During Therapy: WFL for tasks assessed/performed Overall Cognitive Status: Within Functional Limits for tasks assessed                                        Exercises General Exercises - Lower Extremity Ankle Circles/Pumps: AROM;10 reps Long Arc Quad: Strengthening;10 reps Heel Slides: Strengthening;10 reps    General Comments        Pertinent Vitals/Pain Pain Assessment: 0-10 Pain Score: 8  Pain Location:  (L abdomen, flank and back)    Home Living  Prior Function            PT Goals (current goals can now be found in the care plan section) Progress towards PT goals: Progressing toward goals    Frequency    Min 2X/week      PT Plan Current plan remains appropriate    Co-evaluation              AM-PAC PT "6 Clicks" Daily Activity  Outcome Measure  Difficulty turning over in bed (including adjusting bedclothes, sheets and blankets)?: A Little Difficulty moving from lying on back to sitting on the side of the bed? : A Lot Difficulty sitting down on and standing up from a chair with arms (e.g., wheelchair, bedside commode, etc,.)?: A Lot Help needed moving to and from a bed to chair (including a wheelchair)?: A Little Help needed walking in hospital  room?: A Lot Help needed climbing 3-5 steps with a railing? : Total 6 Click Score: 13    End of Session Equipment Utilized During Treatment: Gait belt Activity Tolerance: Patient limited by fatigue     PT Visit Diagnosis: Muscle weakness (generalized) (M62.81);History of falling (Z91.81);Pain     Time: 7445-1460 PT Time Calculation (min) (ACUTE ONLY): 29 min  Charges:  $Gait Training: 8-22 mins $Therapeutic Exercise: 8-22 mins                    G Codes:       Kreg Shropshire, DPT 11/05/2016, 4:08 PM

## 2016-11-05 NOTE — Progress Notes (Signed)
Patient ID: William Kennedy, male   DOB: 07-Jun-1940, 76 y.o.   MRN: [id number]    [facility] PROGRESS NOTE  William Kennedy NUU:[id number] DOB: June 16, 1940 DOA: 10/29/2016 PCP: [name], MD  HPI/Subjective: Patient was seen this morning and was having a lot of pain in his abdomen. He states he feels a little bit better than yesterday but states he still has not passed the stone yet. Had 4 episodes of diarrhea yesterday. Still cannot control it.  Objective: Vitals:   11/04/16 2022 11/04/16 2300  BP: (!) 145/59 (!) 144/62  Pulse: (!) 115 68  Resp: 20 18  Temp: 98.9 F (37.2 C) 98.4 F (36.9 C)    Filed Weights   10/29/16 0957  Weight: 122.5 kg (270 lb)    ROS: Review of Systems  Constitutional: Negative for chills and fever.  Eyes: Negative for blurred vision.  Respiratory: Negative for cough and shortness of breath.   Cardiovascular: Negative for chest pain.  Gastrointestinal: Positive for abdominal pain and diarrhea. Negative for nausea and vomiting.  Genitourinary: Positive for flank pain. Negative for dysuria.  Musculoskeletal: Negative for joint pain.  Neurological: Negative for dizziness and headaches.   Exam: Physical Exam  Constitutional: He is oriented to person, place, and time.  HENT:  Nose: No mucosal edema.  Mouth/Throat: No oropharyngeal exudate or posterior oropharyngeal edema.  Eyes: Conjunctivae, EOM and lids are normal. Pupils are equal, round, and reactive to light.  Neck: No JVD present. Carotid bruit is not present. No edema present. No thyroid mass and no thyromegaly present.  Cardiovascular: Regular rhythm, S1 normal and S2 normal.  Exam reveals no gallop.   No murmur heard. Pulses:      Dorsalis pedis pulses are 2+ on the right side, and 2+ on the left side.  Respiratory: No respiratory distress. He has decreased breath sounds in the right lower field and the left lower field. He has no wheezes. He has no rhonchi. He has no rales.   GI: Soft. Bowel sounds are normal. He exhibits distension. There is tenderness in the left lower quadrant.  Musculoskeletal:       Right ankle: He exhibits swelling.       Left ankle: He exhibits swelling.  Lymphadenopathy:    He has no cervical adenopathy.  Neurological: He is alert and oriented to person, place, and time. No cranial nerve deficit.  Skin: Skin is warm. Nails show no clubbing.  Left upper back covered with bandage  Psychiatric: He has a normal mood and affect.      Data Reviewed: Basic Metabolic Panel:  Recent Labs Lab 10/30/16 0418 10/31/16 0424 11/01/16 0325 11/03/16 0429 11/05/16 0454  NA 136 139 138 137 139  K 3.5 3.7 3.1* 3.9 3.8  CL 99* 104 101 102 105  CO2 26 30 30 25 28   GLUCOSE 96 103* 111* 100* 99  BUN 24* 21* 15 9 14   CREATININE 1.23 1.26* 1.20 1.15 1.15  CALCIUM 9.0 8.9 8.7* 9.0 8.8*  MG 1.9  --   --   --   --    Liver Function Tests:  Recent Labs Lab 10/30/16 0418  AST 58*  ALT 54  ALKPHOS 56  BILITOT 1.0  PROT 6.4*  ALBUMIN 3.1*   CBC:  Recent Labs Lab 10/30/16 0418 10/31/16 0424 11/01/16 0325 11/03/16 0429 11/05/16 0454  WBC 31.7* 27.7* 25.8* 27.3* 28.5*  HGB 15.2 14.2 14.5 14.8 14.1  HCT 45.1 42.9 42.8 44.5 42.3  MCV 93.1 93.6 92.8 93.5 92.9  PLT 325 335 321 325 328   Cardiac Enzymes:  Recent Labs Lab 10/30/16 0418  CKTOTAL 320     Recent Results (from the past 240 hour(s))  Blood Culture (routine x 2)     Status: None   Collection Time: 10/29/16 12:41 PM  Result Value Ref Range Status   Specimen Description BLOOD RIGHT HAND  Final   Special Requests   Final    BOTTLES DRAWN AEROBIC AND ANAEROBIC Blood Culture adequate volume   Culture NO GROWTH 5 DAYS  Final   Report Status 11/03/2016 FINAL  Final  Blood Culture (routine x 2)     Status: None   Collection Time: 10/29/16 12:41 PM  Result Value Ref Range Status   Specimen Description BLOOD RIGHT HAND  Final   Special Requests   Final    BOTTLES DRAWN  AEROBIC AND ANAEROBIC Blood Culture adequate volume   Culture NO GROWTH 5 DAYS  Final   Report Status 11/03/2016 FINAL  Final  Aerobic/Anaerobic Culture (surgical/deep wound)     Status: None   Collection Time: 10/29/16  1:11 PM  Result Value Ref Range Status   Specimen Description BACK upper middle  Final   Special Requests NONE  Final   Gram Stain NO WBC SEEN RARE GRAM POSITIVE COCCI IN PAIRS   Final   Culture   Final    FEW STAPHYLOCOCCUS AUREUS NO ANAEROBES ISOLATED Performed at [facility], [address] [place], [place] [address]    Report Status 11/03/2016 FINAL  Final   Organism ID, Bacteria STAPHYLOCOCCUS AUREUS  Final      Susceptibility   Staphylococcus aureus - MIC*    CIPROFLOXACIN <=0.5 SENSITIVE Sensitive     ERYTHROMYCIN <=0.25 SENSITIVE Sensitive     GENTAMICIN <=0.5 SENSITIVE Sensitive     OXACILLIN <=0.25 SENSITIVE Sensitive     TETRACYCLINE <=1 SENSITIVE Sensitive     VANCOMYCIN 1 SENSITIVE Sensitive     TRIMETH/SULFA <=10 SENSITIVE Sensitive     CLINDAMYCIN <=0.25 SENSITIVE Sensitive     RIFAMPIN <=0.5 SENSITIVE Sensitive     Inducible Clindamycin NEGATIVE Sensitive     * FEW STAPHYLOCOCCUS AUREUS  Anaerobic culture     Status: None   Collection Time: 10/30/16  8:05 AM  Result Value Ref Range Status   Specimen Description BACK MIDDLE UPPER BACK  Final   Special Requests NONE  Final   Culture   Final    NO ANAEROBES ISOLATED Performed at [facility], [address], [place], [place] [address]    Report Status 11/03/2016 FINAL  Final  Aerobic Culture (superficial specimen)     Status: None   Collection Time: 10/30/16  8:05 AM  Result Value Ref Range Status   Specimen Description BACK UPPER LEFT  Final   Special Requests ADDED 11/01/16  Final   Gram Stain   Final    FEW WBC PRESENT, PREDOMINANTLY PMN FEW GRAM POSITIVE COCCI IN PAIRS RARE GRAM POSITIVE RODS Performed at [facility], [place] [address] [place], [place] [address]     Culture FEW STAPHYLOCOCCUS AUREUS  Final   Report Status 11/04/2016 FINAL  Final   Organism ID, Bacteria STAPHYLOCOCCUS AUREUS  Final      Susceptibility   Staphylococcus aureus - MIC*    CIPROFLOXACIN <=0.5 SENSITIVE Sensitive     ERYTHROMYCIN <=0.25 SENSITIVE Sensitive     GENTAMICIN <=0.5 SENSITIVE Sensitive  OXACILLIN <=0.25 SENSITIVE Sensitive     TETRACYCLINE <=1 SENSITIVE Sensitive     VANCOMYCIN 1 SENSITIVE Sensitive     TRIMETH/SULFA <=10 SENSITIVE Sensitive     CLINDAMYCIN <=0.25 SENSITIVE Sensitive     RIFAMPIN <=0.5 SENSITIVE Sensitive     Inducible Clindamycin NEGATIVE Sensitive     * FEW STAPHYLOCOCCUS AUREUS  Gastrointestinal Panel by PCR , Stool     Status: Abnormal   Collection Time: 11/03/16 10:04 AM  Result Value Ref Range Status   Campylobacter species NOT DETECTED NOT DETECTED Final   Plesimonas shigelloides NOT DETECTED NOT DETECTED Final   Salmonella species NOT DETECTED NOT DETECTED Final   Yersinia enterocolitica NOT DETECTED NOT DETECTED Final   Vibrio species NOT DETECTED NOT DETECTED Final   Vibrio cholerae NOT DETECTED NOT DETECTED Final   Enteroaggregative E coli (EAEC) NOT DETECTED NOT DETECTED Final   Enteropathogenic E coli (EPEC) DETECTED (A) NOT DETECTED Final    Comment: RESULT CALLED TO, READ BACK BY AND VERIFIED WITH: SUSANA PATINO 11/03/16 @ 1411  MLK    Enterotoxigenic E coli (ETEC) NOT DETECTED NOT DETECTED Final   Shiga like toxin producing E coli (STEC) NOT DETECTED NOT DETECTED Final   Shigella/Enteroinvasive E coli (EIEC) NOT DETECTED NOT DETECTED Final   Cryptosporidium NOT DETECTED NOT DETECTED Final   Cyclospora cayetanensis NOT DETECTED NOT DETECTED Final   Entamoeba histolytica NOT DETECTED NOT DETECTED Final   Giardia lamblia NOT DETECTED NOT DETECTED Final   Adenovirus F40/41 NOT DETECTED NOT DETECTED Final   Astrovirus NOT DETECTED NOT DETECTED Final   Norovirus GI/GII NOT DETECTED NOT DETECTED Final   Rotavirus A NOT  DETECTED NOT DETECTED Final   Sapovirus (I, II, IV, and V) NOT DETECTED NOT DETECTED Final  C difficile quick scan w PCR reflex     Status: Abnormal   Collection Time: 11/03/16 10:04 AM  Result Value Ref Range Status   C Diff antigen POSITIVE (A) NEGATIVE Final   C Diff toxin NEGATIVE NEGATIVE Final   C Diff interpretation Results are indeterminate. See PCR results.  Final  Clostridium Difficile by PCR     Status: Abnormal   Collection Time: 11/03/16 10:04 AM  Result Value Ref Range Status   Toxigenic C Difficile by pcr POSITIVE (A) NEGATIVE Final    Comment: Positive for toxigenic C. difficile with little to no toxin production. Only treat if clinical presentation suggests symptomatic illness.      Scheduled Meds: . enoxaparin (LOVENOX) injection  40 mg Subcutaneous Q24H  . metoprolol tartrate  25 mg Oral BID  . tamsulosin  0.4 mg Oral Daily  . vancomycin  125 mg Oral Q6H  . zinc oxide   Topical TID   Continuous Infusions: . sodium chloride 50 mL/hr at 11/03/16 1207  . ampicillin-sulbactam (UNASYN) IV Stopped (11/05/16 1106)    Assessment/Plan:  1. Acute diverticulitis with gas formation. Repeat CAT scan she still shows diverticulitis but it does show contrast in the area which is likely a contain perforation. Case discussed with Dr. Ola Spurr infectious disease. PICC line to be placed for IV Unasyn. Please see stop date in the computer. 2. Diarrhea. Stool positive for C. Difficile. Oral vancomycin for 10 days after IV Unasyn stopped. 3. Obstructive nephrolithiasis on the left with slight hydronephrosis. Lithotripsy done yesterday. Stones still present on CT scan today. Urology following 4. Clinical sepsis with left back abscess, leukocytosis and tachycardia. Switched to [employer]. IV Unasyn will cover the sensitive staph aureus. 5.  Acute rhabdomyolysis. Improved. 6. Wide complex tachycardia nonsustained. Cardiology thinks this could be paroxysmal atrial fibrillation with aberrancy.  Patient on metoprolol. Aspirin on hold secondary to the lithotripsy. Can consider restarting soon once it is known that the patient will not need any further procedures. 7. Hypomagnesemia. Replaced 8. Hypokalemia replaced 9. History of CLL with chronically elevated white count 10. Erythema on the buttock. Desitin cream started.  Code Status:     Code Status Orders        Start     Ordered   10/29/16 1526  Full code  Continuous     10/29/16 1525    Code Status History    Date Active Date Inactive Code Status Order ID Comments User Context   This patient has a current code status but no historical code status.     Family Communication: Wife at the bedside Disposition Plan: Potentially out to rehabilitation over the weekend  Antibiotics:  Unasyn  By mouth vancomycin  Time spent: 25 minutes. Case discussed with Dr. Ola Spurr infectious disease  [place], [facility]

## 2016-11-05 NOTE — ED Notes (Signed)
Both sets of blood cultures drawn before antibiotics started.

## 2016-11-05 NOTE — Progress Notes (Signed)
While rounding unit [facility] visited with pt. Pt was sitting on the chair and his wife was at his bedside. Pt told [place] that he was feeling much better today than he has felt for awhile. Pt stated that his wife has been very supportive and has helped him cope with health challenges. Pt mentioned that his birthday was today. He turned 76 years old. [place] sung a happy birthday song for him, offered prayers and ministry of presence, which pt and his wife appreciated.    11/05/16 1500  Clinical Encounter Type  Visited With Patient;Family  Visit Type Initial;Spiritual support  Referral From [place];Other (Comment)

## 2016-11-05 NOTE — Progress Notes (Addendum)
Infectious Disease Long Term IV Antibiotic Orders  Diagnosis:MSSA back abscess, ruptured diverticulitis, C diff  Culture results MSSA wound C diff +  Allergies: No Known Allergies  Discharge antibiotics Unasyn 3 gm q 8 Hours for 10-14 days  Oral vancomycin 125 q 8 hours for 10 days after stopping all abx  PICC Care per protocol Labs weekly while on IV antibiotics      CBC w diff   Comprehensive met panel  Planned duration of antibiotics 2-3  days for unasyn Extra 10 days for the C diff after finishing all other antibiotics  Stop date  July 2 tenative  Follow up clinic date 2-3 weeks  FAX weekly labs to [phone]  Leonel Ramsay, MD

## 2016-11-05 NOTE — Progress Notes (Signed)
ANTIBIOTIC CONSULT NOTE - FOLLOW UP   Pharmacy Consult for Unasyn Indication: intra-abdominal infection   No Known Allergies  Patient Measurements: Height: 6\' 2"  (188 cm) Weight: 270 lb (122.5 kg) IBW/kg (Calculated) : 82.2 Adjusted Body Weight:   Vital Signs: Temp: 98.4 F (36.9 C) (06/14 2300) Temp Source: Oral (06/14 2300) BP: 144/62 (06/14 2300) Pulse Rate: 68 (06/14 2300) Intake/Output from previous day: 06/14 0701 - 06/15 0700 In: 2230 [P.O.:480; I.V.:1150; IV Piggyback:600] Out: 50 [Urine:50] Intake/Output from this shift: No intake/output data recorded.  Labs:  Recent Labs  11/03/16 0429 11/05/16 0454  WBC 27.3* 28.5*  HGB 14.8 14.1  PLT 325 328  CREATININE 1.15 1.15   Estimated Creatinine Clearance: 77.2 mL/min (by C-G formula based on SCr of 1.15 mg/dL). No results for input(s): VANCOTROUGH, VANCOPEAK, VANCORANDOM, GENTTROUGH, GENTPEAK, GENTRANDOM, TOBRATROUGH, TOBRAPEAK, TOBRARND, AMIKACINPEAK, AMIKACINTROU, AMIKACIN in the last 72 hours.   Microbiology: Recent Results (from the past 720 hour(s))  Blood Culture (routine x 2)     Status: None   Collection Time: 10/29/16 12:41 PM  Result Value Ref Range Status   Specimen Description BLOOD RIGHT HAND  Final   Special Requests   Final    BOTTLES DRAWN AEROBIC AND ANAEROBIC Blood Culture adequate volume   Culture NO GROWTH 5 DAYS  Final   Report Status 11/03/2016 FINAL  Final  Blood Culture (routine x 2)     Status: None   Collection Time: 10/29/16 12:41 PM  Result Value Ref Range Status   Specimen Description BLOOD RIGHT HAND  Final   Special Requests   Final    BOTTLES DRAWN AEROBIC AND ANAEROBIC Blood Culture adequate volume   Culture NO GROWTH 5 DAYS  Final   Report Status 11/03/2016 FINAL  Final  Aerobic/Anaerobic Culture (surgical/deep wound)     Status: None   Collection Time: 10/29/16  1:11 PM  Result Value Ref Range Status   Specimen Description BACK upper middle  Final   Special Requests  NONE  Final   Gram Stain NO WBC SEEN RARE GRAM POSITIVE COCCI IN PAIRS   Final   Culture   Final    FEW STAPHYLOCOCCUS AUREUS NO ANAEROBES ISOLATED Performed at [facility], [place] [address], [place], [place] [address]    Report Status 11/03/2016 FINAL  Final   Organism ID, Bacteria STAPHYLOCOCCUS AUREUS  Final      Susceptibility   Staphylococcus aureus - MIC*    CIPROFLOXACIN <=0.5 SENSITIVE Sensitive     ERYTHROMYCIN <=0.25 SENSITIVE Sensitive     GENTAMICIN <=0.5 SENSITIVE Sensitive     OXACILLIN <=0.25 SENSITIVE Sensitive     TETRACYCLINE <=1 SENSITIVE Sensitive     VANCOMYCIN 1 SENSITIVE Sensitive     TRIMETH/SULFA <=10 SENSITIVE Sensitive     CLINDAMYCIN <=0.25 SENSITIVE Sensitive     RIFAMPIN <=0.5 SENSITIVE Sensitive     Inducible Clindamycin NEGATIVE Sensitive     * FEW STAPHYLOCOCCUS AUREUS  Anaerobic culture     Status: None   Collection Time: 10/30/16  8:05 AM  Result Value Ref Range Status   Specimen Description BACK MIDDLE UPPER BACK  Final   Special Requests NONE  Final   Culture   Final    NO ANAEROBES ISOLATED Performed at [facility], [address] [place], [place] [address]    Report Status 11/03/2016 FINAL  Final  Aerobic Culture (superficial specimen)     Status: None   Collection Time: 10/30/16  8:05 AM  Result Value Ref Range Status   Specimen Description BACK UPPER LEFT  Final   Special Requests ADDED 11/01/16  Final   Gram Stain   Final    FEW WBC PRESENT, PREDOMINANTLY PMN FEW GRAM POSITIVE COCCI IN PAIRS RARE GRAM POSITIVE RODS Performed at [facility], [place] [address] [place], [place] [address]    Culture FEW STAPHYLOCOCCUS AUREUS  Final   Report Status 11/04/2016 FINAL  Final   Organism ID, Bacteria STAPHYLOCOCCUS AUREUS  Final      Susceptibility   Staphylococcus aureus - MIC*    CIPROFLOXACIN <=0.5 SENSITIVE Sensitive     ERYTHROMYCIN <=0.25 SENSITIVE Sensitive     GENTAMICIN <=0.5 SENSITIVE Sensitive      OXACILLIN <=0.25 SENSITIVE Sensitive     TETRACYCLINE <=1 SENSITIVE Sensitive     VANCOMYCIN 1 SENSITIVE Sensitive     TRIMETH/SULFA <=10 SENSITIVE Sensitive     CLINDAMYCIN <=0.25 SENSITIVE Sensitive     RIFAMPIN <=0.5 SENSITIVE Sensitive     Inducible Clindamycin NEGATIVE Sensitive     * FEW STAPHYLOCOCCUS AUREUS  Gastrointestinal Panel by PCR , Stool     Status: Abnormal   Collection Time: 11/03/16 10:04 AM  Result Value Ref Range Status   Campylobacter species NOT DETECTED NOT DETECTED Final   Plesimonas shigelloides NOT DETECTED NOT DETECTED Final   Salmonella species NOT DETECTED NOT DETECTED Final   Yersinia enterocolitica NOT DETECTED NOT DETECTED Final   Vibrio species NOT DETECTED NOT DETECTED Final   Vibrio cholerae NOT DETECTED NOT DETECTED Final   Enteroaggregative E coli (EAEC) NOT DETECTED NOT DETECTED Final   Enteropathogenic E coli (EPEC) DETECTED (A) NOT DETECTED Final    Comment: RESULT CALLED TO, READ BACK BY AND VERIFIED WITH: SUSANA PATINO 11/03/16 @ 1411  MLK    Enterotoxigenic E coli (ETEC) NOT DETECTED NOT DETECTED Final   Shiga like toxin producing E coli (STEC) NOT DETECTED NOT DETECTED Final   Shigella/Enteroinvasive E coli (EIEC) NOT DETECTED NOT DETECTED Final   Cryptosporidium NOT DETECTED NOT DETECTED Final   Cyclospora cayetanensis NOT DETECTED NOT DETECTED Final   Entamoeba histolytica NOT DETECTED NOT DETECTED Final   Giardia lamblia NOT DETECTED NOT DETECTED Final   Adenovirus F40/41 NOT DETECTED NOT DETECTED Final   Astrovirus NOT DETECTED NOT DETECTED Final   Norovirus GI/GII NOT DETECTED NOT DETECTED Final   Rotavirus A NOT DETECTED NOT DETECTED Final   Sapovirus (I, II, IV, and V) NOT DETECTED NOT DETECTED Final  C difficile quick scan w PCR reflex     Status: Abnormal   Collection Time: 11/03/16 10:04 AM  Result Value Ref Range Status   C Diff antigen POSITIVE (A) NEGATIVE Final   C Diff toxin NEGATIVE NEGATIVE Final   C Diff  interpretation Results are indeterminate. See PCR results.  Final  Clostridium Difficile by PCR     Status: Abnormal   Collection Time: 11/03/16 10:04 AM  Result Value Ref Range Status   Toxigenic C Difficile by pcr POSITIVE (A) NEGATIVE Final    Comment: Positive for toxigenic C. difficile with little to no toxin production. Only treat if clinical presentation suggests symptomatic illness.    Medical History: Past Medical History:  Diagnosis Date  . Leukemia ([place])     Medications:  Prescriptions Prior to Admission  Medication Sig Dispense Refill Last Dose  . acetaminophen (TYLENOL) 325 MG tablet Take 650 mg by mouth every 4 (four) hours as needed for pain.   PRN at  PRN  . amoxicillin-clavulanate (AUGMENTIN) 875-125 MG tablet Take 1 tablet by mouth 2 (two) times daily.   10/28/2016 at Unknown time  . HYDROcodone-acetaminophen (NORCO/VICODIN) 5-325 MG tablet Take 1-2 tablets by mouth at bedtime as needed for pain.   PRN at PRN  . ondansetron (ZOFRAN) 4 MG tablet Take 4 mg by mouth every 8 (eight) hours as needed for nausea.   PRN at PRN  . traMADol (ULTRAM) 50 MG tablet Take 50 mg by mouth every 6 (six) hours as needed for pain.   PRN at PRN   Scheduled:  . enoxaparin (LOVENOX) injection  40 mg Subcutaneous Q24H  . iopamidol  15 mL Oral Q1 Hr x 2  . liver oil-zinc oxide   Topical TID  . metoprolol tartrate  25 mg Oral BID  . tamsulosin  0.4 mg Oral Daily  . vancomycin  125 mg Oral Q6H   Assessment: Pharmacy consulted to dose and monitor Unasyn in this 76 year old male with intra-abdominal infection. Patient previously on vancomycin and zoysn. Patient has Cdiff PCR pending  Goal of Therapy:    Plan:  Will continue Unasyn 3 g IV q6 hours.   Duran Ohern D 11/05/2016,8:59 AM

## 2016-11-06 ENCOUNTER — Inpatient Hospital Stay: Payer: BLUE CROSS/BLUE SHIELD

## 2016-11-06 ENCOUNTER — Encounter
Admission: EM | Disposition: A | Discharge status: Home Health | Payer: Self-pay | Source: Home / Self Care | Attending: Internal Medicine

## 2016-11-06 ENCOUNTER — Inpatient Hospital Stay: Payer: BLUE CROSS/BLUE SHIELD | Admitting: Certified Registered Nurse Anesthetist

## 2016-11-06 ENCOUNTER — Encounter: Payer: Self-pay | Admitting: Certified Registered Nurse Anesthetist

## 2016-11-06 DIAGNOSIS — N201 Calculus of ureter: Secondary | ICD-10-CM | POA: Diagnosis not present

## 2016-11-06 HISTORY — PX: CYSTOSCOPY WITH URETEROSCOPY AND STENT PLACEMENT: SHX6377

## 2016-11-06 LAB — BASIC METABOLIC PANEL
Anion gap: 7 (ref 5–15)
BUN: 13 mg/dL (ref 6–20)
CHLORIDE: 104 mmol/L (ref 101–111)
CO2: 28 mmol/L (ref 22–32)
Calcium: 8.7 mg/dL — ABNORMAL LOW (ref 8.9–10.3)
Creatinine, Ser: 1.3 mg/dL — ABNORMAL HIGH (ref 0.61–1.24)
GFR, EST NON AFRICAN AMERICAN: 52 mL/min — AB (ref >60)
Glucose, Bld: 107 mg/dL — ABNORMAL HIGH (ref 65–99)
Potassium: 3.9 mmol/L (ref 3.5–5.1)
SODIUM: 139 mmol/L (ref 135–145)

## 2016-11-06 LAB — CBC
HCT: 39.4 % — ABNORMAL LOW (ref 40.0–52.0)
HEMOGLOBIN: 13.4 g/dL (ref 13.0–18.0)
MCH: 31.6 pg (ref 26.0–34.0)
MCHC: 34.1 g/dL (ref 32.0–36.0)
MCV: 92.8 fL (ref 80.0–100.0)
PLATELETS: 325 10*3/uL (ref 150–440)
RBC: 4.25 MIL/uL — ABNORMAL LOW (ref 4.40–5.90)
RDW: 13.1 % (ref 11.5–14.5)
WBC: 24.2 10*3/uL — ABNORMAL HIGH (ref 3.8–10.6)

## 2016-11-06 SURGERY — CYSTOURETEROSCOPY, WITH STENT INSERTION
Anesthesia: General | Laterality: Left | Wound class: Clean Contaminated

## 2016-11-06 MED ORDER — EPHEDRINE SULFATE 50 MG/ML IJ SOLN
INTRAMUSCULAR | Status: DC | PRN
  Administered 2016-11-06: 5 mg via INTRAVENOUS

## 2016-11-06 MED ORDER — PHENYLEPHRINE HCL 10 MG/ML IJ SOLN
INTRAMUSCULAR | Status: DC | PRN
  Administered 2016-11-06 (×2): 100 ug via INTRAVENOUS
  Administered 2016-11-06: 200 ug via INTRAVENOUS
  Administered 2016-11-06 (×3): 100 ug via INTRAVENOUS
  Administered 2016-11-06: 200 ug via INTRAVENOUS
  Administered 2016-11-06: 100 ug via INTRAVENOUS

## 2016-11-06 MED ORDER — HYDROMORPHONE HCL 1 MG/ML IJ SOLN
0.5000 mg | INTRAMUSCULAR | Status: DC | PRN
  Administered 2016-11-06 (×2): 0.5 mg via INTRAVENOUS

## 2016-11-06 MED ORDER — PROPOFOL 10 MG/ML IV BOLUS
INTRAVENOUS | Status: DC | PRN
  Administered 2016-11-06: 200 mg via INTRAVENOUS

## 2016-11-06 MED ORDER — FENTANYL CITRATE (PF) 100 MCG/2ML IJ SOLN
25.0000 ug | INTRAMUSCULAR | Status: DC | PRN
  Administered 2016-11-06 (×4): 25 ug via INTRAVENOUS

## 2016-11-06 MED ORDER — FENTANYL CITRATE (PF) 100 MCG/2ML IJ SOLN
INTRAMUSCULAR | Status: DC | PRN
  Administered 2016-11-06 (×2): 25 ug via INTRAVENOUS
  Administered 2016-11-06: 50 ug via INTRAVENOUS

## 2016-11-06 MED ORDER — SUCCINYLCHOLINE CHLORIDE 20 MG/ML IJ SOLN
INTRAMUSCULAR | Status: AC
  Filled 2016-11-06: qty 1

## 2016-11-06 MED ORDER — DEXAMETHASONE SODIUM PHOSPHATE 10 MG/ML IJ SOLN
INTRAMUSCULAR | Status: AC
  Filled 2016-11-06: qty 1

## 2016-11-06 MED ORDER — ONDANSETRON HCL 4 MG/2ML IJ SOLN
INTRAMUSCULAR | Status: AC
  Filled 2016-11-06: qty 2

## 2016-11-06 MED ORDER — LIDOCAINE HCL (PF) 2 % IJ SOLN
INTRAMUSCULAR | Status: AC
  Filled 2016-11-06: qty 2

## 2016-11-06 MED ORDER — DEXAMETHASONE SODIUM PHOSPHATE 10 MG/ML IJ SOLN
INTRAMUSCULAR | Status: DC | PRN
  Administered 2016-11-06: 5 mg via INTRAVENOUS

## 2016-11-06 MED ORDER — HYDROMORPHONE HCL 1 MG/ML IJ SOLN
INTRAMUSCULAR | Status: AC
  Administered 2016-11-06: 0.5 mg via INTRAVENOUS
  Filled 2016-11-06: qty 1

## 2016-11-06 MED ORDER — FENTANYL CITRATE (PF) 100 MCG/2ML IJ SOLN
INTRAMUSCULAR | Status: AC
  Filled 2016-11-06: qty 2

## 2016-11-06 MED ORDER — LIDOCAINE HCL (CARDIAC) 20 MG/ML IV SOLN
INTRAVENOUS | Status: DC | PRN
  Administered 2016-11-06: 80 mg via INTRAVENOUS

## 2016-11-06 MED ORDER — ACETAMINOPHEN 10 MG/ML IV SOLN
1000.0000 mg | Freq: Three times a day (TID) | INTRAVENOUS | Status: AC
  Administered 2016-11-06 (×2): 1000 mg via INTRAVENOUS
  Filled 2016-11-06 (×4): qty 100

## 2016-11-06 MED ORDER — FENTANYL CITRATE (PF) 100 MCG/2ML IJ SOLN
INTRAMUSCULAR | Status: AC
  Administered 2016-11-06: 25 ug via INTRAVENOUS
  Filled 2016-11-06: qty 2

## 2016-11-06 MED ORDER — ONDANSETRON HCL 4 MG/2ML IJ SOLN
INTRAMUSCULAR | Status: DC | PRN
  Administered 2016-11-06: 4 mg via INTRAVENOUS

## 2016-11-06 MED ORDER — ONDANSETRON HCL 4 MG/2ML IJ SOLN: 4.0000 mg | Freq: Once | INTRAMUSCULAR | Status: DC | PRN

## 2016-11-06 MED ORDER — IOTHALAMATE MEGLUMINE 43 % IV SOLN
INTRAVENOUS | Status: DC | PRN
  Administered 2016-11-06: 15 mL via URETHRAL

## 2016-11-06 MED ORDER — PROPOFOL 10 MG/ML IV BOLUS
INTRAVENOUS | Status: AC
  Filled 2016-11-06: qty 20

## 2016-11-06 SURGICAL SUPPLY — 24 items
BAG DRAIN CYSTO-URO LG1000N (MISCELLANEOUS) ×3 IMPLANT
BASKET ZERO TIP 1.9FR (BASKET) ×3 IMPLANT
CATH URETL 5X70 OPEN END (CATHETERS) ×3 IMPLANT
CONRAY 43 FOR UROLOGY 50M (MISCELLANEOUS) ×3 IMPLANT
FIBER LASER 365 (Laser) ×3 IMPLANT
GLOVE BIO SURGEON STRL SZ8 (GLOVE) ×9 IMPLANT
GOWN STRL REUS W/ TWL LRG LVL4 (GOWN DISPOSABLE) ×1 IMPLANT
GOWN STRL REUS W/ TWL XL LVL3 (GOWN DISPOSABLE) ×1 IMPLANT
GOWN STRL REUS W/TWL LRG LVL4 (GOWN DISPOSABLE) ×2
GOWN STRL REUS W/TWL XL LVL3 (GOWN DISPOSABLE) ×2
GUIDEWIRE STR ZIPWIRE 035X150 (MISCELLANEOUS) ×3 IMPLANT
KIT RM TURNOVER CYSTO AR (KITS) ×3 IMPLANT
NS IRRIG 500ML POUR BTL (IV SOLUTION) ×3 IMPLANT
PACK CYSTO AR (MISCELLANEOUS) ×3 IMPLANT
PREP PVP WINGED SPONGE (MISCELLANEOUS) ×3 IMPLANT
SENSORWIRE 0.038 NOT ANGLED (WIRE) ×3
SET CYSTO W/LG BORE CLAMP LF (SET/KITS/TRAYS/PACK) ×3 IMPLANT
SHEATH URETERAL 12FRX35CM (MISCELLANEOUS) ×3 IMPLANT
SOL .9 NS 3000ML IRR  AL (IV SOLUTION) ×4
SOL .9 NS 3000ML IRR UROMATIC (IV SOLUTION) ×2 IMPLANT
STENT URET 6FRX26 CONTOUR (STENTS) ×3 IMPLANT
SYRINGE IRR TOOMEY STRL 70CC (SYRINGE) ×3 IMPLANT
WATER STERILE IRR 1000ML POUR (IV SOLUTION) ×3 IMPLANT
WIRE SENSOR 0.038 NOT ANGLED (WIRE) ×1 IMPLANT

## 2016-11-06 NOTE — Progress Notes (Signed)
2 Days Post-Op  Subjective: Mr. Nielson continues to have left flank and LLQ pain and the CT yesterday showed no real change in the left distal stone with obstruction.     ROS:  Review of Systems  Constitutional: Negative for chills and fever.  Respiratory: Negative for shortness of breath.   Cardiovascular: Negative for chest pain.    Anti-infectives: Anti-infectives    Start     Dose/Rate Route Frequency Ordered Stop   11/03/16 1500  vancomycin (VANCOCIN) 50 mg/mL oral solution 125 mg     125 mg Oral Every 6 hours 11/03/16 1403     11/03/16 1430  metroNIDAZOLE (FLAGYL) tablet 500 mg  Status:  Discontinued     500 mg Oral Every 8 hours 11/03/16 1353 11/03/16 1403   11/03/16 1430  Ampicillin-Sulbactam (UNASYN) 3 g in sodium chloride 0.9 % 100 mL IVPB     3 g 200 mL/hr over 30 Minutes Intravenous Every 6 hours 11/03/16 1408     11/01/16 1400  piperacillin-tazobactam (ZOSYN) IVPB 4.5 g  Status:  Discontinued     4.5 g 200 mL/hr over 30 Minutes Intravenous Every 8 hours 11/01/16 1020 11/01/16 1135   11/01/16 1400  piperacillin-tazobactam (ZOSYN) 4.5 g in dextrose 5 % 100 mL IVPB  Status:  Discontinued     4.5 g 200 mL/hr over 30 Minutes Intravenous Every 8 hours 11/01/16 1135 11/03/16 1331   10/29/16 1700  vancomycin (VANCOCIN) 1,250 mg in sodium chloride 0.9 % 250 mL IVPB  Status:  Discontinued     1,250 mg 166.7 mL/hr over 90 Minutes Intravenous Every 18 hours 10/29/16 1552 10/29/16 1553   10/29/16 1600  piperacillin-tazobactam (ZOSYN) IVPB 3.375 g  Status:  Discontinued     3.375 g 12.5 mL/hr over 240 Minutes Intravenous Every 8 hours 10/29/16 1552 11/01/16 1020   10/29/16 1600  vancomycin (VANCOCIN) 1,250 mg in sodium chloride 0.9 % 250 mL IVPB  Status:  Discontinued     1,250 mg 166.7 mL/hr over 90 Minutes Intravenous Every 12 hours 10/29/16 1553 11/03/16 1331   10/29/16 1230  piperacillin-tazobactam (ZOSYN) IVPB 3.375 g     3.375 g 100 mL/hr over 30 Minutes Intravenous  Once  10/29/16 1215 10/29/16 1307   10/29/16 1230  vancomycin (VANCOCIN) IVPB 1000 mg/200 mL premix     1,000 mg 200 mL/hr over 60 Minutes Intravenous  Once 10/29/16 1215 10/29/16 1344      Current Facility-Administered Medications  Medication Dose Route Frequency Provider Last Rate Last Dose  . 0.9 %  sodium chloride infusion   Intravenous Continuous Loletha Grayer, MD 50 mL/hr at 11/03/16 1207    . acetaminophen (TYLENOL) tablet 650 mg  650 mg Oral Q6H PRN Gouru, Aruna, MD      . Ampicillin-Sulbactam (UNASYN) 3 g in sodium chloride 0.9 % 100 mL IVPB  3 g Intravenous Q6H Loletha Grayer, MD   Stopped at 11/06/16 0310  . enoxaparin (LOVENOX) injection 40 mg  40 mg Subcutaneous Q24H Hollice Espy, MD   40 mg at 11/04/16 2122  . HYDROcodone-acetaminophen (NORCO/VICODIN) 5-325 MG per tablet 1 tablet  1 tablet Oral Q4H PRN Loletha Grayer, MD   1 tablet at 11/04/16 2122  . metoprolol tartrate (LOPRESSOR) tablet 25 mg  25 mg Oral BID Isaias Cowman, MD   25 mg at 11/05/16 2157  . morphine 2 MG/ML injection 2 mg  2 mg Intravenous Q3H PRN Loletha Grayer, MD      . ondansetron [facility]) tablet 4 mg  4 mg Oral Q8H PRN Gouru, Aruna, MD      . sodium chloride flush (NS) 0.9 % injection 10-40 mL  10-40 mL Intracatheter Q12H Wieting, Richard, MD      . sodium chloride flush (NS) 0.9 % injection 10-40 mL  10-40 mL Intracatheter PRN Wieting, Richard, MD      . tamsulosin (FLOMAX) capsule 0.4 mg  0.4 mg Oral Daily Loletha Grayer, MD   0.4 mg at 11/05/16 1024  . traMADol (ULTRAM) tablet 50 mg  50 mg Oral Q6H PRN Gouru, Aruna, MD   50 mg at 11/06/16 0547  . vancomycin (VANCOCIN) 50 mg/mL oral solution 125 mg  125 mg Oral Q6H Loletha Grayer, MD   125 mg at 11/06/16 0240  . zinc oxide (BALMEX) 11.3 % cream   Topical TID Loletha Grayer, MD         Objective: Vital signs in last 24 hours: Temp:  [98.6 F (37 C)] 98.6 F (37 C) (06/15 2137) Pulse Rate:  [111] 111 (06/15 2137) Resp:  [18] 18  (06/15 2137) BP: (142)/(66) 142/66 (06/15 2137) SpO2:  [97 %] 97 % (06/15 2137)  Intake/Output from previous day: 06/15 0701 - 06/16 0700 In: 1600 [I.V.:1200; IV Piggyback:400] Out: -  Intake/Output this shift: No intake/output data recorded.   Physical Exam  Constitutional: He is oriented to person, place, and time.  Obese WM who appears uncomfortable but in NAD.  A/O x 3.   Cardiovascular: Normal rate and regular rhythm.   Pulmonary/Chest: Effort normal and breath sounds normal. No respiratory distress.  Abdominal: Soft. There is tenderness (left CVAT and LLQ tenderness.).  Musculoskeletal: Normal range of motion. He exhibits no edema or tenderness.  Neurological: He is alert and oriented to person, place, and time.  Skin: Skin is warm and dry.  Psychiatric: Mood and affect normal.  Vitals reviewed.   Lab Results:   Recent Labs  11/05/16 0454 11/06/16 0525  WBC 28.5* 24.2*  HGB 14.1 13.4  HCT 42.3 39.4*  PLT 328 325   BMET  Recent Labs  11/05/16 0454 11/06/16 0525  NA 139 139  K 3.8 3.9  CL 105 104  CO2 28 28  GLUCOSE 99 107*  BUN 14 13  CREATININE 1.15 1.30*  CALCIUM 8.8* 8.7*   PT/INR No results for input(s): LABPROT, INR in the last 72 hours. ABG No results for input(s): PHART, HCO3 in the last 72 hours.  Invalid input(s): PCO2, PO2  Studies/Results: Ct Abdomen Pelvis W Contrast  Result Date: 11/05/2016 CLINICAL DATA:  Diverticulitis. Renal stone. Improved left lower quadrant pain. EXAM: CT ABDOMEN AND PELVIS WITH CONTRAST TECHNIQUE: Multidetector CT imaging of the abdomen and pelvis was performed using the standard protocol following bolus administration of intravenous contrast. CONTRAST:  141mL ISOVUE-300 IOPAMIDOL (ISOVUE-300) INJECTION 61% COMPARISON:  10/31/2016 FINDINGS: Lower chest: Minimal dependent atelectasis in the lung bases. No pleural effusion. Coronary artery atherosclerosis. Hepatobiliary: No focal liver abnormality is seen. No  gallstones, gallbladder wall thickening, or biliary dilatation. Pancreas: Unremarkable. Spleen: Unremarkable. Adrenals/Urinary Tract: Unchanged 6.8 x 5.2 cm partially peripherally calcified cystic lesion arising from the right adrenal gland which may reflect the sequelae of remote hemorrhage or infection. Unchanged slight left adrenal gland thickening. Unchanged 10 cm interpolar right renal cyst and 1.7 cm left upper pole renal cyst. 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 Unchanged two 3 mm nonobstructing calculi in the upper pole/ interpolar left kidney. Unchanged 6 mm distal left ureteral calculus with persistent mild  left hydroureteronephrosis. No right renal calculi or hydronephrosis. Unremarkable bladder. Stomach/Bowel: The stomach is within normal limits. There is no evidence of bowel obstruction. Extensive left-sided colonic diverticulosis is again seen with persistent moderate sigmoid colon wall thickening and surrounding inflammatory stranding, similar to prior. The previously described gas and fluid collection associated with the posterior wall of the sigmoid colon is stable to minimally smaller than on the prior study, measuring 4.3 x 2.6 cm. There is now enteric contrast material within this collection indicating communication with the bowel lumen. No new fluid or gas collections are identified. The appendix is unremarkable. Vascular/Lymphatic: Abdominal aortic atherosclerosis without aneurysm. Unchanged 9 mm short axis left common iliac lymph noted. 12 mm short axis left external iliac lymph node is at most minimally larger than on the recent prior study. Subcentimeter short axis para-aortic lymph nodes are unchanged. Reproductive: Marked prostatic enlargement. Other: No intraperitoneal free fluid. 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 IMPRESSION: 1. Unchanged distal left ureteral calculus with mild hydroureteronephrosis. 2. Similar appearance of acute  sigmoid diverticulitis. Stable to minimally decreased size of pericolonic collection/contained perforation, now containing enteric contrast material. 3.  Aortic Atherosclerosis (ICD10-I70.0). Electronically Signed   By: Logan Bores M.D.   On: 11/05/2016 12:04   Dg Chest Port 1 View  Result Date: 11/05/2016 CLINICAL DATA:  Confirm central line placement.  History of leukemia EXAM: PORTABLE CHEST 1 VIEW COMPARISON:  Portable chest x-ray of October 29, 2016 FINDINGS: The lungs are well-expanded. The interstitial markings are coarse. The PICC line tip projects over the midportion of the SVC. There is no pneumothorax or pleural effusion. The heart and pulmonary vascularity are normal. IMPRESSION: Chronic bronchitic-smoking related changes, stable. There is no postprocedure complication following right-sided PICC line placement. There is no acute cardiopulmonary abnormality. Electronically Signed   By: David  Martinique M.D.   On: 11/05/2016 17:15     Assessment and Plan: Left distal ureteral stone with failure of fragmentation with ESWL and persistent pain.   I am going to take him for left ureteroscopy today.  I have reviewed the risks of bleeding, infection, ureteral injury, need for a stent or secondary procedures, thrombotic events and anesthetic complications.   He last had lovenox at 2100 on 6/14 and that will be held until postop.       LOS: 8 days    Malka So 11/06/2016 [phone] ID: Elvina Mattes, male   DOB: 1941/05/04, 76 y.o.   MRN: [id number]

## 2016-11-06 NOTE — OR Nursing (Signed)
Patient arrived in PACU pulled out stent dr. notified

## 2016-11-06 NOTE — Progress Notes (Signed)
PT Cancellation Note  Patient Details Name: William Kennedy MRN: [id number] DOB: 04-23-1941   Cancelled Treatment:    Reason Eval/Treat Not Completed: Patient at procedure or test/unavailable   Pt at procedure.  Unavailable for session.  Will review chart and continue as appropriate next session.   Chesley Noon 11/06/2016, 12:35 PM

## 2016-11-06 NOTE — Progress Notes (Signed)
Pt continues with bloody urine output. Unable to strain output due to patient incontinent at times and non-compliant with using urinal. No stones or clots noted in brief or chucks on bed. Pt continues to complain of pain in left shoulder and with urination. Buttocks and groin/scrotum severely excoriated, cleansed and ointment applied. Patient positioned on side, but unable to maintain comfortable position. Will continue to monitor.

## 2016-11-06 NOTE — Transfer of Care (Signed)
Immediate Anesthesia Transfer of Care Note  Patient: William Kennedy  Procedure(s) Performed: Procedure(s): CYSTOSCOPY WITH URETEROSCOPY AND STENT PLACEMENT retrograde pyelogram,holmium laser (Left)  Patient Location: PACU  Anesthesia Type:General  Level of Consciousness: awake and alert   Airway & Oxygen Therapy: Patient Spontanous Breathing and Patient connected to face mask oxygen  Post-op Assessment: Report given to RN and Post -op Vital signs reviewed and stable  Post vital signs: Reviewed and stable  Last Vitals:  Vitals:   11/05/16 2137 11/06/16 0905  BP: (!) 142/66 (!) 146/78  Pulse: (!) 111 90  Resp: 18 16  Temp: 37 C 37.1 C    Last Pain:  Vitals:   11/06/16 0905  TempSrc: Oral  PainSc: 6       Patients Stated Pain Goal: 2 (39/58/44 1712)  Complications: No apparent anesthesia complications

## 2016-11-06 NOTE — Anesthesia Procedure Notes (Signed)
Procedure Name: LMA Insertion Date/Time: 11/06/2016 10:36 AM Performed by: Johnna Acosta Pre-anesthesia Checklist: Patient identified, Emergency Drugs available, Suction available, Patient being monitored and Timeout performed Patient Re-evaluated:Patient Re-evaluated prior to inductionOxygen Delivery Method: Circle system utilized Preoxygenation: Pre-oxygenation with 100% oxygen Intubation Type: IV induction LMA: LMA inserted LMA Size: 5.0 Tube type: Oral Number of attempts: 1 Placement Confirmation: positive ETCO2 and breath sounds checked- equal and bilateral Tube secured with: Tape Dental Injury: Teeth and Oropharynx as per pre-operative assessment

## 2016-11-06 NOTE — Anesthesia Preprocedure Evaluation (Signed)
Anesthesia Evaluation  Patient identified by MRN, date of birth, ID band Patient awake    Reviewed: Allergy & Precautions, NPO status , Patient's Chart, lab work & pertinent test results  History of Anesthesia Complications Negative for: history of anesthetic complications  Airway Mallampati: III       Dental   Pulmonary neg pulmonary ROS, former smoker,           Cardiovascular hypertension, Pt. on medications and Pt. on home beta blockers + dysrhythmias Atrial Fibrillation      Neuro/Psych negative neurological ROS     GI/Hepatic negative GI ROS, Neg liver ROS,   Endo/Other  negative endocrine ROS  Renal/GU Renal disease (stones)     Musculoskeletal   Abdominal   Peds  Hematology negative hematology ROS (+)   Anesthesia Other Findings   Reproductive/Obstetrics                             Anesthesia Physical Anesthesia Plan  ASA: III and emergent  Anesthesia Plan: General   Post-op Pain Management:    Induction: Intravenous  PONV Risk Score and Plan: 2 and Ondansetron and Dexamethasone  Airway Management Planned: LMA  Additional Equipment:   Intra-op Plan:   Post-operative Plan:   Informed Consent: I have reviewed the patients History and Physical, chart, labs and discussed the procedure including the risks, benefits and alternatives for the proposed anesthesia with the patient or authorized representative who has indicated his/her understanding and acceptance.     Plan Discussed with:   Anesthesia Plan Comments:         Anesthesia Quick Evaluation

## 2016-11-06 NOTE — Anesthesia Post-op Follow-up Note (Cosign Needed)
Anesthesia QCDR form completed.        

## 2016-11-06 NOTE — Progress Notes (Signed)
Report given to OR nurse, Ivin Booty. IV abx and metoprolol given. Pt will be transferred to Pre-op/OR shortly.

## 2016-11-06 NOTE — OR Nursing (Signed)
Patient resting patient voided small amount of urine

## 2016-11-06 NOTE — Op Note (Signed)
William Kennedy, William Kennedy                ACCOUNT NO.:  [id number]  MEDICAL RECORD NO.:  [id number]  LOCATION:  138A                         FACILITY:  [facility]  PHYSICIAN:  Marshall Cork. Jeffie Pollock, M.D.    DATE OF BIRTH:  1940/06/08  DATE OF PROCEDURE:  11/06/2016 DATE OF DISCHARGE:                              OPERATIVE REPORT   The patient of Dr. Posey Pronto.  PROCEDURE PERFORMED: 1. Cystoscopy with left retrograde pyelogram and interpretation. 2. Left ureteroscopy with holmium laser lithotripsy, stone extraction,     and stent insertion.  SURGEON:  Marshall Cork. Jeffie Pollock, M.D.  PREOPERATIVE DIAGNOSIS:  Left distal ureteral stone.  POSTOPERATIVE DIAGNOSIS:  Left distal ureteral stone.  SURGEON:  Dr. Irine Seal.  ANESTHESIA:  General.  SPECIMEN:  Stone fragments.  DRAINS:  A 6-French x 26 cm left double-J stent.  BLOOD LOSS:  Minimal.  COMPLICATIONS:  None.  INDICATIONS:  William Kennedy is a 76 year old white male who has a 6 mm left distal ureteral stone that failed to respond the lithotripsies, continued to have flank pain and had persistent obstruction on CT.  He has elected to undergo ureteroscopy.  FINDINGS AND DESCRIPTION OF PROCEDURE:  He had been on antibiotics for diverticulitis.  He was taken to the operating room where general anesthetic was induced.  He was placed in the lithotomy position and fitted with PAS hose.  His perineum and genitalia were prepped with Betadine solution, was draped in usual sterile fashion.  Cystoscopy was performed using the 22-French scope and 30-degree lens. Examination revealed a normal urethra.  The external sphincter was intact.  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  Examination of bladder revealed moderate-to- severe trabeculation.  No tumors or stones were noted.  The bladder capacity was small.  The right ureteral orifice was unremarkable.  The left ureteral orifice was unremarkable.  A 5-French open-end  catheter was then passed through the scope and inserted in the left ureteral orifice.  Contrast was instilled.  This revealed fairly significant J hooking on the distal ureter with filling defect not well seen in the area of the stone on CT, but there was proximal hydronephrosis to the kidney.  After retrograde pyelography, a Glidewire was passed through the open- end catheter to the kidney and the open-end catheter was then advanced over the Glidewire to the lower proximal ureter.  The Glidewire was removed and a Sensor wire was then placed.  The open-end catheter was then removed and the cystoscope was removed. The inner core of a 12/14 digital access sheath was used to dilate the distal ureter to the level of the stone.  I then passed a 6.5 [place] semi rigid dual-lumen ureteroscope.  I had used the Glidewire through the scope, accessed the ureter, which was somewhat lateral and the patient's large prostate made access difficult, however, I was able to get in the ureter and visualize the stone.  The stone was then engaged with a 365 micron laser fiber set on 1 watt and initially 10 hertz but eventually 45 hertz.  The stone was broken into manageable fragments.  The fragments were then removed using a Zero-Tip Nitinol  basket.  Once the fragments had been removed, the ureteroscope was removed.  The cystoscope was reinserted over the wire and a 6-French 26 cm Contour double-J stent with string was passed over the wire to the kidney under fluoroscopic guidance.  The wire was removed leaving good coil in the kidney.  Initially, the distal coil was in the prostatic urethra, but I manipulated it up into the bladder.  The cystoscope was removed.  The stent string was left exiting urethra, and it was secured to the patient's penis.  He was taken down from lithotomy position.  His anesthetic was reversed.  He was moved to the recovery room in stable condition.  There were no  complications.   When he arrived in the PACU he became agitated and pulled his stent.  I didn't feel it needed to be replaced urgently but could be considered if he has persistent pain and obstruction.     Marshall Cork. Jeffie Pollock, M.D.     JJW/MEDQ  D:  11/06/2016  T:  11/06/2016  Job:  [id number]

## 2016-11-06 NOTE — Progress Notes (Signed)
Chief complaint at present is hurting and feeling like he needs to empty his bladder. He is in the recovery room after ureteroscopy with removal of particles of stone and insertion of  ureteral stent. He pulled out the stent in the recovery room  At present he reports no pain in the abdomen other than urgency to empty his bladder. He has been NPO for surgical procedure.  Pulse rate went as high as 140 last night more stable this morning. He is in some acute distress with urgency to empty his bladder. Abdomen is soft with minimal degree of left lower quadrant tenderness. His back dressing was changed. There is some persistent lesser degree of purulent discharge. The Penrose drain to remain intact. New cotton gauze dressing was applied.   Diagnosis gradual improvement with  subcutaneous abscess of the back Diverticulitis Left ureteral calculus Clostridium difficile colitis Chronic lymphocytic leukemia  Continue Unasyn per PICC line, oral vancomycin

## 2016-11-06 NOTE — Progress Notes (Signed)
Patient ID: William Kennedy, male   DOB: June 26, 1940, 76 y.o.   MRN: [id number]    [facility] PROGRESS NOTE  William Kennedy YYT:[id number] DOB: 1940/10/13 DOA: 10/29/2016 PCP: [name], MD  HPI/Subjective: Patient continued to complain of abdominal pain. He was taken to the OR today CYSTOSCOPY WITH URETEROSCOPY AND STENT PLACEMENT retrograde pyelogram,holmium laser (Left). Patient postprocedure pulled out his Foley catheter and he has bleeding. Urology has been notified.   Objective: Vitals:   11/06/16 1153 11/06/16 1227  BP: 101/85 (!) 157/77  Pulse: 89 83  Resp: 18 13  Temp: (!) 96.8 F (36 C)     Filed Weights   10/29/16 0957  Weight: 270 lb (122.5 kg)    ROS: Review of Systems  Constitutional: Negative for chills and fever.  Eyes: Negative for blurred vision.  Respiratory: Negative for cough and shortness of breath.   Cardiovascular: Negative for chest pain.  Gastrointestinal: Positive for abdominal pain and diarrhea. Negative for nausea and vomiting.  Genitourinary: Positive for flank pain. Negative for dysuria.  Musculoskeletal: Negative for joint pain.  Neurological: Negative for dizziness and headaches.   Exam: Physical Exam  Constitutional: He is oriented to person, place, and time.  HENT:  Nose: No mucosal edema.  Mouth/Throat: No oropharyngeal exudate or posterior oropharyngeal edema.  Eyes: Conjunctivae, EOM and lids are normal. Pupils are equal, round, and reactive to light.  Neck: No JVD present. Carotid bruit is not present. No edema present. No thyroid mass and no thyromegaly present.  Cardiovascular: Regular rhythm, S1 normal and S2 normal.  Exam reveals no gallop.   No murmur heard. Pulses:      Dorsalis pedis pulses are 2+ on the right side, and 2+ on the left side.  Respiratory: No respiratory distress. He has decreased breath sounds in the right lower field and the left lower field. He has no wheezes. He has no rhonchi. He has no  rales.  GI: Soft. Bowel sounds are normal. He exhibits distension. There is tenderness in the left lower quadrant.  Musculoskeletal:       Right ankle: He exhibits swelling.       Left ankle: He exhibits swelling.  Lymphadenopathy:    He has no cervical adenopathy.  Neurological: He is alert and oriented to person, place, and time. No cranial nerve deficit.  Skin: Skin is warm. Nails show no clubbing.  Left upper back covered with bandage  Psychiatric: He has a normal mood and affect.      Data Reviewed: Basic Metabolic Panel:  Recent Labs Lab 10/31/16 0424 11/01/16 0325 11/03/16 0429 11/05/16 0454 11/06/16 0525  NA 139 138 137 139 139  K 3.7 3.1* 3.9 3.8 3.9  CL 104 101 102 105 104  CO2 30 30 25 28 28   GLUCOSE 103* 111* 100* 99 107*  BUN 21* 15 9 14 13   CREATININE 1.26* 1.20 1.15 1.15 1.30*  CALCIUM 8.9 8.7* 9.0 8.8* 8.7*   Liver Function Tests: No results for input(s): AST, ALT, ALKPHOS, BILITOT, PROT, ALBUMIN in the last 168 hours. CBC:  Recent Labs Lab 10/31/16 0424 11/01/16 0325 11/03/16 0429 11/05/16 0454 11/06/16 0525  WBC 27.7* 25.8* 27.3* 28.5* 24.2*  HGB 14.2 14.5 14.8 14.1 13.4  HCT 42.9 42.8 44.5 42.3 39.4*  MCV 93.6 92.8 93.5 92.9 92.8  PLT 335 321 325 328 325   Cardiac Enzymes: No results for input(s): CKTOTAL, CKMB, CKMBINDEX, TROPONINI in the last 168 hours.   Recent Results (from  the past 240 hour(s))  Blood Culture (routine x 2)     Status: None   Collection Time: 10/29/16 12:41 PM  Result Value Ref Range Status   Specimen Description BLOOD RIGHT HAND  Final   Special Requests   Final    BOTTLES DRAWN AEROBIC AND ANAEROBIC Blood Culture adequate volume   Culture NO GROWTH 5 DAYS  Final   Report Status 11/03/2016 FINAL  Final  Blood Culture (routine x 2)     Status: None   Collection Time: 10/29/16 12:41 PM  Result Value Ref Range Status   Specimen Description BLOOD RIGHT HAND  Final   Special Requests   Final    BOTTLES DRAWN  AEROBIC AND ANAEROBIC Blood Culture adequate volume   Culture NO GROWTH 5 DAYS  Final   Report Status 11/03/2016 FINAL  Final  Aerobic/Anaerobic Culture (surgical/deep wound)     Status: None   Collection Time: 10/29/16  1:11 PM  Result Value Ref Range Status   Specimen Description BACK upper middle  Final   Special Requests NONE  Final   Gram Stain NO WBC SEEN RARE GRAM POSITIVE COCCI IN PAIRS   Final   Culture   Final    FEW STAPHYLOCOCCUS AUREUS NO ANAEROBES ISOLATED Performed at [facility], [place] [address], [place], [place] [address]    Report Status 11/03/2016 FINAL  Final   Organism ID, Bacteria STAPHYLOCOCCUS AUREUS  Final      Susceptibility   Staphylococcus aureus - MIC*    CIPROFLOXACIN <=0.5 SENSITIVE Sensitive     ERYTHROMYCIN <=0.25 SENSITIVE Sensitive     GENTAMICIN <=0.5 SENSITIVE Sensitive     OXACILLIN <=0.25 SENSITIVE Sensitive     TETRACYCLINE <=1 SENSITIVE Sensitive     VANCOMYCIN 1 SENSITIVE Sensitive     TRIMETH/SULFA <=10 SENSITIVE Sensitive     CLINDAMYCIN <=0.25 SENSITIVE Sensitive     RIFAMPIN <=0.5 SENSITIVE Sensitive     Inducible Clindamycin NEGATIVE Sensitive     * FEW STAPHYLOCOCCUS AUREUS  Anaerobic culture     Status: None   Collection Time: 10/30/16  8:05 AM  Result Value Ref Range Status   Specimen Description BACK MIDDLE UPPER BACK  Final   Special Requests NONE  Final   Culture   Final    NO ANAEROBES ISOLATED Performed at [facility], [address], [place], [place] [address]    Report Status 11/03/2016 FINAL  Final  Aerobic Culture (superficial specimen)     Status: None   Collection Time: 10/30/16  8:05 AM  Result Value Ref Range Status   Specimen Description BACK UPPER LEFT  Final   Special Requests ADDED 11/01/16  Final   Gram Stain   Final    FEW WBC PRESENT, PREDOMINANTLY PMN FEW GRAM POSITIVE COCCI IN PAIRS RARE GRAM POSITIVE RODS Performed at [facility], [place] [address], [place], [place] [address]     Culture FEW STAPHYLOCOCCUS AUREUS  Final   Report Status 11/04/2016 FINAL  Final   Organism ID, Bacteria STAPHYLOCOCCUS AUREUS  Final      Susceptibility   Staphylococcus aureus - MIC*    CIPROFLOXACIN <=0.5 SENSITIVE Sensitive     ERYTHROMYCIN <=0.25 SENSITIVE Sensitive     GENTAMICIN <=0.5 SENSITIVE Sensitive     OXACILLIN <=0.25 SENSITIVE Sensitive     TETRACYCLINE <=1 SENSITIVE Sensitive     VANCOMYCIN 1 SENSITIVE Sensitive     TRIMETH/SULFA <=10 SENSITIVE Sensitive     CLINDAMYCIN <=  0.25 SENSITIVE Sensitive     RIFAMPIN <=0.5 SENSITIVE Sensitive     Inducible Clindamycin NEGATIVE Sensitive     * FEW STAPHYLOCOCCUS AUREUS  Gastrointestinal Panel by PCR , Stool     Status: Abnormal   Collection Time: 11/03/16 10:04 AM  Result Value Ref Range Status   Campylobacter species NOT DETECTED NOT DETECTED Final   Plesimonas shigelloides NOT DETECTED NOT DETECTED Final   Salmonella species NOT DETECTED NOT DETECTED Final   Yersinia enterocolitica NOT DETECTED NOT DETECTED Final   Vibrio species NOT DETECTED NOT DETECTED Final   Vibrio cholerae NOT DETECTED NOT DETECTED Final   Enteroaggregative E coli (EAEC) NOT DETECTED NOT DETECTED Final   Enteropathogenic E coli (EPEC) DETECTED (A) NOT DETECTED Final    Comment: RESULT CALLED TO, READ BACK BY AND VERIFIED WITH: SUSANA PATINO 11/03/16 @ 1411  MLK    Enterotoxigenic E coli (ETEC) NOT DETECTED NOT DETECTED Final   Shiga like toxin producing E coli (STEC) NOT DETECTED NOT DETECTED Final   Shigella/Enteroinvasive E coli (EIEC) NOT DETECTED NOT DETECTED Final   Cryptosporidium NOT DETECTED NOT DETECTED Final   Cyclospora cayetanensis NOT DETECTED NOT DETECTED Final   Entamoeba histolytica NOT DETECTED NOT DETECTED Final   Giardia lamblia NOT DETECTED NOT DETECTED Final   Adenovirus F40/41 NOT DETECTED NOT DETECTED Final   Astrovirus NOT DETECTED NOT DETECTED Final   Norovirus GI/GII NOT DETECTED NOT DETECTED Final   Rotavirus A NOT  DETECTED NOT DETECTED Final   Sapovirus (I, II, IV, and V) NOT DETECTED NOT DETECTED Final  C difficile quick scan w PCR reflex     Status: Abnormal   Collection Time: 11/03/16 10:04 AM  Result Value Ref Range Status   C Diff antigen POSITIVE (A) NEGATIVE Final   C Diff toxin NEGATIVE NEGATIVE Final   C Diff interpretation Results are indeterminate. See PCR results.  Final  Clostridium Difficile by PCR     Status: Abnormal   Collection Time: 11/03/16 10:04 AM  Result Value Ref Range Status   Toxigenic C Difficile by pcr POSITIVE (A) NEGATIVE Final    Comment: Positive for toxigenic C. difficile with little to no toxin production. Only treat if clinical presentation suggests symptomatic illness.      Scheduled Meds: . enoxaparin (LOVENOX) injection  40 mg Subcutaneous Q24H  . metoprolol tartrate  25 mg Oral BID  . sodium chloride flush  10-40 mL Intracatheter Q12H  . tamsulosin  0.4 mg Oral Daily  . vancomycin  125 mg Oral Q6H  . zinc oxide   Topical TID   Continuous Infusions: . sodium chloride 50 mL/hr at 11/03/16 1207  . ampicillin-sulbactam (UNASYN) IV 3 g (11/06/16 0906)    Assessment/Plan:  1. Acute diverticulitis with gas formation. Repeat CAT scan she still shows diverticulitis but it does show contrast in the area which is likely a contain perforation. Case discussed with Dr. Ola Spurr infectious disease. PICC line Placed for IV Unasyn.. 2. Diarrhea. Stool positive for C. Difficile. Oral vancomycin  3. Obstructive nephrolithiasis on the left with slight hydronephrosis. Lithotripsy done Thursday. Stones still present on CT scan underwent cystoscopy with urethroscopy as above urology following 4. Clinical sepsis with left back abscess, leukocytosis and tachycardia. continue unasyn. IV Unasyn will cover the sensitive staph aureus. 5. Acute rhabdomyolysis. Improved. 6. Wide complex tachycardia nonsustained. Cardiology thinks this could be paroxysmal atrial fibrillation with  aberrancy. Patient on metoprolol. Aspirin when no further procedures planned 7. Hypomagnesemia. Replaced 8. Hypokalemia  replaced 9. History of CLL with chronically elevated white count 10. Erythema on the buttock. Desitin cream started.  Code Status:     Code Status Orders        Start     Ordered   10/29/16 1526  Full code  Continuous     10/29/16 1525    Code Status History    Date Active Date Inactive Code Status Order ID Comments User Context   This patient has a current code status but no historical code status.     Family Communication: Wife at the bedside Disposition Plan: Potentially out to rehabilitation over the weekend  Antibiotics:  Unasyn  By mouth vancomycin  Time spent: 25 minutes. Case discussed with  Dr. Virgel Manifold, [facility]

## 2016-11-06 NOTE — Anesthesia Postprocedure Evaluation (Signed)
Anesthesia Post Note  Patient: William Kennedy  Procedure(s) Performed: Procedure(s) (LRB): CYSTOSCOPY WITH URETEROSCOPY AND STENT PLACEMENT retrograde pyelogram,holmium laser (Left)  Patient location during evaluation: PACU Anesthesia Type: General Level of consciousness: awake and alert Pain management: pain level controlled Vital Signs Assessment: post-procedure vital signs reviewed and stable Respiratory status: spontaneous breathing and respiratory function stable Cardiovascular status: stable Anesthetic complications: no     Last Vitals:  Vitals:   11/06/16 1153 11/06/16 1227  BP: 101/85 (!) 157/77  Pulse: 89 83  Resp: 18 13  Temp: (!) 36 C     Last Pain:  Vitals:   11/06/16 1227  TempSrc:   PainSc: 8                  Ellinore Merced K

## 2016-11-06 NOTE — Brief Op Note (Signed)
10/29/2016 - 11/06/2016  11:36 AM  PATIENT:  William Kennedy  76 y.o. male  PRE-OPERATIVE DIAGNOSIS:  left  distal ureteral stone post ESWL  POST-OPERATIVE DIAGNOSIS:  same  PROCEDURE:  Procedure(s): CYSTOSCOPY WITH URETEROSCOPY AND STENT PLACEMENT retrograde pyelogram,holmium laser (Left)  SURGEON:  Surgeon(s) and Role:    Irine Seal, MD - Primary  PHYSICIAN ASSISTANT:   ASSISTANTS: none   ANESTHESIA:   general  EBL:  Total I/O In: 500 [I.V.:500] Out: 0   BLOOD ADMINISTERED:none  DRAINS: left 6 x 26 JJ stent   LOCAL MEDICATIONS USED:  NONE  SPECIMEN:  Source of Specimen:  stone fragments  DISPOSITION OF SPECIMEN:  PATHOLOGY  COUNTS:  YES  TOURNIQUET:  * No tourniquets in log *  DICTATION: .Other Dictation: Dictation Number [id number]  PLAN OF CARE: Admit to inpatient   PATIENT DISPOSITION:  PACU - hemodynamically stable.   Delay start of Pharmacological VTE agent (>24hrs) due to surgical blood loss or risk of bleeding: not applicable

## 2016-11-07 ENCOUNTER — Encounter: Payer: Self-pay | Admitting: Urology

## 2016-11-07 LAB — IMMUNOGLOBULINS A/E/G/M, SERUM
IGA: 170 mg/dL (ref 61–437)
IgE (Immunoglobulin E), Serum: 23 IU/mL (ref 0–100)
IgG (Immunoglobin G), Serum: 536 mg/dL — ABNORMAL LOW (ref 700–1600)
IgM, Serum: 50 mg/dL (ref 15–143)

## 2016-11-07 MED ORDER — ASPIRIN EC 325 MG PO TBEC
325.0000 mg | DELAYED_RELEASE_TABLET | Freq: Every day | ORAL | Status: DC
  Administered 2016-11-07 – 2016-11-09 (×3): 325 mg via ORAL
  Filled 2016-11-07 (×3): qty 1

## 2016-11-07 NOTE — Progress Notes (Signed)
Chief complaint today is minimal discomfort. He says he feels much better now. He is voiding satisfactorily and urine appears to be more clear today. His diarrhea is much decreased. He is tolerating his diet well. He reports no nausea. He gave himself about this morning. He also walking across the room.  Vital signs noted was hypertensive last night.  On exam he is awake alert and oriented and in no acute distress. The dressing on his back was changed and does still have some purulent discharge. Penrose drains remain intact. Another gauze dressing was applied. Abdomen is obese rotund and soft and today is nontender.  Impression #1 improvement with respect to diverticulitis #2 status post ureteroscopy with removal of ureteral stone and improved #3 back She has continues to drain with Penrose drain intact. #4 chronic lymphocytic leukemia #5 Clostridium difficile colitis improved  Seeing that he is starting to walk he may possibly be able to go from the hospital to home at the time of discharge. May be able to go home on intravenous Unasyn or otherwise could consider Rocephin for 24 hour dosing. Consider approximate 10 days of intravenous antibiotics following the CT scan that was done on 11/05/2016. Consider a follow-up CT scanning to reevaluate diverticulitis with localized perforation prior to discontinuing the IV antibiotics. Anticipate continuing oral vancomycin for approximately 10 days following the discontinuation of intravenous antibiotic  Anticipate coming into my office in approximately 2 weeks to have the Penrose drain removed.  I will be away the next 7 days and will be covered by on call physicians  Today's visit was greater than 25 minutes

## 2016-11-07 NOTE — Progress Notes (Signed)
1 Day Post-Op  Subjective: William Kennedy is POD1 from left ureteroscopy with laser and stent.  I was able to fragment and retrieve the stones.   He had a stent placed with a tether but pulled that out in the PACU.   He is doing well with minimal pain and a clearing urine.   He has no other complaints.  ROS:  Review of Systems  Constitutional: Negative for fever.  Gastrointestinal: Negative for nausea.    Anti-infectives: Anti-infectives    Start     Dose/Rate Route Frequency Ordered Stop   11/03/16 1500  vancomycin (VANCOCIN) 50 mg/mL oral solution 125 mg     125 mg Oral Every 6 hours 11/03/16 1403     11/03/16 1430  metroNIDAZOLE (FLAGYL) tablet 500 mg  Status:  Discontinued     500 mg Oral Every 8 hours 11/03/16 1353 11/03/16 1403   11/03/16 1430  Ampicillin-Sulbactam (UNASYN) 3 g in sodium chloride 0.9 % 100 mL IVPB     3 g 200 mL/hr over 30 Minutes Intravenous Every 6 hours 11/03/16 1408     11/01/16 1400  piperacillin-tazobactam (ZOSYN) IVPB 4.5 g  Status:  Discontinued     4.5 g 200 mL/hr over 30 Minutes Intravenous Every 8 hours 11/01/16 1020 11/01/16 1135   11/01/16 1400  piperacillin-tazobactam (ZOSYN) 4.5 g in dextrose 5 % 100 mL IVPB  Status:  Discontinued     4.5 g 200 mL/hr over 30 Minutes Intravenous Every 8 hours 11/01/16 1135 11/03/16 1331   10/29/16 1700  vancomycin (VANCOCIN) 1,250 mg in sodium chloride 0.9 % 250 mL IVPB  Status:  Discontinued     1,250 mg 166.7 mL/hr over 90 Minutes Intravenous Every 18 hours 10/29/16 1552 10/29/16 1553   10/29/16 1600  piperacillin-tazobactam (ZOSYN) IVPB 3.375 g  Status:  Discontinued     3.375 g 12.5 mL/hr over 240 Minutes Intravenous Every 8 hours 10/29/16 1552 11/01/16 1020   10/29/16 1600  vancomycin (VANCOCIN) 1,250 mg in sodium chloride 0.9 % 250 mL IVPB  Status:  Discontinued     1,250 mg 166.7 mL/hr over 90 Minutes Intravenous Every 12 hours 10/29/16 1553 11/03/16 1331   10/29/16 1230  piperacillin-tazobactam (ZOSYN) IVPB  3.375 g     3.375 g 100 mL/hr over 30 Minutes Intravenous  Once 10/29/16 1215 10/29/16 1307   10/29/16 1230  vancomycin (VANCOCIN) IVPB 1000 mg/200 mL premix     1,000 mg 200 mL/hr over 60 Minutes Intravenous  Once 10/29/16 1215 10/29/16 1344      Current Facility-Administered Medications  Medication Dose Route Frequency Provider Last Rate Last Dose  . 0.9 %  sodium chloride infusion   Intravenous Continuous Loletha Grayer, MD 50 mL/hr at 11/06/16 1344    . acetaminophen (OFIRMEV) IV 1,000 mg  1,000 mg Intravenous Gretta Cool, MD   Stopped at 11/06/16 2210  . acetaminophen (TYLENOL) tablet 650 mg  650 mg Oral Q6H PRN Gouru, Aruna, MD      . Ampicillin-Sulbactam (UNASYN) 3 g in sodium chloride 0.9 % 100 mL IVPB  3 g Intravenous Q6H Loletha Grayer, MD   Stopped at 11/07/16 0430  . enoxaparin (LOVENOX) injection 40 mg  40 mg Subcutaneous Q24H Hollice Espy, MD   40 mg at 11/06/16 2157  . HYDROcodone-acetaminophen (NORCO/VICODIN) 5-325 MG per tablet 1 tablet  1 tablet Oral Q4H PRN Loletha Grayer, MD   1 tablet at 11/06/16 1732  . metoprolol tartrate (LOPRESSOR) tablet 25 mg  25 mg  Oral BID Isaias Cowman, MD   25 mg at 11/06/16 2152  . morphine 2 MG/ML injection 2 mg  2 mg Intravenous Q3H PRN Loletha Grayer, MD      . ondansetron (ZOFRAN) tablet 4 mg  4 mg Oral Q8H PRN Gouru, Aruna, MD      . sodium chloride flush (NS) 0.9 % injection 10-40 mL  10-40 mL Intracatheter Q12H Loletha Grayer, MD   10 mL at 11/06/16 2157  . sodium chloride flush (NS) 0.9 % injection 10-40 mL  10-40 mL Intracatheter PRN Wieting, Richard, MD      . tamsulosin [facility]) capsule 0.4 mg  0.4 mg Oral Daily Loletha Grayer, MD   0.4 mg at 11/06/16 1317  . traMADol (ULTRAM) tablet 50 mg  50 mg Oral Q6H PRN Gouru, Aruna, MD   50 mg at 11/06/16 1914  . vancomycin (VANCOCIN) 50 mg/mL oral solution 125 mg  125 mg Oral Q6H Wieting, Richard, MD   125 mg at 11/07/16 0300  . zinc oxide (BALMEX) 11.3 % cream    Topical TID Loletha Grayer, MD         Objective: Vital signs in last 24 hours: Temp:  [96.2 F (35.7 C)-99 F (37.2 C)] 99 F (37.2 C) (06/16 2349) Pulse Rate:  [72-90] 72 (06/16 2349) Resp:  [13-18] 18 (06/16 2349) BP: (101-191)/(57-105) 191/105 (06/16 2349) SpO2:  [91 %-100 %] 100 % (06/16 2349)  Intake/Output from previous day: 06/16 0701 - 06/17 0700 In: 1450 [I.V.:1150; IV Piggyback:300] Out: 350 [Urine:350] Intake/Output this shift: No intake/output data recorded.   Physical Exam  Constitutional: He is well-developed, well-nourished, and in no distress.  Cardiovascular: Normal rate and regular rhythm.   Pulmonary/Chest: Effort normal. No respiratory distress.  Abdominal: Soft. There is tenderness (He has some LLQ tenderness and mild LCVAT).  Vitals reviewed.   Lab Results:   Recent Labs  11/05/16 0454 11/06/16 0525  WBC 28.5* 24.2*  HGB 14.1 13.4  HCT 42.3 39.4*  PLT 328 325   BMET  Recent Labs  11/05/16 0454 11/06/16 0525  NA 139 139  K 3.8 3.9  CL 105 104  CO2 28 28  GLUCOSE 99 107*  BUN 14 13  CREATININE 1.15 1.30*  CALCIUM 8.8* 8.7*   PT/INR No results for input(s): LABPROT, INR in the last 72 hours. ABG No results for input(s): PHART, HCO3 in the last 72 hours.  Invalid input(s): PCO2, PO2  Studies/Results: Ct Abdomen Pelvis W Contrast  Result Date: 11/05/2016 CLINICAL DATA:  Diverticulitis. Renal stone. Improved left lower quadrant pain. EXAM: CT ABDOMEN AND PELVIS WITH CONTRAST TECHNIQUE: Multidetector CT imaging of the abdomen and pelvis was performed using the standard protocol following bolus administration of intravenous contrast. CONTRAST:  142mL ISOVUE-300 IOPAMIDOL (ISOVUE-300) INJECTION 61% COMPARISON:  10/31/2016 FINDINGS: Lower chest: Minimal dependent atelectasis in the lung bases. No pleural effusion. Coronary artery atherosclerosis. Hepatobiliary: No focal liver abnormality is seen. No gallstones, gallbladder wall  thickening, or biliary dilatation. Pancreas: Unremarkable. Spleen: Unremarkable. Adrenals/Urinary Tract: Unchanged 6.8 x 5.2 cm partially peripherally calcified cystic lesion arising from the right adrenal gland which may reflect the sequelae of remote hemorrhage or infection. Unchanged slight left adrenal gland thickening. Unchanged 10 cm interpolar right renal cyst and 1.7 cm left upper pole renal cyst. 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 Unchanged two 3 mm nonobstructing calculi in the upper pole/ interpolar left kidney. Unchanged 6 mm distal left ureteral calculus with persistent mild left hydroureteronephrosis. No right  renal calculi or hydronephrosis. Unremarkable bladder. Stomach/Bowel: The stomach is within normal limits. There is no evidence of bowel obstruction. Extensive left-sided colonic diverticulosis is again seen with persistent moderate sigmoid colon wall thickening and surrounding inflammatory stranding, similar to prior. The previously described gas and fluid collection associated with the posterior wall of the sigmoid colon is stable to minimally smaller than on the prior study, measuring 4.3 x 2.6 cm. There is now enteric contrast material within this collection indicating communication with the bowel lumen. No new fluid or gas collections are identified. The appendix is unremarkable. Vascular/Lymphatic: Abdominal aortic atherosclerosis without aneurysm. Unchanged 9 mm short axis left common iliac lymph noted. 12 mm short axis left external iliac lymph node is at most minimally larger than on the recent prior study. Subcentimeter short axis para-aortic lymph nodes are unchanged. Reproductive: Marked prostatic enlargement. Other: No intraperitoneal free fluid. 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 IMPRESSION: 1. Unchanged distal left ureteral calculus with mild hydroureteronephrosis. 2. Similar appearance of acute sigmoid diverticulitis. Stable  to minimally decreased size of pericolonic collection/contained perforation, now containing enteric contrast material. 3.  Aortic Atherosclerosis (ICD10-I70.0). Electronically Signed   By: Logan Bores M.D.   On: 11/05/2016 12:04   Dg Chest Port 1 View  Result Date: 11/05/2016 CLINICAL DATA:  Confirm central line placement.  History of leukemia EXAM: PORTABLE CHEST 1 VIEW COMPARISON:  Portable chest x-ray of October 29, 2016 FINDINGS: The lungs are well-expanded. The interstitial markings are coarse. The PICC line tip projects over the midportion of the SVC. There is no pneumothorax or pleural effusion. The heart and pulmonary vascularity are normal. IMPRESSION: Chronic bronchitic-smoking related changes, stable. There is no postprocedure complication following right-sided PICC line placement. There is no acute cardiopulmonary abnormality. Electronically Signed   By: David  Martinique M.D.   On: 11/05/2016 17:15   Dg C-arm 1-60 Min-no Report  Result Date: 11/06/2016 Fluoroscopy was utilized by the requesting physician.  No radiographic interpretation.     Assessment and Plan: Left distal ureteral stone.    He is doing well post op with minimal pain or hematuria.   He pulled his stent in the PACU but is doing well without it.    He will need f/u at [facility] in a few weeks for a renal US.   Urology will sign off.       LOS: 9 days    Malka So 11/07/2016 [phone] ID: William Kennedy, male   DOB: [birth year], 75 y.o.   MRN: [id number]

## 2016-11-07 NOTE — Progress Notes (Signed)
Physical Therapy Treatment Patient Details Name: William Kennedy MRN: [id number] DOB: Jul 28, 1940 Today's Date: 11/07/2016    History of Present Illness 76 yo male with onset of sepsis from abscess to back creating cellulitis, was drained by surgeon two weeks ago but now greater infection.  Pt has leukocytosis, acute kidney injury, tachycardia with MD clearing him for serious arrythmia. Pt is now s/p cystoscopy and stent placement.    PT Comments    Pt is making good progress and is safe to dc home at this point due to progress with strength/mobility. Observed pt ambulating in room safely, without LOB noted. Pt uses RW for gait with reciprocal gait pattern noted. Will upgrade disposition to [place] at this time. Attempted to call RN, no answer. Continue to progress.   Follow Up Recommendations  Home health PT     Equipment Recommendations  None recommended by PT    Recommendations for Other Services       Precautions / Restrictions Precautions Precautions: Fall Restrictions Weight Bearing Restrictions: No    Mobility  Bed Mobility Overal bed mobility: Modified Independent Bed Mobility: Supine to Sit     Supine to sit: Modified independent (Device/Increase time) Sit to supine: Modified independent (Device/Increase time)   General bed mobility comments: uses trapeze bar for mobility and is able to sit at EOB with safe technique. Pt demonstrates ease of transfer.  Transfers Overall transfer level: Modified independent Equipment used: Rolling walker (2 wheeled) Transfers: Sit to/from Stand Sit to Stand: Supervision         General transfer comment: safe technique with RW used. Upright posture noted. Slight unsteadiness noted, however improves quickly with a few seconds.  Ambulation/Gait Ambulation/Gait assistance: Supervision Ambulation Distance (Feet): 45 Feet Assistive device: Rolling walker (2 wheeled) Gait Pattern/deviations: Step-through pattern     General Gait  Details: limited to in room ambulation secondary to isolation. RW used for mobility. Pt able to perform ambulation including completing turns and avoiding obstacles. Safe technique performed without LOB present.   Stairs            Wheelchair Mobility    Modified Rankin (Stroke Patients Only)       Balance                                            Cognition Arousal/Alertness: Awake/alert Behavior During Therapy: WFL for tasks assessed/performed Overall Cognitive Status: Within Functional Limits for tasks assessed                                        Exercises Other Exercises Other Exercises: deferred at this time    General Comments        Pertinent Vitals/Pain Pain Assessment: No/denies pain    Home Living                      Prior Function            PT Goals (current goals can now be found in the care plan section) Acute Rehab PT Goals Patient Stated Goal: to get home and progress with therapy PT Goal Formulation: With patient Time For Goal Achievement: 11/13/16 Potential to Achieve Goals: Good Progress towards PT goals: Progressing toward goals    Frequency    Min  2X/week      PT Plan Discharge plan needs to be updated    Co-evaluation              AM-PAC PT "6 Clicks" Daily Activity  Outcome Measure  Difficulty turning over in bed (including adjusting bedclothes, sheets and blankets)?: None Difficulty moving from lying on back to sitting on the side of the bed? : None Difficulty sitting down on and standing up from a chair with arms (e.g., wheelchair, bedside commode, etc,.)?: None Help needed moving to and from a bed to chair (including a wheelchair)?: None Help needed walking in hospital room?: None Help needed climbing 3-5 steps with a railing? : A Little 6 Click Score: 23    End of Session Equipment Utilized During Treatment: Gait belt Activity Tolerance: Patient tolerated  treatment well Patient left: in bed;with family/visitor present;with call bell/phone within reach (no alarm on-pt in room indep) Nurse Communication: Mobility status PT Visit Diagnosis: Muscle weakness (generalized) (M62.81);History of falling (Z91.81)     Time: 8177-1165 PT Time Calculation (min) (ACUTE ONLY): 10 min  Charges:  $Gait Training: 8-22 mins                    G Codes:       Greggory Stallion, PT, DPT [phone]    Esmerelda Finnigan 11/07/2016, 2:57 PM

## 2016-11-07 NOTE — Progress Notes (Signed)
Patient ID: William Kennedy, male   DOB: [birth year], 76 y.o.   MRN: [id number]    [facility] PROGRESS NOTE  FIRAS GUARDADO FMB:[id number] DOB: 1940/09/19 DOA: 10/29/2016 PCP: [name], MD  HPI/Subjective: Currently eating doing much better abdominal pain much improved patient states that he was able to take a bath on his own today   Objective: Vitals:   11/06/16 2349 11/07/16 1101  BP: (!) 191/105 138/78  Pulse: 72 68  Resp: 18   Temp: 99 F (37.2 C)     Filed Weights   10/29/16 0957  Weight: 270 lb (122.5 kg)    ROS: Review of Systems  Constitutional: Negative for chills and fever.  Eyes: Negative for blurred vision.  Respiratory: Negative for cough and shortness of breath.   Cardiovascular: Negative for chest pain.  Gastrointestinal: Negative for abdominal pain, diarrhea, nausea and vomiting.  Genitourinary: Negative for dysuria and flank pain.  Musculoskeletal: Negative for joint pain.  Neurological: Negative for dizziness and headaches.   Exam: Physical Exam  Constitutional: He is oriented to person, place, and time.  HENT:  Nose: No mucosal edema.  Mouth/Throat: No oropharyngeal exudate or posterior oropharyngeal edema.  Eyes: Conjunctivae, EOM and lids are normal. Pupils are equal, round, and reactive to light.  Neck: No JVD present. Carotid bruit is not present. No edema present. No thyroid mass and no thyromegaly present.  Cardiovascular: Regular rhythm, S1 normal and S2 normal.  Exam reveals no gallop.   No murmur heard. Pulses:      Dorsalis pedis pulses are 2+ on the right side, and 2+ on the left side.  Respiratory: No respiratory distress. He has decreased breath sounds in the right lower field and the left lower field. He has no wheezes. He has no rhonchi. He has no rales.  GI: Soft. Bowel sounds are normal.  Musculoskeletal:       Right ankle: He exhibits swelling.       Left ankle: He exhibits swelling.  Lymphadenopathy:    He has  no cervical adenopathy.  Neurological: He is alert and oriented to person, place, and time. No cranial nerve deficit.  Skin: Skin is warm. Nails show no clubbing.  Left upper back covered with bandage  Psychiatric: He has a normal mood and affect.      Data Reviewed: Basic Metabolic Panel:  Recent Labs Lab 11/01/16 0325 11/03/16 0429 11/05/16 0454 11/06/16 0525  NA 138 137 139 139  K 3.1* 3.9 3.8 3.9  CL 101 102 105 104  CO2 30 25 28 28   GLUCOSE 111* 100* 99 107*  BUN 15 9 14 13   CREATININE 1.20 1.15 1.15 1.30*  CALCIUM 8.7* 9.0 8.8* 8.7*   Liver Function Tests: No results for input(s): AST, ALT, ALKPHOS, BILITOT, PROT, ALBUMIN in the last 168 hours. CBC:  Recent Labs Lab 11/01/16 0325 11/03/16 0429 11/05/16 0454 11/06/16 0525  WBC 25.8* 27.3* 28.5* 24.2*  HGB 14.5 14.8 14.1 13.4  HCT 42.8 44.5 42.3 39.4*  MCV 92.8 93.5 92.9 92.8  PLT 321 325 328 325   Cardiac Enzymes: No results for input(s): CKTOTAL, CKMB, CKMBINDEX, TROPONINI in the last 168 hours.   Recent Results (from the past 240 hour(s))  Blood Culture (routine x 2)     Status: None   Collection Time: 10/29/16 12:41 PM  Result Value Ref Range Status   Specimen Description BLOOD RIGHT HAND  Final   Special Requests   Final    BOTTLES  DRAWN AEROBIC AND ANAEROBIC Blood Culture adequate volume   Culture NO GROWTH 5 DAYS  Final   Report Status 11/03/2016 FINAL  Final  Blood Culture (routine x 2)     Status: None   Collection Time: 10/29/16 12:41 PM  Result Value Ref Range Status   Specimen Description BLOOD RIGHT HAND  Final   Special Requests   Final    BOTTLES DRAWN AEROBIC AND ANAEROBIC Blood Culture adequate volume   Culture NO GROWTH 5 DAYS  Final   Report Status 11/03/2016 FINAL  Final  Aerobic/Anaerobic Culture (surgical/deep wound)     Status: None   Collection Time: 10/29/16  1:11 PM  Result Value Ref Range Status   Specimen Description BACK upper middle  Final   Special Requests NONE   Final   Gram Stain NO WBC SEEN RARE GRAM POSITIVE COCCI IN PAIRS   Final   Culture   Final    FEW STAPHYLOCOCCUS AUREUS NO ANAEROBES ISOLATED Performed at [facility], [place] [address], [place], [place] [address]    Report Status 11/03/2016 FINAL  Final   Organism ID, Bacteria STAPHYLOCOCCUS AUREUS  Final      Susceptibility   Staphylococcus aureus - MIC*    CIPROFLOXACIN <=0.5 SENSITIVE Sensitive     ERYTHROMYCIN <=0.25 SENSITIVE Sensitive     GENTAMICIN <=0.5 SENSITIVE Sensitive     OXACILLIN <=0.25 SENSITIVE Sensitive     TETRACYCLINE <=1 SENSITIVE Sensitive     VANCOMYCIN 1 SENSITIVE Sensitive     TRIMETH/SULFA <=10 SENSITIVE Sensitive     CLINDAMYCIN <=0.25 SENSITIVE Sensitive     RIFAMPIN <=0.5 SENSITIVE Sensitive     Inducible Clindamycin NEGATIVE Sensitive     * FEW STAPHYLOCOCCUS AUREUS  Anaerobic culture     Status: None   Collection Time: 10/30/16  8:05 AM  Result Value Ref Range Status   Specimen Description BACK MIDDLE UPPER BACK  Final   Special Requests NONE  Final   Culture   Final    NO ANAEROBES ISOLATED Performed at [facility], [address], [place], [place] [address]    Report Status 11/03/2016 FINAL  Final  Aerobic Culture (superficial specimen)     Status: None   Collection Time: 10/30/16  8:05 AM  Result Value Ref Range Status   Specimen Description BACK UPPER LEFT  Final   Special Requests ADDED 11/01/16  Final   Gram Stain   Final    FEW WBC PRESENT, PREDOMINANTLY PMN FEW GRAM POSITIVE COCCI IN PAIRS RARE GRAM POSITIVE RODS Performed at [facility], [place] [address] [place], [place] [address]    Culture FEW STAPHYLOCOCCUS AUREUS  Final   Report Status 11/04/2016 FINAL  Final   Organism ID, Bacteria STAPHYLOCOCCUS AUREUS  Final      Susceptibility   Staphylococcus aureus - MIC*    CIPROFLOXACIN <=0.5 SENSITIVE Sensitive     ERYTHROMYCIN <=0.25 SENSITIVE Sensitive     GENTAMICIN <=0.5 SENSITIVE Sensitive     OXACILLIN  <=0.25 SENSITIVE Sensitive     TETRACYCLINE <=1 SENSITIVE Sensitive     VANCOMYCIN 1 SENSITIVE Sensitive     TRIMETH/SULFA <=10 SENSITIVE Sensitive     CLINDAMYCIN <=0.25 SENSITIVE Sensitive     RIFAMPIN <=0.5 SENSITIVE Sensitive     Inducible Clindamycin NEGATIVE Sensitive     * FEW STAPHYLOCOCCUS AUREUS  Gastrointestinal Panel by PCR , Stool     Status: Abnormal   Collection Time: 11/03/16 10:04 AM  Result  Value Ref Range Status   Campylobacter species NOT DETECTED NOT DETECTED Final   Plesimonas shigelloides NOT DETECTED NOT DETECTED Final   Salmonella species NOT DETECTED NOT DETECTED Final   Yersinia enterocolitica NOT DETECTED NOT DETECTED Final   Vibrio species NOT DETECTED NOT DETECTED Final   Vibrio cholerae NOT DETECTED NOT DETECTED Final   Enteroaggregative E coli (EAEC) NOT DETECTED NOT DETECTED Final   Enteropathogenic E coli (EPEC) DETECTED (A) NOT DETECTED Final    Comment: RESULT CALLED TO, READ BACK BY AND VERIFIED WITH: SUSANA PATINO 11/03/16 @ 1411  MLK    Enterotoxigenic E coli (ETEC) NOT DETECTED NOT DETECTED Final   Shiga like toxin producing E coli (STEC) NOT DETECTED NOT DETECTED Final   Shigella/Enteroinvasive E coli (EIEC) NOT DETECTED NOT DETECTED Final   Cryptosporidium NOT DETECTED NOT DETECTED Final   Cyclospora cayetanensis NOT DETECTED NOT DETECTED Final   Entamoeba histolytica NOT DETECTED NOT DETECTED Final   Giardia lamblia NOT DETECTED NOT DETECTED Final   Adenovirus F40/41 NOT DETECTED NOT DETECTED Final   Astrovirus NOT DETECTED NOT DETECTED Final   Norovirus GI/GII NOT DETECTED NOT DETECTED Final   Rotavirus A NOT DETECTED NOT DETECTED Final   Sapovirus (I, II, IV, and V) NOT DETECTED NOT DETECTED Final  C difficile quick scan w PCR reflex     Status: Abnormal   Collection Time: 11/03/16 10:04 AM  Result Value Ref Range Status   C Diff antigen POSITIVE (A) NEGATIVE Final   C Diff toxin NEGATIVE NEGATIVE Final   C Diff interpretation Results  are indeterminate. See PCR results.  Final  Clostridium Difficile by PCR     Status: Abnormal   Collection Time: 11/03/16 10:04 AM  Result Value Ref Range Status   Toxigenic C Difficile by pcr POSITIVE (A) NEGATIVE Final    Comment: Positive for toxigenic C. difficile with little to no toxin production. Only treat if clinical presentation suggests symptomatic illness.      Scheduled Meds: . enoxaparin (LOVENOX) injection  40 mg Subcutaneous Q24H  . metoprolol tartrate  25 mg Oral BID  . sodium chloride flush  10-40 mL Intracatheter Q12H  . tamsulosin  0.4 mg Oral Daily  . vancomycin  125 mg Oral Q6H  . zinc oxide   Topical TID   Continuous Infusions: . acetaminophen Stopped (11/06/16 2210)  . ampicillin-sulbactam (UNASYN) IV 3 g (11/07/16 2706)    Assessment/Plan:  1. Acute diverticulitis with gas formation. Continue antibiotics 2. Diarrhea. Stool positive for C. Difficile. Oral vancomycin  3. Obstructive nephrolithiasis on the left with slight hydronephrosis. Lithotripsy done Thursday. Stones still present on CT scan underwent cystoscopy with urethroscopy as above urology following symptoms improved 4. Clinical sepsis with left back abscess, leukocytosis and tachycardia. continue unasyn. IV Unasyn I will discuss with I ID regarding antibiotic choice 5. Acute rhabdomyolysis. Improved. 6. Wide complex tachycardia nonsustained. Cardiology thinks this could be paroxysmal atrial fibrillation with aberrancy. Patient on metoprolol. Restart aspirin 7. Hypomagnesemia. Replaced 8. Hypokalemia replaced 9. History of CLL with chronically elevated white count 10. Erythema on the buttock. Desitin cream started.  Code Status:     Code Status Orders        Start     Ordered   10/29/16 1526  Full code  Continuous     10/29/16 1525    Code Status History    Date Active Date Inactive Code Status Order ID Comments User Context   This patient has a current code  status but no historical  code status.     Family Communication: Wife at the bedside Disposition Plan: Potentially Home with home antibiotics in the next day or 2 Antibiotics:  Unasyn  By mouth vancomycin  Time spent: 25 minutes. Case discussed with  Dr. Virgel Manifold, [facility]

## 2016-11-08 ENCOUNTER — Telehealth: Payer: Self-pay | Admitting: Urology

## 2016-11-08 ENCOUNTER — Other Ambulatory Visit: Payer: Self-pay | Admitting: *Deleted

## 2016-11-08 DIAGNOSIS — N200 Calculus of kidney: Secondary | ICD-10-CM

## 2016-11-08 LAB — BASIC METABOLIC PANEL
ANION GAP: 6 (ref 5–15)
BUN: 16 mg/dL (ref 6–20)
CALCIUM: 8.7 mg/dL — AB (ref 8.9–10.3)
CO2: 27 mmol/L (ref 22–32)
Chloride: 105 mmol/L (ref 101–111)
Creatinine, Ser: 1.13 mg/dL (ref 0.61–1.24)
GFR calc Af Amer: >60 mL/min (ref >60)
GLUCOSE: 88 mg/dL (ref 65–99)
Potassium: 3.7 mmol/L (ref 3.5–5.1)
Sodium: 138 mmol/L (ref 135–145)

## 2016-11-08 MED ORDER — FLUCONAZOLE IN SODIUM CHLORIDE 400-0.9 MG/200ML-% IV SOLN
400.0000 mg | INTRAVENOUS | Status: DC
  Administered 2016-11-08: 400 mg via INTRAVENOUS
  Filled 2016-11-08 (×2): qty 200

## 2016-11-08 MED ORDER — IMMUNE GLOBULIN (HUMAN) 20 GM/200ML IV SOLN
650.0000 mg/kg | Freq: Once | INTRAVENOUS | Status: AC
  Administered 2016-11-08: 80 g via INTRAVENOUS
  Filled 2016-11-08: qty 800

## 2016-11-08 NOTE — Telephone Encounter (Signed)
[place], Dr. Jeffie Pollock wanted this patient to follow up with a KUB and A RUS prior to his app on 11-22-16 but there are no orders in. He will be seeing Larene Beach.  Thanks,  michelle

## 2016-11-08 NOTE — Progress Notes (Signed)
Patient ID: DAVELLE ANSELMI, male   DOB: 07/04/1940, 76 y.o.   MRN: [id number]    [facility] PROGRESS NOTE  KRIKOR WILLET HLK:[id number] DOB: July 30, 1940 DOA: 10/29/2016 PCP: [name], MD  HPI/Subjective: Abdominal pain improved. Patient seen by physical therapist , recommends home health  Objective: Vitals:   11/07/16 1342 11/07/16 2300  BP: (!) 96/51 100/61  Pulse: 91 88  Resp: 20 20  Temp: 98 F (36.7 C) 98.3 F (36.8 C)    Filed Weights   10/29/16 0957  Weight: 270 lb (122.5 kg)    ROS: Review of Systems  Constitutional: Negative for chills and fever.  Eyes: Negative for blurred vision.  Respiratory: Negative for cough and shortness of breath.   Cardiovascular: Negative for chest pain.  Gastrointestinal: Negative for abdominal pain, diarrhea, nausea and vomiting.  Genitourinary: Negative for dysuria and flank pain.  Musculoskeletal: Negative for joint pain.  Neurological: Negative for dizziness and headaches.   Exam: Physical Exam  Constitutional: He is oriented to person, place, and time.  HENT:  Nose: No mucosal edema.  Mouth/Throat: No oropharyngeal exudate or posterior oropharyngeal edema.  Eyes: Conjunctivae, EOM and lids are normal. Pupils are equal, round, and reactive to light.  Neck: No JVD present. Carotid bruit is not present. No edema present. No thyroid mass and no thyromegaly present.  Cardiovascular: Regular rhythm, S1 normal and S2 normal.  Exam reveals no gallop.   No murmur heard. Pulses:      Dorsalis pedis pulses are 2+ on the right side, and 2+ on the left side.  Respiratory: No respiratory distress. He has decreased breath sounds in the right lower field and the left lower field. He has no wheezes. He has no rhonchi. He has no rales.  GI: Soft. Bowel sounds are normal.  Musculoskeletal:       Right ankle: He exhibits swelling.       Left ankle: He exhibits swelling.  Lymphadenopathy:    He has no cervical adenopathy.   Neurological: He is alert and oriented to person, place, and time. No cranial nerve deficit.  Skin: Skin is warm. Nails show no clubbing.  Left upper back covered with bandage  Psychiatric: He has a normal mood and affect.      Data Reviewed: Basic Metabolic Panel:  Recent Labs Lab 11/03/16 0429 11/05/16 0454 11/06/16 0525 11/08/16 0530  NA 137 139 139 138  K 3.9 3.8 3.9 3.7  CL 102 105 104 105  CO2 25 28 28 27   GLUCOSE 100* 99 107* 88  BUN 9 14 13 16   CREATININE 1.15 1.15 1.30* 1.13  CALCIUM 9.0 8.8* 8.7* 8.7*   Liver Function Tests: No results for input(s): AST, ALT, ALKPHOS, BILITOT, PROT, ALBUMIN in the last 168 hours. CBC:  Recent Labs Lab 11/03/16 0429 11/05/16 0454 11/06/16 0525  WBC 27.3* 28.5* 24.2*  HGB 14.8 14.1 13.4  HCT 44.5 42.3 39.4*  MCV 93.5 92.9 92.8  PLT 325 328 325   Cardiac Enzymes: No results for input(s): CKTOTAL, CKMB, CKMBINDEX, TROPONINI in the last 168 hours.   Recent Results (from the past 240 hour(s))  Anaerobic culture     Status: None   Collection Time: 10/30/16  8:05 AM  Result Value Ref Range Status   Specimen Description BACK MIDDLE UPPER BACK  Final   Special Requests NONE  Final   Culture   Final    NO ANAEROBES ISOLATED Performed at [facility], [address], [place], [place] [address]    Report Status 11/03/2016 FINAL  Final  Aerobic Culture (superficial specimen)     Status: None   Collection Time: 10/30/16  8:05 AM  Result Value Ref Range Status   Specimen Description BACK UPPER LEFT  Final   Special Requests ADDED 11/01/16  Final   Gram Stain   Final    FEW WBC PRESENT, PREDOMINANTLY PMN FEW GRAM POSITIVE COCCI IN PAIRS RARE GRAM POSITIVE RODS Performed at [facility], [address] [place], [place] [address]    Culture FEW STAPHYLOCOCCUS AUREUS  Final   Report Status 11/04/2016 FINAL  Final   Organism ID, Bacteria STAPHYLOCOCCUS AUREUS  Final      Susceptibility   Staphylococcus aureus  - MIC*    CIPROFLOXACIN <=0.5 SENSITIVE Sensitive     ERYTHROMYCIN <=0.25 SENSITIVE Sensitive     GENTAMICIN <=0.5 SENSITIVE Sensitive     OXACILLIN <=0.25 SENSITIVE Sensitive     TETRACYCLINE <=1 SENSITIVE Sensitive     VANCOMYCIN 1 SENSITIVE Sensitive     TRIMETH/SULFA <=10 SENSITIVE Sensitive     CLINDAMYCIN <=0.25 SENSITIVE Sensitive     RIFAMPIN <=0.5 SENSITIVE Sensitive     Inducible Clindamycin NEGATIVE Sensitive     * FEW STAPHYLOCOCCUS AUREUS  Gastrointestinal Panel by PCR , Stool     Status: Abnormal   Collection Time: 11/03/16 10:04 AM  Result Value Ref Range Status   Campylobacter species NOT DETECTED NOT DETECTED Final   Plesimonas shigelloides NOT DETECTED NOT DETECTED Final   Salmonella species NOT DETECTED NOT DETECTED Final   Yersinia enterocolitica NOT DETECTED NOT DETECTED Final   Vibrio species NOT DETECTED NOT DETECTED Final   Vibrio cholerae NOT DETECTED NOT DETECTED Final   Enteroaggregative E coli (EAEC) NOT DETECTED NOT DETECTED Final   Enteropathogenic E coli (EPEC) DETECTED (A) NOT DETECTED Final    Comment: RESULT CALLED TO, READ BACK BY AND VERIFIED WITH: SUSANA PATINO 11/03/16 @ 1411  MLK    Enterotoxigenic E coli (ETEC) NOT DETECTED NOT DETECTED Final   Shiga like toxin producing E coli (STEC) NOT DETECTED NOT DETECTED Final   Shigella/Enteroinvasive E coli (EIEC) NOT DETECTED NOT DETECTED Final   Cryptosporidium NOT DETECTED NOT DETECTED Final   Cyclospora cayetanensis NOT DETECTED NOT DETECTED Final   Entamoeba histolytica NOT DETECTED NOT DETECTED Final   Giardia lamblia NOT DETECTED NOT DETECTED Final   Adenovirus F40/41 NOT DETECTED NOT DETECTED Final   Astrovirus NOT DETECTED NOT DETECTED Final   Norovirus GI/GII NOT DETECTED NOT DETECTED Final   Rotavirus A NOT DETECTED NOT DETECTED Final   Sapovirus (I, II, IV, and V) NOT DETECTED NOT DETECTED Final  C difficile quick scan w PCR reflex     Status: Abnormal   Collection Time: 11/03/16 10:04  AM  Result Value Ref Range Status   C Diff antigen POSITIVE (A) NEGATIVE Final   C Diff toxin NEGATIVE NEGATIVE Final   C Diff interpretation Results are indeterminate. See PCR results.  Final  Clostridium Difficile by PCR     Status: Abnormal   Collection Time: 11/03/16 10:04 AM  Result Value Ref Range Status   Toxigenic C Difficile by pcr POSITIVE (A) NEGATIVE Final    Comment: Positive for toxigenic C. difficile with little to no toxin production. Only treat if clinical presentation suggests symptomatic illness.      Scheduled Meds: . aspirin EC  325 mg Oral Daily  . enoxaparin (LOVENOX) injection  40 mg  Subcutaneous Q24H  . metoprolol tartrate  25 mg Oral BID  . sodium chloride flush  10-40 mL Intracatheter Q12H  . tamsulosin  0.4 mg Oral Daily  . vancomycin  125 mg Oral Q6H  . zinc oxide   Topical TID   Continuous Infusions: . ampicillin-sulbactam (UNASYN) IV Stopped (11/08/16 1008)    Assessment/Plan:  1. Acute diverticulitis with gas formation. Continue antibioticsChanged to IV Rocephin and oral vancomycin  2. Diarrhea. Stool positive for C. Difficile. Oral vancomycin  3. Obstructive nephrolithiasis on the left with slight hydronephrosis. Lithotripsy done Thursday. Stones still present on CT scan underwent cystoscopy with urethroscopy as above urology following symptoms improved 4. Clinical sepsis with left back abscess, leukocytosis and tachycardia. continue unasyn IV ceftriaxone discharge 5. Acute rhabdomyolysis. Improved. 6. Wide complex tachycardia nonsustained. Cardiology thinks this could be paroxysmal atrial fibrillation with aberrancy. Patient on metoprolol. Restart aspirin 7. Hypomagnesemia. Replaced 8. Hypokalemia replaced 9. History of CLL with chronically elevated white count 10. Erythema on the buttock. Desitin cream started.   Expect discharge tomorrow with home health case management has been consulted Code Status:     Code Status Orders         Start     Ordered   10/29/16 1526  Full code  Continuous     10/29/16 1525    Code Status History    Date Active Date Inactive Code Status Order ID Comments User Context   This patient has a current code status but no historical code status.     Family Communication: Wife at the bedside Disposition Plan:  Discharge tomm Antibiotics:  Unasyn  By mouth vancomycin  Time spent: 25 minutes. Case discussed with  Dr. Virgel Manifold, [facility]

## 2016-11-08 NOTE — Progress Notes (Signed)
Clinical Education officer, museum (CSW) met with patient and his wife at bedside to discuss D/C plan. CSW explained that PT is now recommending home health. CSW also explained the need for IV Abx. Patient and wife reported that they prefer to do the IV Abx at home. Patient and wife stated that they feel comfortable administering the IV Abx. Patient reported that he now plans on going home instead of [facility]. RN case manager aware of above. CSW will continue to follow and assist as needed.   [employer], LCSW [phone]

## 2016-11-08 NOTE — Progress Notes (Signed)
Nutrition Brief Note  Patient identified for length of stay  Wt Readings from Last 15 Encounters:  10/29/16 270 lb (122.5 kg)    Body mass index is 34.67 kg/m. Patient meets criteria for obese class I based on current BMI.   Current diet order is regular, patient is consuming approximately 100% of meals at this time. Labs and medications reviewed.   No nutrition interventions warranted at this time. If nutrition issues arise, please consult RD.   William Kennedy. Shanya Ferriss, MS, RD LDN Inpatient Clinical Dietitian Pager [phone]

## 2016-11-08 NOTE — Progress Notes (Signed)
Consult Note: IVIG for low IgG levels in CLL  Patient will receive one dose while inpatient (confirmed with MD)  Will order IVIG 650 mg/kg IV once.  Lenis Noon, PharmD 11/08/16 3:33 PM

## 2016-11-08 NOTE — Progress Notes (Signed)
Pharmacy Antibiotic Note  Pharmacy consulted to dose fluconazole for skin infection around I&D site  Plan: Fluconazole 400 mg IV daily  Height: 6\' 2"  (188 cm) Weight: 270 lb (122.5 kg) IBW/kg (Calculated) : 82.2  Temp (24hrs), Avg:98.3 F (36.8 C), Min:98.3 F (36.8 C), Max:98.3 F (36.8 C)   Recent Labs Lab 11/03/16 0429 11/05/16 0454 11/06/16 0525 11/08/16 0530  WBC 27.3* 28.5* 24.2*  --   CREATININE 1.15 1.15 1.30* 1.13    Estimated Creatinine Clearance: 78.5 mL/min (by C-G formula based on SCr of 1.13 mg/dL).    No Known Allergies  Thank you for allowing pharmacy to be a part of this patient's care.  Lenis Noon, PharmD Clinical Pharmacist 11/08/2016 2:56 PM

## 2016-11-08 NOTE — Care Management Note (Addendum)
Case Management Note  Patient Details  Name: William Kennedy MRN: [id number] Date of Birth: [birth year]  Subjective/Objective:    Spoke with wife regarding home discharge plan. Wife states she feels comfortable administering the IV antibiotics. She is aware a nurse and physical therapist will come out. She denies the need for a home health aide. Offered choice of home health agencies. Referral to [facility] for SN and PT. Patient will need a bedside commode. Ordered from [facility]. Patient has 2 penrose drains.  WILL NEED WOUND CARE ORDERS ON DC SUMMARY                Action/Plan: Following progression. It is anticipated patient will discharge home tomorrow.   Expected Discharge Date:                  Expected Discharge Plan:  [place]  In-House Referral:     Discharge planning Services  CM Consult  Post Acute Care Choice:  Durable Medical Equipment, Home Health Choice offered to:  Spouse  DME Arranged:  Bedside commode DME Agency:     HH Arranged:  RN, PT [place] Agency:  [facility]  Status of Service:  In process, will continue to follow  If discussed at Long Length of Stay Meetings, dates discussed:    Additional Comments:  Jolly Mango, RN 11/08/2016, 1:59 PM

## 2016-11-08 NOTE — Telephone Encounter (Signed)
Imaging ordered and  [facility] informed to sign the orders.

## 2016-11-08 NOTE — Progress Notes (Signed)
[place] INFECTIOUS DISEASE PROGRESS NOTE Date of Admission:  10/29/2016     ID: JASE REEP is a 76 y.o. male with  Diverticulitis and MSSA back abscess Active Problems:   Cellulitis and abscess of trunk   Left ureteral stone  Subjective: No fevers, still weak.  ROS  Eleven systems are reviewed and negative except per hpi  Medications:  Antibiotics Given (last 72 hours)    Date/Time Action Medication Dose Rate   11/05/16 1730 Given   vancomycin (VANCOCIN) 50 mg/mL oral solution 125 mg 125 mg    11/05/16 1740 New Bag/Given   Ampicillin-Sulbactam (UNASYN) 3 g in sodium chloride 0.9 % 100 mL IVPB 3 g 200 mL/hr   11/05/16 2030 New Bag/Given   Ampicillin-Sulbactam (UNASYN) 3 g in sodium chloride 0.9 % 100 mL IVPB 3 g 200 mL/hr   11/05/16 2158 Given   vancomycin (VANCOCIN) 50 mg/mL oral solution 125 mg 125 mg    11/06/16 0240 Given   vancomycin (VANCOCIN) 50 mg/mL oral solution 125 mg 125 mg    11/06/16 0240 New Bag/Given   Ampicillin-Sulbactam (UNASYN) 3 g in sodium chloride 0.9 % 100 mL IVPB 3 g 200 mL/hr   11/06/16 0906 New Bag/Given   Ampicillin-Sulbactam (UNASYN) 3 g in sodium chloride 0.9 % 100 mL IVPB 3 g 200 mL/hr   11/06/16 1318 Given   vancomycin (VANCOCIN) 50 mg/mL oral solution 125 mg 125 mg    11/06/16 1345 New Bag/Given   Ampicillin-Sulbactam (UNASYN) 3 g in sodium chloride 0.9 % 100 mL IVPB 3 g 200 mL/hr   11/06/16 2152 New Bag/Given   Ampicillin-Sulbactam (UNASYN) 3 g in sodium chloride 0.9 % 100 mL IVPB 3 g 200 mL/hr   11/06/16 2153 Given   vancomycin (VANCOCIN) 50 mg/mL oral solution 125 mg 125 mg    11/07/16 0300 Given   vancomycin (VANCOCIN) 50 mg/mL oral solution 125 mg 125 mg    11/07/16 0400 New Bag/Given   Ampicillin-Sulbactam (UNASYN) 3 g in sodium chloride 0.9 % 100 mL IVPB 3 g 200 mL/hr   11/07/16 0821 Given   vancomycin (VANCOCIN) 50 mg/mL oral solution 125 mg 125 mg    11/07/16 0821 New Bag/Given   Ampicillin-Sulbactam (UNASYN) 3 g in  sodium chloride 0.9 % 100 mL IVPB 3 g 200 mL/hr   11/07/16 1900 Given   vancomycin (VANCOCIN) 50 mg/mL oral solution 125 mg 125 mg    11/07/16 1900 New Bag/Given   Ampicillin-Sulbactam (UNASYN) 3 g in sodium chloride 0.9 % 100 mL IVPB 3 g 200 mL/hr   11/08/16 0100 Given   vancomycin (VANCOCIN) 50 mg/mL oral solution 125 mg 125 mg    11/08/16 0100 New Bag/Given   Ampicillin-Sulbactam (UNASYN) 3 g in sodium chloride 0.9 % 100 mL IVPB 3 g 200 mL/hr   11/08/16 0520 Given   vancomycin (VANCOCIN) 50 mg/mL oral solution 125 mg 125 mg    11/08/16 0700 New Bag/Given   Ampicillin-Sulbactam (UNASYN) 3 g in sodium chloride 0.9 % 100 mL IVPB 3 g 200 mL/hr   11/08/16 0937 Given   vancomycin (VANCOCIN) 50 mg/mL oral solution 125 mg 125 mg    11/08/16 0938 New Bag/Given   Ampicillin-Sulbactam (UNASYN) 3 g in sodium chloride 0.9 % 100 mL IVPB 3 g 200 mL/hr     . aspirin EC  325 mg Oral Daily  . enoxaparin (LOVENOX) injection  40 mg Subcutaneous Q24H  . metoprolol tartrate  25 mg Oral BID  . sodium  chloride flush  10-40 mL Intracatheter Q12H  . tamsulosin  0.4 mg Oral Daily  . vancomycin  125 mg Oral Q6H  . zinc oxide   Topical TID    Objective: Vital signs in last 24 hours: Temp:  [98.3 F (36.8 C)] 98.3 F (36.8 C) (06/17 2300) Pulse Rate:  [88] 88 (06/17 2300) Resp:  [20] 20 (06/17 2300) BP: (100)/(61) 100/61 (06/17 2300) SpO2:  [95 %] 95 % (06/17 2300) Constitutional: He is oriented to person, place, and time. Obese, disheveled. HENT: anicteric Mouth/Throat: Oropharynx is clear and dry . No oropharyngeal exudate.  Cardiovascular: Normal rate, regular rhythm and normal heart sounds Pulmonary/Chest: Effort normal and breath sounds normal. No respiratory distress. He has no wheezes.  Abdominal: obese, TTP LLQ Lymphadenopathy: He has no cervical adenopathy.  Neurological: He is alert and oriented to person, place, and time.  Skin: L upper back with incision site with penrose in place with  purulent drainage. Mod surrounding redness and some candidial type skin changes  Psychiatric: He has a normal mood and affect. His behavior is normal.    Lab Results  Recent Labs  11/06/16 0525 11/08/16 0530  WBC 24.2*  --   HGB 13.4  --   HCT 39.4*  --   NA 139 138  K 3.9 3.7  CL 104 105  CO2 28 27  BUN 13 16  CREATININE 1.30* 1.13    Microbiology: Results for orders placed or performed during the hospital encounter of 10/29/16  Blood Culture (routine x 2)     Status: None   Collection Time: 10/29/16 12:41 PM  Result Value Ref Range Status   Specimen Description BLOOD RIGHT HAND  Final   Special Requests   Final    BOTTLES DRAWN AEROBIC AND ANAEROBIC Blood Culture adequate volume   Culture NO GROWTH 5 DAYS  Final   Report Status 11/03/2016 FINAL  Final  Blood Culture (routine x 2)     Status: None   Collection Time: 10/29/16 12:41 PM  Result Value Ref Range Status   Specimen Description BLOOD RIGHT HAND  Final   Special Requests   Final    BOTTLES DRAWN AEROBIC AND ANAEROBIC Blood Culture adequate volume   Culture NO GROWTH 5 DAYS  Final   Report Status 11/03/2016 FINAL  Final  Aerobic/Anaerobic Culture (surgical/deep wound)     Status: None   Collection Time: 10/29/16  1:11 PM  Result Value Ref Range Status   Specimen Description BACK upper middle  Final   Special Requests NONE  Final   Gram Stain NO WBC SEEN RARE GRAM POSITIVE COCCI IN PAIRS   Final   Culture   Final    FEW STAPHYLOCOCCUS AUREUS NO ANAEROBES ISOLATED Performed at [facility], [place] [address], [place], [place] [address]    Report Status 11/03/2016 FINAL  Final   Organism ID, Bacteria STAPHYLOCOCCUS AUREUS  Final      Susceptibility   Staphylococcus aureus - MIC*    CIPROFLOXACIN <=0.5 SENSITIVE Sensitive     ERYTHROMYCIN <=0.25 SENSITIVE Sensitive     GENTAMICIN <=0.5 SENSITIVE Sensitive     OXACILLIN <=0.25 SENSITIVE Sensitive     TETRACYCLINE <=1 SENSITIVE Sensitive      VANCOMYCIN 1 SENSITIVE Sensitive     TRIMETH/SULFA <=10 SENSITIVE Sensitive     CLINDAMYCIN <=0.25 SENSITIVE Sensitive     RIFAMPIN <=0.5 SENSITIVE Sensitive     Inducible Clindamycin NEGATIVE Sensitive     * FEW STAPHYLOCOCCUS  AUREUS  Anaerobic culture     Status: None   Collection Time: 10/30/16  8:05 AM  Result Value Ref Range Status   Specimen Description BACK MIDDLE UPPER BACK  Final   Special Requests NONE  Final   Culture   Final    NO ANAEROBES ISOLATED Performed at [facility], [address], [place], [place] [address]    Report Status 11/03/2016 FINAL  Final  Aerobic Culture (superficial specimen)     Status: None   Collection Time: 10/30/16  8:05 AM  Result Value Ref Range Status   Specimen Description BACK UPPER LEFT  Final   Special Requests ADDED 11/01/16  Final   Gram Stain   Final    FEW WBC PRESENT, PREDOMINANTLY PMN FEW GRAM POSITIVE COCCI IN PAIRS RARE GRAM POSITIVE RODS Performed at [facility], [place] [address] [place], [place] [address]    Culture FEW STAPHYLOCOCCUS AUREUS  Final   Report Status 11/04/2016 FINAL  Final   Organism ID, Bacteria STAPHYLOCOCCUS AUREUS  Final      Susceptibility   Staphylococcus aureus - MIC*    CIPROFLOXACIN <=0.5 SENSITIVE Sensitive     ERYTHROMYCIN <=0.25 SENSITIVE Sensitive     GENTAMICIN <=0.5 SENSITIVE Sensitive     OXACILLIN <=0.25 SENSITIVE Sensitive     TETRACYCLINE <=1 SENSITIVE Sensitive     VANCOMYCIN 1 SENSITIVE Sensitive     TRIMETH/SULFA <=10 SENSITIVE Sensitive     CLINDAMYCIN <=0.25 SENSITIVE Sensitive     RIFAMPIN <=0.5 SENSITIVE Sensitive     Inducible Clindamycin NEGATIVE Sensitive     * FEW STAPHYLOCOCCUS AUREUS  Gastrointestinal Panel by PCR , Stool     Status: Abnormal   Collection Time: 11/03/16 10:04 AM  Result Value Ref Range Status   Campylobacter species NOT DETECTED NOT DETECTED Final   Plesimonas shigelloides NOT DETECTED NOT DETECTED Final   Salmonella species NOT DETECTED  NOT DETECTED Final   Yersinia enterocolitica NOT DETECTED NOT DETECTED Final   Vibrio species NOT DETECTED NOT DETECTED Final   Vibrio cholerae NOT DETECTED NOT DETECTED Final   Enteroaggregative E coli (EAEC) NOT DETECTED NOT DETECTED Final   Enteropathogenic E coli (EPEC) DETECTED (A) NOT DETECTED Final    Comment: RESULT CALLED TO, READ BACK BY AND VERIFIED WITH: SUSANA PATINO 11/03/16 @ 1411  MLK    Enterotoxigenic E coli (ETEC) NOT DETECTED NOT DETECTED Final   Shiga like toxin producing E coli (STEC) NOT DETECTED NOT DETECTED Final   Shigella/Enteroinvasive E coli (EIEC) NOT DETECTED NOT DETECTED Final   Cryptosporidium NOT DETECTED NOT DETECTED Final   Cyclospora cayetanensis NOT DETECTED NOT DETECTED Final   Entamoeba histolytica NOT DETECTED NOT DETECTED Final   Giardia lamblia NOT DETECTED NOT DETECTED Final   Adenovirus F40/41 NOT DETECTED NOT DETECTED Final   Astrovirus NOT DETECTED NOT DETECTED Final   Norovirus GI/GII NOT DETECTED NOT DETECTED Final   Rotavirus A NOT DETECTED NOT DETECTED Final   Sapovirus (I, II, IV, and V) NOT DETECTED NOT DETECTED Final  C difficile quick scan w PCR reflex     Status: Abnormal   Collection Time: 11/03/16 10:04 AM  Result Value Ref Range Status   C Diff antigen POSITIVE (A) NEGATIVE Final   C Diff toxin NEGATIVE NEGATIVE Final   C Diff interpretation Results are indeterminate. See PCR results.  Final  Clostridium Difficile by PCR     Status: Abnormal   Collection Time: 11/03/16 10:04 AM  Result  Value Ref Range Status   Toxigenic C Difficile by pcr POSITIVE (A) NEGATIVE Final    Comment: Positive for toxigenic C. difficile with little to no toxin production. Only treat if clinical presentation suggests symptomatic illness.    Studies/Results: No results found.  Assessment/Plan: COREYON NICOTRA is a 76 y.o. male with CLL admitted with MSSA L upper back abscess and found also to have ruptured diverticulitis with abscess and now C diff  as well.   He is very weak and deconditioned. Has a ureteral stone with mild hydro as well.  His IGG levels are low. Having quite a prolonged illness and will benefit from IVIG.  Recommendations For ease of use at home Dr Tamala Julian suggests IV ceftriaxone and I agree- would give a dose tomorrow before DC and then can start IV at home Wednesday. Will need oral flagyl as well for anaerobic infection in the ruptured diverticuli. Vanco for the C diff- continue 10 days after stopping all other abx I have ordered IVIG. Can give 5 days oral fluconazole for candidiasis around his I and D site.  At the end of his stop date for IV abx June 25th will need to fu Dr Tamala Julian- I will be out of town. At that time can change to oral regimen Augmentin or continue IV if needed Thank you very much for the consult. Will follow with you.  [place], Melitza Metheny P   11/08/2016, 2:28 PM

## 2016-11-08 NOTE — Care Management (Signed)
Case discuss with Dr. Ola Spurr. First dose of rocephin will be given tomorrow. Home IV antibiotics can start on Wednesday. Corene Cornea with [facility] notified.

## 2016-11-08 NOTE — Progress Notes (Signed)
Chaplain made a follow up visit with pt. Pt was lying on bed and his wife at the bedside. Pt stated that his was feeling much better and that he had made great progress with treatment received. He expressed having pain but no so much as he did later last week. Pt was delightful of the fact that he might be discharged tomorrow and asked the [facility] to prayer for him. CH offered prayers for pt, his wife and other family members who were not present.

## 2016-11-08 NOTE — Progress Notes (Signed)
Infectious Disease Long Term IV Antibiotic Orders  Diagnosis: MSSA back abscess and   Culture results MSSA and diverticulitis with rupture  Allergies: No Known Allergies  Discharge antibiotics Ceftriaxone 1 grams every      24         hours Oral flagyl 500 TID  Oral vancomycin 125 qid  PICC Care per protocol Labs weekly while on IV antibiotics      CBC w diff   Comprehensive met panel  Planned duration of antibiotics June 25th   Stop date June 25th   Follow up clinic date TBD  FAX weekly labs to [phone]  Leonel Ramsay, MD

## 2016-11-08 NOTE — Telephone Encounter (Signed)
-----   Message from Irine Seal, MD sent at 11/07/2016  8:06 AM EDT ----- I don't know if I sent this but William Kennedy will need post op f/u in 4-6 weeks with a KUB and renal US for f/u of his recent ureteroscopy.

## 2016-11-09 MED ORDER — FLUCONAZOLE 100 MG PO TABS: 400.0000 mg | ORAL_TABLET | Freq: Every day | ORAL | Status: DC

## 2016-11-09 MED ORDER — CEFTRIAXONE SODIUM 1 G IJ SOLR
1.0000 g | Freq: Once | INTRAMUSCULAR | Status: AC
  Administered 2016-11-09: 1 g via INTRAVENOUS
  Filled 2016-11-09: qty 10

## 2016-11-09 MED ORDER — DEXTROSE 5 % IV SOLN: 1.0000 g | INTRAVENOUS | Status: DC

## 2016-11-09 MED ORDER — METRONIDAZOLE 500 MG PO TABS: 500.0000 mg | ORAL_TABLET | Freq: Three times a day (TID) | ORAL | 0 refills | Status: AC

## 2016-11-09 MED ORDER — TRAMADOL HCL 50 MG PO TABS: 50.0000 mg | ORAL_TABLET | Freq: Four times a day (QID) | ORAL | 0 refills | Status: DC | PRN

## 2016-11-09 MED ORDER — CEFTRIAXONE SODIUM 1 G IJ SOLR: 1.0000 g | INTRAMUSCULAR | Status: DC

## 2016-11-09 MED ORDER — VANCOMYCIN 50 MG/ML ORAL SOLUTION: 125.0000 mg | Freq: Four times a day (QID) | ORAL | 0 refills | Status: AC

## 2016-11-09 NOTE — Care Management Note (Signed)
Case Management Note  Patient Details  Name: William Kennedy MRN: [id number] Date of Birth: 02-13-1941  Subjective/Objective:   Discharging home with home health PT.                  Action/Plan: [facility] notified of discharge. DME has been delivered.   Expected Discharge Date:  11/09/16               Expected Discharge Plan:  [place]  In-House Referral:     Discharge planning Services  CM Consult  Post Acute Care Choice:  Durable Medical Equipment, Home Health Choice offered to:  Spouse  DME Arranged:  Bedside commode DME Agency:     HH Arranged:  RN, PT HH Agency:  [facility]  Status of Service:  Completed, signed off  If discussed at [place] of Stay Meetings, dates discussed:    Additional Comments:  Jolly Mango, RN 11/09/2016, 3:00 PM

## 2016-11-09 NOTE — Discharge Planning (Addendum)
Patient Discharge papers given, explained and educated.  Patient informed of suggested FU appts and appts made.  [facility] (RN and PT) will be calling family to set up first appt and assessments.  [facility] let intact to receive home antibiotics, which HH indicates will be delivered to patient's home this evening. Scripts given.  RN assessment and VS revealed stability for discharge.Today's dose of Ceftriaxone was given prior to discharge.  Once ready, patient will be wheeled to front and family transporting home via car.

## 2016-11-09 NOTE — Progress Notes (Signed)
PT Cancellation Note  Patient Details Name: William Kennedy MRN: [id number] DOB: 08/27/1940   Cancelled Treatment:    Reason Eval/Treat Not Completed: Patient declined, no reason specified   Pt in bed.  Pt/wife stating he is going home today.  Declines therapy and stated they are comfortable with his mobility skills at this time.  They have no further questions for therapy.   Chesley Noon 11/09/2016, 11:24 AM

## 2016-11-09 NOTE — Progress Notes (Signed)
Pt is A&Ox4. VSS. Pt is having several loose smear BMs this shift. Barrier cream applied. Dressing changed to drain site. Foam and gauze used as any kind of tape tears skin. Wife at bedside.

## 2016-11-09 NOTE — Discharge Instructions (Signed)
Ureteral Stent Implantation, Care After Refer to this sheet in the next few weeks. These instructions provide you with information about caring for yourself after your procedure. Your health care provider may also give you more specific instructions. Your treatment has been planned according to current medical practices, but problems sometimes occur. Call your health care provider if you have any problems or questions after your procedure. What can I expect after the procedure? After the procedure, it is common to have:  Nausea.  Mild pain when you urinate. You may feel this pain in your lower back or lower abdomen. Pain should stop within a few minutes after you urinate. This may last for up to 1 week.  A small amount of blood in your urine for several days.  Follow these instructions at home:  Medicines  Take over-the-counter and prescription medicines only as told by your health care provider.  If you were prescribed an antibiotic medicine, take it as told by your health care provider. Do not stop taking the antibiotic even if you start to feel better.  Do not drive for 24 hours if you received a sedative.  Do not drive or operate heavy machinery while taking prescription pain medicines. Activity  Return to your normal activities as told by your health care provider. Ask your health care provider what activities are safe for you.  Do not lift anything that is heavier than 10 lb (4.5 kg). Follow this limit for 1 week after your procedure, or for as long as told by your health care provider. General instructions  Watch for any blood in your urine. Call your health care provider if the amount of blood in your urine increases.  If you have a catheter: ? Follow instructions from your health care provider about taking care of your catheter and collection bag. ? Do not take baths, swim, or use a hot tub until your health care provider approves.  Drink enough fluid to keep your urine  clear or pale yellow.  Keep all follow-up visits as told by your health care provider. This is important. Contact a health care provider if:  You have pain that gets worse or does not get better with medicine, especially pain when you urinate.  You have difficulty urinating.  You feel nauseous or you vomit repeatedly during a period of more than 2 days after the procedure. Get help right away if:  Your urine is dark red or has blood clots in it.  You are leaking urine (have incontinence).  The end of the stent comes out of your urethra.  You cannot urinate.  You have sudden, sharp, or severe pain in your abdomen or lower back.  You have a fever. This information is not intended to replace advice given to you by your health care provider. Make sure you discuss any questions you have with your health care provider. Document Released: 01/10/2013 Document Revised: 10/16/2015 Document Reviewed: 11/22/2014 Elsevier Interactive Patient Education  2018 [place] [facility] [place]:  Regular diet  DISCHARGE CONDITION:  Stable  ACTIVITY:  Activity as tolerated  OXYGEN:  Home Oxygen: No.   Oxygen Delivery: room air  DISCHARGE LOCATION:  home    ADDITIONAL DISCHARGE INSTRUCTION: antibitoics as per Dr. Delora Fuel orders   If you experience worsening of your admission symptoms, develop shortness of breath, life threatening emergency, suicidal or homicidal thoughts you must seek medical attention immediately by calling 911 or calling your MD  immediately  if symptoms less severe.  You Must read complete instructions/literature along with all the possible adverse reactions/side effects for all the Medicines you take and that have been prescribed to you. Take any new Medicines after you have completely understood and accpet all the possible adverse reactions/side effects.   Please note  You were cared for by a hospitalist during your  hospital stay. If you have any questions about your discharge medications or the care you received while you were in the hospital after you are discharged, you can call the unit and asked to speak with the hospitalist on call if the hospitalist that took care of you is not available. Once you are discharged, your primary care physician will handle any further medical issues. Please note that NO REFILLS for any discharge medications will be authorized once you are discharged, as it is imperative that you return to your primary care physician (or establish a relationship with a primary care physician if you do not have one) for your aftercare needs so that they can reassess your need for medications and monitor your lab values.

## 2016-11-09 NOTE — Discharge Summary (Signed)
William Kennedy at [facility], 76 y.o., DOB [birth year], MRN [id number]. Admission date: 10/29/2016 Discharge Date 11/09/2016 Primary MD [name], MD Admitting Physician Nicholes Mango, MD  Admission Diagnosis  Fall, initial encounter [phone].[id number]] Sepsis, due to unspecified organism [facility]) [A41.9]  Discharge Diagnosis   Active Problems:   Cellulitis and abscess of trunk   Left ureteral stone   Acute diverticulitis with local perforation C. difficile colitis Sepsis due to left back abscess Acute rhabdomyolysis Wide-complex tachycardia Hypo-magnesium anemia Hypokalemia History of CLL IgG levels and low        Hospital Course  William Kennedy  is a 76 y.o. male with a known history of CLL, with baseline WBC at around 25,000 is presenting to the ED after he sustained a fall last night. Patient reports he was feeling extremely weak and legs are giving away. Last night patient slid down to the floor and was unable to get up. He was laying on the floor for approximately 6 hours and then called EMS who helped him to go back to bed. Patient woke up today with body aches. Patient also has his abscess on the back drained by Dr. Tamala Julian, culture has revealed Staphylococcus aureus and patient was started on Augmentin day prior. Patient was admitted for sepsis. He was started on antibiotics and seen by general surgery. Patient started to complain of severe abdominal pain and he was noted to have diverticulitis with local perforation. He was treated with antibiotics. Patient continued to complain of pain therefore had a CT scan of the abdomen which showed left sided renal stone. Patient was seen by urology and underwent cystoscopy with left pectoral pyelogram also had acute uterus copy with holmium laser lithotripsy. Patient's abdominal pain is now resolved. Patient also developed diarrhea during hospitalization and was noted to have C. difficile colitis. Patient now  is on antibiotics as directed by infectious disease. He will need to follow-up with Dr. Tamala Julian and Dr. Ola Spurr to determine the duration of antibiotics. He has home health nurse that will be showing his wife how to administer those antibiotics. Patient doing much better.         Consults  ID.surgery and urology  Significant Tests:  See full reports for all details     Dg Chest 1 View  Result Date: 10/29/2016 CLINICAL DATA:  Pain after fall EXAM: CHEST 1 VIEW COMPARISON:  None. FINDINGS: The heart size and mediastinal contours are within normal limits. Both lungs are clear. The visualized skeletal structures are unremarkable. IMPRESSION: No active disease. Electronically Signed   By: Dorise Bullion III M.D   On: 10/29/2016 11:05   Dg Abd 1 View  Result Date: 11/01/2016 CLINICAL DATA:  Left ureteral stone, lithotripsy on Thursday. EXAM: ABDOMEN - 1 VIEW COMPARISON:  CT abdomen pelvis 10/31/2016. FINDINGS: Faint stone seen in the left kidney on 10/31/2016 are not readily visualized on the current exam. Distal left ureteral stone is again seen, as on yesterday's exam. Vascular calcifications and phleboliths are seen as well. Bowel gas pattern is unremarkable. IMPRESSION: 1. Distal left ureteral stone, as on 10/31/2016. 2. Faint left renal stones seen yesterday are poorly visualized today. Electronically Signed   By: Lorin Picket M.D.   On: 11/01/2016 10:44   Ct Abdomen Pelvis W Contrast  Result Date: 11/05/2016 CLINICAL DATA:  Diverticulitis. Renal stone. Improved left lower quadrant pain. EXAM: CT ABDOMEN AND PELVIS WITH CONTRAST TECHNIQUE: Multidetector CT imaging of the abdomen and pelvis  was performed using the standard protocol following bolus administration of intravenous contrast. CONTRAST:  111mL ISOVUE-300 IOPAMIDOL (ISOVUE-300) INJECTION 61% COMPARISON:  10/31/2016 FINDINGS: Lower chest: Minimal dependent atelectasis in the lung bases. No pleural effusion. Coronary artery  atherosclerosis. Hepatobiliary: No focal liver abnormality is seen. No gallstones, gallbladder wall thickening, or biliary dilatation. Pancreas: Unremarkable. Spleen: Unremarkable. Adrenals/Urinary Tract: Unchanged 6.8 x 5.2 cm partially peripherally calcified cystic lesion arising from the right adrenal gland which may reflect the sequelae of remote hemorrhage or infection. Unchanged slight left adrenal gland thickening. Unchanged 10 cm interpolar right renal cyst and 1.7 cm left upper pole renal cyst. 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 Unchanged two 3 mm nonobstructing calculi in the upper pole/ interpolar left kidney. Unchanged 6 mm distal left ureteral calculus with persistent mild left hydroureteronephrosis. No right renal calculi or hydronephrosis. Unremarkable bladder. Stomach/Bowel: The stomach is within normal limits. There is no evidence of bowel obstruction. Extensive left-sided colonic diverticulosis is again seen with persistent moderate sigmoid colon wall thickening and surrounding inflammatory stranding, similar to prior. The previously described gas and fluid collection associated with the posterior wall of the sigmoid colon is stable to minimally smaller than on the prior study, measuring 4.3 x 2.6 cm. There is now enteric contrast material within this collection indicating communication with the bowel lumen. No new fluid or gas collections are identified. The appendix is unremarkable. Vascular/Lymphatic: Abdominal aortic atherosclerosis without aneurysm. Unchanged 9 mm short axis left common iliac lymph noted. 12 mm short axis left external iliac lymph node is at most minimally larger than on the recent prior study. Subcentimeter short axis para-aortic lymph nodes are unchanged. Reproductive: Marked prostatic enlargement. Other: No intraperitoneal free fluid. 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 IMPRESSION: 1. Unchanged distal left ureteral  calculus with mild hydroureteronephrosis. 2. Similar appearance of acute sigmoid diverticulitis. Stable to minimally decreased size of pericolonic collection/contained perforation, now containing enteric contrast material. 3.  Aortic Atherosclerosis (ICD10-I70.0). Electronically Signed   By: Logan Bores M.D.   On: 11/05/2016 12:04   Dg Chest Port 1 View  Result Date: 11/05/2016 CLINICAL DATA:  Confirm central line placement.  History of leukemia EXAM: PORTABLE CHEST 1 VIEW COMPARISON:  Portable chest x-ray of October 29, 2016 FINDINGS: The lungs are well-expanded. The interstitial markings are coarse. The PICC line tip projects over the midportion of the SVC. There is no pneumothorax or pleural effusion. The heart and pulmonary vascularity are normal. IMPRESSION: Chronic bronchitic-smoking related changes, stable. There is no postprocedure complication following right-sided PICC line placement. There is no acute cardiopulmonary abnormality. Electronically Signed   By: David  Martinique M.D.   On: 11/05/2016 17:15   Dg Knee Complete 4 Views Right  Result Date: 10/29/2016 CLINICAL DATA:  Pain after fall. EXAM: RIGHT KNEE - COMPLETE 4+ VIEW COMPARISON:  None. FINDINGS: No evidence of fracture, dislocation, or joint effusion. No evidence of arthropathy or other focal bone abnormality. Soft tissues are unremarkable. IMPRESSION: Negative. Electronically Signed   By: Dorise Bullion III M.D   On: 10/29/2016 11:04   Dg C-arm 1-60 Min-no Report  Result Date: 11/06/2016 Fluoroscopy was utilized by the requesting physician.  No radiographic interpretation.   Ct Renal Stone Study  Result Date: 10/31/2016 CLINICAL DATA:  Left flank/left lower quadrant abdominal pain EXAM: CT ABDOMEN AND PELVIS WITHOUT CONTRAST TECHNIQUE: Multidetector CT imaging of the abdomen and pelvis was performed following the standard protocol without IV contrast. COMPARISON:  08/21/2009 FINDINGS: Lower chest: Lung  bases are clear. Hepatobiliary:  Unenhanced liver is unremarkable. Gallbladder is unremarkable. No intrahepatic or extrahepatic ductal dilatation. Pancreas: Within normal limits. Spleen: Within normal limits. Adrenals/Urinary Tract: 6.3 cm right renal cyst with rim calcification (series 2/ image 25), unchanged. Mild thickening of the left adrenal gland, unchanged. 10.3 cm posterior right upper pole renal cyst (series 2/ image 37), previously 8.0 cm. No renal calculi or hydronephrosis. 1.7 cm left upper pole renal cyst (series 2/ image 37). Two 3 mm nonobstructing left upper pole renal calculi. Mild left hydroureteronephrosis. Associated 6 mm distal left ureteral calculus (coronal image 102). Bladder is mildly thick-walled although underdistended. Stomach/Bowel: Stomach is within normal limits. No evidence bowel obstruction. Normal appendix (series 2/ image 60). Sigmoid diverticulosis with associated diverticulitis and a 4.4 x 2.8 cm pericolonic fluid/ gas collection (series 2/ image 73), predominantly gas. This does not reflect a drainable fluid collection/abscess. No free air. Vascular/Lymphatic: No evidence of abdominal aortic aneurysm. Atherosclerotic calcifications of the abdominal aorta and branch vessels. Small retroperitoneal lymph nodes, including an 8 mm short axis left common iliac node (series 2/ image 56), previously 7 mm in 2011. Reproductive: Marked prostatomegaly. Other: No abdominopelvic ascites. Musculoskeletal: Degenerative changes of the visualized thoracolumbar spine, most prominent at L4-5. IMPRESSION: 6 mm distal left ureteral calculus with associated mild left hydroureteronephrosis. Two additional nonobstructing left upper pole renal calculi measuring up to 3 mm. Acute sigmoid diverticulitis, with adjacent 4.4 x 2.8 cm pericolonic fluid/ gas collection, predominantly gas. This does not reflect a drainable fluid collection/ abscess given the size and lack of fluid component. No free air. Additional ancillary findings, as  above. Electronically Signed   By: Julian Hy M.D.   On: 10/31/2016 11:28       Today   Subjective:   Fabio Bering feeling well wants to go home  Objective:   Blood pressure (!) 162/72, pulse 87, temperature 98.3 F (36.8 C), temperature source Oral, resp. rate 16, height 6\' 2"  (1.88 m), weight 270 lb (122.5 kg), SpO2 96 %.  .  Intake/Output Summary (Last 24 hours) at 11/09/16 1233 Last data filed at 11/09/16 1022  Gross per 24 hour  Intake             1400 ml  Output              100 ml  Net             1300 ml    Exam VITAL SIGNS: Blood pressure (!) 162/72, pulse 87, temperature 98.3 F (36.8 C), temperature source Oral, resp. rate 16, height 6\' 2"  (1.88 m), weight 270 lb (122.5 kg), SpO2 96 %.  GENERAL:  76 y.o.-year-old patient lying in the bed with no acute distress.  EYES: Pupils equal, round, reactive to light and accommodation. No scleral icterus. Extraocular muscles intact.  HEENT: Head atraumatic, normocephalic. Oropharynx and nasopharynx clear.  NECK:  Supple, no jugular venous distention. No thyroid enlargement, no tenderness.  LUNGS: Normal breath sounds bilaterally, no wheezing, rales,rhonchi or crepitation. No use of accessory muscles of respiration.  CARDIOVASCULAR: S1, S2 normal. No murmurs, rubs, or gallops.  ABDOMEN: Soft, nontender, nondistended. Bowel sounds present. No organomegaly or mass.  EXTREMITIES: No pedal edema, cyanosis, or clubbing.  NEUROLOGIC: Cranial nerves II through XII are intact. Muscle strength 5/5 in all extremities. Sensation intact. Gait not checked.  PSYCHIATRIC: The patient is alert and oriented x 3.  SKIN: No obvious rash, lesion, or ulcer.   Data Review  CBC w Diff:  Lab Results  Component Value Date   WBC 24.2 (H) 11/06/2016   HGB 13.4 11/06/2016   HCT 39.4 (L) 11/06/2016   PLT 325 11/06/2016   LYMPHOPCT 43 10/29/2016   BANDSPCT 1 10/29/2016   MONOPCT 6 10/29/2016   EOSPCT 0 10/29/2016   BASOPCT 0  10/29/2016   CMP:  Lab Results  Component Value Date   NA 138 11/08/2016   K 3.7 11/08/2016   CL 105 11/08/2016   CO2 27 11/08/2016   BUN 16 11/08/2016   CREATININE 1.13 11/08/2016   PROT 6.4 (L) 10/30/2016   ALBUMIN 3.1 (L) 10/30/2016   BILITOT 1.0 10/30/2016   ALKPHOS 56 10/30/2016   AST 58 (H) 10/30/2016   ALT 54 10/30/2016  .  Micro Results Recent Results (from the past 240 hour(s))  Gastrointestinal Panel by PCR , Stool     Status: Abnormal   Collection Time: 11/03/16 10:04 AM  Result Value Ref Range Status   Campylobacter species NOT DETECTED NOT DETECTED Final   Plesimonas shigelloides NOT DETECTED NOT DETECTED Final   Salmonella species NOT DETECTED NOT DETECTED Final   Yersinia enterocolitica NOT DETECTED NOT DETECTED Final   Vibrio species NOT DETECTED NOT DETECTED Final   Vibrio cholerae NOT DETECTED NOT DETECTED Final   Enteroaggregative E coli (EAEC) NOT DETECTED NOT DETECTED Final   Enteropathogenic E coli (EPEC) DETECTED (A) NOT DETECTED Final    Comment: RESULT CALLED TO, READ BACK BY AND VERIFIED WITH: SUSANA PATINO 11/03/16 @ 1411  MLK    Enterotoxigenic E coli (ETEC) NOT DETECTED NOT DETECTED Final   Shiga like toxin producing E coli (STEC) NOT DETECTED NOT DETECTED Final   Shigella/Enteroinvasive E coli (EIEC) NOT DETECTED NOT DETECTED Final   Cryptosporidium NOT DETECTED NOT DETECTED Final   Cyclospora cayetanensis NOT DETECTED NOT DETECTED Final   Entamoeba histolytica NOT DETECTED NOT DETECTED Final   Giardia lamblia NOT DETECTED NOT DETECTED Final   Adenovirus F40/41 NOT DETECTED NOT DETECTED Final   Astrovirus NOT DETECTED NOT DETECTED Final   Norovirus GI/GII NOT DETECTED NOT DETECTED Final   Rotavirus A NOT DETECTED NOT DETECTED Final   Sapovirus (I, II, IV, and V) NOT DETECTED NOT DETECTED Final  C difficile quick scan w PCR reflex     Status: Abnormal   Collection Time: 11/03/16 10:04 AM  Result Value Ref Range Status   C Diff antigen  POSITIVE (A) NEGATIVE Final   C Diff toxin NEGATIVE NEGATIVE Final   C Diff interpretation Results are indeterminate. See PCR results.  Final  Clostridium Difficile by PCR     Status: Abnormal   Collection Time: 11/03/16 10:04 AM  Result Value Ref Range Status   Toxigenic C Difficile by pcr POSITIVE (A) NEGATIVE Final    Comment: Positive for toxigenic C. difficile with little to no toxin production. Only treat if clinical presentation suggests symptomatic illness.        Code Status Orders        Start     Ordered   10/29/16 1526  Full code  Continuous     10/29/16 1525    Code Status History    Date Active Date Inactive Code Status Order ID Comments User Context   This patient has a current code status but no historical code status.           Contact information for follow-up providers    Leonie Green, MD Follow up in 1 week(s).  Specialty:  Surgery Why:  abdominal infection, back infection Contact information: [place] [address] [phone]        [name], MD Follow up in 1 week(s).   Specialty:  Internal Medicine Contact information: [place] [address] [phone]        Leonel Ramsay, MD Follow up in 10 day(s).   Specialty:  Infectious Diseases Why:  abdominal and back infection Contact information: [place] [address] [phone]        Hollice Espy, MD Follow up in 2 week(s).   Specialty:  Urology Why:  recent renal stone s/p stent Contact information: [place] [address] [phone]            Contact information for after-discharge care    Destination    [facility] .   Specialty:  [place] information: [address] [place] [phone]                  Discharge  Medications   Allergies as of 11/09/2016   No Known Allergies     Medication List    STOP taking these medications   amoxicillin-clavulanate 875-125 MG tablet Commonly known as:  AUGMENTIN   HYDROcodone-acetaminophen 5-325 MG tablet Commonly known as:  NORCO/VICODIN     TAKE these medications   acetaminophen 325 MG tablet Commonly known as:  TYLENOL Take 650 mg by mouth every 4 (four) hours as needed for pain.   cefTRIAXone 1 g in dextrose 5 % 50 mL Inject 1 g into the vein daily.   metroNIDAZOLE 500 MG tablet Commonly known as:  FLAGYL Take 1 tablet (500 mg total) by mouth 3 (three) times daily.   ondansetron 4 MG tablet Commonly known as:  ZOFRAN Take 4 mg by mouth every 8 (eight) hours as needed for nausea.   traMADol 50 MG tablet Commonly known as:  ULTRAM Take 1 tablet (50 mg total) by mouth every 6 (six) hours as needed. What changed:  reasons to take this   vancomycin 50 mg/mL oral solution Commonly known as:  VANCOCIN Take 2.5 mLs (125 mg total) by mouth every 6 (six) hours.            Durable Medical Equipment        Start     Ordered   11/09/16 1220  For home use only DME Bedside commode  Once    Question:  Patient needs a bedside commode to treat with the following condition  Answer:  Unstable gait   11/09/16 1220   11/08/16 1020  For home use only DME Bedside commode  Once    Question:  Patient needs a bedside commode to treat with the following condition  Answer:  Gait abnormality   11/08/16 1019         Total Time in preparing paper work, data evaluation and todays exam - 35 minutes  Dustin Flock M.D on 11/09/2016 at 12:33 PM  [facility]   Office  [phone]

## 2016-11-09 NOTE — Care Management Important Message (Signed)
Important Message  Patient Details  Name: William Kennedy MRN: [id number] Date of Birth: 03-11-1941   Medicare Important Message Given:  Yes    Jolly Mango, RN 11/09/2016, 1:56 PM

## 2016-11-17 ENCOUNTER — Ambulatory Visit: Admission: RE | Admit: 2016-11-17 | Payer: BLUE CROSS/BLUE SHIELD | Source: Ambulatory Visit

## 2016-11-17 LAB — STONE ANALYSIS
Ca Oxalate,Dihydrate: 5 %
Ca Oxalate,Monohydr.: 92 %
Ca phos cry stone ql IR: 3 %
Stone Weight KSTONE: 22 mg

## 2016-11-19 ENCOUNTER — Ambulatory Visit: Payer: BLUE CROSS/BLUE SHIELD

## 2016-11-22 ENCOUNTER — Ambulatory Visit: Payer: BLUE CROSS/BLUE SHIELD | Admitting: Urology

## 2016-11-26 ENCOUNTER — Ambulatory Visit: Admission: RE | Admit: 2016-11-26 | Payer: BLUE CROSS/BLUE SHIELD | Source: Ambulatory Visit

## 2016-11-30 ENCOUNTER — Emergency Department: Payer: BLUE CROSS/BLUE SHIELD

## 2016-11-30 ENCOUNTER — Encounter: Payer: Self-pay | Admitting: Emergency Medicine

## 2016-11-30 ENCOUNTER — Inpatient Hospital Stay
Admission: EM | Admit: 2016-11-30 | Discharge: 2016-12-06 | DRG: 871 | Disposition: A | Discharge status: Home Health | Payer: BLUE CROSS/BLUE SHIELD | Attending: Internal Medicine | Admitting: Internal Medicine

## 2016-11-30 DIAGNOSIS — L02212 Cutaneous abscess of back [any part, except buttock]: Secondary | ICD-10-CM | POA: Diagnosis present

## 2016-11-30 DIAGNOSIS — R410 Disorientation, unspecified: Secondary | ICD-10-CM | POA: Diagnosis not present

## 2016-11-30 DIAGNOSIS — R3129 Other microscopic hematuria: Secondary | ICD-10-CM

## 2016-11-30 DIAGNOSIS — N179 Acute kidney failure, unspecified: Secondary | ICD-10-CM | POA: Diagnosis present

## 2016-11-30 DIAGNOSIS — N200 Calculus of kidney: Secondary | ICD-10-CM

## 2016-11-30 DIAGNOSIS — K5792 Diverticulitis of intestine, part unspecified, without perforation or abscess without bleeding: Secondary | ICD-10-CM

## 2016-11-30 DIAGNOSIS — N1330 Unspecified hydronephrosis: Secondary | ICD-10-CM | POA: Diagnosis present

## 2016-11-30 DIAGNOSIS — I10 Essential (primary) hypertension: Secondary | ICD-10-CM | POA: Diagnosis present

## 2016-11-30 DIAGNOSIS — E669 Obesity, unspecified: Secondary | ICD-10-CM | POA: Diagnosis present

## 2016-11-30 DIAGNOSIS — L02213 Cutaneous abscess of chest wall: Secondary | ICD-10-CM

## 2016-11-30 DIAGNOSIS — G92 Toxic encephalopathy: Secondary | ICD-10-CM | POA: Diagnosis present

## 2016-11-30 DIAGNOSIS — Z6830 Body mass index (BMI) 30.0-30.9, adult: Secondary | ICD-10-CM | POA: Diagnosis not present

## 2016-11-30 DIAGNOSIS — Z87891 Personal history of nicotine dependence: Secondary | ICD-10-CM | POA: Diagnosis not present

## 2016-11-30 DIAGNOSIS — R4182 Altered mental status, unspecified: Secondary | ICD-10-CM

## 2016-11-30 DIAGNOSIS — R338 Other retention of urine: Secondary | ICD-10-CM | POA: Diagnosis present

## 2016-11-30 DIAGNOSIS — C9110 Chronic lymphocytic leukemia of B-cell type not having achieved remission: Secondary | ICD-10-CM | POA: Diagnosis present

## 2016-11-30 DIAGNOSIS — Z79899 Other long term (current) drug therapy: Secondary | ICD-10-CM | POA: Diagnosis not present

## 2016-11-30 DIAGNOSIS — N320 Bladder-neck obstruction: Secondary | ICD-10-CM | POA: Diagnosis present

## 2016-11-30 DIAGNOSIS — N401 Enlarged prostate with lower urinary tract symptoms: Secondary | ICD-10-CM | POA: Diagnosis present

## 2016-11-30 DIAGNOSIS — R339 Retention of urine, unspecified: Secondary | ICD-10-CM | POA: Diagnosis not present

## 2016-11-30 DIAGNOSIS — E876 Hypokalemia: Secondary | ICD-10-CM | POA: Diagnosis present

## 2016-11-30 DIAGNOSIS — L03312 Cellulitis of back [any part except buttock]: Secondary | ICD-10-CM | POA: Diagnosis present

## 2016-11-30 DIAGNOSIS — K5720 Diverticulitis of large intestine with perforation and abscess without bleeding: Secondary | ICD-10-CM | POA: Diagnosis present

## 2016-11-30 DIAGNOSIS — A419 Sepsis, unspecified organism: Principal | ICD-10-CM

## 2016-11-30 DIAGNOSIS — R451 Restlessness and agitation: Secondary | ICD-10-CM | POA: Diagnosis present

## 2016-11-30 DIAGNOSIS — R6520 Severe sepsis without septic shock: Secondary | ICD-10-CM | POA: Diagnosis present

## 2016-11-30 DIAGNOSIS — G9340 Encephalopathy, unspecified: Secondary | ICD-10-CM

## 2016-11-30 HISTORY — DX: Cutaneous abscess, unspecified: L02.91

## 2016-11-30 LAB — CBC WITH DIFFERENTIAL/PLATELET
BASOS PCT: 1 %
Basophils Absolute: 0.2 10*3/uL — ABNORMAL HIGH (ref 0–0.1)
EOS ABS: 0.3 10*3/uL (ref 0–0.7)
Eosinophils Relative: 1 %
HCT: 47 % (ref 40.0–52.0)
HEMOGLOBIN: 15.8 g/dL (ref 13.0–18.0)
Lymphocytes Relative: 45 %
Lymphs Abs: 12.5 10*3/uL — ABNORMAL HIGH (ref 1.0–3.6)
MCH: 30.8 pg (ref 26.0–34.0)
MCHC: 33.7 g/dL (ref 32.0–36.0)
MCV: 91.5 fL (ref 80.0–100.0)
Monocytes Absolute: 1.6 10*3/uL — ABNORMAL HIGH (ref 0.2–1.0)
Monocytes Relative: 6 %
NEUTROS PCT: 47 %
Neutro Abs: 13.3 10*3/uL — ABNORMAL HIGH (ref 1.4–6.5)
Platelets: 346 10*3/uL (ref 150–440)
RBC: 5.14 MIL/uL (ref 4.40–5.90)
RDW: 13.6 % (ref 11.5–14.5)
WBC: 27.8 10*3/uL — AB (ref 3.8–10.6)

## 2016-11-30 LAB — COMPREHENSIVE METABOLIC PANEL
ALT: 53 U/L (ref 17–63)
ANION GAP: 15 (ref 5–15)
AST: 77 U/L — ABNORMAL HIGH (ref 15–41)
Albumin: 3.4 g/dL — ABNORMAL LOW (ref 3.5–5.0)
Alkaline Phosphatase: 133 U/L — ABNORMAL HIGH (ref 38–126)
BUN: 12 mg/dL (ref 6–20)
CALCIUM: 9.8 mg/dL (ref 8.9–10.3)
CO2: 24 mmol/L (ref 22–32)
Chloride: 94 mmol/L — ABNORMAL LOW (ref 101–111)
Creatinine, Ser: 1.72 mg/dL — ABNORMAL HIGH (ref 0.61–1.24)
GFR calc non Af Amer: 37 mL/min — ABNORMAL LOW (ref >60)
GFR, EST AFRICAN AMERICAN: 43 mL/min — AB (ref >60)
Glucose, Bld: 119 mg/dL — ABNORMAL HIGH (ref 65–99)
POTASSIUM: 4.5 mmol/L (ref 3.5–5.1)
SODIUM: 133 mmol/L — AB (ref 135–145)
Total Bilirubin: 1 mg/dL (ref 0.3–1.2)
Total Protein: 6.7 g/dL (ref 6.5–8.1)

## 2016-11-30 LAB — URINALYSIS, COMPLETE (UACMP) WITH MICROSCOPIC
BILIRUBIN URINE: NEGATIVE
Bacteria, UA: NONE SEEN
Glucose, UA: NEGATIVE mg/dL
Ketones, ur: NEGATIVE mg/dL
Leukocytes, UA: NEGATIVE
NITRITE: NEGATIVE
PROTEIN: NEGATIVE mg/dL
Specific Gravity, Urine: 1.032 — ABNORMAL HIGH (ref 1.005–1.030)
pH: 7 (ref 5.0–8.0)

## 2016-11-30 LAB — LACTIC ACID, PLASMA
LACTIC ACID, VENOUS: 4.1 mmol/L — AB (ref 0.5–1.9)
LACTIC ACID, VENOUS: 2.1 mmol/L — AB (ref 0.5–1.9)

## 2016-11-30 LAB — TROPONIN I: Troponin I: <0.03 ng/mL (ref <0.03)

## 2016-11-30 MED ORDER — ACETAMINOPHEN 325 MG PO TABS
650.0000 mg | ORAL_TABLET | Freq: Four times a day (QID) | ORAL | Status: DC | PRN
  Administered 2016-12-05: 650 mg via ORAL
  Filled 2016-11-30: qty 2

## 2016-11-30 MED ORDER — ENOXAPARIN SODIUM 40 MG/0.4ML ~~LOC~~ SOLN
40.0000 mg | SUBCUTANEOUS | Status: DC
  Administered 2016-11-30 – 2016-12-06 (×6): 40 mg via SUBCUTANEOUS
  Filled 2016-11-30 (×7): qty 0.4

## 2016-11-30 MED ORDER — HYDROCODONE-ACETAMINOPHEN 5-325 MG PO TABS
1.0000 | ORAL_TABLET | ORAL | Status: DC | PRN
  Administered 2016-12-02 – 2016-12-05 (×5): 1 via ORAL
  Filled 2016-11-30 (×5): qty 1

## 2016-11-30 MED ORDER — VANCOMYCIN HCL 10 G IV SOLR
1500.0000 mg | Freq: Two times a day (BID) | INTRAVENOUS | Status: DC
  Administered 2016-11-30 – 2016-12-01 (×2): 1500 mg via INTRAVENOUS
  Filled 2016-11-30 (×3): qty 1500

## 2016-11-30 MED ORDER — SODIUM CHLORIDE 0.9 % IV BOLUS (SEPSIS)
1000.0000 mL | Freq: Once | INTRAVENOUS | Status: AC
  Administered 2016-11-30: 1000 mL via INTRAVENOUS

## 2016-11-30 MED ORDER — ONDANSETRON HCL 4 MG PO TABS: 4.0000 mg | ORAL_TABLET | Freq: Four times a day (QID) | ORAL | Status: DC | PRN

## 2016-11-30 MED ORDER — ACETAMINOPHEN 650 MG RE SUPP: 650.0000 mg | Freq: Four times a day (QID) | RECTAL | Status: DC | PRN

## 2016-11-30 MED ORDER — FENTANYL CITRATE (PF) 100 MCG/2ML IJ SOLN
INTRAMUSCULAR | Status: AC
  Filled 2016-11-30: qty 2

## 2016-11-30 MED ORDER — PIPERACILLIN-TAZOBACTAM 3.375 G IVPB
3.3750 g | Freq: Three times a day (TID) | INTRAVENOUS | Status: DC
  Administered 2016-11-30 – 2016-12-06 (×19): 3.375 g via INTRAVENOUS
  Filled 2016-11-30 (×17): qty 50

## 2016-11-30 MED ORDER — POLYETHYLENE GLYCOL 3350 17 G PO PACK: 17.0000 g | PACK | Freq: Every day | ORAL | Status: DC | PRN

## 2016-11-30 MED ORDER — LORAZEPAM 2 MG/ML IJ SOLN
0.5000 mg | Freq: Four times a day (QID) | INTRAMUSCULAR | Status: DC | PRN
  Administered 2016-11-30 – 2016-12-04 (×3): 0.5 mg via INTRAVENOUS
  Filled 2016-11-30 (×3): qty 1

## 2016-11-30 MED ORDER — SODIUM CHLORIDE 0.9 % IV BOLUS (SEPSIS)
1000.0000 mL | Freq: Once | INTRAVENOUS | Status: AC
Start: 2016-11-30 — End: 2016-11-30
  Administered 2016-11-30: 1000 mL via INTRAVENOUS

## 2016-11-30 MED ORDER — HYDRALAZINE HCL 20 MG/ML IJ SOLN: 10.0000 mg | Freq: Four times a day (QID) | INTRAMUSCULAR | Status: DC | PRN

## 2016-11-30 MED ORDER — TAMSULOSIN HCL 0.4 MG PO CAPS
0.4000 mg | ORAL_CAPSULE | Freq: Every day | ORAL | Status: DC
  Administered 2016-12-01 – 2016-12-06 (×6): 0.4 mg via ORAL
  Filled 2016-11-30 (×6): qty 1

## 2016-11-30 MED ORDER — PIPERACILLIN-TAZOBACTAM 3.375 G IVPB 30 MIN
3.3750 g | Freq: Once | INTRAVENOUS | Status: AC
  Administered 2016-11-30: 3.375 g via INTRAVENOUS
  Filled 2016-11-30: qty 50

## 2016-11-30 MED ORDER — ONDANSETRON HCL 4 MG/2ML IJ SOLN: 4.0000 mg | Freq: Four times a day (QID) | INTRAMUSCULAR | Status: DC | PRN

## 2016-11-30 MED ORDER — VANCOMYCIN HCL 10 G IV SOLR: 1500.0000 mg | Freq: Two times a day (BID) | INTRAVENOUS | Status: DC

## 2016-11-30 MED ORDER — ALBUTEROL SULFATE (2.5 MG/3ML) 0.083% IN NEBU: 2.5000 mg | INHALATION_SOLUTION | RESPIRATORY_TRACT | Status: DC | PRN

## 2016-11-30 MED ORDER — FENTANYL CITRATE (PF) 100 MCG/2ML IJ SOLN
100.0000 ug | INTRAMUSCULAR | Status: DC | PRN
  Administered 2016-11-30 (×2): 100 ug via INTRAVENOUS
  Filled 2016-11-30: qty 2

## 2016-11-30 MED ORDER — VANCOMYCIN HCL IN DEXTROSE 1-5 GM/200ML-% IV SOLN
1000.0000 mg | Freq: Once | INTRAVENOUS | Status: AC
  Administered 2016-11-30: 1000 mg via INTRAVENOUS
  Filled 2016-11-30: qty 200

## 2016-11-30 MED ORDER — SODIUM CHLORIDE 0.9 % IV SOLN
INTRAVENOUS | Status: DC
  Administered 2016-11-30 – 2016-12-03 (×5): via INTRAVENOUS

## 2016-11-30 MED ORDER — IOPAMIDOL (ISOVUE-300) INJECTION 61%
75.0000 mL | Freq: Once | INTRAVENOUS | Status: AC | PRN
  Administered 2016-11-30: 75 mL via INTRAVENOUS

## 2016-11-30 MED ORDER — VANCOMYCIN 50 MG/ML ORAL SOLUTION
500.0000 mg | Freq: Four times a day (QID) | ORAL | Status: DC
  Administered 2016-11-30 – 2016-12-06 (×22): 500 mg via ORAL
  Filled 2016-11-30 (×28): qty 10

## 2016-11-30 MED ORDER — MORPHINE SULFATE (PF) 2 MG/ML IV SOLN
2.0000 mg | INTRAVENOUS | Status: DC | PRN
  Administered 2016-11-30 – 2016-12-04 (×6): 2 mg via INTRAVENOUS
  Filled 2016-11-30 (×6): qty 1

## 2016-11-30 NOTE — H&P (Addendum)
[facility] [place] NAME: William Kennedy    MR#:  [id number]  DATE OF BIRTH:  Feb 18, 1941  DATE OF ADMISSION:  11/30/2016  PRIMARY CARE PHYSICIAN: [name], MD   REQUESTING/REFERRING PHYSICIAN: Dr. Quentin Cornwall  CHIEF COMPLAINT:   Chief Complaint  Patient presents with  . Hypotension    HISTORY OF PRESENT ILLNESS:  William Kennedy  is a 76 y.o. male with a known history of Upper back abscess with MSSA, BPH, hypertension who was recently in the hospital for sigmoid diverticulitis with surrounding abscess from perforation, upper back abscess was discharged home on IV ceftriaxone with the PICC line in place. Patient has been noticed to be more confused complaining of pain in his abdomen and was taken to his primary care physician Dr. [name]. Here he was found to have blood pressure low at 62/17. Sent to the emergency room. Here his blood pressure is elevated but found to be tachycardic with lactic acid of 4.1. 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  Continues to have purulent drainage and surrounding erythema and upper back abscess which had incision and drainage recently.  Found to have urinary retention and Foley catheter placed in the ER. No UTI.  CT scan of the abdomen shows sigmoid diverticulitis with improvement from last admission. Continues to have diarrhea with incontinence. Afebrile.  Receiving IV vancomycin and Zosyn in the ER.  PAST MEDICAL HISTORY:   Past Medical History:  Diagnosis Date  . Abscess    shoulder  . Leukemia ([place])     PAST SURGICAL HISTORY:   Past Surgical History:  Procedure Laterality Date  . BACK SURGERY    . CYSTOSCOPY WITH URETEROSCOPY AND STENT PLACEMENT Left 11/06/2016   Procedure: CYSTOSCOPY WITH URETEROSCOPY AND STENT PLACEMENT retrograde pyelogram,holmium laser;  Surgeon: Irine Seal, MD;  Location: [facility] ORS;  Service: Urology;  Laterality: Left;  . EXTRACORPOREAL SHOCK WAVE LITHOTRIPSY Left  11/04/2016   Procedure: EXTRACORPOREAL SHOCK WAVE LITHOTRIPSY (ESWL);  Surgeon: Hollice Espy, MD;  Location: [facility] ORS;  Service: Urology;  Laterality: Left;    SOCIAL HISTORY:   Social History  Substance Use Topics  . Smoking status: Former Research scientist (life sciences)  . Smokeless tobacco: Former Systems developer  . Alcohol use No    FAMILY HISTORY:   Family History  Problem Relation Age of Onset  . Diabetes Mother   . Cancer Father     DRUG ALLERGIES:  No Known Allergies  REVIEW OF SYSTEMS:   Review of Systems  Unable to perform ROS: Mental status change    MEDICATIONS AT HOME:   Prior to Admission medications   Medication Sig Start Date End Date Taking? Authorizing Provider  cefTRIAXone 1 g in dextrose 5 % 50 mL Inject 1 g into the vein daily. 11/09/16  Yes Dustin Flock, MD  acetaminophen (TYLENOL) 325 MG tablet Take 650 mg by mouth every 4 (four) hours as needed for pain.    [provider]  ondansetron (ZOFRAN) 4 MG tablet Take 4 mg by mouth every 8 (eight) hours as needed for nausea. 10/28/16   [provider]  traMADol (ULTRAM) 50 MG tablet Take 1 tablet (50 mg total) by mouth every 6 (six) hours as needed. 11/09/16   Dustin Flock, MD     VITAL SIGNS:  Blood pressure (!) 131/102, pulse (!) 108, temperature 98.3 F (36.8 C), resp. rate (!) 22, height 6\' 2"  (1.88 m), weight 122.5 kg (270 lb), SpO2 100 %.  PHYSICAL EXAMINATION:  Physical Exam  GENERAL:  76 y.o.-year-old patient lying in the bed , In distress due to pain EYES: Pupils equal, round, reactive to light and accommodation. No scleral icterus. Extraocular muscles intact.  HEENT: Head atraumatic, normocephalic. Oropharynx and nasopharynx clear. No oropharyngeal erythema, moist oral mucosa  NECK:  Supple, no jugular venous distention. No thyroid enlargement, no tenderness.  LUNGS: Normal breath sounds bilaterally, no wheezing, rales, rhonchi. No use of accessory muscles of respiration.  CARDIOVASCULAR: S1, S2  normal. No murmurs, rubs, or gallops.  ABDOMEN: Soft, tenderness diffusely. No rigidity or guarding. Foley catheter in place with dark urine EXTREMITIES: No pedal edema, cyanosis, or clubbing. + 2 pedal & radial pulses b/l.   NEUROLOGIC: Moves all 4 extremities but not following instructions PSYCHIATRIC: The patient is confused and restless in bed. Awake. Left upper extremity PICC line with no surrounding erythema or discharge or swelling  LABORATORY PANEL:   CBC  Recent Labs Lab 11/30/16 1424  WBC 27.8*  HGB 15.8  HCT 47.0  PLT 346   ------------------------------------------------------------------------------------------------------------------  Chemistries   Recent Labs Lab 11/30/16 1424  NA 133*  K 4.5  CL 94*  CO2 24  GLUCOSE 119*  BUN 12  CREATININE 1.72*  CALCIUM 9.8  AST 77*  ALT 53  ALKPHOS 133*  BILITOT 1.0   ------------------------------------------------------------------------------------------------------------------  Cardiac Enzymes  Recent Labs Lab 11/30/16 1424  TROPONINI <0.03   ------------------------------------------------------------------------------------------------------------------  RADIOLOGY:  Ct Head Wo Contrast  Result Date: 11/30/2016 CLINICAL DATA:  Altered mental status. EXAM: CT HEAD WITHOUT CONTRAST TECHNIQUE: Contiguous axial images were obtained from the base of the skull through the vertex without intravenous contrast. COMPARISON:  None. FINDINGS: Brain: Mild diffuse cortical atrophy is noted. Mild chronic ischemic white matter disease is noted. No mass effect or midline shift is noted. Ventricular size is within normal limits. There is no evidence of mass lesion, hemorrhage or acute infarction. Vascular: No hyperdense vessel or unexpected calcification. Skull: Normal. Negative for fracture or focal lesion. Sinuses/Orbits: No acute finding. Other: None. IMPRESSION: Mild diffuse cortical atrophy. Mild chronic ischemic white  matter disease. No acute intracranial abnormality seen. Electronically Signed   By: Marijo Conception, M.D.   On: 11/30/2016 16:21   Ct Chest W Contrast  Result Date: 11/30/2016 CLINICAL DATA:  Abdominal pain and hypotension. EXAM: CT CHEST, ABDOMEN, AND PELVIS WITH CONTRAST TECHNIQUE: Multidetector CT imaging of the chest, abdomen and pelvis was performed following the standard protocol during bolus administration of intravenous contrast. CONTRAST:  75mL ISOVUE-300 IOPAMIDOL (ISOVUE-300) INJECTION 61% COMPARISON:  CT scan 11/08/2016 FINDINGS: CT CHEST FINDINGS Cardiovascular: The heart is normal in size. No pericardial effusion. There is tortuosity and calcification of the thoracic aorta. The branch vessels are patent. The advanced three-vessel coronary artery calcifications are noted. Mediastinum/Nodes: Small scattered mediastinal and hilar lymph nodes but no mass or overt adenopathy. The esophagus is grossly normal. Lungs/Pleura: Advanced emphysematous changes and pulmonary scarring. Significant breathing motion artifact no obvious pulmonary lesions. No acute pulmonary findings such as infiltrate, edema or effusion. No pneumothorax. The tracheobronchial tree is grossly normal. Chest wall/ Musculoskeletal: No chest wall mass, supraclavicular or axillary lymphadenopathy. A few small scattered lymph nodes are noted. The thyroid gland appears normal. Small amount of injected air is noted in the left subclavian vein and right internal jugular vein. No significant bony findings. CT ABDOMEN PELVIS FINDINGS Hepatobiliary: No focal hepatic lesions or intrahepatic biliary dilatation. Gallbladder is normal. No common bile duct dilatation. Pancreas: No mass, inflammation or ductal  dilatation. Spleen: Normal size.  No focal lesions. Adrenals/Urinary Tract: Stable large rim calcified right adrenal gland cyst. The left adrenal gland is unremarkable. Stable scarring changes involving the left kidney. A small left renal  calculus is noted. The left-sided hydroureteronephrosis, seen on the prior study, has resolved. Stable large right renal cyst. No right-sided renal or ureteral calculi. No bladder calculi. Moderate symmetric bladder wall thickening likely due to bladder outlet obstruction from a markedly enlarged prostate gland. Stomach/Bowel: The stomach, duodenum and small bowel are unremarkable. The terminal ileum is normal. The appendix is normal. Diffuse descending and sigmoid diverticulosis with acute diverticulitis involving the sigmoid colon. There is a large inflamed diverticuli also seen on the prior study with some surrounding inflammation. No complicating features such as free air or abscess. Vascular/Lymphatic: Stable advanced atherosclerotic calcifications involving the abdominal aorta and iliac arteries. The branch vessels are patent. Small caliber left renal artery with ostial calcifications bilaterally. New line no mesenteric or retroperitoneal lymphadenopathy. Small scattered lymph nodes are stable. There are also small external iliac lymph nodes which are stable. Reproductive: Markedly enlarged prostate gland. The seminal vesicles appear normal. Other: No free pelvic fluid collection or inguinal adenopathy. Musculoskeletal: No significant bony findings. Advanced degenerative changes involving the lumbar spine. IMPRESSION: 1. Persistent or recurrent sigmoid diverticulitis without complicating features such as abscess or free air. 2. No acute or significant pulmonary findings. 3. Stable large rim calcified right adrenal gland cyst. 4. Resolution of left ureteral calculus. Small left renal calculus noted. 5. Markedly enlarged prostate gland with bladder outlet obstruction and moderate uniform bladder wall thickening. Electronically Signed   By: Marijo Sanes M.D.   On: 11/30/2016 16:29   Ct Abdomen Pelvis W Contrast  Result Date: 11/30/2016 CLINICAL DATA:  Abdominal pain and hypotension. EXAM: CT CHEST, ABDOMEN,  AND PELVIS WITH CONTRAST TECHNIQUE: Multidetector CT imaging of the chest, abdomen and pelvis was performed following the standard protocol during bolus administration of intravenous contrast. CONTRAST:  51mL ISOVUE-300 IOPAMIDOL (ISOVUE-300) INJECTION 61% COMPARISON:  CT scan 11/08/2016 FINDINGS: CT CHEST FINDINGS Cardiovascular: The heart is normal in size. No pericardial effusion. There is tortuosity and calcification of the thoracic aorta. The branch vessels are patent. The advanced three-vessel coronary artery calcifications are noted. Mediastinum/Nodes: Small scattered mediastinal and hilar lymph nodes but no mass or overt adenopathy. The esophagus is grossly normal. Lungs/Pleura: Advanced emphysematous changes and pulmonary scarring. Significant breathing motion artifact no obvious pulmonary lesions. No acute pulmonary findings such as infiltrate, edema or effusion. No pneumothorax. The tracheobronchial tree is grossly normal. Chest wall/ Musculoskeletal: No chest wall mass, supraclavicular or axillary lymphadenopathy. A few small scattered lymph nodes are noted. The thyroid gland appears normal. Small amount of injected air is noted in the left subclavian vein and right internal jugular vein. No significant bony findings. CT ABDOMEN PELVIS FINDINGS Hepatobiliary: No focal hepatic lesions or intrahepatic biliary dilatation. Gallbladder is normal. No common bile duct dilatation. Pancreas: No mass, inflammation or ductal dilatation. Spleen: Normal size.  No focal lesions. Adrenals/Urinary Tract: Stable large rim calcified right adrenal gland cyst. The left adrenal gland is unremarkable. Stable scarring changes involving the left kidney. A small left renal calculus is noted. The left-sided hydroureteronephrosis, seen on the prior study, has resolved. Stable large right renal cyst. No right-sided renal or ureteral calculi. No bladder calculi. Moderate symmetric bladder wall thickening likely due to bladder outlet  obstruction from a markedly enlarged prostate gland. Stomach/Bowel: The stomach, duodenum and small bowel are unremarkable. The  terminal ileum is normal. The appendix is normal. Diffuse descending and sigmoid diverticulosis with acute diverticulitis involving the sigmoid colon. There is a large inflamed diverticuli also seen on the prior study with some surrounding inflammation. No complicating features such as free air or abscess. Vascular/Lymphatic: Stable advanced atherosclerotic calcifications involving the abdominal aorta and iliac arteries. The branch vessels are patent. Small caliber left renal artery with ostial calcifications bilaterally. New line no mesenteric or retroperitoneal lymphadenopathy. Small scattered lymph nodes are stable. There are also small external iliac lymph nodes which are stable. Reproductive: Markedly enlarged prostate gland. The seminal vesicles appear normal. Other: No free pelvic fluid collection or inguinal adenopathy. Musculoskeletal: No significant bony findings. Advanced degenerative changes involving the lumbar spine. IMPRESSION: 1. Persistent or recurrent sigmoid diverticulitis without complicating features such as abscess or free air. 2. No acute or significant pulmonary findings. 3. Stable large rim calcified right adrenal gland cyst. 4. Resolution of left ureteral calculus. Small left renal calculus noted. 5. Markedly enlarged prostate gland with bladder outlet obstruction and moderate uniform bladder wall thickening. Electronically Signed   By: Marijo Sanes M.D.   On: 11/30/2016 16:29   Dg Chest Port 1 View  Result Date: 11/30/2016 CLINICAL DATA:  Pt brought in for hypotension. He has an abcess on the left posterior shoulder and has a picc line. Hx of leukemia. Former smoker EXAM: PORTABLE CHEST 1 VIEW COMPARISON:  Chest x-ray dated 11/05/2016. Chest x-ray dated 04/02/2010. FINDINGS: Right-sided PICC line in place with tip adequately positioned at the level of the  mid/lower SVC. Heart size is upper normal, stable. Overall cardiomediastinal silhouette is stable. Coarse lung markings bilaterally suggests chronic interstitial lung disease. No new lung findings. No pleural effusion or pneumothorax seen. Osseous structures about the chest are unremarkable. Vague lucency within the soft tissues about the left shoulder is presumably related to patient's known soft tissue abscess. IMPRESSION: No active disease.  No evidence of pneumonia or pulmonary edema. Probable chronic interstitial lung disease. Electronically Signed   By: Franki Cabot M.D.   On: 11/30/2016 14:07     IMPRESSION AND PLAN:   * Severe sepsis with encephalopathy. Critically ill. Multiple etiologies are upper back abscess with purulent drainage and surrounding cellulitis and sigmoid diverticulitis. He also has ongoing diarrhea with recent C. difficile infection. Patient is on IV ceftriaxone at home. PICC line in place. Need to rule out bacteremia. Blood cultures sent and pending. Fluid bolus per sepsis protocol and repeat lactic acid. Start broad-spectrum antibiotics with vancomycin and Zosyn while waiting for cultures. Also oral vancomycin. We'll repeat C. difficile stool studies. Consult surgery Dr. Tamala Julian for his upper back abscess. May need repeat I&D. Also consult infectious disease due to sepsis while being on IV antibiotics at home.  * Upper back abscess- see abouve  * Sigmoid diverticulitis with possible C. difficile. See above.  * Acute toxic encephalopathy due to sepsis. CT scan of the head negative. Should improve as his infection improves.  * Acute kidney injury due to severe sepsis and bladder outlet obstruction. Should improve with IV fluids. Monitor input and output. Repeat labs in the morning.  * CLL with chronic leukocytosis.  * Hypertension. 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 No UTI. Start Flomax. Foley catheter placed and will  continue. No hydronephrosis.  * DVT prophylaxis with Lovenox  All the records are reviewed and case discussed with ED provider. Management plans discussed with the patient, family and they are  in agreement.  CODE STATUS: FULL CODE  TOTAL CC TIME TAKING CARE OF THIS PATIENT: 40 minutes.   Hillary Bow R M.D on 11/30/2016 at 6:33 PM  Between 7am to 6pm - Pager - [phone]  After 6pm go to [email or web address] - password [email or web address] [facility]  Office  [phone]  CC: Primary care physician; [name], MD  Note: This dictation was prepared with Dragon dictation along with smaller phrase technology. Any transcriptional errors that result from this process are unintentional.

## 2016-11-30 NOTE — Progress Notes (Signed)
Pharmacy Antibiotic Note  William Kennedy is a 76 y.o. male admitted on 11/30/2016 with abscess/cellulits.  Pharmacy has been consulted for vancomcyin/Zosyn dosing. Patient received vancomycin and Zosyn x 1 in ED.   Plan: Continue Zosyn EI 3.375g IV Q8hr.   Will initiate vancomycin 1500mg  IV Q12hr for goal trough of 15-20. Next schedule dose will be at 1900 on 7/10. Will obtain trough prior to 5th dose.   Height: 6\' 2"  (188 cm) Weight: 270 lb (122.5 kg) IBW/kg (Calculated) : 82.2  Temp (24hrs), Avg:98.3 F (36.8 C), Min:98.3 F (36.8 C), Max:98.3 F (36.8 C)   Recent Labs Lab 11/30/16 1424  WBC 27.8*  CREATININE 1.72*  LATICACIDVEN 4.1*    Estimated Creatinine Clearance: 51.6 mL/min (A) (by C-G formula based on SCr of 1.72 mg/dL (H)).    No Known Allergies  Antimicrobials this admission: Zosyn 7/10 >>  Vancomycin 7/10 >>   Dose adjustments this admission: N/A  Microbiology results: 7/10 BCx: pending  7/10 UCx: pending   Thank you for allowing pharmacy to be a part of this patient's care.  Arris Meyn L 11/30/2016 4:48 PM

## 2016-11-30 NOTE — ED Notes (Signed)
Admitting MD at bedside.

## 2016-11-30 NOTE — ED Notes (Signed)
Code Sepsis cancelled by ordering Md Quentin Cornwall @[phone] 

## 2016-11-30 NOTE — ED Notes (Signed)
Pts gown, linens and chucks changed.

## 2016-11-30 NOTE — ED Triage Notes (Signed)
Brought from [facility] for hypotension.  Being treasted  For abscess?  With antibiotics.  picc in right arm

## 2016-11-30 NOTE — ED Provider Notes (Signed)
[facility] Emergency Department Provider Note    First MD Initiated Contact with Patient 11/30/16 1420     (approximate)  I have reviewed the triage vital signs and the nursing notes.   HISTORY  Chief Complaint Hypotension    HPI William Kennedy is a 76 y.o. male who has been on chronic home steroids through PICC line for complex recent medical processes. Patient was diagnosed with left posterior thorax abscess and cellulitis. Patient is also had evidence of kidney stone that required lithotripsy. Patient also with evidence of diverticulitis with perforation. She presents to the ER today due to concern for low blood pressure at home as well as worsening confusion. No measured fevers. States that he hurts all over. Has been having myalgias for the past day. No nausea or vomiting. No chest pain.   Past Medical History:  Diagnosis Date  . Abscess    shoulder  . Leukemia ([place])    Family History  Problem Relation Age of Onset  . Diabetes Mother   . Cancer Father    Past Surgical History:  Procedure Laterality Date  . BACK SURGERY    . CYSTOSCOPY WITH URETEROSCOPY AND STENT PLACEMENT Left 11/06/2016   Procedure: CYSTOSCOPY WITH URETEROSCOPY AND STENT PLACEMENT retrograde pyelogram,holmium laser;  Surgeon: Irine Seal, MD;  Location: [facility] ORS;  Service: Urology;  Laterality: Left;  . EXTRACORPOREAL SHOCK WAVE LITHOTRIPSY Left 11/04/2016   Procedure: EXTRACORPOREAL SHOCK WAVE LITHOTRIPSY (ESWL);  Surgeon: Hollice Espy, MD;  Location: [facility] ORS;  Service: Urology;  Laterality: Left;   Patient Active Problem List   Diagnosis Date Noted  . Sepsis ([place]) 11/30/2016  . Left ureteral stone   . Cellulitis and abscess of trunk 10/29/2016      Prior to Admission medications   Medication Sig Start Date End Date Taking? Authorizing Provider  cefTRIAXone 1 g in dextrose 5 % 50 mL Inject 1 g into the vein daily. 11/09/16  Yes Dustin Flock, MD  acetaminophen  (TYLENOL) 325 MG tablet Take 650 mg by mouth every 4 (four) hours as needed for pain.    [provider]  ondansetron (ZOFRAN) 4 MG tablet Take 4 mg by mouth every 8 (eight) hours as needed for nausea. 10/28/16   [provider]  traMADol (ULTRAM) 50 MG tablet Take 1 tablet (50 mg total) by mouth every 6 (six) hours as needed. 11/09/16   Dustin Flock, MD    Allergies Patient has no known allergies.    Social History Social History  Substance Use Topics  . Smoking status: Former Research scientist (life sciences)  . Smokeless tobacco: Former Systems developer  . Alcohol use No    Review of Systems Patient denies headaches, rhinorrhea, blurry vision, numbness, shortness of breath, chest pain, edema, cough, abdominal pain, nausea, vomiting, diarrhea, dysuria, fevers, rashes or hallucinations unless otherwise stated above in HPI. ____________________________________________   PHYSICAL EXAM:  VITAL SIGNS: Vitals:   11/30/16 1745 11/30/16 1859  BP: (!) 131/102 126/89  Pulse: (!) 108 97  Resp: (!) 22 20  Temp:      Constitutional: Alert, confused, ill appearing but in no acute distress. Eyes: Conjunctivae are normal.  Head: Atraumatic. Nose: No congestion/rhinnorhea. Mouth/Throat: Mucous membranes are moist.   Neck: No stridor. Painless ROM.  Cardiovascular: tachycardic, regular rhythm. Grossly normal heart sounds.  Good peripheral circulation. Respiratory: Normal respiratory effort.  No retractions. Lungs CTAB. Gastrointestinal: Soft and nontender. No distention. No abdominal bruits. No CVA tenderness. Musculoskeletal: No lower extremity tenderness nor edema.  No joint effusions.  Left posterior thorax pain and cellulitis with purulent drainage from pinrose drain Neurologic:  N No gross focal neurologic deficits are appreciated. No facial droop Skin:  Skin is warm, dry and intact. Cellulitis as described above Psychiatric: Mood and affect are normal. Speech and behavior are  normal.  ____________________________________________   LABS (all labs ordered are listed, but only abnormal results are displayed)  Results for orders placed or performed during the hospital encounter of 11/30/16 (from the past 24 hour(s))  Lactic acid, plasma     Status: Abnormal   Collection Time: 11/30/16  2:24 PM  Result Value Ref Range   Lactic Acid, Venous 4.1 (HH) 0.5 - 1.9 mmol/L  Comprehensive metabolic panel     Status: Abnormal   Collection Time: 11/30/16  2:24 PM  Result Value Ref Range   Sodium 133 (L) 135 - 145 mmol/L   Potassium 4.5 3.5 - 5.1 mmol/L   Chloride 94 (L) 101 - 111 mmol/L   CO2 24 22 - 32 mmol/L   Glucose, Bld 119 (H) 65 - 99 mg/dL   BUN 12 6 - 20 mg/dL   Creatinine, Ser 1.72 (H) 0.61 - 1.24 mg/dL   Calcium 9.8 8.9 - 10.3 mg/dL   Total Protein 6.7 6.5 - 8.1 g/dL   Albumin 3.4 (L) 3.5 - 5.0 g/dL   AST 77 (H) 15 - 41 U/L   ALT 53 17 - 63 U/L   Alkaline Phosphatase 133 (H) 38 - 126 U/L   Total Bilirubin 1.0 0.3 - 1.2 mg/dL   GFR calc non Af Amer 37 (L) >60 mL/min   GFR calc Af Amer 43 (L) >60 mL/min   Anion gap 15 5 - 15  Troponin I     Status: None   Collection Time: 11/30/16  2:24 PM  Result Value Ref Range   Troponin I <0.03 <0.03 ng/mL  CBC WITH DIFFERENTIAL     Status: Abnormal   Collection Time: 11/30/16  2:24 PM  Result Value Ref Range   WBC 27.8 (H) 3.8 - 10.6 K/uL   RBC 5.14 4.40 - 5.90 MIL/uL   Hemoglobin 15.8 13.0 - 18.0 g/dL   HCT 47.0 40.0 - 52.0 %   MCV 91.5 80.0 - 100.0 fL   MCH 30.8 26.0 - 34.0 pg   MCHC 33.7 32.0 - 36.0 g/dL   RDW 13.6 11.5 - 14.5 %   Platelets 346 150 - 440 K/uL   Neutrophils Relative % 47 %   Neutro Abs 13.3 (H) 1.4 - 6.5 K/uL   Lymphocytes Relative 45 %   Lymphs Abs 12.5 (H) 1.0 - 3.6 K/uL   Monocytes Relative 6 %   Monocytes Absolute 1.6 (H) 0.2 - 1.0 K/uL   Eosinophils Relative 1 %   Eosinophils Absolute 0.3 0 - 0.7 K/uL   Basophils Relative 1 %   Basophils Absolute 0.2 (H) 0 - 0.1 K/uL   Urinalysis, Complete w Microscopic     Status: Abnormal   Collection Time: 11/30/16  3:17 PM  Result Value Ref Range   Color, Urine YELLOW (A) YELLOW   APPearance CLEAR (A) CLEAR   Specific Gravity, Urine 1.032 (H) 1.005 - 1.030   pH 7.0 5.0 - 8.0   Glucose, UA NEGATIVE NEGATIVE mg/dL   Hgb urine dipstick MODERATE (A) NEGATIVE   Bilirubin Urine NEGATIVE NEGATIVE   Ketones, ur NEGATIVE NEGATIVE mg/dL   Protein, ur NEGATIVE NEGATIVE mg/dL   Nitrite NEGATIVE NEGATIVE   Leukocytes,  UA NEGATIVE NEGATIVE   RBC / HPF TOO NUMEROUS TO COUNT 0 - 5 RBC/hpf   WBC, UA 0-5 0 - 5 WBC/hpf   Bacteria, UA NONE SEEN NONE SEEN   Squamous Epithelial / LPF 0-5 (A) NONE SEEN  Lactic acid, plasma     Status: Abnormal   Collection Time: 11/30/16  6:00 PM  Result Value Ref Range   Lactic Acid, Venous 2.1 (HH) 0.5 - 1.9 mmol/L   ____________________________________________  EKG My review and personal interpretation at Time: 15:26  Indication: hypotension  Rate: 115  Rhythm: sinus Axis: normal Other: non specific st and t wave changes, occasional pvc ____________________________________________  RADIOLOGY  I personally reviewed all radiographic images ordered to evaluate for the above acute complaints and reviewed radiology reports and findings.  These findings were personally discussed with the patient.  Please see medical record for radiology report.  ____________________________________________   PROCEDURES  Procedure(s) performed:  Procedures    Critical Care performed: yes CRITICAL CARE Performed by: Merlyn Lot   Total critical care time: 45 minutes  Critical care time was exclusive of separately billable procedures and treating other patients.  Critical care was necessary to treat or prevent imminent or life-threatening deterioration.  Critical care was time spent personally by me on the following activities: development of treatment plan with patient and/or surrogate as  well as nursing, discussions with consultants, evaluation of patient's response to treatment, examination of patient, obtaining history from patient or surrogate, ordering and performing treatments and interventions, ordering and review of laboratory studies, ordering and review of radiographic studies, pulse oximetry and re-evaluation of patient's condition.  ____________________________________________   INITIAL IMPRESSION / ASSESSMENT AND PLAN / ED COURSE  Pertinent labs & imaging results that were available during my care of the patient were reviewed by me and considered in my medical decision making (see chart for details).  DDX: celluliits, abscess, diverticulitis, colitis, pna, bacteremia  William Kennedy is a 76 y.o. who presents to the ED with symptoms as described above. He is afebrile but tachycardic and confused. Abdominal exam is soft but limited due to morbid obesity. Does have evidence of persistent cellulitis and ecchymosis over the left abscess area. Still purulent drainage. Likely source of infection. As patient meet septic criteria will start broad-spectrum antibiotics. We'll order CT imaging to evaluate for T infection as well as to evaluate for any persistent abscess given his comp located history and recent diverticulitis with perforation.  The patient will be placed on continuous pulse oximetry and telemetry for monitoring.  Laboratory evaluation will be sent to evaluate for the above complaints.     Clinical Course as of Dec 01 1951  Tue Nov 30, 2016  1643 Sepsis - Repeat Assessment  Performed at:    4:44 PM   Vitals     @VITALSNOTREFRESHABLE @  Heart:     Tachycardic  Lungs:    CTA  Capillary Refill:   <2 sec  Peripheral Pulse:   Radial pulse palpable, Dorsalis pedis pulse  palpable and Posterior tibialis pulse  palpable  Skin:     Normal Color      [PR]  1716 Discussed CT imaging with patient and family at bedside. Patient remains encephalopathic. Patient  received IV antibiotics to cover cellulitis as well as any worsening diverticulosis or diverticulitis. Based on his enlarged prostate with bladder obstruction certainly concern for urinary tract infection versus prostatitis.  [PR]  3154 Urine with hematuria likely secondary to recent procedures. We'll send for  culture. Heart rate continues to improve with IV fluids. Patient still encephalopathic but continues to improve with our interventions.  [PR]    Clinical Course User Index [PR] Merlyn Lot, MD     ____________________________________________   FINAL CLINICAL IMPRESSION(S) / ED DIAGNOSES  Final diagnoses:  Severe sepsis ([place])  Diverticulitis  Chest wall abscess  Acute encephalopathy      NEW MEDICATIONS STARTED DURING THIS VISIT:  New Prescriptions   No medications on file     Note:  This document was prepared using Dragon voice recognition software and may include unintentional dictation errors.    Merlyn Lot, MD 11/30/16 [phone]

## 2016-11-30 NOTE — ED Notes (Signed)
Attempted IV in L hand, vein blew. Pt did not tolerate well. PICC line in R arm will not draw back. Insertion at [facility] is read.   Pt has drain from abscess on L posterior shoulder. Red and swollen. States changed dressing today. Pt has been taking antibiotic pill in morning and receiving a 10 min IV antibiotic at night.   Wife states PICC did not draw back yesterday.

## 2016-11-30 NOTE — ED Notes (Signed)
Admitting MD aware of critical lab

## 2016-12-01 DIAGNOSIS — R3129 Other microscopic hematuria: Secondary | ICD-10-CM

## 2016-12-01 DIAGNOSIS — R339 Retention of urine, unspecified: Secondary | ICD-10-CM

## 2016-12-01 LAB — BASIC METABOLIC PANEL
Anion gap: 9 (ref 5–15)
BUN: 10 mg/dL (ref 6–20)
CALCIUM: 8.9 mg/dL (ref 8.9–10.3)
CO2: 27 mmol/L (ref 22–32)
CREATININE: 1.29 mg/dL — AB (ref 0.61–1.24)
Chloride: 102 mmol/L (ref 101–111)
GFR calc Af Amer: >60 mL/min (ref >60)
GFR, EST NON AFRICAN AMERICAN: 53 mL/min — AB (ref >60)
GLUCOSE: 94 mg/dL (ref 65–99)
Potassium: 3.8 mmol/L (ref 3.5–5.1)
Sodium: 138 mmol/L (ref 135–145)

## 2016-12-01 LAB — CBC
HCT: 43.2 % (ref 40.0–52.0)
HEMOGLOBIN: 14.7 g/dL (ref 13.0–18.0)
MCH: 30.8 pg (ref 26.0–34.0)
MCHC: 34 g/dL (ref 32.0–36.0)
MCV: 90.4 fL (ref 80.0–100.0)
PLATELETS: 313 10*3/uL (ref 150–440)
RBC: 4.78 MIL/uL (ref 4.40–5.90)
RDW: 13.9 % (ref 11.5–14.5)
WBC: 21.6 10*3/uL — ABNORMAL HIGH (ref 3.8–10.6)

## 2016-12-01 LAB — C DIFFICILE QUICK SCREEN W PCR REFLEX
C DIFFICLE (CDIFF) ANTIGEN: NEGATIVE
C Diff interpretation: NOT DETECTED
C Diff toxin: NEGATIVE

## 2016-12-01 MED ORDER — METOPROLOL TARTRATE 25 MG PO TABS
12.5000 mg | ORAL_TABLET | Freq: Two times a day (BID) | ORAL | Status: DC
  Administered 2016-12-01 – 2016-12-06 (×12): 12.5 mg via ORAL
  Filled 2016-12-01 (×12): qty 1

## 2016-12-01 MED ORDER — SODIUM CHLORIDE 0.9 % IV SOLN
1250.0000 mg | Freq: Two times a day (BID) | INTRAVENOUS | Status: DC
  Administered 2016-12-01 – 2016-12-03 (×4): 1250 mg via INTRAVENOUS
  Filled 2016-12-01 (×5): qty 1250

## 2016-12-01 MED ORDER — FINASTERIDE 5 MG PO TABS
5.0000 mg | ORAL_TABLET | Freq: Every day | ORAL | Status: DC
  Administered 2016-12-01 – 2016-12-06 (×6): 5 mg via ORAL
  Filled 2016-12-01 (×6): qty 1

## 2016-12-01 MED ORDER — ALTEPLASE 2 MG IJ SOLR
2.0000 mg | Freq: Once | INTRAMUSCULAR | Status: DC
  Filled 2016-12-01: qty 2

## 2016-12-01 NOTE — Plan of Care (Signed)
POC shows concern of continued infection and definite AMS (far from patient's baseline).  Dr. Milus Mallick and gave verbal orders to remove PICC line for now since site assessment showed redness and warm to the touch.  Once infection is better under control, the need for a new PICC line will be re-assessed and inserted if needed.

## 2016-12-01 NOTE — Progress Notes (Signed)
SNF Benefits:  Number called: [phone] Rep: [facility] Reference Number: [id number] - [id number]  [facility] plan active as of 05/25/15 and is patient's primary coverage. Deductible and out of pocket max amounts are as follows for in and out of network:  Deductible: -In Network: $1,750 (met in full) -Out of Network: $3,500 ($0 met at time of call)  Out of Pocket Max:  -In Network:$3,000 (met in full) -Out of Network: $6,000 ($0 met at time of call)  In Network SNF:   Covered at 100% of the allowable amount for services as both deductible and out of pocket max amounts met for calendar year. Per rep, no visit limit listed on policy.  Josem Kaufmann is required: [phone].    Out of Network SNF: Once $3,000 out of network deductible met, subject to 35% co-insurance for services.  Per rep, no visit limit listed on policy.  Josem Kaufmann is required: [phone].     Per rep, if SNF in-network with local BCBS, facility should also be in-network with [employer].  Encouraged to call [phone] or the local [facility] provider pre-certification line ([phone]) to verify in-network status.

## 2016-12-01 NOTE — Clinical Social Work Note (Signed)
Clinical Social Work Assessment  Patient Details  Name: William Kennedy MRN: [id number] Date of Birth: Nov 10, 1940  Date of referral:  12/01/16               Reason for consult:  Facility Placement                Permission sought to share information with:  Chartered certified accountant granted to share information::  Yes, Verbal Permission Granted  Name::      [name]::   [place]   Relationship::     Contact Information:     Housing/Transportation Living arrangements for the past 2 months:  [place] of Information:  Spouse Patient Interpreter Needed:  None Criminal Activity/Legal Involvement Pertinent to Current Situation/Hospitalization:  No - Comment as needed Significant Relationships:  Spouse Lives with:  Spouse Do you feel safe going back to the place where you live?  Yes Need for family participation in patient care:  Yes (Comment)  Care giving concerns:  Patient lives in an apartment in [place] with his wife William Kennedy [phone].     Social Worker assessment / plan:  Holiday representative (CSW) received verbal consult from PT that recommendation is SNF. CSW met with patient and his wife William Kennedy was at bedside. Patient was not alert and oriented and did not participate in assessment. Wife reported that patient lives in an apartment with her and they are pleased with [facility]. Wife reported that patient is alert and oriented X4 at baseline however the infection has gotten worse. Wife reported that patient works 3rd shift and is eager to get back to work once he is feeling better. Wife confirmed that BCBS is patient's primary insurance and medicare is secondary. CSW explained SNF process. Wife reported that she would consider SNF and prefers [facility]. FL2 complete and faxed out.   CSW provided bed offers to wife and she chose [facility]. CSW explained to wife that BCBS will have to approve SNF. Wife verbalized her  understanding. William Kennedy [facility] liaison started [facility] SNF authorization today. CSW will continue to follow and assist as needed.    Employment status:  Therapist, music:  Medicare, Managed Care PT Recommendations:  [place] / Referral to community resources:  [place]  Patient/Family's Response to care:  Patient's wife is agreeable to [facility] starting [facility] SNF authorization.   Patient/Family's Understanding of and Emotional Response to Diagnosis, Current Treatment, and Prognosis:  Patient's wife was very pleasant and thanked CSW for visit.   Emotional Assessment Appearance:  Appears stated age Attitude/Demeanor/Rapport:  Unable to Assess Affect (typically observed):  Unable to Assess Orientation:  Oriented to Self, Fluctuating Orientation (Suspected and/or reported [place]) Alcohol / Substance use:  Not Applicable Psych involvement (Current and /or in the community):  No (Comment)  Discharge Needs  Concerns to be addressed:  Discharge Planning Concerns Readmission within the last 30 days:  No Current discharge risk:  Dependent with Mobility Barriers to Discharge:  Continued Medical Work up   [employer], Veronia Beets, LCSW 12/01/2016, 2:42 PM

## 2016-12-01 NOTE — NC FL2 (Signed)
[place] LEVEL OF CARE SCREENING TOOL     IDENTIFICATION  Patient Name: William Kennedy Birthdate: 03-10-1941 Sex: male Admission Date (Current Location): 11/30/2016  [place] and [place] Number:  Engineering geologist and Address:  [facility], [address] [place], [place] [address]      Provider Number: [id number]  Attending Physician Name and Address:  Vaughan Basta, *  Relative Name and Phone Number:       Current Level of Care: Hospital Recommended Level of Care: [place] Prior Approval Number:    Date Approved/Denied:   PASRR Number:  [id number] )  Discharge Plan: SNF    Current Diagnoses: Patient Active Problem List   Diagnosis Date Noted  . Sepsis ([place]) 11/30/2016  . Left ureteral stone   . Cellulitis and abscess of trunk 10/29/2016    Orientation RESPIRATION BLADDER Height & Weight     Self  Normal Continent Weight: 240 lb (108.9 kg) Height:  6\' 2"  (188 cm)  BEHAVIORAL SYMPTOMS/MOOD NEUROLOGICAL BOWEL NUTRITION STATUS   (none)  (none ) Incontinent Diet (Diet: Full Liquid )  AMBULATORY STATUS COMMUNICATION OF NEEDS Skin   Extensive Assist Verbally PU Stage and Appropriate Care, Other (Comment) (Abscess to left upper back  0.2 cm x 2 cm opening Cleanse wound to right upper back. Algisite alginate dressing to absorb drainage. Cover with silicone border foam. Change alginate dressing daily and silicone border foam every other day and PRN saturatio)                       Personal Care Assistance Level of Assistance  Bathing, Feeding, Dressing Bathing Assistance: Limited assistance Feeding assistance: Independent Dressing Assistance: Limited assistance     Functional Limitations Info  Sight, Hearing, Speech Sight Info: Adequate Hearing Info: Impaired Speech Info: Adequate    SPECIAL CARE FACTORS FREQUENCY  PT (By licensed PT), OT (By licensed OT)     PT Frequency:  (5) OT  Frequency:  (5)            Contractures      Additional Factors Info  Code Status, Allergies, Isolation Precautions Code Status Info:  (Full Code. ) Allergies Info:  (No Known Allergies. )     Isolation Precautions Info:  (Enteric precautions. )     Current Medications (12/01/2016):  This is the current hospital active medication list Current Facility-Administered Medications  Medication Dose Route Frequency Provider Last Rate Last Dose  . 0.9 %  sodium chloride infusion   Intravenous Continuous Hillary Bow, MD 75 mL/hr at 11/30/16 2058    . acetaminophen (TYLENOL) tablet 650 mg  650 mg Oral Q6H PRN Hillary Bow, MD       Or  . acetaminophen (TYLENOL) suppository 650 mg  650 mg Rectal Q6H PRN Sudini, Srikar, MD      . albuterol (PROVENTIL) (2.5 MG/3ML) 0.083% nebulizer solution 2.5 mg  2.5 mg Nebulization Q2H PRN Sudini, Srikar, MD      . enoxaparin (LOVENOX) injection 40 mg  40 mg Subcutaneous Q24H Hillary Bow, MD   40 mg at 11/30/16 2235  . fentaNYL (SUBLIMAZE) injection 100 mcg  100 mcg Intravenous Q1H PRN Merlyn Lot, MD   100 mcg at 11/30/16 1814  . hydrALAZINE (APRESOLINE) injection 10 mg  10 mg Intravenous Q6H PRN Sudini, Alveta Heimlich, MD      . HYDROcodone-acetaminophen (NORCO/VICODIN) 5-325 MG per tablet 1-2 tablet  1-2 tablet Oral Q4H PRN Sudini, Artist,  MD      . LORazepam (ATIVAN) injection 0.5 mg  0.5 mg Intravenous Q6H PRN Hillary Bow, MD   0.5 mg at 11/30/16 2122  . morphine 2 MG/ML injection 2 mg  2 mg Intravenous Q4H PRN Hillary Bow, MD   2 mg at 12/01/16 0304  . ondansetron (ZOFRAN) tablet 4 mg  4 mg Oral Q6H PRN Sudini, Alveta Heimlich, MD       Or  . ondansetron (ZOFRAN) injection 4 mg  4 mg Intravenous Q6H PRN Sudini, Srikar, MD      . piperacillin-tazobactam (ZOSYN) IVPB 3.375 g  3.375 g Intravenous Q8H Merlyn Lot, MD 12.5 mL/hr at 12/01/16 0451 3.375 g at 12/01/16 0451  . polyethylene glycol (MIRALAX / GLYCOLAX) packet 17 g  17 g Oral Daily PRN  Sudini, Alveta Heimlich, MD      . tamsulosin (FLOMAX) capsule 0.4 mg  0.4 mg Oral Daily Sudini, Srikar, MD      . vancomycin (VANCOCIN) 1,500 mg in sodium chloride 0.9 % 500 mL IVPB  1,500 mg Intravenous Q12H Merlyn Lot, MD   Stopped at 12/01/16 0035  . vancomycin (VANCOCIN) 50 mg/mL oral solution 500 mg  500 mg Oral Q6H Sudini, Srikar, MD   500 mg at 12/01/16 1674     Discharge Medications: Please see discharge summary for a list of discharge medications.  Relevant Imaging Results:  Relevant Lab Results:   Additional Information  (SSN: [id number])  [employer], Veronia Beets, LCSW

## 2016-12-01 NOTE — Consult Note (Signed)
[facility] Infectious Disease     Reason for Consult: Back Abscess, diverticulitis, C diff   Referring Physician: Karlton Lemon Date of Admission:  11/30/2016   Active Problems:   Sepsis ([place])  HPI: William Kennedy is a 76 y.o. male admitted with confusion and hypotension while on otpt IV unasyn and oral vanco for C diff.  He also had complains of abd pain.  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  He had recent admission with back abscess s/p I and D and cx grew MSSA. He also was found on CT to have ruptured diverticulitis and a renal stone with mild L hydro.  At that time he had surgery evaluation and not a great surgical candidate for diverticulitis. Had recurrent diarrhea and C diff +.  He had lithotripsy done 6/14.   On this admission wbc 28 (near his baseline) but he has confusion and had fever to 101.  He was found on CT  to have markedly enlarged prostate and Bladder outlet obstruction and foley was placed.  Still with changes of diverticulitis but no progression or abscess formation.  Currently very confused and wife at bedside.   Past Medical History:  Diagnosis Date  . Abscess    shoulder  . Leukemia [facility])    Past Surgical History:  Procedure Laterality Date  . BACK SURGERY    . CYSTOSCOPY WITH URETEROSCOPY AND STENT PLACEMENT Left 11/06/2016   Procedure: CYSTOSCOPY WITH URETEROSCOPY AND STENT PLACEMENT retrograde pyelogram,holmium laser;  Surgeon: Irine Seal, MD;  Location: [facility] ORS;  Service: Urology;  Laterality: Left;  . EXTRACORPOREAL SHOCK WAVE LITHOTRIPSY Left 11/04/2016   Procedure: EXTRACORPOREAL SHOCK WAVE LITHOTRIPSY (ESWL);  Surgeon: Hollice Espy, MD;  Location: [facility] ORS;  Service: Urology;  Laterality: Left;   Social History  Substance Use Topics  . Smoking status: Former Research scientist (life sciences)  . Smokeless tobacco: Former Systems developer  . Alcohol use No   Family History  Problem Relation Age of Onset  . Diabetes Mother   . Cancer Father     Allergies: No Known  Allergies  Current antibiotics: Antibiotics Given (last 72 hours)    Date/Time Action Medication Dose Rate   11/30/16 1435 New Bag/Given   piperacillin-tazobactam (ZOSYN) IVPB 3.375 g 3.375 g 100 mL/hr   11/30/16 1524 New Bag/Given   vancomycin (VANCOCIN) IVPB 1000 mg/200 mL premix 1,000 mg 200 mL/hr   11/30/16 2058 New Bag/Given   piperacillin-tazobactam (ZOSYN) IVPB 3.375 g 3.375 g 12.5 mL/hr   11/30/16 2122 Given  [med from pharmacy]   vancomycin (VANCOCIN) 50 mg/mL oral solution 500 mg 500 mg    11/30/16 2235 New Bag/Given   vancomycin (VANCOCIN) 1,500 mg in sodium chloride 0.9 % 500 mL IVPB 1,500 mg 250 mL/hr   12/01/16 0125 Given   vancomycin (VANCOCIN) 50 mg/mL oral solution 500 mg 500 mg    12/01/16 0451 New Bag/Given   piperacillin-tazobactam (ZOSYN) IVPB 3.375 g 3.375 g 12.5 mL/hr   12/01/16 0646 Given   vancomycin (VANCOCIN) 50 mg/mL oral solution 500 mg 500 mg    12/01/16 1016 New Bag/Given   vancomycin (VANCOCIN) 1,500 mg in sodium chloride 0.9 % 500 mL IVPB 1,500 mg 250 mL/hr      MEDICATIONS: . alteplase  2 mg Intracatheter Once  . enoxaparin (LOVENOX) injection  40 mg Subcutaneous Q24H  . metoprolol tartrate  12.5 mg Oral BID  . tamsulosin  0.4 mg Oral Daily  . vancomycin  500 mg Oral Q6H  Review of Systems - unable to obtain   OBJECTIVE: Temp:  [98.3 F (36.8 C)-101.2 F (38.4 C)] 99.2 F (37.3 C) (07/11 0035) Pulse Rate:  [97-126] 102 (07/11 1018) Resp:  [18-22] 22 (07/11 0035) BP: (108-157)/(63-102) 110/82 (07/11 1018) SpO2:  [92 %-100 %] 100 % (07/11 0035) Weight:  [108.9 kg (240 lb)] 108.9 kg (240 lb) (07/11 0428) Physical Exam  Constitutional: He is disoriented, agitated, lying in bed  Obese, disheveled. HENT: anicteric Mouth/Throat: Oropharynx is clear and dry . No oropharyngeal exudate.  Cardiovascular: Normal rate, regular rhythm and normal heart sounds Pulmonary/Chest: Effort normal and breath sounds normal. No respiratory distress. He  has no wheezes.  Abdominal: obese,  Mild diffuse ttp Lymphadenopathy: He has no cervical adenopathy.  Neurological: He is confused   Skin: L upper back with incision site with penrose in place with purulent drainage. Decreased but still significant surrounding redness and induration  Psychiatric: agitated LABS: Results for orders placed or performed during the hospital encounter of 11/30/16 (from the past 48 hour(s))  Lactic acid, plasma     Status: Abnormal   Collection Time: 11/30/16  2:24 PM  Result Value Ref Range   Lactic Acid, Venous 4.1 (HH) 0.5 - 1.9 mmol/L    Comment: CRITICAL RESULT CALLED TO, READ BACK BY AND VERIFIED WITH NELLIE MONAR AT 1510 ON 11/30/2016 JJB   Comprehensive metabolic panel     Status: Abnormal   Collection Time: 11/30/16  2:24 PM  Result Value Ref Range   Sodium 133 (L) 135 - 145 mmol/L   Potassium 4.5 3.5 - 5.1 mmol/L   Chloride 94 (L) 101 - 111 mmol/L   CO2 24 22 - 32 mmol/L   Glucose, Bld 119 (H) 65 - 99 mg/dL   BUN 12 6 - 20 mg/dL   Creatinine, Ser 1.72 (H) 0.61 - 1.24 mg/dL   Calcium 9.8 8.9 - 10.3 mg/dL   Total Protein 6.7 6.5 - 8.1 g/dL   Albumin 3.4 (L) 3.5 - 5.0 g/dL   AST 77 (H) 15 - 41 U/L   ALT 53 17 - 63 U/L   Alkaline Phosphatase 133 (H) 38 - 126 U/L   Total Bilirubin 1.0 0.3 - 1.2 mg/dL   GFR calc non Af Amer 37 (L) >60 mL/min   GFR calc Af Amer 43 (L) >60 mL/min    Comment: (NOTE) The eGFR has been calculated using the CKD EPI equation. This calculation has not been validated in all clinical situations. eGFR's persistently <60 mL/min signify possible Chronic Kidney Disease.    Anion gap 15 5 - 15  Troponin I     Status: None   Collection Time: 11/30/16  2:24 PM  Result Value Ref Range   Troponin I <0.03 <0.03 ng/mL  CBC WITH DIFFERENTIAL     Status: Abnormal   Collection Time: 11/30/16  2:24 PM  Result Value Ref Range   WBC 27.8 (H) 3.8 - 10.6 K/uL   RBC 5.14 4.40 - 5.90 MIL/uL   Hemoglobin 15.8 13.0 - 18.0 g/dL   HCT  47.0 40.0 - 52.0 %   MCV 91.5 80.0 - 100.0 fL   MCH 30.8 26.0 - 34.0 pg   MCHC 33.7 32.0 - 36.0 g/dL   RDW 13.6 11.5 - 14.5 %   Platelets 346 150 - 440 K/uL   Neutrophils Relative % 47 %   Neutro Abs 13.3 (H) 1.4 - 6.5 K/uL   Lymphocytes Relative 45 %   Lymphs Abs 12.5 (H)  1.0 - 3.6 K/uL   Monocytes Relative 6 %   Monocytes Absolute 1.6 (H) 0.2 - 1.0 K/uL   Eosinophils Relative 1 %   Eosinophils Absolute 0.3 0 - 0.7 K/uL   Basophils Relative 1 %   Basophils Absolute 0.2 (H) 0 - 0.1 K/uL  Blood Culture (routine x 2)     Status: None (Preliminary result)   Collection Time: 11/30/16  2:43 PM  Result Value Ref Range   Specimen Description BLOOD BLOOD LEFT HAND    Special Requests      BOTTLES DRAWN AEROBIC AND ANAEROBIC Blood Culture results may not be optimal due to an inadequate volume of blood received in culture bottles   Culture NO GROWTH < 24 HOURS    Report Status PENDING   Blood Culture (routine x 2)     Status: None (Preliminary result)   Collection Time: 11/30/16  3:17 PM  Result Value Ref Range   Specimen Description BLOOD BLOOD LEFT FOREARM    Special Requests      BOTTLES DRAWN AEROBIC AND ANAEROBIC Blood Culture adequate volume   Culture NO GROWTH < 24 HOURS    Report Status PENDING   Urinalysis, Complete w Microscopic     Status: Abnormal   Collection Time: 11/30/16  3:17 PM  Result Value Ref Range   Color, Urine YELLOW (A) YELLOW   APPearance CLEAR (A) CLEAR   Specific Gravity, Urine 1.032 (H) 1.005 - 1.030   pH 7.0 5.0 - 8.0   Glucose, UA NEGATIVE NEGATIVE mg/dL   Hgb urine dipstick MODERATE (A) NEGATIVE   Bilirubin Urine NEGATIVE NEGATIVE   Ketones, ur NEGATIVE NEGATIVE mg/dL   Protein, ur NEGATIVE NEGATIVE mg/dL   Nitrite NEGATIVE NEGATIVE   Leukocytes, UA NEGATIVE NEGATIVE   RBC / HPF TOO NUMEROUS TO COUNT 0 - 5 RBC/hpf   WBC, UA 0-5 0 - 5 WBC/hpf   Bacteria, UA NONE SEEN NONE SEEN   Squamous Epithelial / LPF 0-5 (A) NONE SEEN  Lactic acid, plasma      Status: Abnormal   Collection Time: 11/30/16  6:00 PM  Result Value Ref Range   Lactic Acid, Venous 2.1 (HH) 0.5 - 1.9 mmol/L    Comment: CRITICAL RESULT CALLED TO, READ BACK BY AND VERIFIED WITH NELL MORNER 11/30/16 1845 KLW   Basic metabolic panel     Status: Abnormal   Collection Time: 12/01/16  4:30 AM  Result Value Ref Range   Sodium 138 135 - 145 mmol/L   Potassium 3.8 3.5 - 5.1 mmol/L   Chloride 102 101 - 111 mmol/L   CO2 27 22 - 32 mmol/L   Glucose, Bld 94 65 - 99 mg/dL   BUN 10 6 - 20 mg/dL   Creatinine, Ser 1.29 (H) 0.61 - 1.24 mg/dL   Calcium 8.9 8.9 - 10.3 mg/dL   GFR calc non Af Amer 53 (L) >60 mL/min   GFR calc Af Amer >60 >60 mL/min    Comment: (NOTE) The eGFR has been calculated using the CKD EPI equation. This calculation has not been validated in all clinical situations. eGFR's persistently <60 mL/min signify possible Chronic Kidney Disease.    Anion gap 9 5 - 15  CBC     Status: Abnormal   Collection Time: 12/01/16  4:30 AM  Result Value Ref Range   WBC 21.6 (H) 3.8 - 10.6 K/uL   RBC 4.78 4.40 - 5.90 MIL/uL   Hemoglobin 14.7 13.0 - 18.0 g/dL   HCT  43.2 40.0 - 52.0 %   MCV 90.4 80.0 - 100.0 fL   MCH 30.8 26.0 - 34.0 pg   MCHC 34.0 32.0 - 36.0 g/dL   RDW 13.9 11.5 - 14.5 %   Platelets 313 150 - 440 K/uL  C difficile quick scan w PCR reflex     Status: None   Collection Time: 12/01/16  9:15 AM  Result Value Ref Range   C Diff antigen NEGATIVE NEGATIVE   C Diff toxin NEGATIVE NEGATIVE   C Diff interpretation No C. difficile detected.    No components found for: ESR, C REACTIVE PROTEIN MICRO: Recent Results (from the past 720 hour(s))  Gastrointestinal Panel by PCR , Stool     Status: Abnormal   Collection Time: 11/03/16 10:04 AM  Result Value Ref Range Status   Campylobacter species NOT DETECTED NOT DETECTED Final   Plesimonas shigelloides NOT DETECTED NOT DETECTED Final   Salmonella species NOT DETECTED NOT DETECTED Final   Yersinia enterocolitica  NOT DETECTED NOT DETECTED Final   Vibrio species NOT DETECTED NOT DETECTED Final   Vibrio cholerae NOT DETECTED NOT DETECTED Final   Enteroaggregative E coli (EAEC) NOT DETECTED NOT DETECTED Final   Enteropathogenic E coli (EPEC) DETECTED (A) NOT DETECTED Final    Comment: RESULT CALLED TO, READ BACK BY AND VERIFIED WITH: SUSANA PATINO 11/03/16 @ 1411  MLK    Enterotoxigenic E coli (ETEC) NOT DETECTED NOT DETECTED Final   Shiga like toxin producing E coli (STEC) NOT DETECTED NOT DETECTED Final   Shigella/Enteroinvasive E coli (EIEC) NOT DETECTED NOT DETECTED Final   Cryptosporidium NOT DETECTED NOT DETECTED Final   Cyclospora cayetanensis NOT DETECTED NOT DETECTED Final   Entamoeba histolytica NOT DETECTED NOT DETECTED Final   Giardia lamblia NOT DETECTED NOT DETECTED Final   Adenovirus F40/41 NOT DETECTED NOT DETECTED Final   Astrovirus NOT DETECTED NOT DETECTED Final   Norovirus GI/GII NOT DETECTED NOT DETECTED Final   Rotavirus A NOT DETECTED NOT DETECTED Final   Sapovirus (I, II, IV, and V) NOT DETECTED NOT DETECTED Final  C difficile quick scan w PCR reflex     Status: Abnormal   Collection Time: 11/03/16 10:04 AM  Result Value Ref Range Status   C Diff antigen POSITIVE (A) NEGATIVE Final   C Diff toxin NEGATIVE NEGATIVE Final   C Diff interpretation Results are indeterminate. See PCR results.  Final  Clostridium Difficile by PCR     Status: Abnormal   Collection Time: 11/03/16 10:04 AM  Result Value Ref Range Status   Toxigenic C Difficile by pcr POSITIVE (A) NEGATIVE Final    Comment: Positive for toxigenic C. difficile with little to no toxin production. Only treat if clinical presentation suggests symptomatic illness.  Stone analysis     Status: None   Collection Time: 11/06/16 11:30 AM  Result Value Ref Range Status   Color Owens Shark  Final   Size Comment mm Final    Comment: Specimen received as fragments.   Stone Weight KSTONE 22.0 mg Corrected   Nidus No Nidus visualized   Corrected   Ca Oxalate,Dihydrate 5 % Corrected   Ca Oxalate,Monohydr. 92 % Corrected   Ca phos cry stone ql IR 3 % Corrected   Composition Comment  Corrected    Comment: Percentage (Represents the % composition)   Photo Comment  Corrected    Comment: Photograph will follow under separate cover.   Comment: Comment  Corrected    Comment: (NOTE) Physician questions  regarding Calculi Analysis contact [facility] at: [phone].    PLEASE NOTE: Comment  Corrected    Comment: (NOTE) Calculi report with photograph will follow via computer, mail or courier delivery.    Disclaimer - Kidney Stone Analysis: Comment  Corrected    Comment: (NOTE) This test was developed and its performance characteristics determined by [facility]. It has not been cleared or approved by the Food and Drug Administration. Performed At: [facility] [place], [place] [id number] Lindon Romp MD WG:[id number]   Blood Culture (routine x 2)     Status: None (Preliminary result)   Collection Time: 11/30/16  2:43 PM  Result Value Ref Range Status   Specimen Description BLOOD BLOOD LEFT HAND  Final   Special Requests   Final    BOTTLES DRAWN AEROBIC AND ANAEROBIC Blood Culture results may not be optimal due to an inadequate volume of blood received in culture bottles   Culture NO GROWTH < 24 HOURS  Final   Report Status PENDING  Incomplete  Blood Culture (routine x 2)     Status: None (Preliminary result)   Collection Time: 11/30/16  3:17 PM  Result Value Ref Range Status   Specimen Description BLOOD BLOOD LEFT FOREARM  Final   Special Requests   Final    BOTTLES DRAWN AEROBIC AND ANAEROBIC Blood Culture adequate volume   Culture NO GROWTH < 24 HOURS  Final   Report Status PENDING  Incomplete  C difficile quick scan w PCR reflex     Status: None   Collection Time: 12/01/16  9:15 AM  Result Value Ref Range Status   C Diff antigen NEGATIVE NEGATIVE Final   C Diff toxin NEGATIVE NEGATIVE  Final   C Diff interpretation No C. difficile detected.  Final    IMAGING: Ct Head Wo Contrast  Result Date: 11/30/2016 CLINICAL DATA:  Altered mental status. EXAM: CT HEAD WITHOUT CONTRAST TECHNIQUE: Contiguous axial images were obtained from the base of the skull through the vertex without intravenous contrast. COMPARISON:  None. FINDINGS: Brain: Mild diffuse cortical atrophy is noted. Mild chronic ischemic white matter disease is noted. No mass effect or midline shift is noted. Ventricular size is within normal limits. There is no evidence of mass lesion, hemorrhage or acute infarction. Vascular: No hyperdense vessel or unexpected calcification. Skull: Normal. Negative for fracture or focal lesion. Sinuses/Orbits: No acute finding. Other: None. IMPRESSION: Mild diffuse cortical atrophy. Mild chronic ischemic white matter disease. No acute intracranial abnormality seen. Electronically Signed   By: Marijo Conception, M.D.   On: 11/30/2016 16:21   Ct Chest W Contrast  Result Date: 11/30/2016 CLINICAL DATA:  Abdominal pain and hypotension. EXAM: CT CHEST, ABDOMEN, AND PELVIS WITH CONTRAST TECHNIQUE: Multidetector CT imaging of the chest, abdomen and pelvis was performed following the standard protocol during bolus administration of intravenous contrast. CONTRAST:  53m ISOVUE-300 IOPAMIDOL (ISOVUE-300) INJECTION 61% COMPARISON:  CT scan 11/08/2016 FINDINGS: CT CHEST FINDINGS Cardiovascular: The heart is normal in size. No pericardial effusion. There is tortuosity and calcification of the thoracic aorta. The branch vessels are patent. The advanced three-vessel coronary artery calcifications are noted. Mediastinum/Nodes: Small scattered mediastinal and hilar lymph nodes but no mass or overt adenopathy. The esophagus is grossly normal. Lungs/Pleura: Advanced emphysematous changes and pulmonary scarring. Significant breathing motion artifact no obvious pulmonary lesions. No acute pulmonary findings such as  infiltrate, edema or effusion. No pneumothorax. The tracheobronchial tree is grossly normal. Chest wall/ Musculoskeletal: No chest wall mass,  supraclavicular or axillary lymphadenopathy. A few small scattered lymph nodes are noted. The thyroid gland appears normal. Small amount of injected air is noted in the left subclavian vein and right internal jugular vein. No significant bony findings. CT ABDOMEN PELVIS FINDINGS Hepatobiliary: No focal hepatic lesions or intrahepatic biliary dilatation. Gallbladder is normal. No common bile duct dilatation. Pancreas: No mass, inflammation or ductal dilatation. Spleen: Normal size.  No focal lesions. Adrenals/Urinary Tract: Stable large rim calcified right adrenal gland cyst. The left adrenal gland is unremarkable. Stable scarring changes involving the left kidney. A small left renal calculus is noted. The left-sided hydroureteronephrosis, seen on the prior study, has resolved. Stable large right renal cyst. No right-sided renal or ureteral calculi. No bladder calculi. Moderate symmetric bladder wall thickening likely due to bladder outlet obstruction from a markedly enlarged prostate gland. Stomach/Bowel: The stomach, duodenum and small bowel are unremarkable. The terminal ileum is normal. The appendix is normal. Diffuse descending and sigmoid diverticulosis with acute diverticulitis involving the sigmoid colon. There is a large inflamed diverticuli also seen on the prior study with some surrounding inflammation. No complicating features such as free air or abscess. Vascular/Lymphatic: Stable advanced atherosclerotic calcifications involving the abdominal aorta and iliac arteries. The branch vessels are patent. Small caliber left renal artery with ostial calcifications bilaterally. New line no mesenteric or retroperitoneal lymphadenopathy. Small scattered lymph nodes are stable. There are also small external iliac lymph nodes which are stable. Reproductive: Markedly enlarged  prostate gland. The seminal vesicles appear normal. Other: No free pelvic fluid collection or inguinal adenopathy. Musculoskeletal: No significant bony findings. Advanced degenerative changes involving the lumbar spine. IMPRESSION: 1. Persistent or recurrent sigmoid diverticulitis without complicating features such as abscess or free air. 2. No acute or significant pulmonary findings. 3. Stable large rim calcified right adrenal gland cyst. 4. Resolution of left ureteral calculus. Small left renal calculus noted. 5. Markedly enlarged prostate gland with bladder outlet obstruction and moderate uniform bladder wall thickening. Electronically Signed   By: Marijo Sanes M.D.   On: 11/30/2016 16:29   Ct Abdomen Pelvis W Contrast  Result Date: 11/30/2016 CLINICAL DATA:  Abdominal pain and hypotension. EXAM: CT CHEST, ABDOMEN, AND PELVIS WITH CONTRAST TECHNIQUE: Multidetector CT imaging of the chest, abdomen and pelvis was performed following the standard protocol during bolus administration of intravenous contrast. CONTRAST:  41m ISOVUE-300 IOPAMIDOL (ISOVUE-300) INJECTION 61% COMPARISON:  CT scan 11/08/2016 FINDINGS: CT CHEST FINDINGS Cardiovascular: The heart is normal in size. No pericardial effusion. There is tortuosity and calcification of the thoracic aorta. The branch vessels are patent. The advanced three-vessel coronary artery calcifications are noted. Mediastinum/Nodes: Small scattered mediastinal and hilar lymph nodes but no mass or overt adenopathy. The esophagus is grossly normal. Lungs/Pleura: Advanced emphysematous changes and pulmonary scarring. Significant breathing motion artifact no obvious pulmonary lesions. No acute pulmonary findings such as infiltrate, edema or effusion. No pneumothorax. The tracheobronchial tree is grossly normal. Chest wall/ Musculoskeletal: No chest wall mass, supraclavicular or axillary lymphadenopathy. A few small scattered lymph nodes are noted. The thyroid gland appears  normal. Small amount of injected air is noted in the left subclavian vein and right internal jugular vein. No significant bony findings. CT ABDOMEN PELVIS FINDINGS Hepatobiliary: No focal hepatic lesions or intrahepatic biliary dilatation. Gallbladder is normal. No common bile duct dilatation. Pancreas: No mass, inflammation or ductal dilatation. Spleen: Normal size.  No focal lesions. Adrenals/Urinary Tract: Stable large rim calcified right adrenal gland cyst. The left adrenal gland is unremarkable. Stable  scarring changes involving the left kidney. A small left renal calculus is noted. The left-sided hydroureteronephrosis, seen on the prior study, has resolved. Stable large right renal cyst. No right-sided renal or ureteral calculi. No bladder calculi. Moderate symmetric bladder wall thickening likely due to bladder outlet obstruction from a markedly enlarged prostate gland. Stomach/Bowel: The stomach, duodenum and small bowel are unremarkable. The terminal ileum is normal. The appendix is normal. Diffuse descending and sigmoid diverticulosis with acute diverticulitis involving the sigmoid colon. There is a large inflamed diverticuli also seen on the prior study with some surrounding inflammation. No complicating features such as free air or abscess. Vascular/Lymphatic: Stable advanced atherosclerotic calcifications involving the abdominal aorta and iliac arteries. The branch vessels are patent. Small caliber left renal artery with ostial calcifications bilaterally. New line no mesenteric or retroperitoneal lymphadenopathy. Small scattered lymph nodes are stable. There are also small external iliac lymph nodes which are stable. Reproductive: Markedly enlarged prostate gland. The seminal vesicles appear normal. Other: No free pelvic fluid collection or inguinal adenopathy. Musculoskeletal: No significant bony findings. Advanced degenerative changes involving the lumbar spine. IMPRESSION: 1. Persistent or recurrent  sigmoid diverticulitis without complicating features such as abscess or free air. 2. No acute or significant pulmonary findings. 3. Stable large rim calcified right adrenal gland cyst. 4. Resolution of left ureteral calculus. Small left renal calculus noted. 5. Markedly enlarged prostate gland with bladder outlet obstruction and moderate uniform bladder wall thickening. Electronically Signed   By: Marijo Sanes M.D.   On: 11/30/2016 16:29   Ct Abdomen Pelvis W Contrast  Result Date: 11/05/2016 CLINICAL DATA:  Diverticulitis. Renal stone. Improved left lower quadrant pain. EXAM: CT ABDOMEN AND PELVIS WITH CONTRAST TECHNIQUE: Multidetector CT imaging of the abdomen and pelvis was performed using the standard protocol following bolus administration of intravenous contrast. CONTRAST:  166m ISOVUE-300 IOPAMIDOL (ISOVUE-300) INJECTION 61% COMPARISON:  10/31/2016 FINDINGS: Lower chest: Minimal dependent atelectasis in the lung bases. No pleural effusion. Coronary artery atherosclerosis. Hepatobiliary: No focal liver abnormality is seen. No gallstones, gallbladder wall thickening, or biliary dilatation. Pancreas: Unremarkable. Spleen: Unremarkable. Adrenals/Urinary Tract: Unchanged 6.8 x 5.2 cm partially peripherally calcified cystic lesion arising from the right adrenal gland which may reflect the sequelae of remote hemorrhage or infection. Unchanged slight left adrenal gland thickening. Unchanged 10 cm interpolar right renal cyst and 1.7 cm left upper pole renal cyst. 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 Unchanged two 3 mm nonobstructing calculi in the upper pole/ interpolar left kidney. Unchanged 6 mm distal left ureteral calculus with persistent mild left hydroureteronephrosis. No right renal calculi or hydronephrosis. Unremarkable bladder. Stomach/Bowel: The stomach is within normal limits. There is no evidence of bowel obstruction. Extensive left-sided colonic diverticulosis is  again seen with persistent moderate sigmoid colon wall thickening and surrounding inflammatory stranding, similar to prior. The previously described gas and fluid collection associated with the posterior wall of the sigmoid colon is stable to minimally smaller than on the prior study, measuring 4.3 x 2.6 cm. There is now enteric contrast material within this collection indicating communication with the bowel lumen. No new fluid or gas collections are identified. The appendix is unremarkable. Vascular/Lymphatic: Abdominal aortic atherosclerosis without aneurysm. Unchanged 9 mm short axis left common iliac lymph noted. 12 mm short axis left external iliac lymph node is at most minimally larger than on the recent prior study. Subcentimeter short axis para-aortic lymph nodes are unchanged. Reproductive: Marked prostatic enlargement. Other: No intraperitoneal free fluid. 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 IMPRESSION: 1. Unchanged distal left ureteral calculus with mild hydroureteronephrosis. 2. Similar appearance of acute sigmoid diverticulitis. Stable to minimally decreased size of pericolonic collection/contained perforation, now containing enteric contrast material. 3.  Aortic Atherosclerosis (ICD10-I70.0). Electronically Signed   By: Logan Bores M.D.   On: 11/05/2016 12:04   Dg Chest Port 1 View  Result Date: 11/30/2016 CLINICAL DATA:  Pt brought in for hypotension. He has an abcess on the left posterior shoulder and has a picc line. Hx of leukemia. Former smoker EXAM: PORTABLE CHEST 1 VIEW COMPARISON:  Chest x-ray dated 11/05/2016. Chest x-ray dated 04/02/2010. FINDINGS: Right-sided PICC line in place with tip adequately positioned at the level of the mid/lower SVC. Heart size is upper normal, stable. Overall cardiomediastinal silhouette is stable. Coarse lung markings bilaterally suggests chronic interstitial lung disease. No new lung findings. No pleural effusion or pneumothorax seen.  Osseous structures about the chest are unremarkable. Vague lucency within the soft tissues about the left shoulder is presumably related to patient's known soft tissue abscess. IMPRESSION: No active disease.  No evidence of pneumonia or pulmonary edema. Probable chronic interstitial lung disease. Electronically Signed   By: Franki Cabot M.D.   On: 11/30/2016 14:07   Dg Chest Port 1 View  Result Date: 11/05/2016 CLINICAL DATA:  Confirm central line placement.  History of leukemia EXAM: PORTABLE CHEST 1 VIEW COMPARISON:  Portable chest x-ray of October 29, 2016 FINDINGS: The lungs are well-expanded. The interstitial markings are coarse. The PICC line tip projects over the midportion of the SVC. There is no pneumothorax or pleural effusion. The heart and pulmonary vascularity are normal. IMPRESSION: Chronic bronchitic-smoking related changes, stable. There is no postprocedure complication following right-sided PICC line placement. There is no acute cardiopulmonary abnormality. Electronically Signed   By: Layaan Mott  [place] M.D.   On: 11/05/2016 17:15   Dg C-arm 1-60 Min-no Report  Result Date: 11/06/2016 Fluoroscopy was utilized by the requesting physician.  No radiographic interpretation.    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  On admit febrile to 101 and wbc 28.  CT showed persistent diverticulitis but no free air or fluid, as well as enlarged prostate with BOO. Recently admitted for large MSSA L upper back abscess and found also to have ruptured diverticulitis with abscess C diff as well as renal stones.Marland Kitchen   He is very weak and deconditioned.  As otpt he has been on unasyn for his abd infection and MSSA abscess on back and oral vanco for C diff. I saw him 7/6 and the back abscess was improving but still present and we extended his abx course.   Recommendations Recheck IGG levels - if low repeat IVIG Continue vanco and zosyn pending bcx Consult  surgery Consult urology for his BOO from enlarged prostate.   Thank you very much for allowing me to participate in the care of this patient. Please call with questions.   Cheral Marker. Ola Spurr, MD

## 2016-12-01 NOTE — Clinical Social Work Placement (Signed)
   CLINICAL SOCIAL WORK PLACEMENT  NOTE  Date:  12/01/2016  Patient Details  Name: William Kennedy MRN: [id number] Date of Birth: 25-Sep-1940  Clinical Social Work is seeking post-discharge placement for this patient at the [place] level of care (*CSW will initial, date and re-position this form in  chart as items are completed):  Yes   Patient/family provided with [facility] Work Department's list of facilities offering this level of care within the geographic area requested by the patient (or if unable, by the patient's family).  Yes   Patient/family informed of their freedom to choose among providers that offer the needed level of care, that participate in Medicare, Medicaid or managed care program needed by the patient, have an available bed and are willing to accept the patient.  Yes   Patient/family informed of [facility]'s ownership interest in [facility] and [facility], as well as of the fact that they are under no obligation to receive care at these facilities.  PASRR submitted to EDS on       PASRR number received on       Existing PASRR number confirmed on 12/01/16     FL2 transmitted to all facilities in geographic area requested by pt/family on 12/01/16     FL2 transmitted to all facilities within larger geographic area on       Patient informed that his/her managed care company has contracts with or will negotiate with certain facilities, including the following:        Yes   Patient/family informed of bed offers received.  Patient chooses bed at  ([facility] )     Physician recommends and patient chooses bed at      Patient to be transferred to   on  .  Patient to be transferred to facility by       Patient family notified on   of transfer.  Name of family member notified:        PHYSICIAN       Additional Comment:    _______________________________________________ Emnet Monk, Veronia Beets, LCSW 12/01/2016, 2:41 PM

## 2016-12-01 NOTE — Evaluation (Signed)
Physical Therapy Evaluation Patient Details Name: William Kennedy MRN: [id number] DOB: 1940-12-31 Today's Date: 12/01/2016   History of Present Illness  presented to ER secondary to worsening mental status, abdominal pain, hypotension; admitted with severe sepsis, likely related to L upper back abscess (status post I&D with penrose drain 4-5 weeks prior to this admission).  Of note, recent hospitalization (approx 3 weeks prior) due to sigmoid diverticulitis with abscess (improved per imaging since previous admission)  Clinical Impression  Upon evaluation, patient lethargic, but awakens to voice/light touch.  Generally confused and impulsive, but follows approx 75% simple verbal commands.  Bilat UE/LE strength and ROM generally at least 4/5, though noted difficulty following commands for formal MMT.  Currently requiring mod assist for supine/sit and min/mod assist for unsupported sitting balance. Constantly attempting return to supine once positioned edge of bed; unable to redirect (with patient becoming slightly agitated) after 3-4 separate attempts.  Additional OOB activities deferred as a result.  Will continue to assess/progress as appropriate. Would benefit from skilled PT to address above deficits and promote optimal return to PLOF; recommend transition to STR upon discharge from acute hospitalization.     Follow Up Recommendations SNF    Equipment Recommendations  Rolling walker with 5" wheels    Recommendations for Other Services       Precautions / Restrictions Precautions Precautions: Fall Restrictions Weight Bearing Restrictions: No      Mobility  Bed Mobility Overal bed mobility: Needs Assistance Bed Mobility: Supine to Sit;Sit to Supine Rolling: Mod assist;+2 for safety/equipment   Supine to sit: Mod assist;+2 for safety/equipment     General bed mobility comments: patient spontaneously attempting return to supine multiple times throughout session; unable to  successfully direct after 3-4 separate attempts (patient becoming slightly agitated).  Transfers                 General transfer comment: unsafe/unable  Ambulation/Gait             General Gait Details: unsafe/unable  Stairs            Wheelchair Mobility    Modified Rankin (Stroke Patients Only)       Balance Overall balance assessment: Needs assistance Sitting-balance support: Feet supported;No upper extremity supported Sitting balance-Leahy Scale: Poor     Standing balance support: Bilateral upper extremity supported                                 Pertinent Vitals/Pain Pain Assessment: Faces Faces Pain Scale: Hurts whole lot Pain Location: generalized Pain Descriptors / Indicators: Grimacing;Guarding;Aching;Moaning Pain Intervention(s): Limited activity within patient's tolerance;Monitored during session;Repositioned    Home Living Family/patient expects to be discharged to:: Private residence Living Arrangements: Spouse/significant other Available Help at Discharge: Family;Available 24 hours/day Type of Home: House Home Access: Stairs to enter Entrance Stairs-Rails: Right;Left;Can reach both Entrance Stairs-Number of Steps: 2 Home Layout: One level Home Equipment: Walker - 2 wheels      Prior Function Level of Independence: Needs assistance         Comments: At baseline, mod indep with ADLs and mobility; however, since recent illness/infection/hospitalizations, has experienced progressive functional decline, requiring use of RW and +1 assist for all ADLs/mobility     Hand Dominance        Extremity/Trunk Assessment   Upper Extremity Assessment Upper Extremity Assessment: Generalized weakness    Lower Extremity Assessment Lower Extremity Assessment: Generalized weakness (generally  4/5, resisting therapist movement at times due to pain, general confusion at instruction)       Communication   Communication: No  difficulties  Cognition Arousal/Alertness: Lethargic Behavior During Therapy: Impulsive Overall Cognitive Status: Difficult to assess                                 General Comments: follows simple commands approx 75% time; limited awareness/insight into health condition; safety mittens on throughout session      General Comments      Exercises     Assessment/Plan    PT Assessment Patient needs continued PT services  PT Problem List Decreased strength;Decreased range of motion;Decreased activity tolerance;Decreased balance;Decreased mobility;Decreased coordination;Decreased safety awareness;Cardiopulmonary status limiting activity;Obesity;Decreased skin integrity;Pain;Decreased cognition;Decreased knowledge of use of DME;Decreased knowledge of precautions       PT Treatment Interventions DME instruction;Gait training;Stair training;Functional mobility training;Therapeutic activities;Therapeutic exercise;Balance training;Neuromuscular re-education;Patient/family education    PT Goals (Current goals can be found in the Care Plan section)  Acute Rehab PT Goals Patient Stated Goal: to get some of this infection cleared up PT Goal Formulation: With patient/family Time For Goal Achievement: 12/15/16 Potential to Achieve Goals: Good Additional Goals Additional Goal #1: Assess and establish goals for OOB activities as appropriate.    Frequency Min 2X/week   Barriers to discharge Inaccessible home environment;Decreased caregiver support      Co-evaluation               AM-PAC PT "6 Clicks" Daily Activity  Outcome Measure Difficulty turning over in bed (including adjusting bedclothes, sheets and blankets)?: Total Difficulty moving from lying on back to sitting on the side of the bed? : Total Difficulty sitting down on and standing up from a chair with arms (e.g., wheelchair, bedside commode, etc,.)?: Total Help needed moving to and from a bed to chair  (including a wheelchair)?: Total Help needed walking in hospital room?: Total Help needed climbing 3-5 steps with a railing? : Total 6 Click Score: 6    End of Session Equipment Utilized During Treatment: Gait belt Activity Tolerance: Patient tolerated treatment well Patient left: in bed;with call bell/phone within reach;with nursing/sitter in room;with family/visitor present (nursing at bedside for hygiene) Nurse Communication: Mobility status PT Visit Diagnosis: Muscle weakness (generalized) (M62.81);History of falling (Z91.81);Difficulty in walking, not elsewhere classified (R26.2);Pain Pain - Right/Left: Left Pain - part of body: Shoulder    Time: 0071-2197 PT Time Calculation (min) (ACUTE ONLY): 20 min   Charges:   PT Evaluation $PT Eval Low Complexity: 1 Procedure     PT G Codes:        Tobenna Needs H. Owens Shark, PT, DPT, NCS 12/01/16, 11:01 AM [phone]

## 2016-12-01 NOTE — Progress Notes (Signed)
Director of the unit asked Ch to visit with pt. Pt's wife had requested CH's visit. CH met with pt and wife who was at the bedside. Pt was in pain, but CH was able to talk to pt and get a response to questions [facility] asked. Pt stated that he was in pain and when Ch asked if he should pray for him, he answered yes. [place] empathized with pt, prayed for pt and his wife, and will be following up pt as needed. Pt appears to be in intense pain.     12/01/16 1400  Clinical Encounter Type  Visited With Patient and family together  Visit Type Initial;Spiritual support;Other (Comment)  Referral From Nurse  Consult/Referral To Chaplain  Spiritual Encounters  Spiritual Needs Prayer;Emotional

## 2016-12-01 NOTE — Consult Note (Signed)
[facility] Nurse wound consult note Reason for Consult:ABscess to left upper back. Drain in place.  Draining copious, purulent drainage.   Wound type: I & D of abscess to left upper back Pressure Injury POA: NA Measurement: 0.2 cm x 2 cm opening with drain in place.  Wound bed: Drain in place Drainage (amount, consistency, odor) Moderate purulent drainage with musty odor.  Periwound:Erytehma and tender to touch.  Dressing procedure/placement/frequency:Cleanse wound to right upper back.  Algisite alginate dressing to absorb drainage.  Cover with silicone border foam.  Change alginate dressing daily and silicone border foam every other day and PRN saturation.  Patient is sensitive to tape.  Avoid medical adhesives when able.  Will not follow at this time.  Please re-consult if needed.  Domenic Moras RN BSN [place] Pager [phone]

## 2016-12-01 NOTE — Progress Notes (Signed)
Pharmacy Antibiotic Note  William Kennedy is a 76 y.o. male admitted on 11/30/2016 with abscess/cellulits.  Pharmacy has been consulted for vancomcyin/Zosyn dosing. Patient received vancomycin and Zosyn x 1 in ED.   Plan: Continue Zosyn EI 3.375g IV Q8hr.   Will initiate vancomycin 1500mg  IV Q12hr for goal trough of 15-20. Next schedule dose will be at 1900 on 7/10. Will obtain trough prior to 5th dose.   7/11 13:22 vancomycin dose modified due to 30 lb weight loss and improved serum creatinine. Modify to vancomycin 1.25 gm IV Q12H, predicted trough 19 mcg/mL, although creatinine clearance likely not quite at baseline. Pharmacy will continue to follow and adjust as needed to maintain trough 15 to 20 mcg/mL.   Height: 6\' 2"  (188 cm) Weight: 240 lb (108.9 kg) IBW/kg (Calculated) : 82.2  Temp (24hrs), Avg:99.6 F (37.6 C), Min:98.3 F (36.8 C), Max:101.2 F (38.4 C)   Recent Labs Lab 11/30/16 1424 11/30/16 1800 12/01/16 0430  WBC 27.8*  --  21.6*  CREATININE 1.72*  --  1.29*  LATICACIDVEN 4.1* 2.1*  --     Estimated Creatinine Clearance: 65 mL/min (A) (by C-G formula based on SCr of 1.29 mg/dL (H)).    No Known Allergies  Antimicrobials this admission: Zosyn 7/10 >>  Vancomycin 7/10 >>   Dose adjustments this admission: N/A  Microbiology results: 7/10 BCx: pending  7/10 UCx: pending   Thank you for allowing pharmacy to be a part of this patient's care.  Laural Benes, Pharm.D., BCPS Clinical Pharmacist 12/01/2016 1:22 PM

## 2016-12-01 NOTE — Progress Notes (Signed)
[facility] [place] NAME: William Kennedy    MR#:  [id number]  DATE OF BIRTH:  1941/04/24  SUBJECTIVE:  CHIEF COMPLAINT:   Chief Complaint  Patient presents with  . Hypotension  Recent admission for left upper back infection, incision and drainage and sent home with IV antibiotic. Also had C. difficile colitis so was on oral vancomycin. Brought back to emergency room by wife because of confusion and worsening for last few days. Noted to have elevated white blood cell count but he has history of CLL. Also had complain of urinary retention, Foley catheter is placed. Patient is very confused and cannot give any details to me.  REVIEW OF SYSTEMS:  Pt is confused and can not give ROS.  ROS  DRUG ALLERGIES:  No Known Allergies  VITALS:  Blood pressure 110/82, pulse (!) 102, temperature 99.2 F (37.3 C), temperature source Axillary, resp. rate (!) 22, height 6\' 2"  (1.88 m), weight 108.9 kg (240 lb), SpO2 100 %.  PHYSICAL EXAMINATION:   GENERAL:  76 y.o.-year-old patient lying in the bed , In distress due to pain EYES: Pupils equal, round, reactive to light and accommodation. No scleral icterus. Extraocular muscles intact.  HEENT: Head atraumatic, normocephalic. Oropharynx and nasopharynx clear. No oropharyngeal erythema, moist oral mucosa  NECK:  Supple, no jugular venous distention. No thyroid enlargement, no tenderness.  LUNGS: Normal breath sounds bilaterally, no wheezing, rales, rhonchi. No use of accessory muscles of respiration.  CARDIOVASCULAR: S1, S2 normal. No murmurs, rubs, or gallops.  ABDOMEN: Soft, tenderness diffusely. No rigidity or guarding. Foley catheter in place with dark urine EXTREMITIES: No pedal edema, cyanosis, or clubbing. + 2 pedal & radial pulses b/l.   NEUROLOGIC: Moves all 4 extremities but not following instructions PSYCHIATRIC: The patient is confused and restless in bed. Awake. Left upper extremity PICC line with  some redness around it. Physical Exam LABORATORY PANEL:   CBC  Recent Labs Lab 12/01/16 0430  WBC 21.6*  HGB 14.7  HCT 43.2  PLT 313   ------------------------------------------------------------------------------------------------------------------  Chemistries   Recent Labs Lab 11/30/16 1424 12/01/16 0430  NA 133* 138  K 4.5 3.8  CL 94* 102  CO2 24 27  GLUCOSE 119* 94  BUN 12 10  CREATININE 1.72* 1.29*  CALCIUM 9.8 8.9  AST 77*  --   ALT 53  --   ALKPHOS 133*  --   BILITOT 1.0  --    ------------------------------------------------------------------------------------------------------------------  Cardiac Enzymes  Recent Labs Lab 11/30/16 1424  TROPONINI <0.03   ------------------------------------------------------------------------------------------------------------------  RADIOLOGY:  Ct Head Wo Contrast  Result Date: 11/30/2016 CLINICAL DATA:  Altered mental status. EXAM: CT HEAD WITHOUT CONTRAST TECHNIQUE: Contiguous axial images were obtained from the base of the skull through the vertex without intravenous contrast. COMPARISON:  None. FINDINGS: Brain: Mild diffuse cortical atrophy is noted. Mild chronic ischemic white matter disease is noted. No mass effect or midline shift is noted. Ventricular size is within normal limits. There is no evidence of mass lesion, hemorrhage or acute infarction. Vascular: No hyperdense vessel or unexpected calcification. Skull: Normal. Negative for fracture or focal lesion. Sinuses/Orbits: No acute finding. Other: None. IMPRESSION: Mild diffuse cortical atrophy. Mild chronic ischemic white matter disease. No acute intracranial abnormality seen. Electronically Signed   By: Marijo Conception, M.D.   On: 11/30/2016 16:21   Ct Chest W Contrast  Result Date: 11/30/2016 CLINICAL DATA:  Abdominal pain and hypotension. EXAM: CT CHEST, ABDOMEN, AND PELVIS  WITH CONTRAST TECHNIQUE: Multidetector CT imaging of the chest, abdomen and  pelvis was performed following the standard protocol during bolus administration of intravenous contrast. CONTRAST:  57mL ISOVUE-300 IOPAMIDOL (ISOVUE-300) INJECTION 61% COMPARISON:  CT scan 11/08/2016 FINDINGS: CT CHEST FINDINGS Cardiovascular: The heart is normal in size. No pericardial effusion. There is tortuosity and calcification of the thoracic aorta. The branch vessels are patent. The advanced three-vessel coronary artery calcifications are noted. Mediastinum/Nodes: Small scattered mediastinal and hilar lymph nodes but no mass or overt adenopathy. The esophagus is grossly normal. Lungs/Pleura: Advanced emphysematous changes and pulmonary scarring. Significant breathing motion artifact no obvious pulmonary lesions. No acute pulmonary findings such as infiltrate, edema or effusion. No pneumothorax. The tracheobronchial tree is grossly normal. Chest wall/ Musculoskeletal: No chest wall mass, supraclavicular or axillary lymphadenopathy. A few small scattered lymph nodes are noted. The thyroid gland appears normal. Small amount of injected air is noted in the left subclavian vein and right internal jugular vein. No significant bony findings. CT ABDOMEN PELVIS FINDINGS Hepatobiliary: No focal hepatic lesions or intrahepatic biliary dilatation. Gallbladder is normal. No common bile duct dilatation. Pancreas: No mass, inflammation or ductal dilatation. Spleen: Normal size.  No focal lesions. Adrenals/Urinary Tract: Stable large rim calcified right adrenal gland cyst. The left adrenal gland is unremarkable. Stable scarring changes involving the left kidney. A small left renal calculus is noted. The left-sided hydroureteronephrosis, seen on the prior study, has resolved. Stable large right renal cyst. No right-sided renal or ureteral calculi. No bladder calculi. Moderate symmetric bladder wall thickening likely due to bladder outlet obstruction from a markedly enlarged prostate gland. Stomach/Bowel: The stomach,  duodenum and small bowel are unremarkable. The terminal ileum is normal. The appendix is normal. Diffuse descending and sigmoid diverticulosis with acute diverticulitis involving the sigmoid colon. There is a large inflamed diverticuli also seen on the prior study with some surrounding inflammation. No complicating features such as free air or abscess. Vascular/Lymphatic: Stable advanced atherosclerotic calcifications involving the abdominal aorta and iliac arteries. The branch vessels are patent. Small caliber left renal artery with ostial calcifications bilaterally. New line no mesenteric or retroperitoneal lymphadenopathy. Small scattered lymph nodes are stable. There are also small external iliac lymph nodes which are stable. Reproductive: Markedly enlarged prostate gland. The seminal vesicles appear normal. Other: No free pelvic fluid collection or inguinal adenopathy. Musculoskeletal: No significant bony findings. Advanced degenerative changes involving the lumbar spine. IMPRESSION: 1. Persistent or recurrent sigmoid diverticulitis without complicating features such as abscess or free air. 2. No acute or significant pulmonary findings. 3. Stable large rim calcified right adrenal gland cyst. 4. Resolution of left ureteral calculus. Small left renal calculus noted. 5. Markedly enlarged prostate gland with bladder outlet obstruction and moderate uniform bladder wall thickening. Electronically Signed   By: Marijo Sanes M.D.   On: 11/30/2016 16:29   Ct Abdomen Pelvis W Contrast  Result Date: 11/30/2016 CLINICAL DATA:  Abdominal pain and hypotension. EXAM: CT CHEST, ABDOMEN, AND PELVIS WITH CONTRAST TECHNIQUE: Multidetector CT imaging of the chest, abdomen and pelvis was performed following the standard protocol during bolus administration of intravenous contrast. CONTRAST:  62mL ISOVUE-300 IOPAMIDOL (ISOVUE-300) INJECTION 61% COMPARISON:  CT scan 11/08/2016 FINDINGS: CT CHEST FINDINGS Cardiovascular: The heart  is normal in size. No pericardial effusion. There is tortuosity and calcification of the thoracic aorta. The branch vessels are patent. The advanced three-vessel coronary artery calcifications are noted. Mediastinum/Nodes: Small scattered mediastinal and hilar lymph nodes but no mass or overt adenopathy. The esophagus  is grossly normal. Lungs/Pleura: Advanced emphysematous changes and pulmonary scarring. Significant breathing motion artifact no obvious pulmonary lesions. No acute pulmonary findings such as infiltrate, edema or effusion. No pneumothorax. The tracheobronchial tree is grossly normal. Chest wall/ Musculoskeletal: No chest wall mass, supraclavicular or axillary lymphadenopathy. A few small scattered lymph nodes are noted. The thyroid gland appears normal. Small amount of injected air is noted in the left subclavian vein and right internal jugular vein. No significant bony findings. CT ABDOMEN PELVIS FINDINGS Hepatobiliary: No focal hepatic lesions or intrahepatic biliary dilatation. Gallbladder is normal. No common bile duct dilatation. Pancreas: No mass, inflammation or ductal dilatation. Spleen: Normal size.  No focal lesions. Adrenals/Urinary Tract: Stable large rim calcified right adrenal gland cyst. The left adrenal gland is unremarkable. Stable scarring changes involving the left kidney. A small left renal calculus is noted. The left-sided hydroureteronephrosis, seen on the prior study, has resolved. Stable large right renal cyst. No right-sided renal or ureteral calculi. No bladder calculi. Moderate symmetric bladder wall thickening likely due to bladder outlet obstruction from a markedly enlarged prostate gland. Stomach/Bowel: The stomach, duodenum and small bowel are unremarkable. The terminal ileum is normal. The appendix is normal. Diffuse descending and sigmoid diverticulosis with acute diverticulitis involving the sigmoid colon. There is a large inflamed diverticuli also seen on the prior  study with some surrounding inflammation. No complicating features such as free air or abscess. Vascular/Lymphatic: Stable advanced atherosclerotic calcifications involving the abdominal aorta and iliac arteries. The branch vessels are patent. Small caliber left renal artery with ostial calcifications bilaterally. New line no mesenteric or retroperitoneal lymphadenopathy. Small scattered lymph nodes are stable. There are also small external iliac lymph nodes which are stable. Reproductive: Markedly enlarged prostate gland. The seminal vesicles appear normal. Other: No free pelvic fluid collection or inguinal adenopathy. Musculoskeletal: No significant bony findings. Advanced degenerative changes involving the lumbar spine. IMPRESSION: 1. Persistent or recurrent sigmoid diverticulitis without complicating features such as abscess or free air. 2. No acute or significant pulmonary findings. 3. Stable large rim calcified right adrenal gland cyst. 4. Resolution of left ureteral calculus. Small left renal calculus noted. 5. Markedly enlarged prostate gland with bladder outlet obstruction and moderate uniform bladder wall thickening. Electronically Signed   By: Marijo Sanes M.D.   On: 11/30/2016 16:29   Dg Chest Port 1 View  Result Date: 11/30/2016 CLINICAL DATA:  Pt brought in for hypotension. He has an abcess on the left posterior shoulder and has a picc line. Hx of leukemia. Former smoker EXAM: PORTABLE CHEST 1 VIEW COMPARISON:  Chest x-ray dated 11/05/2016. Chest x-ray dated 04/02/2010. FINDINGS: Right-sided PICC line in place with tip adequately positioned at the level of the mid/lower SVC. Heart size is upper normal, stable. Overall cardiomediastinal silhouette is stable. Coarse lung markings bilaterally suggests chronic interstitial lung disease. No new lung findings. No pleural effusion or pneumothorax seen. Osseous structures about the chest are unremarkable. Vague lucency within the soft tissues about the  left shoulder is presumably related to patient's known soft tissue abscess. IMPRESSION: No active disease.  No evidence of pneumonia or pulmonary edema. Probable chronic interstitial lung disease. Electronically Signed   By: Franki Cabot M.D.   On: 11/30/2016 14:07    ASSESSMENT AND PLAN:   Active Problems:   Sepsis ([place])   * Severe sepsis with encephalopathy. Critically ill. Multiple etiologies are upper back abscess with purulent drainage and surrounding cellulitis and sigmoid diverticulitis. He also has ongoing diarrhea with recent C.  difficile infection. Patient is on IV ceftriaxone at home. PICC line in place. Need to rule out bacteremia. Blood cultures sent and negative so far. Fluid bolus per sepsis protocol and repeat lactic acid.   broad-spectrum antibiotics with vancomycin and Zosyn while waiting for cultures. Also oral vancomycin. Negative C. difficile stool studies. Consult surgery Dr. Tamala Julian for his upper back abscess. May need repeat I&D.   consult infectious disease due to sepsis while being on IV antibiotics at home.  * Upper back abscess- see above  * Sigmoid diverticulitis with possible C. difficile. See above.  * Acute toxic encephalopathy due to sepsis. CT scan of the head negative. Should improve as his infection improves.  * Acute kidney injury due to severe sepsis and bladder outlet obstruction. Should improve with IV fluids. Monitor input and output. Repeat labs in the morning.  * CLL with chronic leukocytosis.  * Hypertension. 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 No UTI. Start Flomax. Foley catheter placed and will continue. No hydronephrosis.  Was seen by urologist in past, call consult.  * DVT prophylaxis with Lovenox    All the records are reviewed and case discussed with Care Management/Social Workerr. Management plans discussed with the patient, family and they are in agreement.  CODE STATUS:  full.  TOTAL TIME TAKING CARE OF THIS PATIENT: 35 minutes.     POSSIBLE D/C IN 2-3 DAYS, DEPENDING ON CLINICAL CONDITION.   Vaughan Basta M.D on 12/01/2016   Between 7am to 6pm - Pager - [phone]  After 6pm go to [email or web address] - password [email or web address]  Office  [phone]  CC: Primary care physician; [name], MD  Note: This dictation was prepared with Dragon dictation along with smaller phrase technology. Any transcriptional errors that result from this process are unintentional.

## 2016-12-01 NOTE — Consult Note (Signed)
Urology Consult  I have been asked to see the patient by Dr. Erlene Quan, for evaluation and management of urinary retention.  Chief Complaint: Urinary retention  History of Present Illness: William Kennedy is a 76 y.o. year old male well known to the urological service status post recent treatment of the left ureteral stone  (failed ESWL on 6/14 and left URS on 8/16) on is readmitted to the medical service with ongoing MSSA abscess of the back. Upon admission, he was very altered and encephalopathic from his ongoing infection. Additionally, he was unable to void and a Foley catheter was placed. He was started on Flomax.  CT scan on admission yesterday showed resolution of his stone and hydronephrosis. He does have a small punctate left nonobstructing renal calculus. He has known massive marked BPH with uniform bladder wall thickening.   Past Medical History:  Diagnosis Date  . Abscess    shoulder  . Leukemia [facility])     Past Surgical History:  Procedure Laterality Date  . BACK SURGERY    . CYSTOSCOPY WITH URETEROSCOPY AND STENT PLACEMENT Left 11/06/2016   Procedure: CYSTOSCOPY WITH URETEROSCOPY AND STENT PLACEMENT retrograde pyelogram,holmium laser;  Surgeon: Irine Seal, MD;  Location: [facility] ORS;  Service: Urology;  Laterality: Left;  . EXTRACORPOREAL SHOCK WAVE LITHOTRIPSY Left 11/04/2016   Procedure: EXTRACORPOREAL SHOCK WAVE LITHOTRIPSY (ESWL);  Surgeon: Hollice Espy, MD;  Location: [facility] ORS;  Service: Urology;  Laterality: Left;    Home Medications:  Current Meds  Medication Sig  . cefTRIAXone 1 g in dextrose 5 % 50 mL Inject 1 g into the vein daily.    Allergies: No Known Allergies  Family History  Problem Relation Age of Onset  . Diabetes Mother   . Cancer Father     Social History:  reports that he has quit smoking. He has quit using smokeless tobacco. He reports that he does not drink alcohol or use drugs.  ROS: Unable to perform review of systems due to  patient's mental status  Physical Exam:  Vital signs in last 24 hours: Temp:  [99.2 F (37.3 C)-101.2 F (38.4 C)] 99.2 F (37.3 C) (07/11 0035) Pulse Rate:  [97-126] 102 (07/11 1018) Resp:  [20-22] 22 (07/11 0035) BP: (108-154)/(71-102) 110/82 (07/11 1018) SpO2:  [100 %] 100 % (07/11 0035) Weight:  [240 lb (108.9 kg)] 240 lb (108.9 kg) (07/11 0428) Constitutional:  Disheveled. A week. Wife at bedside. Wearing mittens. HEENT: [place] AT, moist mucus membranes.  Trachea midline, no masses Cardiovascular: No clubbing, cyanosis, or edema. Respiratory: Normal respiratory effort. GI: Abdomen is soft, nontender, nondistended.  OBese.   GU: Foley catheter draining clear yellow urine Skin: No rashes, bruises or suspicious lesions Neurologic: Confused   Laboratory Data:   Recent Labs  11/30/16 1424 12/01/16 0430  WBC 27.8* 21.6*  HGB 15.8 14.7  HCT 47.0 43.2    Recent Labs  11/30/16 1424 12/01/16 0430  NA 133* 138  K 4.5 3.8  CL 94* 102  CO2 24 27  GLUCOSE 119* 94  BUN 12 10  CREATININE 1.72* 1.29*  CALCIUM 9.8 8.9   No results for input(s): LABPT, INR in the last 72 hours. No results for input(s): LABURIN in the last 72 hours. Results for orders placed or performed during the hospital encounter of 11/30/16  Blood Culture (routine x 2)     Status: None (Preliminary result)   Collection Time: 11/30/16  2:43 PM  Result Value Ref Range Status  Specimen Description BLOOD BLOOD LEFT HAND  Final   Special Requests   Final    BOTTLES DRAWN AEROBIC AND ANAEROBIC Blood Culture results may not be optimal due to an inadequate volume of blood received in culture bottles   Culture NO GROWTH < 24 HOURS  Final   Report Status PENDING  Incomplete  Blood Culture (routine x 2)     Status: None (Preliminary result)   Collection Time: 11/30/16  3:17 PM  Result Value Ref Range Status   Specimen Description BLOOD BLOOD LEFT FOREARM  Final   Special Requests   Final    BOTTLES DRAWN  AEROBIC AND ANAEROBIC Blood Culture adequate volume   Culture NO GROWTH < 24 HOURS  Final   Report Status PENDING  Incomplete  C difficile quick scan w PCR reflex     Status: None   Collection Time: 12/01/16  9:15 AM  Result Value Ref Range Status   C Diff antigen NEGATIVE NEGATIVE Final   C Diff toxin NEGATIVE NEGATIVE Final   C Diff interpretation No C. difficile detected.  Final     Radiologic Imaging: Ct Head Wo Contrast  Result Date: 11/30/2016 CLINICAL DATA:  Altered mental status. EXAM: CT HEAD WITHOUT CONTRAST TECHNIQUE: Contiguous axial images were obtained from the base of the skull through the vertex without intravenous contrast. COMPARISON:  None. FINDINGS: Brain: Mild diffuse cortical atrophy is noted. Mild chronic ischemic white matter disease is noted. No mass effect or midline shift is noted. Ventricular size is within normal limits. There is no evidence of mass lesion, hemorrhage or acute infarction. Vascular: No hyperdense vessel or unexpected calcification. Skull: Normal. Negative for fracture or focal lesion. Sinuses/Orbits: No acute finding. Other: None. IMPRESSION: Mild diffuse cortical atrophy. Mild chronic ischemic white matter disease. No acute intracranial abnormality seen. Electronically Signed   By: Marijo Conception, M.D.   On: 11/30/2016 16:21   Ct Chest W Contrast  Result Date: 11/30/2016 CLINICAL DATA:  Abdominal pain and hypotension. EXAM: CT CHEST, ABDOMEN, AND PELVIS WITH CONTRAST TECHNIQUE: Multidetector CT imaging of the chest, abdomen and pelvis was performed following the standard protocol during bolus administration of intravenous contrast. CONTRAST:  28mL ISOVUE-300 IOPAMIDOL (ISOVUE-300) INJECTION 61% COMPARISON:  CT scan 11/08/2016 FINDINGS: CT CHEST FINDINGS Cardiovascular: The heart is normal in size. No pericardial effusion. There is tortuosity and calcification of the thoracic aorta. The branch vessels are patent. The advanced three-vessel coronary  artery calcifications are noted. Mediastinum/Nodes: Small scattered mediastinal and hilar lymph nodes but no mass or overt adenopathy. The esophagus is grossly normal. Lungs/Pleura: Advanced emphysematous changes and pulmonary scarring. Significant breathing motion artifact no obvious pulmonary lesions. No acute pulmonary findings such as infiltrate, edema or effusion. No pneumothorax. The tracheobronchial tree is grossly normal. Chest wall/ Musculoskeletal: No chest wall mass, supraclavicular or axillary lymphadenopathy. A few small scattered lymph nodes are noted. The thyroid gland appears normal. Small amount of injected air is noted in the left subclavian vein and right internal jugular vein. No significant bony findings. CT ABDOMEN PELVIS FINDINGS Hepatobiliary: No focal hepatic lesions or intrahepatic biliary dilatation. Gallbladder is normal. No common bile duct dilatation. Pancreas: No mass, inflammation or ductal dilatation. Spleen: Normal size.  No focal lesions. Adrenals/Urinary Tract: Stable large rim calcified right adrenal gland cyst. The left adrenal gland is unremarkable. Stable scarring changes involving the left kidney. A small left renal calculus is noted. The left-sided hydroureteronephrosis, seen on the prior study, has resolved. Stable large right renal  cyst. No right-sided renal or ureteral calculi. No bladder calculi. Moderate symmetric bladder wall thickening likely due to bladder outlet obstruction from a markedly enlarged prostate gland. Stomach/Bowel: The stomach, duodenum and small bowel are unremarkable. The terminal ileum is normal. The appendix is normal. Diffuse descending and sigmoid diverticulosis with acute diverticulitis involving the sigmoid colon. There is a large inflamed diverticuli also seen on the prior study with some surrounding inflammation. No complicating features such as free air or abscess. Vascular/Lymphatic: Stable advanced atherosclerotic calcifications involving  the abdominal aorta and iliac arteries. The branch vessels are patent. Small caliber left renal artery with ostial calcifications bilaterally. New line no mesenteric or retroperitoneal lymphadenopathy. Small scattered lymph nodes are stable. There are also small external iliac lymph nodes which are stable. Reproductive: Markedly enlarged prostate gland. The seminal vesicles appear normal. Other: No free pelvic fluid collection or inguinal adenopathy. Musculoskeletal: No significant bony findings. Advanced degenerative changes involving the lumbar spine. IMPRESSION: 1. Persistent or recurrent sigmoid diverticulitis without complicating features such as abscess or free air. 2. No acute or significant pulmonary findings. 3. Stable large rim calcified right adrenal gland cyst. 4. Resolution of left ureteral calculus. Small left renal calculus noted. 5. Markedly enlarged prostate gland with bladder outlet obstruction and moderate uniform bladder wall thickening. Electronically Signed   By: Marijo Sanes M.D.   On: 11/30/2016 16:29   Ct Abdomen Pelvis W Contrast  Result Date: 11/30/2016 CLINICAL DATA:  Abdominal pain and hypotension. EXAM: CT CHEST, ABDOMEN, AND PELVIS WITH CONTRAST TECHNIQUE: Multidetector CT imaging of the chest, abdomen and pelvis was performed following the standard protocol during bolus administration of intravenous contrast. CONTRAST:  1mL ISOVUE-300 IOPAMIDOL (ISOVUE-300) INJECTION 61% COMPARISON:  CT scan 11/08/2016 FINDINGS: CT CHEST FINDINGS Cardiovascular: The heart is normal in size. No pericardial effusion. There is tortuosity and calcification of the thoracic aorta. The branch vessels are patent. The advanced three-vessel coronary artery calcifications are noted. Mediastinum/Nodes: Small scattered mediastinal and hilar lymph nodes but no mass or overt adenopathy. The esophagus is grossly normal. Lungs/Pleura: Advanced emphysematous changes and pulmonary scarring. Significant breathing  motion artifact no obvious pulmonary lesions. No acute pulmonary findings such as infiltrate, edema or effusion. No pneumothorax. The tracheobronchial tree is grossly normal. Chest wall/ Musculoskeletal: No chest wall mass, supraclavicular or axillary lymphadenopathy. A few small scattered lymph nodes are noted. The thyroid gland appears normal. Small amount of injected air is noted in the left subclavian vein and right internal jugular vein. No significant bony findings. CT ABDOMEN PELVIS FINDINGS Hepatobiliary: No focal hepatic lesions or intrahepatic biliary dilatation. Gallbladder is normal. No common bile duct dilatation. Pancreas: No mass, inflammation or ductal dilatation. Spleen: Normal size.  No focal lesions. Adrenals/Urinary Tract: Stable large rim calcified right adrenal gland cyst. The left adrenal gland is unremarkable. Stable scarring changes involving the left kidney. A small left renal calculus is noted. The left-sided hydroureteronephrosis, seen on the prior study, has resolved. Stable large right renal cyst. No right-sided renal or ureteral calculi. No bladder calculi. Moderate symmetric bladder wall thickening likely due to bladder outlet obstruction from a markedly enlarged prostate gland. Stomach/Bowel: The stomach, duodenum and small bowel are unremarkable. The terminal ileum is normal. The appendix is normal. Diffuse descending and sigmoid diverticulosis with acute diverticulitis involving the sigmoid colon. There is a large inflamed diverticuli also seen on the prior study with some surrounding inflammation. No complicating features such as free air or abscess. Vascular/Lymphatic: Stable advanced atherosclerotic calcifications involving the abdominal  aorta and iliac arteries. The branch vessels are patent. Small caliber left renal artery with ostial calcifications bilaterally. New line no mesenteric or retroperitoneal lymphadenopathy. Small scattered lymph nodes are stable. There are also  small external iliac lymph nodes which are stable. Reproductive: Markedly enlarged prostate gland. The seminal vesicles appear normal. Other: No free pelvic fluid collection or inguinal adenopathy. Musculoskeletal: No significant bony findings. Advanced degenerative changes involving the lumbar spine. IMPRESSION: 1. Persistent or recurrent sigmoid diverticulitis without complicating features such as abscess or free air. 2. No acute or significant pulmonary findings. 3. Stable large rim calcified right adrenal gland cyst. 4. Resolution of left ureteral calculus. Small left renal calculus noted. 5. Markedly enlarged prostate gland with bladder outlet obstruction and moderate uniform bladder wall thickening. Electronically Signed   By: Marijo Sanes M.D.   On: 11/30/2016 16:29   Dg Chest Port 1 View  Result Date: 11/30/2016 CLINICAL DATA:  Pt brought in for hypotension. He has an abcess on the left posterior shoulder and has a picc line. Hx of leukemia. Former smoker EXAM: PORTABLE CHEST 1 VIEW COMPARISON:  Chest x-ray dated 11/05/2016. Chest x-ray dated 04/02/2010. FINDINGS: Right-sided PICC line in place with tip adequately positioned at the level of the mid/lower SVC. Heart size is upper normal, stable. Overall cardiomediastinal silhouette is stable. Coarse lung markings bilaterally suggests chronic interstitial lung disease. No new lung findings. No pleural effusion or pneumothorax seen. Osseous structures about the chest are unremarkable. Vague lucency within the soft tissues about the left shoulder is presumably related to patient's known soft tissue abscess. IMPRESSION: No active disease.  No evidence of pneumonia or pulmonary edema. Probable chronic interstitial lung disease. Electronically Signed   By: Franki Cabot M.D.   On: 11/30/2016 14:07   CT scan personally reviewed today.  Impression/Plan:  1) urinary retention/ BPH with massive prostate-  Urinary retention likely multifactorial insetting of  encephalopathy, massive BPH.  Recommend initiation of Flomax and finasteride.  Maintain Foley catheter form one week.  Outpatient follow-up has been arranged for next week with Zara Council, will arrange for voiding trial at that time.    2) History of left ureteral stone-  S/p treatment, no residual hydroureteronephrosis on CT scan No need for follow-up renal ultrasound previous is scheduled for tomorrow as CT scan suffices  3)  Microscopic hematuria- Incidental microscopic hematuria on urinalysis on admission. Suspect this may be related to recent ureteroscopic manipulation or massive BPH. Cystoscopy at the time of ureteroscopy was unremarkable other than for BPH.   Urology will sign off. Please page with any questions or concerns.  12/01/2016, 5:45 PM  Hollice Espy,  MD

## 2016-12-02 ENCOUNTER — Inpatient Hospital Stay: Payer: BLUE CROSS/BLUE SHIELD

## 2016-12-02 ENCOUNTER — Ambulatory Visit: Admission: RE | Admit: 2016-12-02 | Payer: BLUE CROSS/BLUE SHIELD | Source: Ambulatory Visit

## 2016-12-02 ENCOUNTER — Telehealth: Payer: Self-pay | Admitting: Urology

## 2016-12-02 LAB — URINE CULTURE: CULTURE: NO GROWTH

## 2016-12-02 MED ORDER — BELLADONNA ALKALOIDS-OPIUM 16.2-60 MG RE SUPP
1.0000 | Freq: Three times a day (TID) | RECTAL | Status: DC | PRN
  Administered 2016-12-02 – 2016-12-03 (×3): 1 via RECTAL
  Filled 2016-12-02 (×3): qty 1

## 2016-12-02 MED ORDER — ENSURE ENLIVE PO LIQD
237.0000 mL | Freq: Three times a day (TID) | ORAL | Status: DC
  Administered 2016-12-02 – 2016-12-06 (×11): 237 mL via ORAL

## 2016-12-02 MED ORDER — ENSURE ENLIVE PO LIQD
237.0000 mL | Freq: Two times a day (BID) | ORAL | Status: DC
  Administered 2016-12-02: 237 mL via ORAL

## 2016-12-02 NOTE — Telephone Encounter (Signed)
-----   Message from Hollice Espy, MD sent at 12/02/2016  9:33 AM EDT ----- Regarding: RE: f/u  No KUB.  If can move to morning, then please do.  If not, then please remove.    Hollice Espy, MD  ----- Message ----- From: Benard Halsted Sent: 12/02/2016   9:09 AM To: Hollice Espy, MD Subject: RE: f/u                                        He is one the schedule at 2:15 does it need to be moved to the morning and does he still need the KUB? ----- Message ----- From: Hollice Espy, MD Sent: 12/01/2016   5:58 PM To: Rowe Robert Admin Subject: f/u                                            This patient has an appointment already scheduled with [facility] for next week. Could you please change the message on the appointment type to follow-up urinary retention/ voiding trial? He no longer needs a renal ultrasound.  Hollice Espy, MD

## 2016-12-02 NOTE — Progress Notes (Signed)
[name]    MR#:  [id number]  DATE OF BIRTH:  08-Feb-1941  SUBJECTIVE:  CHIEF COMPLAINT:   Chief Complaint  Patient presents with  . Hypotension  Recent admission for left upper back infection, incision and drainage and sent home with IV antibiotic. Also had C. difficile colitis so was on oral vancomycin. Brought back to emergency room by wife because of confusion and worsening for last few days. Noted to have elevated white blood cell count but he has history of CLL. Also had complain of urinary retention, Foley catheter is placed. Patient is very confused and cannot give any details to me. He is repeatedly showing signs of catheter hurting him and trying to reach there- but have mittens on hands.  REVIEW OF SYSTEMS:  Pt is confused and can not give ROS.  ROS  DRUG ALLERGIES:  No Known Allergies  VITALS:  Blood pressure 125/74, pulse 92, temperature 98.5 F (36.9 C), temperature source Oral, resp. rate 18, height 6\' 2"  (1.88 m), weight 108.6 kg (239 lb 6.7 oz), SpO2 95 %.  PHYSICAL EXAMINATION:   GENERAL:  76 y.o.-year-old patient lying in the bed , In distress due to pain EYES: Pupils equal, round, reactive to light and accommodation. No scleral icterus. Extraocular muscles intact.  HEENT: Head atraumatic, normocephalic. Oropharynx and nasopharynx clear. No oropharyngeal erythema, moist oral mucosa  NECK:  Supple, no jugular venous distention. No thyroid enlargement, no tenderness.  LUNGS: Normal breath sounds bilaterally, no wheezing, rales, rhonchi. No use of accessory muscles of respiration.  CARDIOVASCULAR: S1, S2 normal. No murmurs, rubs, or gallops.  ABDOMEN: Soft, tenderness diffusely. No rigidity or guarding. Foley catheter in place with dark urine EXTREMITIES: No pedal edema, cyanosis, or clubbing. + 2 pedal & radial pulses b/l.   NEUROLOGIC: Moves all 4 extremities but not following  instructions PSYCHIATRIC: The patient is confused and restless in bed. Awake. Mittens in hands. On left upper back, large cellulitis area with redness.  Physical Exam LABORATORY PANEL:   CBC  Recent Labs Lab 12/01/16 0430  WBC 21.6*  HGB 14.7  HCT 43.2  PLT 313   ------------------------------------------------------------------------------------------------------------------  Chemistries   Recent Labs Lab 11/30/16 1424 12/01/16 0430  NA 133* 138  K 4.5 3.8  CL 94* 102  CO2 24 27  GLUCOSE 119* 94  BUN 12 10  CREATININE 1.72* 1.29*  CALCIUM 9.8 8.9  AST 77*  --   ALT 53  --   ALKPHOS 133*  --   BILITOT 1.0  --    ------------------------------------------------------------------------------------------------------------------  Cardiac Enzymes  Recent Labs Lab 11/30/16 1424  TROPONINI <0.03   ------------------------------------------------------------------------------------------------------------------  RADIOLOGY:  Ct Head Wo Contrast  Result Date: 11/30/2016 CLINICAL DATA:  Altered mental status. EXAM: CT HEAD WITHOUT CONTRAST TECHNIQUE: Contiguous axial images were obtained from the base of the skull through the vertex without intravenous contrast. COMPARISON:  None. FINDINGS: Brain: Mild diffuse cortical atrophy is noted. Mild chronic ischemic white matter disease is noted. No mass effect or midline shift is noted. Ventricular size is within normal limits. There is no evidence of mass lesion, hemorrhage or acute infarction. Vascular: No hyperdense vessel or unexpected calcification. Skull: Normal. Negative for fracture or focal lesion. Sinuses/Orbits: No acute finding. Other: None. IMPRESSION: Mild diffuse cortical atrophy. Mild chronic ischemic white matter disease. No acute intracranial abnormality seen. Electronically Signed   By: Marijo Conception, M.D.   On: 11/30/2016 16:21  Ct Chest W Contrast  Result Date: 11/30/2016 CLINICAL DATA:  Abdominal pain  and hypotension. EXAM: CT CHEST, ABDOMEN, AND PELVIS WITH CONTRAST TECHNIQUE: Multidetector CT imaging of the chest, abdomen and pelvis was performed following the standard protocol during bolus administration of intravenous contrast. CONTRAST:  43mL ISOVUE-300 IOPAMIDOL (ISOVUE-300) INJECTION 61% COMPARISON:  CT scan 11/08/2016 FINDINGS: CT CHEST FINDINGS Cardiovascular: The heart is normal in size. No pericardial effusion. There is tortuosity and calcification of the thoracic aorta. The branch vessels are patent. The advanced three-vessel coronary artery calcifications are noted. Mediastinum/Nodes: Small scattered mediastinal and hilar lymph nodes but no mass or overt adenopathy. The esophagus is grossly normal. Lungs/Pleura: Advanced emphysematous changes and pulmonary scarring. Significant breathing motion artifact no obvious pulmonary lesions. No acute pulmonary findings such as infiltrate, edema or effusion. No pneumothorax. The tracheobronchial tree is grossly normal. Chest wall/ Musculoskeletal: No chest wall mass, supraclavicular or axillary lymphadenopathy. A few small scattered lymph nodes are noted. The thyroid gland appears normal. Small amount of injected air is noted in the left subclavian vein and right internal jugular vein. No significant bony findings. CT ABDOMEN PELVIS FINDINGS Hepatobiliary: No focal hepatic lesions or intrahepatic biliary dilatation. Gallbladder is normal. No common bile duct dilatation. Pancreas: No mass, inflammation or ductal dilatation. Spleen: Normal size.  No focal lesions. Adrenals/Urinary Tract: Stable large rim calcified right adrenal gland cyst. The left adrenal gland is unremarkable. Stable scarring changes involving the left kidney. A small left renal calculus is noted. The left-sided hydroureteronephrosis, seen on the prior study, has resolved. Stable large right renal cyst. No right-sided renal or ureteral calculi. No bladder calculi. Moderate symmetric bladder  wall thickening likely due to bladder outlet obstruction from a markedly enlarged prostate gland. Stomach/Bowel: The stomach, duodenum and small bowel are unremarkable. The terminal ileum is normal. The appendix is normal. Diffuse descending and sigmoid diverticulosis with acute diverticulitis involving the sigmoid colon. There is a large inflamed diverticuli also seen on the prior study with some surrounding inflammation. No complicating features such as free air or abscess. Vascular/Lymphatic: Stable advanced atherosclerotic calcifications involving the abdominal aorta and iliac arteries. The branch vessels are patent. Small caliber left renal artery with ostial calcifications bilaterally. New line no mesenteric or retroperitoneal lymphadenopathy. Small scattered lymph nodes are stable. There are also small external iliac lymph nodes which are stable. Reproductive: Markedly enlarged prostate gland. The seminal vesicles appear normal. Other: No free pelvic fluid collection or inguinal adenopathy. Musculoskeletal: No significant bony findings. Advanced degenerative changes involving the lumbar spine. IMPRESSION: 1. Persistent or recurrent sigmoid diverticulitis without complicating features such as abscess or free air. 2. No acute or significant pulmonary findings. 3. Stable large rim calcified right adrenal gland cyst. 4. Resolution of left ureteral calculus. Small left renal calculus noted. 5. Markedly enlarged prostate gland with bladder outlet obstruction and moderate uniform bladder wall thickening. Electronically Signed   By: Marijo Sanes M.D.   On: 11/30/2016 16:29   Ct Abdomen Pelvis W Contrast  Result Date: 11/30/2016 CLINICAL DATA:  Abdominal pain and hypotension. EXAM: CT CHEST, ABDOMEN, AND PELVIS WITH CONTRAST TECHNIQUE: Multidetector CT imaging of the chest, abdomen and pelvis was performed following the standard protocol during bolus administration of intravenous contrast. CONTRAST:  36mL  ISOVUE-300 IOPAMIDOL (ISOVUE-300) INJECTION 61% COMPARISON:  CT scan 11/08/2016 FINDINGS: CT CHEST FINDINGS Cardiovascular: The heart is normal in size. No pericardial effusion. There is tortuosity and calcification of the thoracic aorta. The branch vessels are patent. The advanced three-vessel  coronary artery calcifications are noted. Mediastinum/Nodes: Small scattered mediastinal and hilar lymph nodes but no mass or overt adenopathy. The esophagus is grossly normal. Lungs/Pleura: Advanced emphysematous changes and pulmonary scarring. Significant breathing motion artifact no obvious pulmonary lesions. No acute pulmonary findings such as infiltrate, edema or effusion. No pneumothorax. The tracheobronchial tree is grossly normal. Chest wall/ Musculoskeletal: No chest wall mass, supraclavicular or axillary lymphadenopathy. A few small scattered lymph nodes are noted. The thyroid gland appears normal. Small amount of injected air is noted in the left subclavian vein and right internal jugular vein. No significant bony findings. CT ABDOMEN PELVIS FINDINGS Hepatobiliary: No focal hepatic lesions or intrahepatic biliary dilatation. Gallbladder is normal. No common bile duct dilatation. Pancreas: No mass, inflammation or ductal dilatation. Spleen: Normal size.  No focal lesions. Adrenals/Urinary Tract: Stable large rim calcified right adrenal gland cyst. The left adrenal gland is unremarkable. Stable scarring changes involving the left kidney. A small left renal calculus is noted. The left-sided hydroureteronephrosis, seen on the prior study, has resolved. Stable large right renal cyst. No right-sided renal or ureteral calculi. No bladder calculi. Moderate symmetric bladder wall thickening likely due to bladder outlet obstruction from a markedly enlarged prostate gland. Stomach/Bowel: The stomach, duodenum and small bowel are unremarkable. The terminal ileum is normal. The appendix is normal. Diffuse descending and sigmoid  diverticulosis with acute diverticulitis involving the sigmoid colon. There is a large inflamed diverticuli also seen on the prior study with some surrounding inflammation. No complicating features such as free air or abscess. Vascular/Lymphatic: Stable advanced atherosclerotic calcifications involving the abdominal aorta and iliac arteries. The branch vessels are patent. Small caliber left renal artery with ostial calcifications bilaterally. New line no mesenteric or retroperitoneal lymphadenopathy. Small scattered lymph nodes are stable. There are also small external iliac lymph nodes which are stable. Reproductive: Markedly enlarged prostate gland. The seminal vesicles appear normal. Other: No free pelvic fluid collection or inguinal adenopathy. Musculoskeletal: No significant bony findings. Advanced degenerative changes involving the lumbar spine. IMPRESSION: 1. Persistent or recurrent sigmoid diverticulitis without complicating features such as abscess or free air. 2. No acute or significant pulmonary findings. 3. Stable large rim calcified right adrenal gland cyst. 4. Resolution of left ureteral calculus. Small left renal calculus noted. 5. Markedly enlarged prostate gland with bladder outlet obstruction and moderate uniform bladder wall thickening. Electronically Signed   By: Marijo Sanes M.D.   On: 11/30/2016 16:29   Dg Chest Port 1 View  Result Date: 11/30/2016 CLINICAL DATA:  Pt brought in for hypotension. He has an abcess on the left posterior shoulder and has a picc line. Hx of leukemia. Former smoker EXAM: PORTABLE CHEST 1 VIEW COMPARISON:  Chest x-ray dated 11/05/2016. Chest x-ray dated 04/02/2010. FINDINGS: Right-sided PICC line in place with tip adequately positioned at the level of the mid/lower SVC. Heart size is upper normal, stable. Overall cardiomediastinal silhouette is stable. Coarse lung markings bilaterally suggests chronic interstitial lung disease. No new lung findings. No pleural  effusion or pneumothorax seen. Osseous structures about the chest are unremarkable. Vague lucency within the soft tissues about the left shoulder is presumably related to patient's known soft tissue abscess. IMPRESSION: No active disease.  No evidence of pneumonia or pulmonary edema. Probable chronic interstitial lung disease. Electronically Signed   By: Franki Cabot M.D.   On: 11/30/2016 14:07    ASSESSMENT AND PLAN:   Active Problems:   Sepsis ([place])   Urinary retention   Microscopic hematuria   *  Severe sepsis with encephalopathy. Critically ill. Multiple etiologies are upper back abscess with purulent drainage and surrounding cellulitis and sigmoid diverticulitis. He also has ongoing diarrhea with recent C. difficile infection. Patient is on IV ceftriaxone at home. PICC line in place. Need to rule out bacteremia. Blood cultures sent and negative so far. Fluid bolus per sepsis protocol and repeat lactic acid.   broad-spectrum antibiotics with vancomycin and Zosyn while waiting for cultures. Also oral vancomycin. Negative C. difficile stool studies. Consult surgery Dr. Tamala Julian for his upper back abscess. May need repeat I&D.   consult infectious disease due to sepsis while being on IV antibiotics at home.  * Upper back abscess- see above   Waiting for surgery consult.  * Sigmoid diverticulitis with possible C. difficile. See above.  * Acute toxic encephalopathy due to sepsis. CT scan of the head negative. Should improve as his infection improves.  * Acute kidney injury due to severe sepsis and bladder outlet obstruction. Should improve with IV fluids. Monitor input and output. Repeat labs improving.  * CLL with chronic leukocytosis.  * Hypertension. 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 No UTI. Start Flomax. Foley catheter placed and will continue. No hydronephrosis.  Was seen by urologist in past, called consult.   Added  finasterid.   Pt have pain with catheter- added B & O suppository.  * DVT prophylaxis with Lovenox   All the records are reviewed and case discussed with Care Management/Social Workerr. Management plans discussed with the patient, family and they are in agreement.  CODE STATUS: full.  TOTAL TIME TAKING CARE OF THIS PATIENT: 35 minutes.    POSSIBLE D/C IN 2-3 DAYS, DEPENDING ON CLINICAL CONDITION.   Vaughan Basta M.D on 12/02/2016   Between 7am to 6pm - Pager - [phone]  After 6pm go to [email or web address] - password [email or web address] [facility]  Office  [phone]  CC: Primary care physician; [name], MD  Note: This dictation was prepared with Dragon dictation along with smaller phrase technology. Any transcriptional errors that result from this process are unintentional.

## 2016-12-02 NOTE — Progress Notes (Signed)
Bladder scan completed to assess for retention. 50cc noted. Foley in place. Anti spasm medication administered. Will cont to monitor

## 2016-12-02 NOTE — Clinical Social Work Note (Signed)
CSW received phone call from [facility] that patient has been approved for SNF from insurance company.  CSW updated physician and case manager, per MD patient is not ready for discharge to SNF yet.  CSW updated [facility], CSW to continue to follow patient's progress throughout discharge planning.  Jones Broom. [place], MSW, [place]  12/02/2016 3:20 PM

## 2016-12-02 NOTE — Progress Notes (Signed)
Pt more calm and less disoriented at this time. Pt able to tolerate full liquids. Ate 2 bowls of soup and 2 drinks. mits off for trial. Will cont to monitor.

## 2016-12-02 NOTE — Progress Notes (Signed)
[place] INFECTIOUS DISEASE PROGRESS NOTE Date of Admission:  11/30/2016     ID: William Kennedy is a 76 y.o. male with  MSSA back cellulitis, diverticulitis, C diff, CLL Active Problems:   Sepsis ([place])   Urinary retention   Microscopic hematuria   Subjective: No fevers, less confused. BP HR good.   ROS  Eleven systems are reviewed and negative except per hpi  Medications:  Antibiotics Given (last 72 hours)    Date/Time Action Medication Dose Rate   11/30/16 1435 New Bag/Given   piperacillin-tazobactam (ZOSYN) IVPB 3.375 g 3.375 g 100 mL/hr   11/30/16 1524 New Bag/Given   vancomycin (VANCOCIN) IVPB 1000 mg/200 mL premix 1,000 mg 200 mL/hr   11/30/16 2058 New Bag/Given   piperacillin-tazobactam (ZOSYN) IVPB 3.375 g 3.375 g 12.5 mL/hr   11/30/16 2122 Given  [med from pharmacy]   vancomycin (VANCOCIN) 50 mg/mL oral solution 500 mg 500 mg    11/30/16 2235 New Bag/Given   vancomycin (VANCOCIN) 1,500 mg in sodium chloride 0.9 % 500 mL IVPB 1,500 mg 250 mL/hr   12/01/16 0125 Given   vancomycin (VANCOCIN) 50 mg/mL oral solution 500 mg 500 mg    12/01/16 0451 New Bag/Given   piperacillin-tazobactam (ZOSYN) IVPB 3.375 g 3.375 g 12.5 mL/hr   12/01/16 0646 Given   vancomycin (VANCOCIN) 50 mg/mL oral solution 500 mg 500 mg    12/01/16 1016 New Bag/Given   vancomycin (VANCOCIN) 1,500 mg in sodium chloride 0.9 % 500 mL IVPB 1,500 mg 250 mL/hr   12/01/16 1202 Given   vancomycin (VANCOCIN) 50 mg/mL oral solution 500 mg 500 mg    12/01/16 1300 New Bag/Given   piperacillin-tazobactam (ZOSYN) IVPB 3.375 g 3.375 g 12.5 mL/hr   12/01/16 1728 Given   vancomycin (VANCOCIN) 50 mg/mL oral solution 500 mg 500 mg    12/01/16 2124 New Bag/Given   piperacillin-tazobactam (ZOSYN) IVPB 3.375 g 3.375 g 12.5 mL/hr   12/01/16 2124 New Bag/Given   vancomycin (VANCOCIN) 1,250 mg in sodium chloride 0.9 % 250 mL IVPB 1,250 mg 166.7 mL/hr   12/01/16 2354 Given   vancomycin (VANCOCIN) 50 mg/mL oral  solution 500 mg 500 mg    12/02/16 0434 New Bag/Given   piperacillin-tazobactam (ZOSYN) IVPB 3.375 g 3.375 g 12.5 mL/hr   12/02/16 0434 Given   vancomycin (VANCOCIN) 50 mg/mL oral solution 500 mg 500 mg    12/02/16 0957 New Bag/Given   vancomycin (VANCOCIN) 1,250 mg in sodium chloride 0.9 % 250 mL IVPB 1,250 mg 166.7 mL/hr   12/02/16 1246 Given   vancomycin (VANCOCIN) 50 mg/mL oral solution 500 mg 500 mg      . alteplase  2 mg Intracatheter Once  . enoxaparin (LOVENOX) injection  40 mg Subcutaneous Q24H  . feeding supplement (ENSURE ENLIVE)  237 mL Oral BID BM  . finasteride  5 mg Oral Daily  . metoprolol tartrate  12.5 mg Oral BID  . tamsulosin  0.4 mg Oral Daily  . vancomycin  500 mg Oral Q6H    Objective: Vital signs in last 24 hours: Temp:  [98.3 F (36.8 C)-98.5 F (36.9 C)] 98.5 F (36.9 C) (07/12 1106) Pulse Rate:  [92-102] 92 (07/12 1106) Resp:  [18] 18 (07/11 2313) BP: (125-146)/(65-74) 125/74 (07/12 1106) SpO2:  [95 %] 95 % (07/12 1106) Weight:  [108.6 kg (239 lb 6.7 oz)] 108.6 kg (239 lb 6.7 oz) (07/12 0425) Constitutional: He is awake, interactive , lying in bed  Obese, disheveled. HENT: anicteric Mouth/Throat:  Oropharynx is clear and dry . No oropharyngeal exudate.  Cardiovascular: Normal rate, regular rhythm and normal heart sounds Pulmonary/Chest: Effort normal and breath sounds normal. No respiratory distress. He has no wheezes.  Abdominal: obese,  Mild diffuse ttp Lymphadenopathy: He has no cervical adenopathy.  Neurological: He is more interactive   Skin: L upper back with incision site with penrose in place with purulent drainage. Decreased but still significant surrounding redness and induration  Psychiatric: calm   Lab Results  Recent Labs  11/30/16 1424 12/01/16 0430  WBC 27.8* 21.6*  HGB 15.8 14.7  HCT 47.0 43.2  NA 133* 138  K 4.5 3.8  CL 94* 102  CO2 24 27  BUN 12 10  CREATININE 1.72* 1.29*    Microbiology: Results for orders placed  or performed during the hospital encounter of 11/30/16  Blood Culture (routine x 2)     Status: None (Preliminary result)   Collection Time: 11/30/16  2:43 PM  Result Value Ref Range Status   Specimen Description BLOOD BLOOD LEFT HAND  Final   Special Requests   Final    BOTTLES DRAWN AEROBIC AND ANAEROBIC Blood Culture results may not be optimal due to an inadequate volume of blood received in culture bottles   Culture NO GROWTH 2 DAYS  Final   Report Status PENDING  Incomplete  Blood Culture (routine x 2)     Status: None (Preliminary result)   Collection Time: 11/30/16  3:17 PM  Result Value Ref Range Status   Specimen Description BLOOD BLOOD LEFT FOREARM  Final   Special Requests   Final    BOTTLES DRAWN AEROBIC AND ANAEROBIC Blood Culture adequate volume   Culture NO GROWTH 2 DAYS  Final   Report Status PENDING  Incomplete  Urine culture     Status: None   Collection Time: 11/30/16  3:17 PM  Result Value Ref Range Status   Specimen Description URINE, RANDOM  Final   Special Requests NONE  Final   Culture   Final    NO GROWTH Performed at [facility], [place] [address], [place], [place] [address]    Report Status 12/02/2016 FINAL  Final  C difficile quick scan w PCR reflex     Status: None   Collection Time: 12/01/16  9:15 AM  Result Value Ref Range Status   C Diff antigen NEGATIVE NEGATIVE Final   C Diff toxin NEGATIVE NEGATIVE Final   C Diff interpretation No C. difficile detected.  Final    Studies/Results: Ct Head Wo Contrast  Result Date: 11/30/2016 CLINICAL DATA:  Altered mental status. EXAM: CT HEAD WITHOUT CONTRAST TECHNIQUE: Contiguous axial images were obtained from the base of the skull through the vertex without intravenous contrast. COMPARISON:  None. FINDINGS: Brain: Mild diffuse cortical atrophy is noted. Mild chronic ischemic white matter disease is noted. No mass effect or midline shift is noted. Ventricular size is within normal limits. There is no  evidence of mass lesion, hemorrhage or acute infarction. Vascular: No hyperdense vessel or unexpected calcification. Skull: Normal. Negative for fracture or focal lesion. Sinuses/Orbits: No acute finding. Other: None. IMPRESSION: Mild diffuse cortical atrophy. Mild chronic ischemic white matter disease. No acute intracranial abnormality seen. Electronically Signed   By: Marijo Conception, M.D.   On: 11/30/2016 16:21   Ct Chest W Contrast  Result Date: 11/30/2016 CLINICAL DATA:  Abdominal pain and hypotension. EXAM: CT CHEST, ABDOMEN, AND PELVIS WITH CONTRAST TECHNIQUE: Multidetector CT imaging of the chest,  abdomen and pelvis was performed following the standard protocol during bolus administration of intravenous contrast. CONTRAST:  47mL ISOVUE-300 IOPAMIDOL (ISOVUE-300) INJECTION 61% COMPARISON:  CT scan 11/08/2016 FINDINGS: CT CHEST FINDINGS Cardiovascular: The heart is normal in size. No pericardial effusion. There is tortuosity and calcification of the thoracic aorta. The branch vessels are patent. The advanced three-vessel coronary artery calcifications are noted. Mediastinum/Nodes: Small scattered mediastinal and hilar lymph nodes but no mass or overt adenopathy. The esophagus is grossly normal. Lungs/Pleura: Advanced emphysematous changes and pulmonary scarring. Significant breathing motion artifact no obvious pulmonary lesions. No acute pulmonary findings such as infiltrate, edema or effusion. No pneumothorax. The tracheobronchial tree is grossly normal. Chest wall/ Musculoskeletal: No chest wall mass, supraclavicular or axillary lymphadenopathy. A few small scattered lymph nodes are noted. The thyroid gland appears normal. Small amount of injected air is noted in the left subclavian vein and right internal jugular vein. No significant bony findings. CT ABDOMEN PELVIS FINDINGS Hepatobiliary: No focal hepatic lesions or intrahepatic biliary dilatation. Gallbladder is normal. No common bile duct dilatation.  Pancreas: No mass, inflammation or ductal dilatation. Spleen: Normal size.  No focal lesions. Adrenals/Urinary Tract: Stable large rim calcified right adrenal gland cyst. The left adrenal gland is unremarkable. Stable scarring changes involving the left kidney. A small left renal calculus is noted. The left-sided hydroureteronephrosis, seen on the prior study, has resolved. Stable large right renal cyst. No right-sided renal or ureteral calculi. No bladder calculi. Moderate symmetric bladder wall thickening likely due to bladder outlet obstruction from a markedly enlarged prostate gland. Stomach/Bowel: The stomach, duodenum and small bowel are unremarkable. The terminal ileum is normal. The appendix is normal. Diffuse descending and sigmoid diverticulosis with acute diverticulitis involving the sigmoid colon. There is a large inflamed diverticuli also seen on the prior study with some surrounding inflammation. No complicating features such as free air or abscess. Vascular/Lymphatic: Stable advanced atherosclerotic calcifications involving the abdominal aorta and iliac arteries. The branch vessels are patent. Small caliber left renal artery with ostial calcifications bilaterally. New line no mesenteric or retroperitoneal lymphadenopathy. Small scattered lymph nodes are stable. There are also small external iliac lymph nodes which are stable. Reproductive: Markedly enlarged prostate gland. The seminal vesicles appear normal. Other: No free pelvic fluid collection or inguinal adenopathy. Musculoskeletal: No significant bony findings. Advanced degenerative changes involving the lumbar spine. IMPRESSION: 1. Persistent or recurrent sigmoid diverticulitis without complicating features such as abscess or free air. 2. No acute or significant pulmonary findings. 3. Stable large rim calcified right adrenal gland cyst. 4. Resolution of left ureteral calculus. Small left renal calculus noted. 5. Markedly enlarged prostate gland  with bladder outlet obstruction and moderate uniform bladder wall thickening. Electronically Signed   By: Marijo Sanes M.D.   On: 11/30/2016 16:29   Ct Abdomen Pelvis W Contrast  Result Date: 11/30/2016 CLINICAL DATA:  Abdominal pain and hypotension. EXAM: CT CHEST, ABDOMEN, AND PELVIS WITH CONTRAST TECHNIQUE: Multidetector CT imaging of the chest, abdomen and pelvis was performed following the standard protocol during bolus administration of intravenous contrast. CONTRAST:  1mL ISOVUE-300 IOPAMIDOL (ISOVUE-300) INJECTION 61% COMPARISON:  CT scan 11/08/2016 FINDINGS: CT CHEST FINDINGS Cardiovascular: The heart is normal in size. No pericardial effusion. There is tortuosity and calcification of the thoracic aorta. The branch vessels are patent. The advanced three-vessel coronary artery calcifications are noted. Mediastinum/Nodes: Small scattered mediastinal and hilar lymph nodes but no mass or overt adenopathy. The esophagus is grossly normal. Lungs/Pleura: Advanced emphysematous changes and pulmonary scarring.  Significant breathing motion artifact no obvious pulmonary lesions. No acute pulmonary findings such as infiltrate, edema or effusion. No pneumothorax. The tracheobronchial tree is grossly normal. Chest wall/ Musculoskeletal: No chest wall mass, supraclavicular or axillary lymphadenopathy. A few small scattered lymph nodes are noted. The thyroid gland appears normal. Small amount of injected air is noted in the left subclavian vein and right internal jugular vein. No significant bony findings. CT ABDOMEN PELVIS FINDINGS Hepatobiliary: No focal hepatic lesions or intrahepatic biliary dilatation. Gallbladder is normal. No common bile duct dilatation. Pancreas: No mass, inflammation or ductal dilatation. Spleen: Normal size.  No focal lesions. Adrenals/Urinary Tract: Stable large rim calcified right adrenal gland cyst. The left adrenal gland is unremarkable. Stable scarring changes involving the left  kidney. A small left renal calculus is noted. The left-sided hydroureteronephrosis, seen on the prior study, has resolved. Stable large right renal cyst. No right-sided renal or ureteral calculi. No bladder calculi. Moderate symmetric bladder wall thickening likely due to bladder outlet obstruction from a markedly enlarged prostate gland. Stomach/Bowel: The stomach, duodenum and small bowel are unremarkable. The terminal ileum is normal. The appendix is normal. Diffuse descending and sigmoid diverticulosis with acute diverticulitis involving the sigmoid colon. There is a large inflamed diverticuli also seen on the prior study with some surrounding inflammation. No complicating features such as free air or abscess. Vascular/Lymphatic: Stable advanced atherosclerotic calcifications involving the abdominal aorta and iliac arteries. The branch vessels are patent. Small caliber left renal artery with ostial calcifications bilaterally. New line no mesenteric or retroperitoneal lymphadenopathy. Small scattered lymph nodes are stable. There are also small external iliac lymph nodes which are stable. Reproductive: Markedly enlarged prostate gland. The seminal vesicles appear normal. Other: No free pelvic fluid collection or inguinal adenopathy. Musculoskeletal: No significant bony findings. Advanced degenerative changes involving the lumbar spine. IMPRESSION: 1. Persistent or recurrent sigmoid diverticulitis without complicating features such as abscess or free air. 2. No acute or significant pulmonary findings. 3. Stable large rim calcified right adrenal gland cyst. 4. Resolution of left ureteral calculus. Small left renal calculus noted. 5. Markedly enlarged prostate gland with bladder outlet obstruction and moderate uniform bladder wall thickening. Electronically Signed   By: Marijo Sanes M.D.   On: 11/30/2016 16:29   Dg Chest Port 1 View  Result Date: 11/30/2016 CLINICAL DATA:  Pt brought in for hypotension. He has  an abcess on the left posterior shoulder and has a picc line. Hx of leukemia. Former smoker EXAM: PORTABLE CHEST 1 VIEW COMPARISON:  Chest x-ray dated 11/05/2016. Chest x-ray dated 04/02/2010. FINDINGS: Right-sided PICC line in place with tip adequately positioned at the level of the mid/lower SVC. Heart size is upper normal, stable. Overall cardiomediastinal silhouette is stable. Coarse lung markings bilaterally suggests chronic interstitial lung disease. No new lung findings. No pleural effusion or pneumothorax seen. Osseous structures about the chest are unremarkable. Vague lucency within the soft tissues about the left shoulder is presumably related to patient's known soft tissue abscess. IMPRESSION: No active disease.  No evidence of pneumonia or pulmonary edema. Probable chronic interstitial lung disease. Electronically Signed   By: Franki Cabot M.D.   On: 11/30/2016 14:07    Assessment/Plan: LIMMIE SCHOENBERG is a 76 y.o. male with CLL admitted with hypotension and abd pain.  On admit febrile to 101 and wbc 28.  CT showed persistent diverticulitis but no free air or fluid, as well as enlarged prostate with BOO. Recently admitted for large MSSA L upper back abscess  and found also to have ruptured diverticulitis with abscess C diff as well as renal stones.Marland Kitchen   He is very weak and deconditioned.  As otpt he has been on unasyn for his abd infection and MSSA abscess on back and oral vanco for C diff. I saw him 7/6 and the back abscess was improving but still present and we extended his abx course.    Recommendations Pending IGG levels - if low repeat IVIG Continue vanco and zosyn pending bcx Consult surgery Thank you very much for the consult. Will follow with you.  Laverna Dossett P   12/02/2016, 1:03 PM

## 2016-12-02 NOTE — Telephone Encounter (Signed)
I moved it to the morning and cx all the other test.  Sharyn Lull

## 2016-12-02 NOTE — Progress Notes (Signed)
pts foley catheter is leaking quite much. Urology not avail at this time. Notified Primary. Instructed to deflate and reinflate balloon. Performed this  And  Did get some urine return. Pt continues to "push urine" past the catheter feeling like he has to urinate. Informed pt that catheter will remove the urine.. This Probation officer feels the patient must be having spasms causing him this feeling. Will give medication to reduce spasms as ordered and cont to monitor.

## 2016-12-02 NOTE — Progress Notes (Signed)
Initial Nutrition Assessment  DOCUMENTATION CODES:   Obesity unspecified  INTERVENTION:  Provide Ensure Enlive po TID, each supplement provides 350 kcal and 20 grams of protein.   Provide Magic cup TID with meals, each supplement provides 290 kcal and 9 grams of protein.   NUTRITION DIAGNOSIS:   Inadequate oral intake related to poor appetite, lethargy/confusion as evidenced by per patient/family report.  GOAL:   Patient will meet greater than or equal to 90% of their needs  MONITOR:   PO intake, Supplement acceptance, Diet advancement, Labs, Weight trends, I & O's  REASON FOR ASSESSMENT:   Low Braden    ASSESSMENT:   76 year old male with PMHx of BPH, HTN upper back abscess with MSSA had discharged home with IV antibiotics. Patient now admitted with severe sepsis with encephalopathy, upper back abscess, sigmoid diverticulitis with possible C. Diff, AKI due to sepsis and bladder outlet obstruction.    Spoke with patient at bedside. He was confused and no family at bedside. Patient reports he has had a poor appetite for the past month due to absence of hugner. He is only able to report he is not eating well and no other details on intake.   Pt unable to provide reliable wt hx. He reports his UBW is 160 lbs, which is likely not accurate. Limited weight hx in chart as 270 lbs from 10/29/2016 does not appear like a true measured weight.  Meal Completion: variable - 0% of breakfast, 50% of lunch  Medications reviewed and include: vancomycin 500 mg Q6hrs, NS @ 75 ml/hr, Zosyn, vancomycin.   Labs reviewed: Creatinine 1.29. Negative for C. Diff.   Nutrition-Focused physical exam completed. Findings are no fat depletion, no muscle depletion, and no edema.   Patient does not meet criteria for malnutrition at this time, but is at risk for malnutrition.  Discussed with RN. Patient is not a reliable historian today.   Diet Order:  Diet full liquid Room service appropriate? Yes; Fluid  consistency: Thin  Skin:  Wound (see comment) (abscess/cellulitis left upper back)  Last BM:  12/02/2016 - small type 7  Height:   Ht Readings from Last 1 Encounters:  11/30/16 6\' 2"  (1.88 m)    Weight:   Wt Readings from Last 1 Encounters:  12/02/16 239 lb 6.7 oz (108.6 kg)    Ideal Body Weight:  86.4 kg  BMI:  Body mass index is 30.74 kg/m.  Estimated Nutritional Needs:   Kcal:  1194-1740 (MSJ x 1.2-1.3)  Protein:  100-120 grams (0.9-1.1 grams/kg)  Fluid:  2.1 L/day (25 ml/kg IBW)  EDUCATION NEEDS:   Education needs no appropriate at this time  Willey Blade, MS, RD, LDN Pager: [phone] After Hours Pager: [phone]

## 2016-12-02 NOTE — Progress Notes (Signed)
Patient is still confused and restless but was able to say the word nurse and a few other sensible words.  Constantly trys to tug at foley catheter.  Safety mitts in place and checked per policy.

## 2016-12-02 NOTE — Progress Notes (Signed)
His chief complaint for the purpose of surgical consultation is some pain around the drain site of the left upper back. He presented to the office on May 22 with a two-month history of a large abscess of the back. This was drained and inserted Penrose drains. Subsequent culture demonstrated methicillin sensitive Staphylococcus aureus.  He was later admitted to the hospital with diverticulitis and ureteral calculus and persistent drainage from the back abscess. He has been treated with a prolonged course of intravenous antibiotics.  He recently is been on for a period of about 3 weeks. He was unable to come to the office to have the back abscess examined. There was however readmitted 2 days ago with hypotension and evidence of sepsis.  His repeat CT scan demonstrated diverticulitis of the sigmoid colon but no evidence of perforation.  In the hospital he is now being treated with Zosyn and vancomycin.  He reports she is feeling much better today than he was 2 days ago. He still not been able to do any substantial amount of walking. He has not walked today.  Has been having dressing changes 2 times per day with persistent purulent drainage.  Past medical history was again noted which does include chronic lymphocytic leukemia. His white blood count is chronically elevated.  Review of systems he continues to be very weak. He has not been able to walk. He has had some anorexia the last few days but oral intake is some better today. He reports some persistent diarrhea.  On examination he is awake alert and oriented. He is resting in the hospital bed. He does have significant weakness and has difficulty turning from side to side in bed.  ABDOMEN: Obese soft and nontender with no palpable mass  BACK: This looks much better than it did 3 weeks ago. Much of the redness has resolved. The dressing was removed. There is still some purplish discoloration and redness lateral to the drain site in the area of  approximately 8 x 8 cm. There is no fluctuance. The 2 Penrose drain to remain intact. There is some persistent thin purulent scant drainage.  EXTREMITIES: His PICC line has been removed.  Recent blood cultures no growth in 2 days. CBC with white blood count 27,800, hemoglobin 15.8  Impression improvement with respect a history of diverticulitis Improvement with respect to history of large subcutaneous abscess of the back. Recent findings of Clostridium difficile colitis  I recommended that we leave the Penrose drains in for approximately 1 more week. Continue dressing changes 2 times per day. Consider approximately 1 week of additional intravenous antibiotics. Anticipate continuing oral vancomycin for a period of 10-14 days after cessation of IV antibiotics.  He was advised to work on walking for exercise.

## 2016-12-02 NOTE — Progress Notes (Signed)
PT Cancellation Note  Patient Details Name: William Kennedy MRN: [id number] DOB: Jul 16, 1940   Cancelled Treatment:    Reason Eval/Treat Not Completed: Fatigue/lethargy limiting ability to participate   Chart reviewed and attempted to see pt this am.  Wife in with pt.  Pt slept through interaction with wife, only opening eyes briefly.  Pt's wife stated he remains confused but slightly better.  She voices concerns about his confusion and that we will "kick him out" if he is not cooperative with staff.  Encouragement provided along with care expectations.  Attempted to awaken pt but he remained asleep and not appropriate at this time for session.  Will continue as appropriate.  At times pt with tremors noted.  Wife stated he will occasionally say he is cold but quickly kicks off covers.     Chesley Noon 12/02/2016, 9:53 AM

## 2016-12-03 MED ORDER — VANCOMYCIN 50 MG/ML ORAL SOLUTION: 500.0000 mg | Freq: Four times a day (QID) | ORAL | 0 refills | Status: DC

## 2016-12-03 MED ORDER — LOPERAMIDE HCL 2 MG PO CAPS: 2.0000 mg | ORAL_CAPSULE | Freq: Four times a day (QID) | ORAL | 0 refills | Status: AC | PRN

## 2016-12-03 MED ORDER — ENSURE ENLIVE PO LIQD: 237.0000 mL | Freq: Three times a day (TID) | ORAL | 12 refills | Status: AC

## 2016-12-03 MED ORDER — FINASTERIDE 5 MG PO TABS: 5.0000 mg | ORAL_TABLET | Freq: Every day | ORAL | 0 refills | Status: AC

## 2016-12-03 MED ORDER — METOPROLOL TARTRATE 25 MG PO TABS: 12.5000 mg | ORAL_TABLET | Freq: Two times a day (BID) | ORAL | 0 refills | Status: AC

## 2016-12-03 MED ORDER — BELLADONNA ALKALOIDS-OPIUM 16.2-60 MG RE SUPP: 1.0000 | Freq: Two times a day (BID) | RECTAL | 0 refills | Status: AC | PRN

## 2016-12-03 MED ORDER — TAMSULOSIN HCL 0.4 MG PO CAPS: 0.4000 mg | ORAL_CAPSULE | Freq: Every day | ORAL | 0 refills | Status: AC

## 2016-12-03 MED ORDER — LOPERAMIDE HCL 2 MG PO CAPS
2.0000 mg | ORAL_CAPSULE | Freq: Four times a day (QID) | ORAL | Status: DC | PRN
  Administered 2016-12-04 (×2): 2 mg via ORAL
  Filled 2016-12-03 (×2): qty 1

## 2016-12-03 MED ORDER — HYDROCODONE-ACETAMINOPHEN 5-325 MG PO TABS: 1.0000 | ORAL_TABLET | ORAL | 0 refills | Status: DC | PRN

## 2016-12-03 MED ORDER — PIPERACILLIN-TAZOBACTAM 3.375 G IVPB: 3.3750 g | Freq: Three times a day (TID) | INTRAVENOUS | 0 refills | Status: AC

## 2016-12-03 NOTE — Care Management Important Message (Signed)
Important Message  Patient Details  Name: William Kennedy MRN: [id number] Date of Birth: Aug 17, 1940   Medicare Important Message Given:  Yes    Jolly Mango, RN 12/03/2016, 12:04 PM

## 2016-12-03 NOTE — Progress Notes (Signed)
[facility] [place] NAME: William Kennedy    MR#:  [id number]  DATE OF BIRTH:  1941-01-08  SUBJECTIVE:  CHIEF COMPLAINT:   Chief Complaint  Patient presents with  . Hypotension  Recent admission for left upper back infection, incision and drainage and sent home with IV antibiotic. Also had C. difficile colitis so was on oral vancomycin. Brought back to emergency room by wife because of confusion and worsening for last few days. Noted to have elevated white blood cell count but he has history of CLL. Also had complain of urinary retention, Foley catheter is placed. For last 2 days patient was very agitated and completely confused, today for the first time he is calm, comfortable, alert and oriented.  REVIEW OF SYSTEMS:    Review of Systems  Constitutional: Negative for chills, fever and malaise/fatigue.  HENT: Negative for congestion, ear discharge, hearing loss and sore throat.   Eyes: Negative for blurred vision and double vision.  Respiratory: Negative for cough, hemoptysis and shortness of breath.   Cardiovascular: Negative for chest pain, palpitations and orthopnea.  Gastrointestinal: Negative for abdominal pain, blood in stool, heartburn, nausea and vomiting.  Genitourinary: Negative for dysuria and hematuria.  Musculoskeletal: Negative for back pain and neck pain.  Skin: Negative for rash.  Neurological: Positive for weakness. Negative for dizziness, tremors, focal weakness and headaches.  Psychiatric/Behavioral: Negative for depression.    DRUG ALLERGIES:  No Known Allergies  VITALS:  Blood pressure 125/70, pulse 72, temperature 98.8 F (37.1 C), temperature source Oral, resp. rate 18, height 6\' 2"  (1.88 m), weight 114.3 kg (252 lb 1 oz), SpO2 98 %.  PHYSICAL EXAMINATION:   GENERAL:  76 y.o.-year-old patient lying in the bed , no distress. EYES: Pupils equal, round, reactive to light and accommodation. No scleral icterus.  Extraocular muscles intact.  HEENT: Head atraumatic, normocephalic. Oropharynx and nasopharynx clear. No oropharyngeal erythema, moist oral mucosa  NECK:  Supple, no jugular venous distention. No thyroid enlargement, no tenderness.  LUNGS: Normal breath sounds bilaterally, no wheezing, rales, rhonchi. No use of accessory muscles of respiration.  CARDIOVASCULAR: S1, S2 normal. No murmurs, rubs, or gallops.  ABDOMEN: Soft, tenderness diffusely. No rigidity or guarding. Foley catheter in place with dark urine EXTREMITIES: No pedal edema, cyanosis, or clubbing. + 2 pedal & radial pulses b/l.   NEUROLOGIC: Moves all 4 extremities , power 4/5 all limbs, sensation and gait not checked. PSYCHIATRIC: The patient is alert and oriented X3. On left upper back, large cellulitis area with redness.  Physical Exam LABORATORY PANEL:   CBC  Recent Labs Lab 12/01/16 0430  WBC 21.6*  HGB 14.7  HCT 43.2  PLT 313   ------------------------------------------------------------------------------------------------------------------  Chemistries   Recent Labs Lab 11/30/16 1424 12/01/16 0430  NA 133* 138  K 4.5 3.8  CL 94* 102  CO2 24 27  GLUCOSE 119* 94  BUN 12 10  CREATININE 1.72* 1.29*  CALCIUM 9.8 8.9  AST 77*  --   ALT 53  --   ALKPHOS 133*  --   BILITOT 1.0  --    ------------------------------------------------------------------------------------------------------------------  Cardiac Enzymes  Recent Labs Lab 11/30/16 1424  TROPONINI <0.03   ------------------------------------------------------------------------------------------------------------------  RADIOLOGY:  Dg Abd 1 View  Result Date: 12/02/2016 CLINICAL DATA:  Lithotripsy 11/04/2016 for an obstructing distal left ureteral calculus. EXAM: ABDOMEN - 1 VIEW COMPARISON:  CT abdomen and pelvis 11/30/2016, 11/05/2016, 10/31/2016 and earlier. KUB 11/01/2016. FINDINGS: The very small calculus in  a mid calyx of the left kidney  visualized on the CT 2 days ago is not visible on the x-ray. No visible opaque urinary tract calculi on either side. Numerous left-sided pelvic phleboliths. Aortoiliofemoral atherosclerosis. Bowel gas pattern unremarkable. IMPRESSION: No visible opaque urinary tract calculi (the very small calculus in a mid calyx of the left kidney seen on the CT 2 days ago is not visible on the x-ray). Electronically Signed   By: Evangeline Dakin M.D.   On: 12/02/2016 13:17    ASSESSMENT AND PLAN:   Active Problems:   Sepsis ([place])   Urinary retention   Microscopic hematuria   * Severe sepsis with encephalopathy. Critically ill. Multiple etiologies are upper back abscess with purulent drainage and surrounding cellulitis and sigmoid diverticulitis. He also has ongoing diarrhea with recent C. difficile infection- C diff test negative in this admission. Patient is on IV ceftriaxone at home. PICC line in place. Need to rule out bacteremia. Blood cultures sent and negative so far. Fluid bolus per sepsis protocol and repeat lactic acid.   broad-spectrum antibiotics with vancomycin and Zosyn while waiting for cultures. Also oral vancomycin. Negative C. difficile stool studies. Consult surgery Dr. Tamala Julian for his upper back abscess. As per him cont drain for 1 more week and IV abx, then follow in office.  Will place PICC line again, and likely d.c tomorrow.  * Upper back abscess- see above   Appreciated surgery consult.  * Sigmoid diverticulitis with C. difficile.    On oral vanc, c diff negative now- will cont oral vanc untill 10 days after finishing IV Abx.  * Acute toxic encephalopathy due to sepsis. CT scan of the head negative.    Significant improvement.  * Acute kidney injury due to severe sepsis and bladder outlet obstruction. Should improve with IV fluids. Monitor input and output. Repeat labs improving.  * CLL with chronic leukocytosis.  * Hypertension. Continue home medications  * Urinary  retention due to benign prostatic hypertrophy. No UTI. Start Flomax. Foley catheter placed and will continue. No hydronephrosis.  Was seen by urologist in past, called consult.   Added finasterid.   Pt have pain with catheter- added B & O suppository.  this helped with pain control.  * DVT prophylaxis with Lovenox   All the records are reviewed and case discussed with Care Management/Social Workerr. Management plans discussed with the patient, family and they are in agreement.  CODE STATUS: full.  TOTAL TIME TAKING CARE OF THIS PATIENT: 35 minutes.   Getting PT eval today and start d/c planning likely d/c tomorrow.  POSSIBLE D/C IN 2-3 DAYS, DEPENDING ON CLINICAL CONDITION.   Vaughan Basta M.D on 12/03/2016   Between 7am to 6pm - Pager - [phone]  After 6pm go to [email or web address] - password [email or web address] [facility]  Office  [phone]  CC: Primary care physician; [name], MD  Note: This dictation was prepared with Dragon dictation along with smaller phrase technology. Any transcriptional errors that result from this process are unintentional.

## 2016-12-03 NOTE — Progress Notes (Signed)
Pharmacy Antibiotic Note  William Kennedy is a 76 y.o. male admitted on 11/30/2016 with abscess/cellulits.  Pharmacy has been consulted for vancomcyin/Zosyn dosing. Patient received vancomycin and Zosyn x 1 in ED.   7/13 Patient due for labs but refused to have them drawn. Having PICC line placed today.  Plan: Continue Zosyn EI 3.375g IV Q8hr.  Continue Vancomycin 1250mg  IV Q12hr for goal trough of 15-20.   7/13 Will reorder Vanc trough for 7/14 at 09:30.   Height: 6\' 2"  (188 cm) Weight: 252 lb 1 oz (114.3 kg) IBW/kg (Calculated) : 82.2  Temp (24hrs), Avg:98.8 F (37.1 C), Min:98.7 F (37.1 C), Max:98.8 F (37.1 C)   Recent Labs Lab 11/30/16 1424 11/30/16 1800 12/01/16 0430  WBC 27.8*  --  21.6*  CREATININE 1.72*  --  1.29*  LATICACIDVEN 4.1* 2.1*  --     Estimated Creatinine Clearance: 66.5 mL/min (A) (by C-G formula based on SCr of 1.29 mg/dL (H)).    No Known Allergies  Antimicrobials this admission: Zosyn 7/10 >>  Vancomycin 7/10 >>   Dose adjustments this admission: 7/11 13:22 vancomycin dose modified due to 30 lb weight loss and improved serum creatinine. Modify to vancomycin 1.25 gm IV Q12H, predicted trough 19 mcg/mL, although creatinine clearance likely not quite at baseline. Pharmacy will continue to follow and adjust as needed to maintain trough 15 to 20 mcg/mL.  Microbiology results: 7/10 BCx: pending  7/10 UCx: pending   Thank you for allowing pharmacy to be a part of this patient's care.  Paulina Fusi, PharmD, BCPS 12/03/2016 2:01 PM

## 2016-12-03 NOTE — Progress Notes (Signed)
Pt refused labs this morning. Rested quietly during the night with his wife at the bedside. VSS.

## 2016-12-03 NOTE — Progress Notes (Signed)
Chaplain rounding unit visit with pt. Pt appeared not to be in pain, but his wife states pt was cooperating with the care providers. William Kennedy talked with pt asked him to cooperative with the care providers, which pt agreed to do. Pt's wife seems overwhelmed, she states that pt has not allowed lab-technicians to draw his blood and was mean to them. William Kennedy asked pt to cooperate with the care providers to allow them to give the treatment he needs to heal. Pt agreed. William Kennedy prayed for pt and his wife. William Kennedy plans a follow with pt next Monday, if the pt is still here.   12/03/16 1600  Clinical Encounter Type  Visited With Patient and family together  Visit Type Follow-up;Spiritual support;Other (Comment)  Referral From Nurse  Consult/Referral To Chaplain  Spiritual Encounters  Spiritual Needs Prayer;Emotional

## 2016-12-03 NOTE — Progress Notes (Signed)
Infectious Disease Long Term IV Antibiotic Orders  Diagnosis: Back abscess, diverticulits, C diff  Culture results Special Requests ADDED 11/01/16   Gram Stain FEW WBC PRESENT, PREDOMINANTLY PMN  FEW GRAM POSITIVE COCCI IN PAIRS  RARE GRAM POSITIVE RODS  Performed at [facility], [address], [place], [place] [address]      Culture FEW STAPHYLOCOCCUS AUREUS   Report Status 11/04/2016 FINAL   Organism ID, Bacteria STAPHYLOCOCCUS AUREUS   Resulting Agency [facility]  Susceptibility    Staphylococcus aureus    MIC    CIPROFLOXACIN <=0.5 SENSITIVE "><=0.5 SENSI... Sensitive    CLINDAMYCIN <=0.25 SENSITIVE "><=0.25 SENS... Sensitive    ERYTHROMYCIN <=0.25 SENSITIVE "><=0.25 SENS... Sensitive    GENTAMICIN <=0.5 SENSITIVE "><=0.5 SENSI... Sensitive    Inducible Clindamycin NEGATIVE  Sensitive    OXACILLIN <=0.25 SENSITIVE "><=0.25 SENS... Sensitive    RIFAMPIN <=0.5 SENSITIVE "><=0.5 SENSI... Sensitive    TETRACYCLINE <=1 SENSITIVE "><=1 SENSITIVE  Sensitive    TRIMETH/SULFA <=10 SENSITIVE "><=10 SENSIT... Sensitive    VANCOMYCIN 1 SENSITIVE  Sensitive      Allergies: No Known Allergies Discharge antibiotics Zosyn  3.375  grams every 8 hours Oral vanco 500 mg po QID Until July 30th   PICC Care per protocol Labs weekly while on IV antibiotics      CBC w diff   Comprehensive met panel  Planned duration of antibiotics 1 week  At end of IV abx would change to oral keflex 500 bid if back abscess still present and draining  Stop date  July 20  Follow up clinic date July 20   FAX weekly labs to [phone]  Leonel Ramsay, MD

## 2016-12-03 NOTE — Progress Notes (Signed)
William Kennedy has refused to have the PICC placed at this time due to "fear of needles". RN is aware and to inform the MD.

## 2016-12-03 NOTE — Discharge Summary (Signed)
[facility] [place] NAME: William Kennedy    MR#:  [id number]  DATE OF BIRTH:  [birth year]  DATE OF ADMISSION:  11/30/2016 ADMITTING PHYSICIAN: Hillary Bow, MD  DATE OF DISCHARGE: 12/03/2016   PRIMARY CARE PHYSICIAN: [name], MD    ADMISSION DIAGNOSIS:  Diverticulitis [K57.92] Altered mental status [R41.82] Acute encephalopathy [G93.40] Chest wall abscess [L02.213] Severe sepsis ([place]) [A41.9, R65.20]  DISCHARGE DIAGNOSIS:  Active Problems:   Sepsis ([place])   Urinary retention   Microscopic hematuria   SECONDARY DIAGNOSIS:   Past Medical History:  Diagnosis Date  . Abscess    shoulder  . Leukemia [facility])     HOSPITAL COURSE:   * Severe sepsis with encephalopathy. Critically ill. Multiple etiologies are upper back abscess with purulent drainage and surrounding cellulitis and sigmoid diverticulitis. He also has ongoing diarrhea with recent C. difficile infection- C diff test negative in this admission. Patient is on IV ceftriaxone at home. PICC line in place. Need to rule out bacteremia. Blood cultures sent and negative so far. Fluid bolus per sepsis protocol and repeat lactic acid.   broad-spectrum antibiotics with vancomycin and Zosyn while waiting for cultures. Also oral vancomycin. Negative C. difficile stool studies. Consult surgery Dr. Tamala Julian for his upper back abscess. As per him cont drain for 1 more week and IV abx, then follow in office.  Will place PICC line again, ID suggest to d/c with Iv zosyn.  * Upper back abscess- see above   Appreciated surgery consult.  follow in one week.  * Sigmoid diverticulitis with C. difficile.    On oral vanc, c diff negative now- will cont oral vanc untill 10 days after finishing IV Abx.  * Acute toxic encephalopathy due to sepsis. CT scan of the head negative.    Significant improvement.  * Acute kidney injury due to severe sepsis and bladder outlet obstruction.  Should improve with IV fluids. Monitor input and output. Repeat labs improving.  * CLL with chronic leukocytosis.  * Hypertension. 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 No UTI. Start Flomax. Foley catheter placed and will continue. No hydronephrosis.  Was seen by urologist in past, called consult.   Added finasterid.   Pt have pain with catheter- added B & O suppository.  this helped with pain control.  * DVT prophylaxis with Lovenox  DISCHARGE CONDITIONS:   Stable.  CONSULTS OBTAINED:  Treatment Team:  Leonel Ramsay, MD Hollice Espy, MD  DRUG ALLERGIES:  No Known Allergies  DISCHARGE MEDICATIONS:   Current Discharge Medication List    START taking these medications   Details  feeding supplement, ENSURE ENLIVE, (ENSURE ENLIVE) LIQD Take 237 mLs by mouth 3 (three) times daily between meals. Qty: 237 mL, Refills: 12    finasteride (PROSCAR) 5 MG tablet Take 1 tablet (5 mg total) by mouth daily. Qty: 30 tablet, Refills: 0    HYDROcodone-acetaminophen (NORCO/VICODIN) 5-325 MG tablet Take 1 tablet by mouth every 4 (four) hours as needed for moderate pain. Qty: 30 tablet, Refills: 0    loperamide (IMODIUM) 2 MG capsule Take 1 capsule (2 mg total) by mouth every 6 (six) hours as needed for diarrhea or loose stools. Qty: 30 capsule, Refills: 0    metoprolol tartrate (LOPRESSOR) 25 MG tablet Take 0.5 tablets (12.5 mg total) by mouth 2 (two) times daily. Qty: 30 tablet, Refills: 0    opium-belladonna (B&O SUPPRETTES) 16.2-60 MG suppository  Place 1 suppository rectally 2 (two) times daily as needed for bladder spasms. Qty: 20 suppository, Refills: 0    piperacillin-tazobactam (ZOSYN) 3.375 GM/50ML IVPB Inject 50 mLs (3.375 g total) into the vein every 8 (eight) hours. Qty: 750 mL, Refills: 0    tamsulosin (FLOMAX) 0.4 MG CAPS capsule Take 1 capsule (0.4 mg total) by mouth daily. Qty: 30 capsule, Refills: 0     vancomycin (VANCOCIN) 50 mg/mL oral solution Take 10 mLs (500 mg total) by mouth every 6 (six) hours. Qty: 720 mL, Refills: 0      CONTINUE these medications which have NOT CHANGED   Details  acetaminophen (TYLENOL) 325 MG tablet Take 650 mg by mouth every 4 (four) hours as needed for pain.    ondansetron (ZOFRAN) 4 MG tablet Take 4 mg by mouth every 8 (eight) hours as needed for nausea.    traMADol (ULTRAM) 50 MG tablet Take 1 tablet (50 mg total) by mouth every 6 (six) hours as needed. Qty: 30 tablet, Refills: 0      STOP taking these medications     cefTRIAXone 1 g in dextrose 5 % 50 mL          DISCHARGE INSTRUCTIONS:    Labs as advised by ID. Follow in ID clinic, surgery clinic in 1 week. Follow in urology clinic in 1-2 weeks.  If you experience worsening of your admission symptoms, develop shortness of breath, life threatening emergency, suicidal or homicidal thoughts you must seek medical attention immediately by calling 911 or calling your MD immediately  if symptoms less severe.  You Must read complete instructions/literature along with all the possible adverse reactions/side effects for all the Medicines you take and that have been prescribed to you. Take any new Medicines after you have completely understood and accept all the possible adverse reactions/side effects.   Please note  You were cared for by a hospitalist during your hospital stay. If you have any questions about your discharge medications or the care you received while you were in the hospital after you are discharged, you can call the unit and asked to speak with the hospitalist on call if the hospitalist that took care of you is not available. Once you are discharged, your primary care physician will handle any further medical issues. Please note that NO REFILLS for any discharge medications will be authorized once you are discharged, as it is imperative that you return to your primary care physician (or  establish a relationship with a primary care physician if you do not have one) for your aftercare needs so that they can reassess your need for medications and monitor your lab values.    Today   CHIEF COMPLAINT:   Chief Complaint  Patient presents with  . Hypotension    HISTORY OF PRESENT ILLNESS:  Wofford Stratton  is a 76 y.o. male with a known history of Upper back abscess with MSSA, BPH, hypertension who was recently in the hospital for sigmoid diverticulitis with surrounding abscess from perforation, upper back abscess was discharged home on IV ceftriaxone with the PICC line in place. Patient has been noticed to be more confused complaining of pain in his abdomen and was taken to his primary care physician Dr. [name]. Here he was found to have blood pressure low at 62/17. Sent to the emergency room. Here his blood pressure is elevated but found to be tachycardic with lactic acid of 4.1. 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  Continues to have  purulent drainage and surrounding erythema and upper back abscess which had incision and drainage recently.  Found to have urinary retention and Foley catheter placed in the ER. No UTI.  CT scan of the abdomen shows sigmoid diverticulitis with improvement from last admission. Continues to have diarrhea with incontinence. Afebrile.  Receiving IV vancomycin and Zosyn in the ER.   VITAL SIGNS:  Blood pressure (!) 117/57, pulse 90, temperature 98.5 F (36.9 C), temperature source Oral, resp. rate 18, height 6\' 2"  (1.88 m), weight 114.3 kg (252 lb 1 oz), SpO2 99 %.  I/O:   Intake/Output Summary (Last 24 hours) at 12/03/16 1554 Last data filed at 12/03/16 1303  Gross per 24 hour  Intake          3038.75 ml  Output              525 ml  Net          2513.75 ml    PHYSICAL EXAMINATION:   GENERAL: 76 y.o.-year-old patient lying in the bed , no distress. EYES: Pupils equal, round, reactive to light and accommodation. No scleral icterus.  Extraocular muscles intact.  HEENT: Head atraumatic, normocephalic. Oropharynx and nasopharynx clear. No oropharyngeal erythema, moist oral mucosa  NECK: Supple, no jugular venous distention. No thyroid enlargement, no tenderness.  LUNGS: Normal breath sounds bilaterally, no wheezing, rales, rhonchi. No use of accessory muscles of respiration.  CARDIOVASCULAR: S1, S2 normal. No murmurs, rubs, or gallops.  ABDOMEN: Soft, tenderness diffusely. No rigidity or guarding. Foley catheter in place with dark urine EXTREMITIES: No pedal edema, cyanosis, or clubbing. + 2 pedal &radial pulses b/l.  NEUROLOGIC: Moves all 4 extremities , power 4/5 all limbs, sensation and gait not checked. PSYCHIATRIC: The patient is alert and oriented X3. On left upper back, large cellulitis area with redness.  DATA REVIEW:   CBC  Recent Labs Lab 12/01/16 0430  WBC 21.6*  HGB 14.7  HCT 43.2  PLT 313    Chemistries   Recent Labs Lab 11/30/16 1424 12/01/16 0430  NA 133* 138  K 4.5 3.8  CL 94* 102  CO2 24 27  GLUCOSE 119* 94  BUN 12 10  CREATININE 1.72* 1.29*  CALCIUM 9.8 8.9  AST 77*  --   ALT 53  --   ALKPHOS 133*  --   BILITOT 1.0  --     Cardiac Enzymes  Recent Labs Lab 11/30/16 1424  TROPONINI <0.03    Microbiology Results  Results for orders placed or performed during the hospital encounter of 11/30/16  Blood Culture (routine x 2)     Status: None (Preliminary result)   Collection Time: 11/30/16  2:43 PM  Result Value Ref Range Status   Specimen Description BLOOD BLOOD LEFT HAND  Final   Special Requests   Final    BOTTLES DRAWN AEROBIC AND ANAEROBIC Blood Culture results may not be optimal due to an inadequate volume of blood received in culture bottles   Culture NO GROWTH 3 DAYS  Final   Report Status PENDING  Incomplete  Blood Culture (routine x 2)     Status: None (Preliminary result)   Collection Time: 11/30/16  3:17 PM  Result Value Ref Range Status   Specimen  Description BLOOD BLOOD LEFT FOREARM  Final   Special Requests   Final    BOTTLES DRAWN AEROBIC AND ANAEROBIC Blood Culture adequate volume   Culture NO GROWTH 3 DAYS  Final   Report Status PENDING  Incomplete  Urine culture     Status: None   Collection Time: 11/30/16  3:17 PM  Result Value Ref Range Status   Specimen Description URINE, RANDOM  Final   Special Requests NONE  Final   Culture   Final    NO GROWTH Performed at [facility], [address], [place], [place] [address]    Report Status 12/02/2016 FINAL  Final  C difficile quick scan w PCR reflex     Status: None   Collection Time: 12/01/16  9:15 AM  Result Value Ref Range Status   C Diff antigen NEGATIVE NEGATIVE Final   C Diff toxin NEGATIVE NEGATIVE Final   C Diff interpretation No C. difficile detected.  Final    RADIOLOGY:  Dg Abd 1 View  Result Date: 12/02/2016 CLINICAL DATA:  Lithotripsy 11/04/2016 for an obstructing distal left ureteral calculus. EXAM: ABDOMEN - 1 VIEW COMPARISON:  CT abdomen and pelvis 11/30/2016, 11/05/2016, 10/31/2016 and earlier. KUB 11/01/2016. FINDINGS: The very small calculus in a mid calyx of the left kidney visualized on the CT 2 days ago is not visible on the x-ray. No visible opaque urinary tract calculi on either side. Numerous left-sided pelvic phleboliths. Aortoiliofemoral atherosclerosis. Bowel gas pattern unremarkable. IMPRESSION: No visible opaque urinary tract calculi (the very small calculus in a mid calyx of the left kidney seen on the CT 2 days ago is not visible on the x-ray). Electronically Signed   By: Evangeline Dakin M.D.   On: 12/02/2016 13:17    EKG:   Orders placed or performed during the hospital encounter of 11/30/16  . ED EKG 12-Lead  . ED EKG 12-Lead      Management plans discussed with the patient, family and they are in agreement.  CODE STATUS:     Code Status Orders        Start     Ordered   11/30/16 1810  Full code  Continuous     11/30/16  1810    Code Status History    Date Active Date Inactive Code Status Order ID Comments User Context   10/29/2016  3:25 PM 11/09/2016  6:11 PM Full Code [id number]  Nicholes Mango, MD Inpatient      TOTAL TIME TAKING CARE OF THIS PATIENT: 35 minutes.    Vaughan Basta M.D on 12/03/2016 at 3:54 PM  Between 7am to 6pm - Pager - [phone]  After 6pm go to [email or web address] - password [email or web address] [facility]  Office  [phone]  CC: Primary care physician; [name], MD   Note: This dictation was prepared with Dragon dictation along with smaller phrase technology. Any transcriptional errors that result from this process are unintentional.

## 2016-12-03 NOTE — Progress Notes (Signed)
[facility] INFECTIOUS DISEASE PROGRESS NOTE Date of Admission:  11/30/2016     ID: William Kennedy is a 76 y.o. male with  MSSA back cellulitis, diverticulitis, C diff, CLL Active Problems:   Sepsis ([place])   Urinary retention   Microscopic hematuria   Subjective: No fevers, less confused. BP HR good.   ROS   unable to obtain  Medications:  Antibiotics Given (last 72 hours)    Date/Time Action Medication Dose Rate   11/30/16 1524 New Bag/Given   vancomycin (VANCOCIN) IVPB 1000 mg/200 mL premix 1,000 mg 200 mL/hr   11/30/16 2058 New Bag/Given   piperacillin-tazobactam (ZOSYN) IVPB 3.375 g 3.375 g 12.5 mL/hr   11/30/16 2122 Given  [med from pharmacy]   vancomycin (VANCOCIN) 50 mg/mL oral solution 500 mg 500 mg    11/30/16 2235 New Bag/Given   vancomycin (VANCOCIN) 1,500 mg in sodium chloride 0.9 % 500 mL IVPB 1,500 mg 250 mL/hr   12/01/16 0125 Given   vancomycin (VANCOCIN) 50 mg/mL oral solution 500 mg 500 mg    12/01/16 0451 New Bag/Given   piperacillin-tazobactam (ZOSYN) IVPB 3.375 g 3.375 g 12.5 mL/hr   12/01/16 0646 Given   vancomycin (VANCOCIN) 50 mg/mL oral solution 500 mg 500 mg    12/01/16 1016 New Bag/Given   vancomycin (VANCOCIN) 1,500 mg in sodium chloride 0.9 % 500 mL IVPB 1,500 mg 250 mL/hr   12/01/16 1202 Given   vancomycin (VANCOCIN) 50 mg/mL oral solution 500 mg 500 mg    12/01/16 1300 New Bag/Given   piperacillin-tazobactam (ZOSYN) IVPB 3.375 g 3.375 g 12.5 mL/hr   12/01/16 1728 Given   vancomycin (VANCOCIN) 50 mg/mL oral solution 500 mg 500 mg    12/01/16 2124 New Bag/Given   piperacillin-tazobactam (ZOSYN) IVPB 3.375 g 3.375 g 12.5 mL/hr   12/01/16 2124 New Bag/Given   vancomycin (VANCOCIN) 1,250 mg in sodium chloride 0.9 % 250 mL IVPB 1,250 mg 166.7 mL/hr   12/01/16 2354 Given   vancomycin (VANCOCIN) 50 mg/mL oral solution 500 mg 500 mg    12/02/16 0434 New Bag/Given   piperacillin-tazobactam (ZOSYN) IVPB 3.375 g 3.375 g 12.5 mL/hr   12/02/16 0434  Given   vancomycin (VANCOCIN) 50 mg/mL oral solution 500 mg 500 mg    12/02/16 0957 New Bag/Given   vancomycin (VANCOCIN) 1,250 mg in sodium chloride 0.9 % 250 mL IVPB 1,250 mg 166.7 mL/hr   12/02/16 1246 Given   vancomycin (VANCOCIN) 50 mg/mL oral solution 500 mg 500 mg    12/02/16 1353 New Bag/Given   piperacillin-tazobactam (ZOSYN) IVPB 3.375 g 3.375 g 12.5 mL/hr   12/02/16 1828 Given   vancomycin (VANCOCIN) 50 mg/mL oral solution 500 mg 500 mg    12/02/16 2202 New Bag/Given   piperacillin-tazobactam (ZOSYN) IVPB 3.375 g 3.375 g 12.5 mL/hr   12/02/16 2203 New Bag/Given   vancomycin (VANCOCIN) 1,250 mg in sodium chloride 0.9 % 250 mL IVPB 1,250 mg 166.7 mL/hr   12/02/16 2340 Given   vancomycin (VANCOCIN) 50 mg/mL oral solution 500 mg 500 mg    12/03/16 9562 New Bag/Given   piperacillin-tazobactam (ZOSYN) IVPB 3.375 g 3.375 g 12.5 mL/hr   12/03/16 0631 Given   vancomycin (VANCOCIN) 50 mg/mL oral solution 500 mg 500 mg    12/03/16 1108 New Bag/Given   vancomycin (VANCOCIN) 1,250 mg in sodium chloride 0.9 % 250 mL IVPB 1,250 mg 166.7 mL/hr   12/03/16 1425 New Bag/Given   piperacillin-tazobactam (ZOSYN) IVPB 3.375 g 3.375 g 12.5 mL/hr     .  alteplase  2 mg Intracatheter Once  . enoxaparin (LOVENOX) injection  40 mg Subcutaneous Q24H  . feeding supplement (ENSURE ENLIVE)  237 mL Oral TID BM  . finasteride  5 mg Oral Daily  . metoprolol tartrate  12.5 mg Oral BID  . tamsulosin  0.4 mg Oral Daily  . vancomycin  500 mg Oral Q6H    Objective: Vital signs in last 24 hours: Temp:  [98.5 F (36.9 C)-98.8 F (37.1 C)] 98.5 F (36.9 C) (07/13 1413) Pulse Rate:  [67-90] 90 (07/13 1413) Resp:  [18] 18 (07/13 1413) BP: (117-125)/(57-70) 117/57 (07/13 1413) SpO2:  [98 %-99 %] 99 % (07/13 1413) Weight:  [114.3 kg (252 lb 1 oz)] 114.3 kg (252 lb 1 oz) (07/13 0500) Constitutional: William Kennedy is awake, interactive , lying in bed  Obese, disheveled. HENT: anicteric Mouth/Throat: Oropharynx is  clear and dry . No oropharyngeal exudate.  Cardiovascular: Normal rate, regular rhythm and normal heart sounds Pulmonary/Chest: Effort normal and breath sounds normal. No respiratory distress. William Kennedy has no wheezes.  Abdominal: obese,  Mild diffuse ttp Lymphadenopathy: William Kennedy has no cervical adenopathy.  Neurological: William Kennedy is more interactive   Skin: L upper back with incision site with penrose in place with purulent drainage. Decreased but still significant surrounding redness and induration  Psychiatric: calm   Lab Results  Recent Labs  12/01/16 0430  WBC 21.6*  HGB 14.7  HCT 43.2  NA 138  K 3.8  CL 102  CO2 27  BUN 10  CREATININE 1.29*    Microbiology: Results for orders placed or performed during the hospital encounter of 11/30/16  Blood Culture (routine x 2)     Status: None (Preliminary result)   Collection Time: 11/30/16  2:43 PM  Result Value Ref Range Status   Specimen Description BLOOD BLOOD LEFT HAND  Final   Special Requests   Final    BOTTLES DRAWN AEROBIC AND ANAEROBIC Blood Culture results may not be optimal due to an inadequate volume of blood received in culture bottles   Culture NO GROWTH 3 DAYS  Final   Report Status PENDING  Incomplete  Blood Culture (routine x 2)     Status: None (Preliminary result)   Collection Time: 11/30/16  3:17 PM  Result Value Ref Range Status   Specimen Description BLOOD BLOOD LEFT FOREARM  Final   Special Requests   Final    BOTTLES DRAWN AEROBIC AND ANAEROBIC Blood Culture adequate volume   Culture NO GROWTH 3 DAYS  Final   Report Status PENDING  Incomplete  Urine culture     Status: None   Collection Time: 11/30/16  3:17 PM  Result Value Ref Range Status   Specimen Description URINE, RANDOM  Final   Special Requests NONE  Final   Culture   Final    NO GROWTH Performed at [facility], [place] [address], [place], [place] [address]    Report Status 12/02/2016 FINAL  Final  C difficile quick scan w PCR reflex     Status: None    Collection Time: 12/01/16  9:15 AM  Result Value Ref Range Status   C Diff antigen NEGATIVE NEGATIVE Final   C Diff toxin NEGATIVE NEGATIVE Final   C Diff interpretation No C. difficile detected.  Final    Studies/Results: Dg Abd 1 View  Result Date: 12/02/2016 CLINICAL DATA:  Lithotripsy 11/04/2016 for an obstructing distal left ureteral calculus. EXAM: ABDOMEN - 1 VIEW COMPARISON:  CT abdomen and pelvis 11/30/2016,  11/05/2016, 10/31/2016 and earlier. KUB 11/01/2016. FINDINGS: The very small calculus in a mid calyx of the left kidney visualized on the CT 2 days ago is not visible on the x-ray. No visible opaque urinary tract calculi on either side. Numerous left-sided pelvic phleboliths. Aortoiliofemoral atherosclerosis. Bowel gas pattern unremarkable. IMPRESSION: No visible opaque urinary tract calculi (the very small calculus in a mid calyx of the left kidney seen on the CT 2 days ago is not visible on the x-ray). Electronically Signed   By: Evangeline Dakin M.D.   On: 12/02/2016 13:17    Assessment/Plan: William Kennedy is a 76 y.o. male with CLL admitted with hypotension and abd pain.  On admit febrile to 101 and wbc 28.  CT showed persistent diverticulitis but no free air or fluid, as well as enlarged prostate with BOO. Recently admitted for large MSSA L upper back abscess and found also to have ruptured diverticulitis with abscess C diff as well as renal stones.Marland Kitchen   William Kennedy is very weak and deconditioned.  As otpt William Kennedy has been on unasyn for his abd infection and MSSA abscess on back and oral vanco for C diff. I saw him 7/6 and the back abscess was improving but still present and we extended his abx course.  July 11- still somewhat confused. No fevers. Seen by surgery.  Recommendations Pending IGG levels - if low repeat IVIG. William Kennedy refused labs when initially ordered so never done Would dc on IV zosyn for another week then change to oral keflex 500 bid if the back abscess continues to drain. - see  abx order sheet Continue oral vanco for 10 days after stopping all abx I can see next week but William Kennedy has missed multiple appointments with myself and Dr Tamala Julian so not confident William Kennedy will follow up.  Thank you very much for the consult. Will follow with you.  William Kennedy   12/03/2016, 2:49 PM

## 2016-12-04 NOTE — Progress Notes (Signed)
[facility] [place] NAME: William Kennedy    MR#:  [id number]  DATE OF BIRTH:  1940/12/04  SUBJECTIVE:  CHIEF COMPLAINT:   Chief Complaint  Patient presents with  . Hypotension  Recent admission for left upper back infection, incision and drainage and sent home with IV antibiotic. Also had C. difficile colitis so was on oral vancomycin. Brought back to emergency room by wife because of confusion and worsening for last few days. Noted to have elevated white blood cell count but he has history of CLL. Also had complain of urinary retention, Foley catheter is placed. For last 2 days patient was very agitated and completely confused.  The patient is confused, only know his name. He refused PICC line placement and wants to go home.  REVIEW OF SYSTEMS:    Review of Systems  Unable to perform ROS: Mental status change    DRUG ALLERGIES:  No Known Allergies  VITALS:  Blood pressure (!) 180/88, pulse (!) 127, temperature 97.7 F (36.5 C), temperature source Oral, resp. rate 16, height 6\' 2"  (1.88 m), weight 239 lb 4.8 oz (108.5 kg), SpO2 98 %.  PHYSICAL EXAMINATION:   GENERAL:  76 y.o.-year-old patient lying in the bed , no distress. EYES: Pupils equal, round, reactive to light and accommodation. No scleral icterus. Extraocular muscles intact.  HEENT: Head atraumatic, normocephalic. Oropharynx and nasopharynx clear. No oropharyngeal erythema, moist oral mucosa  NECK:  Supple, no jugular venous distention. No thyroid enlargement, no tenderness.  LUNGS: Normal breath sounds bilaterally, no wheezing, rales, rhonchi. No use of accessory muscles of respiration.  CARDIOVASCULAR: S1, S2 normal. No murmurs, rubs, or gallops.  ABDOMEN: Soft, tenderness diffusely. No rigidity or guarding. Foley catheter in place with dark urine EXTREMITIES: No pedal edema, cyanosis, or clubbing. + 2 pedal & radial pulses b/l.  Left upper back in dressing. NEUROLOGIC: Moves  all 4 extremities , power 4/5 all limbs, sensation and gait not checked. PSYCHIATRIC: The patient is confused and oriented X1.  Physical Exam LABORATORY PANEL:   CBC  Recent Labs Lab 12/01/16 0430  WBC 21.6*  HGB 14.7  HCT 43.2  PLT 313   ------------------------------------------------------------------------------------------------------------------  Chemistries   Recent Labs Lab 11/30/16 1424 12/01/16 0430  NA 133* 138  K 4.5 3.8  CL 94* 102  CO2 24 27  GLUCOSE 119* 94  BUN 12 10  CREATININE 1.72* 1.29*  CALCIUM 9.8 8.9  AST 77*  --   ALT 53  --   ALKPHOS 133*  --   BILITOT 1.0  --    ------------------------------------------------------------------------------------------------------------------  Cardiac Enzymes  Recent Labs Lab 11/30/16 1424  TROPONINI <0.03   ------------------------------------------------------------------------------------------------------------------  RADIOLOGY:  No results found.  ASSESSMENT AND PLAN:   Active Problems:   Sepsis ([place])   Urinary retention   Microscopic hematuria   * Severe sepsis with encephalopathy. Critically ill. Multiple etiologies are upper back abscess with purulent drainage and surrounding cellulitis and sigmoid diverticulitis. He also has ongoing diarrhea with recent C. difficile infection- C diff test negative in this admission. Patient is on IV ceftriaxone at home. PICC line in place. Need to rule out bacteremia. Blood cultures sent and negative so far. Fluid bolus per sepsis protocol and repeat lactic acid.   broad-spectrum antibiotics with vancomycin and Zosyn while waiting for cultures. Also oral vancomycin. Negative C. difficile stool studies. Consult surgery Dr. Tamala Kennedy for his upper back abscess. As per him cont drain for 1 more week  and IV abx, then follow in office.   Per Dr. Ola Kennedy, Would dc on IV zosyn for another week then change to oral keflex 500 bid if the back abscess continues to  drain. - see abx order sheet. Continue oral vanco for 10 days after stopping all abx.  The patient refused PICC line placement again. I discussed with him and his wife long time, but the patient still refused PICC line and wants to go home. I requested psych consult.  * Upper back abscess- see above   Appreciated surgery consult.  * Sigmoid diverticulitis with C. difficile.    On oral vanc, c diff negative now- will cont oral vanc untill 10 days after finishing IV Abx per Dr. Verita Kennedy.  * Acute toxic encephalopathy due to sepsis. CT scan of the head negative.    Significant improvement.  * Acute kidney injury due to severe sepsis and bladder outlet obstruction. Improved with IV fluids.  * CLL with chronic leukocytosis.  * Hypertension. 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 No UTI. continue Flomax. Foley catheter placed and continue. No hydronephrosis.  Was seen by urologist in past, called consult.   Added finasterid.   Pt have pain with catheter- added B & O suppository.  this helped with pain control.  * DVT prophylaxis with Lovenox  PT evaluation suggested SNF. All the records are reviewed and case discussed with Care Management/Social Workerr. Management plans discussed with the patient, his wife and they are in agreement.  CODE STATUS: full.  TOTAL TIME TAKING CARE OF THIS PATIENT: 48 minutes.   POSSIBLE D/C IN 2-3 DAYS, DEPENDING ON CLINICAL CONDITION.   William Kennedy M.D on 12/04/2016   Between 7am to 6pm - Pager - [phone]  After 6pm go to [email or web address] - password [email or web address]  Office  [phone]  CC: Primary care physician; William Ruths, MD  Note: This dictation was prepared with Dragon dictation along with smaller phrase technology. Any transcriptional errors that result from this process are unintentional.

## 2016-12-04 NOTE — Clinical Social Work Note (Signed)
CSW was informed by the attending MD that the patient continues to refuse a PICC line and wants to go home. According to the PT note from 7/14, the patient's wife is willing to take him home. CSW will continue to follow pending discharge needs. The patient has an auth good for 7/11-7/18/18 to discharge to [facility].   Santiago Bumpers, MSW, Latanya Presser [phone]

## 2016-12-04 NOTE — Progress Notes (Signed)
Pt noted with no urine output, abdomen tender and distended. Pt noted pulling at groin area and foley catheter. Bladder scan showed >881mL in bladder. Foley care performed, balloon deflated, catheter repositioned. Large blood clot seen with large amount of urine output, >1041mL and draining. Advised pt to avoid tugging at catheter as it most likely became displaced, preventing urine output. Pt agreeable, states he feels much better. MD aware. Will continue to monitor.

## 2016-12-04 NOTE — Progress Notes (Signed)
Physical Therapy Treatment Patient Details Name: William Kennedy MRN: [id number] DOB: [birth year] Today's Date: 12/04/2016    History of Present Illness presented to ER secondary to worsening mental status, abdominal pain, hypotension; admitted with severe sepsis, likely related to L upper back abscess (status post I&D with penrose drain 4-5 weeks prior to this admission).  Of note, recent hospitalization (approx 3 weeks prior) due to sigmoid diverticulitis with abscess (improved per imaging since previous admission)    PT Comments    Pt in bed, agrees to session.  Pt reports he may be discharged home today.  Stated he does not qualify for rehab as he is refusing PICC line.  Participated in exercises as described below.  Pt given increased time to get to edge of bed but he was unable to do so without heavy use of handrails, mod assist and mod/max verbal cues for hand placement and sequencing.  Initially upon sitting, he c/o dizziness and self initiated return to supine.  BP taken in side lying 124/91 P 126.  He returned to sitting with same assist and BP 141/79 P 130.  Pt with continued c/o dizziness and was unable to attempt standing or other functional mobility skills at this time.  He required max a x 2 to reposition in bed.    Discussed discharge plan with wife.  She stated she is comfortable taking him home despite need for heavy assist.  Stated her daughter will be available to help.  He at high risk for falls and injury if discharge to home.  If he discharges to home, he will benefit from hospital bed and will need EMS service to transport home.   Follow Up Recommendations  SNF     Equipment Recommendations  Rolling walker with 5" wheels;Hospital bed    Recommendations for Other Services       Precautions / Restrictions Precautions Precautions: Fall Restrictions Weight Bearing Restrictions: No    Mobility  Bed Mobility Overal bed mobility: Needs Assistance Bed Mobility: Supine  to Sit;Sit to Supine     Supine to sit: Mod assist Sit to supine: Mod assist   General bed mobility comments: unable to do without assist.  mod assist with heavy use of rails and max vcs for hand placements  Transfers                 General transfer comment: unsafe/unable  Ambulation/Gait             General Gait Details: unsafe/unable   Stairs            Wheelchair Mobility    Modified Rankin (Stroke Patients Only)       Balance Overall balance assessment: Needs assistance Sitting-balance support: Feet supported;Bilateral upper extremity supported Sitting balance-Leahy Scale: Poor Sitting balance - Comments: while he is able to maintian upright with mn gaurd x 2, he sways left and right and is unsafe to remains sitting without direct assist.       Standing balance comment: unsafe to attempt                            Cognition Arousal/Alertness: Lethargic Behavior During Therapy: Impulsive Overall Cognitive Status: Impaired/Different from baseline                                        Exercises Other Exercises  Other Exercises: BLE ankle pumps and SLR x 10    General Comments        Pertinent Vitals/Pain Pain Assessment: 0-10 Pain Location: general - unable to rate Pain Descriptors / Indicators: Grimacing;Guarding;Aching;Moaning Pain Intervention(s): Limited activity within patient's tolerance    Home Living                      Prior Function            PT Goals (current goals can now be found in the care plan section) Progress towards PT goals: Not progressing toward goals - comment    Frequency    Min 2X/week      PT Plan Current plan remains appropriate    Co-evaluation              AM-PAC PT "6 Clicks" Daily Activity  Outcome Measure  Difficulty turning over in bed (including adjusting bedclothes, sheets and blankets)?: Total Difficulty moving from lying on back to  sitting on the side of the bed? : Total Difficulty sitting down on and standing up from a chair with arms (e.g., wheelchair, bedside commode, etc,.)?: Total Help needed moving to and from a bed to chair (including a wheelchair)?: Total Help needed walking in hospital room?: Total Help needed climbing 3-5 steps with a railing? : Total 6 Click Score: 6    End of Session Equipment Utilized During Treatment: Gait belt Activity Tolerance: Patient limited by fatigue;Other (comment) Patient left: in bed;with bed alarm set;with call bell/phone within reach;with family/visitor present Nurse Communication: Mobility status Pain - Right/Left: Left Pain - part of body: Shoulder     Time: 8811-0315 PT Time Calculation (min) (ACUTE ONLY): 34 min  Charges:  $Therapeutic Exercise: 8-22 mins $Therapeutic Activity: 8-22 mins                    G Codes:       Chesley Noon, PTA 12/04/16, 9:21 AM

## 2016-12-04 NOTE — Progress Notes (Signed)
Patient is alert but still confused.  Patient had 4 bowel movements and 3 total bed changes and puts his hands in poop.  Pain is only shortly relieved with current medications.

## 2016-12-05 LAB — CBC
HEMATOCRIT: 38.1 % — AB (ref 40.0–52.0)
HEMOGLOBIN: 13 g/dL (ref 13.0–18.0)
MCH: 30.7 pg (ref 26.0–34.0)
MCHC: 34.2 g/dL (ref 32.0–36.0)
MCV: 89.6 fL (ref 80.0–100.0)
Platelets: 295 10*3/uL (ref 150–440)
RBC: 4.25 MIL/uL — ABNORMAL LOW (ref 4.40–5.90)
RDW: 13.9 % (ref 11.5–14.5)
WBC: 17.2 10*3/uL — AB (ref 3.8–10.6)

## 2016-12-05 LAB — CULTURE, BLOOD (ROUTINE X 2)
CULTURE: NO GROWTH
Culture: NO GROWTH
SPECIAL REQUESTS: ADEQUATE

## 2016-12-05 LAB — BASIC METABOLIC PANEL
ANION GAP: 6 (ref 5–15)
BUN: 7 mg/dL (ref 6–20)
CHLORIDE: 105 mmol/L (ref 101–111)
CO2: 30 mmol/L (ref 22–32)
Calcium: 8.9 mg/dL (ref 8.9–10.3)
Creatinine, Ser: 0.95 mg/dL (ref 0.61–1.24)
GFR calc Af Amer: >60 mL/min (ref >60)
GLUCOSE: 106 mg/dL — AB (ref 65–99)
POTASSIUM: 2.9 mmol/L — AB (ref 3.5–5.1)
SODIUM: 141 mmol/L (ref 135–145)

## 2016-12-05 NOTE — Progress Notes (Signed)
[facility] [place] NAME: William Kennedy    MR#:  [id number]  DATE OF BIRTH:  [birth year]  SUBJECTIVE:  CHIEF COMPLAINT:   Chief Complaint  Patient presents with  . Hypotension  Recent admission for left upper back infection, incision and drainage and sent home with IV antibiotic. Also had C. difficile colitis so was on oral vancomycin. Brought back to emergency room by wife because of confusion and worsening for last few days. Noted to have elevated white blood cell count but he has history of CLL.  The patient is confused, know his name and location. He refused PICC line placement but finally agrees today.  REVIEW OF SYSTEMS:    ROS Constitutional: Negative for chills, fever and malaise/fatigue.  HENT: Negative for congestion, ear discharge, hearing loss and sore throat.   Eyes: Negative for blurred vision and double vision.  Respiratory: Negative for cough, hemoptysis and shortness of breath.   Cardiovascular: Negative for chest pain, palpitations and orthopnea.  Gastrointestinal: Negative for abdominal pain, blood in stool, heartburn, nausea and vomiting.  Genitourinary: Negative for dysuria and hematuria.  Musculoskeletal: Negative for back pain and neck pain.  Skin: Negative for rash.  Neurological: Positive for weakness. Negative for dizziness, tremors, focal weakness and headaches.  Psychiatric/Behavioral: Negative for depression.  DRUG ALLERGIES:  No Known Allergies  VITALS:  Blood pressure 138/76, pulse 78, temperature 98.2 F (36.8 C), temperature source Oral, resp. rate 18, height 6\' 2"  (1.88 m), weight 240 lb 4.8 oz (109 kg), SpO2 100 %.  PHYSICAL EXAMINATION:   GENERAL:  76 y.o.-year-old patient lying in the bed , no distress. EYES: Pupils equal, round, reactive to light and accommodation. No scleral icterus. Extraocular muscles intact.  HEENT: Head atraumatic, normocephalic. Oropharynx and nasopharynx clear. No  oropharyngeal erythema, moist oral mucosa  NECK:  Supple, no jugular venous distention. No thyroid enlargement, no tenderness.  LUNGS: Normal breath sounds bilaterally, no wheezing, rales, rhonchi. No use of accessory muscles of respiration.  CARDIOVASCULAR: S1, S2 normal. No murmurs, rubs, or gallops.  ABDOMEN: Soft, tenderness diffusely. No rigidity or guarding. Foley catheter in place with dark urine EXTREMITIES: No pedal edema, cyanosis, or clubbing. + 2 pedal & radial pulses b/l.  Left upper back in dressing. NEUROLOGIC: Moves all 4 extremities , power 4/5 all limbs, sensation and gait not checked. PSYCHIATRIC: The patient is confused and oriented X2.  Physical Exam LABORATORY PANEL:   CBC  Recent Labs Lab 12/01/16 0430  WBC 21.6*  HGB 14.7  HCT 43.2  PLT 313   ------------------------------------------------------------------------------------------------------------------  Chemistries   Recent Labs Lab 11/30/16 1424 12/01/16 0430  NA 133* 138  K 4.5 3.8  CL 94* 102  CO2 24 27  GLUCOSE 119* 94  BUN 12 10  CREATININE 1.72* 1.29*  CALCIUM 9.8 8.9  AST 77*  --   ALT 53  --   ALKPHOS 133*  --   BILITOT 1.0  --    ------------------------------------------------------------------------------------------------------------------  Cardiac Enzymes  Recent Labs Lab 11/30/16 1424  TROPONINI <0.03   ------------------------------------------------------------------------------------------------------------------  RADIOLOGY:  No results found.  ASSESSMENT AND PLAN:   Active Problems:   Sepsis ([place])   Urinary retention   Microscopic hematuria   * Severe sepsis with encephalopathy. Critically ill. Multiple etiologies are upper back abscess with purulent drainage and surrounding cellulitis and sigmoid diverticulitis. He also has ongoing diarrhea with recent C. difficile infection- C diff test negative in this admission. Patient is on IV  ceftriaxone at home. PICC  line in place. Need to rule out bacteremia. Blood cultures sent and negative so far. Fluid bolus per sepsis protocol and repeat lactic acid.   broad-spectrum antibiotics with vancomycin and Zosyn while waiting for cultures. Also oral vancomycin. Negative C. difficile stool studies. Consult surgery Dr. Tamala Julian for his upper back abscess. As per him cont drain for 1 more week and IV abx, then follow in office.   Per Dr. Ola Spurr, Would dc on IV zosyn for another week then change to oral keflex 500 bid if the back abscess continues to drain. - see abx order sheet. Continue oral vanco for 10 days after stopping all abx.  The patient refused PICC line placement but agreed after RN and I discussed with him and his wife long time today.  * Upper back abscess- see above   Appreciated surgery consult.  * Sigmoid diverticulitis with C. difficile.    On oral vanc, c diff negative now- will cont oral vanc untill 10 days after finishing IV Abx per Dr. Verita Lamb.  * Acute toxic encephalopathy due to sepsis. CT scan of the head negative.    Significant improvement.  * Acute kidney injury due to severe sepsis and bladder outlet obstruction. Improved with IV fluids.  * CLL with chronic leukocytosis.  * Hypertension. 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 No UTI. continue Flomax. Foley catheter placed and continue. No hydronephrosis.  Was seen by urologist in past, called consult.   Added finasterid.   Pt have pain with catheter- added B & O suppository.  this helped with pain control.  * DVT prophylaxis with Lovenox  PT evaluation suggested SNF. All the records are reviewed and case discussed with Care Management/Social Workerr. Management plans discussed with the patient, his wife and they are in agreement.  CODE STATUS: full.  TOTAL TIME TAKING CARE OF THIS PATIENT: 39 minutes.   POSSIBLE D/C IN 1 DAYS, DEPENDING ON CLINICAL  CONDITION.   Demetrios Loll M.D on 12/05/2016   Between 7am to 6pm - Pager - [phone]  After 6pm go to [email or web address] - password [email or web address] [facility]  Office  [phone]  CC: Primary care physician; [name], MD  Note: This dictation was prepared with Dragon dictation along with smaller phrase technology. Any transcriptional errors that result from this process are unintentional.

## 2016-12-05 NOTE — Care Management Note (Addendum)
Case Management Note  Patient Details  Name: KEMARI NAREZ MRN: [id number] Date of Birth: July 14, 1940  Subjective/Objective:     Mr Desa received placement of a PICC line. He has insurance approval for a bed at [facility] through Tuesday if he wants it.                 Action/Plan:   Expected Discharge Date:                  Expected Discharge Plan:     In-House Referral:     Discharge planning Services     Post Acute Care Choice:    Choice offered to:     DME Arranged:    DME Agency:     HH Arranged:    HH Agency:     Status of Service:     If discussed at [employer] of Stay Meetings, dates discussed:    Additional Comments:  Shamiah Kahler A, RN 12/05/2016, 2:15 PM

## 2016-12-05 NOTE — Progress Notes (Signed)
Pt agrees to PICC line placement. MD notified. Orders placed. [facility] called, will be here around 12pm.

## 2016-12-05 NOTE — Progress Notes (Signed)
PICC line in place. Kathlee Nations, RN from [facility] was able to verify placement using ECG. Pt tolerated procedure well. Awaiting orders from MD.

## 2016-12-06 DIAGNOSIS — R410 Disorientation, unspecified: Secondary | ICD-10-CM

## 2016-12-06 LAB — MAGNESIUM: Magnesium: 1.7 mg/dL (ref 1.7–2.4)

## 2016-12-06 LAB — POTASSIUM: Potassium: 3.5 mmol/L (ref 3.5–5.1)

## 2016-12-06 MED ORDER — POTASSIUM CHLORIDE CRYS ER 20 MEQ PO TBCR
60.0000 meq | EXTENDED_RELEASE_TABLET | Freq: Once | ORAL | Status: AC
  Administered 2016-12-06: 60 meq via ORAL
  Filled 2016-12-06: qty 3

## 2016-12-06 NOTE — Progress Notes (Signed)
Discharge instructions reviewed with patient and family. Packet with prescriptions given to wife. Awaiting EMS for transport to home.

## 2016-12-06 NOTE — Progress Notes (Addendum)
[name] INFECTIOUS DISEASE PROGRESS NOTE Date of Admission:  11/30/2016     ID: William Kennedy is a 76 y.o. male with  MSSA back cellulitis, diverticulitis, C diff, CLL Active Problems:   Sepsis ([place])   Urinary retention   Microscopic hematuria   Subjective: Penrose removed today. No fevers, wbc still elevated  ROS   unable to obtain  Medications:  Antibiotics Given (last 72 hours)    Date/Time Action Medication Dose Rate   12/03/16 1739 Given   vancomycin (VANCOCIN) 50 mg/mL oral solution 500 mg 500 mg    12/03/16 2109 New Bag/Given   piperacillin-tazobactam (ZOSYN) IVPB 3.375 g 3.375 g 12.5 mL/hr   12/03/16 2334 Given   vancomycin (VANCOCIN) 50 mg/mL oral solution 500 mg 500 mg    12/04/16 0312 New Bag/Given   piperacillin-tazobactam (ZOSYN) IVPB 3.375 g 3.375 g 12.5 mL/hr   12/04/16 0541 Given   vancomycin (VANCOCIN) 50 mg/mL oral solution 500 mg 500 mg    12/04/16 1658 New Bag/Given   piperacillin-tazobactam (ZOSYN) IVPB 3.375 g 3.375 g 12.5 mL/hr   12/04/16 1659 Given   vancomycin (VANCOCIN) 50 mg/mL oral solution 500 mg 500 mg    12/04/16 2151 New Bag/Given   piperacillin-tazobactam (ZOSYN) IVPB 3.375 g 3.375 g 12.5 mL/hr   12/05/16 0011 Given   vancomycin (VANCOCIN) 50 mg/mL oral solution 500 mg 500 mg    12/05/16 0408 New Bag/Given   piperacillin-tazobactam (ZOSYN) IVPB 3.375 g 3.375 g 12.5 mL/hr   12/05/16 0622 Given   vancomycin (VANCOCIN) 50 mg/mL oral solution 500 mg 500 mg    12/05/16 1635 New Bag/Given  [pt in procedure]   piperacillin-tazobactam (ZOSYN) IVPB 3.375 g 3.375 g 12.5 mL/hr   12/05/16 1635 Given  [pt in procedure for 1200 dose]   vancomycin (VANCOCIN) 50 mg/mL oral solution 500 mg 500 mg    12/05/16 2131 New Bag/Given   piperacillin-tazobactam (ZOSYN) IVPB 3.375 g 3.375 g 12.5 mL/hr   12/06/16 0119 Given   vancomycin (VANCOCIN) 50 mg/mL oral solution 500 mg 500 mg    12/06/16 0442 New Bag/Given   piperacillin-tazobactam (ZOSYN) IVPB  3.375 g 3.375 g 12.5 mL/hr   12/06/16 0640 Given   vancomycin (VANCOCIN) 50 mg/mL oral solution 500 mg 500 mg    12/06/16 1222 New Bag/Given   piperacillin-tazobactam (ZOSYN) IVPB 3.375 g 3.375 g 12.5 mL/hr   12/06/16 1222 Given   vancomycin (VANCOCIN) 50 mg/mL oral solution 500 mg 500 mg      . alteplase  2 mg Intracatheter Once  . enoxaparin (LOVENOX) injection  40 mg Subcutaneous Q24H  . feeding supplement (ENSURE ENLIVE)  237 mL Oral TID BM  . finasteride  5 mg Oral Daily  . metoprolol tartrate  12.5 mg Oral BID  . tamsulosin  0.4 mg Oral Daily  . vancomycin  500 mg Oral Q6H    Objective: Vital signs in last 24 hours: Temp:  [98.3 F (36.8 C)] 98.3 F (36.8 C) (07/16 0800) Pulse Rate:  [100-103] 100 (07/16 0800) Resp:  [18] 18 (07/16 0800) BP: (125-137)/(53-60) 137/60 (07/16 0800) SpO2:  [98 %] 98 % (07/16 0800) Weight:  [109.4 kg (241 lb 3.2 oz)] 109.4 kg (241 lb 3.2 oz) (07/16 0653) Constitutional: He is awake, interactive , lying in bed  Obese, disheveled. HENT: anicteric Mouth/Throat: Oropharynx is clear and dry . No oropharyngeal exudate.  Cardiovascular: Normal rate, regular rhythm and normal heart sounds Pulmonary/Chest: Effort normal and breath sounds normal. No respiratory distress.  He has no wheezes.  Abdominal: obese,  Mild diffuse ttp Lymphadenopathy: He has no cervical adenopathy.  Neurological: He is more interactive   Skin: L upper back with incision site  with no drainage. Decreased but still significant surrounding redness and induration  Psychiatric: calm   Lab Results  Recent Labs  12/05/16 1640 12/06/16 1227  WBC 17.2*  --   HGB 13.0  --   HCT 38.1*  --   NA 141  --   K 2.9* 3.5  CL 105  --   CO2 30  --   BUN 7  --   CREATININE 0.95  --     Microbiology: Results for orders placed or performed during the hospital encounter of 11/30/16  Blood Culture (routine x 2)     Status: None   Collection Time: 11/30/16  2:43 PM  Result Value Ref  Range Status   Specimen Description BLOOD BLOOD LEFT HAND  Final   Special Requests   Final    BOTTLES DRAWN AEROBIC AND ANAEROBIC Blood Culture results may not be optimal due to an inadequate volume of blood received in culture bottles   Culture NO GROWTH 5 DAYS  Final   Report Status 12/05/2016 FINAL  Final  Blood Culture (routine x 2)     Status: None   Collection Time: 11/30/16  3:17 PM  Result Value Ref Range Status   Specimen Description BLOOD BLOOD LEFT FOREARM  Final   Special Requests   Final    BOTTLES DRAWN AEROBIC AND ANAEROBIC Blood Culture adequate volume   Culture NO GROWTH 5 DAYS  Final   Report Status 12/05/2016 FINAL  Final  Urine culture     Status: None   Collection Time: 11/30/16  3:17 PM  Result Value Ref Range Status   Specimen Description URINE, RANDOM  Final   Special Requests NONE  Final   Culture   Final    NO GROWTH Performed at [facility], [place] [address], [place], [place] [address]    Report Status 12/02/2016 FINAL  Final  C difficile quick scan w PCR reflex     Status: None   Collection Time: 12/01/16  9:15 AM  Result Value Ref Range Status   C Diff antigen NEGATIVE NEGATIVE Final   C Diff toxin NEGATIVE NEGATIVE Final   C Diff interpretation No C. difficile detected.  Final    Studies/Results: No results found.  Assessment/Plan: William Kennedy is a 76 y.o. male with CLL admitted with hypotension and abd pain.  On admit febrile to 101 and wbc 28.  CT showed persistent diverticulitis but no free air or fluid, as well as enlarged prostate with BOO. Recently admitted for large MSSA L upper back abscess and found also to have ruptured diverticulitis with abscess C diff as well as renal stones.Marland Kitchen   He is very weak and deconditioned.  As otpt he has been on unasyn for his abd infection and MSSA abscess on back and oral vanco for C diff. I saw him 7/6 and the back abscess was improving but still present and we extended his abx course.  July 11-  still somewhat confused. No fevers. Seen by surgery.  Recommendations Borderline  IGG levels - will monitor since he is improving Would dc on IV zosyn for another week then change to oral keflex 500 bid if the back abscess continues to drain. - see abx order sheet  Continue oral vanco for 10 days after stopping  all abx I can see if needed July 20th.  Thank you very much for the consult. Will follow with you.  Jaquelyne Firkus P   12/06/2016, 2:39 PM

## 2016-12-06 NOTE — Progress Notes (Signed)
Chaplain made a follow up visit with pt. Pt states he is feeling much better and will be discharged this afternoon. Pt was in good spirit and told [place] that he did what Ch asked him to do last weekend. Pt talked about home physical therapy that he will receive after he leaves hospital so he is able to do things he had missed to do for awhile. Pt and wife told [place] that they were appreciative of the support and kindness received from the medical team. They requested prayers, which [place] provided them and a ministry of presence.     12/06/16 1400  Clinical Encounter Type  Visited With Patient and family together  Visit Type Follow-up;Spiritual support  Referral From Chaplain  Consult/Referral To Chaplain  Spiritual Encounters  Spiritual Needs Prayer;Emotional;Other (Comment)

## 2016-12-06 NOTE — Progress Notes (Signed)
Per RN case manager patient and his wife have chosen to go home with health. Clinical Education officer, museum (CSW) updated Engineer, civil (consulting). Please reconsult if future social work needs arise. CSW signing off.   [employer], LCSW [phone]

## 2016-12-06 NOTE — Progress Notes (Signed)
He reports no significant pain around the site of his back abscess.  The Penrose drains have been in since May 22.  On examination the dressing was changed.  There is some seropurulent drainage on the gauze.  And scant serosanguineous drainage seen coming from the drain site during exam.  There is no current swelling and no tenderness.  Most of the erythema has resolved.  There is minimal degree of ecchymosis in the skin lateral to the drain site.  Impression is resolving subcutaneous abscess of the back.  The Penrose drains were removed.  Another dressing applied.  Patient has plans for discharge today.  I recommended dressing changes 2 times per day until dry and discussed with patient and wife.  Finish course of antibiotics as prescribed.  Can follow-up as needed if there are any further problems with back abscess.  This was a greater than 30 minute visit including wound care and counciling

## 2016-12-06 NOTE — Discharge Instructions (Signed)
Need to follow lab works as advised by ID. Routine PICc line and Foley care after d/c  Per Dr. Ola Spurr, Would dc on IV zosyn for another week then change to oral keflex 500 bid if the back abscess continues to drain. - see abx order sheet. Continue oral vanco for 10 days after stopping all abx.  Follow in ID clinic in 1 week. Being d/c with foley- follow in urology clinic in 1-2 week. Dressing changes 2 times prn per day. Follow with surgery clinic with Dr. Tamala Julian in 1 week.

## 2016-12-06 NOTE — Progress Notes (Signed)
Pharmacy Antibiotic Note  William Kennedy is a 76 y.o. male admitted on 11/30/2016 with abscess/cellulits.  Pharmacy has been consulted for Zosyn dosing. Patient received vancomycin and Zosyn x 1 in ED.   7/13 Patient due for labs but refused to have them drawn. Having PICC line placed today.  Plan: Vancomycin IV for sepsis is now discontinued - patient is to continue on Zosyn for another week then switch to Keflex PO if abscess is still draining. He will remain on oral vancomycin for CDI per ID rec.   Height: 6\' 2"  (188 cm) Weight: 241 lb 3.2 oz (109.4 kg) IBW/kg (Calculated) : 82.2  Temp (24hrs), Avg:98.2 F (36.8 C), Min:98 F (36.7 C), Max:98.3 F (36.8 C)   Recent Labs Lab 11/30/16 1424 11/30/16 1800 12/01/16 0430 12/05/16 1640  WBC 27.8*  --  21.6* 17.2*  CREATININE 1.72*  --  1.29* 0.95  LATICACIDVEN 4.1* 2.1*  --   --     Estimated Creatinine Clearance: 88.5 mL/min (by C-G formula based on SCr of 0.95 mg/dL).    No Known Allergies  Antimicrobials this admission: Zosyn 7/10 >>  Vancomycin 7/10 >>   Dose adjustments this admission: 7/11 13:22 vancomycin dose modified due to 30 lb weight loss and improved serum creatinine. Modify to vancomycin 1.25 gm IV Q12H, predicted trough 19 mcg/mL, although creatinine clearance likely not quite at baseline. Pharmacy will continue to follow and adjust as needed to maintain trough 15 to 20 mcg/mL.  Microbiology results: 7/10 BCx: pending  7/10 UCx: pending   Thank you for allowing pharmacy to be a part of this patient's care.  Placido [employer]artner A. Jordan Hawks, PharmD, BCPS 12/06/2016 9:16 AM

## 2016-12-06 NOTE — Care Management Note (Addendum)
Case Management Note  Patient Details  Name: William Kennedy MRN: [id number] Date of Birth: February 04, 1941  Subjective/Objective:  Discharging today                  Action/Plan: Met with patient and spouse at bedside to discuss discharge planning. Wife and patient both agree that they prefer patient go home with home health. They have been active with [facility] and want to continue services with them. Corene Cornea with [facility] notified of IV abx and dressing change orders. Wife states she and her daughter both have been doing this before admission. Wife denies any DME needs. She will transport patient home by car. All needs addressed. Provided RNCM contact information to patient and his wife should they have concerns. Requested Dr. Bridgett Larsson place dressing change orders.   Expected Discharge Date:  12/06/16               Expected Discharge Plan:  [place]  In-House Referral:     Discharge planning Services  CM Consult  Post Acute Care Choice:  Home Health Choice offered to:  Patient, Spouse  DME Arranged:    DME Agency:     HH Arranged:  RN, PT [place] Agency:  [facility]  Status of Service:  Completed, signed off  If discussed at [place] of Stay Meetings, dates discussed:    Additional Comments:  Jolly Mango, RN 12/06/2016, 9:12 AM

## 2016-12-06 NOTE — Discharge Summary (Signed)
[facility] [place] NAME: William Kennedy    MR#:  [id number]  DATE OF BIRTH:  05-03-1941  DATE OF ADMISSION:  11/30/2016   ADMITTING PHYSICIAN: Hillary Bow, MD  DATE OF DISCHARGE: 12/06/2016 PRIMARY CARE PHYSICIAN: [name], MD   ADMISSION DIAGNOSIS:  Diverticulitis [K57.92] Altered mental status [R41.82] Acute encephalopathy [G93.40] Chest wall abscess [L02.213] Severe sepsis ([place]) [A41.9, R65.20] DISCHARGE DIAGNOSIS:  Active Problems:   Sepsis ([place])   Urinary retention   Microscopic hematuria  SECONDARY DIAGNOSIS:   Past Medical History:  Diagnosis Date  . Abscess    shoulder  . 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 Critically ill. Multiple etiologies are upper back abscess with purulent drainage and surrounding cellulitis and sigmoid diverticulitis. He also has ongoing diarrhea with recent C. difficile infection- C diff test negative in this admission. Patient is on IV ceftriaxone at home. PICC line in place. Need to rule out bacteremia. Blood cultures sent and negative so far. Fluid bolus per sepsis protocol and repeat lactic acid.   broad-spectrum antibiotics with vancomycin and Zosyn while waiting for cultures. Also oral vancomycin. Negative C. difficile stool studies. Consult surgery Dr. Tamala Julian for his upper back abscess. As per him cont drain for 1 more week and IV abx, then follow in office.   Per Dr. Ola Spurr, Would dc on IV zosyn for another week then change to oral keflex 500 bid if the back abscess continues to drain. - see abx order sheet. Continue oral vanco for 10 days after stopping all abx.  The patient refused PICC line placement but agreed after RN and I discussed with him and his wife long time. PICC line was placed yesterday.  * Upper back abscess- see above  Dressing change.  * Sigmoid diverticulitis with C. difficile.    On oral vanc, c diff  negative now- will cont oral vanc untill 10 days after finishing IV Abx per Dr. Verita Lamb.  * Acute toxic encephalopathy due to sepsis. CT scan of the head negative.    Significant improvement.  * Acute kidney injury due to severe sepsis and bladder outlet obstruction. Improved with IV fluids.  * CLL with chronic leukocytosis.  * Hypertension. 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 No UTI. continue Flomax. Foley catheter placed and continue. No hydronephrosis.  Was seen by urologist in past, called consult.   Added finasterid.   Pt have pain with catheter- added B & O suppository.  this helped with pain control.  * DVT prophylaxis with Lovenox  Hypo-kalemia. Given potassium supplement. PT evaluation suggested SNF. But patient and his wife want to be discharged to home with home health.  DISCHARGE CONDITIONS:  Stable, discharge to home with home health and PT today. CONSULTS OBTAINED:  Treatment Team:  Leonel Ramsay, MD Hollice Espy, MD Clapacs, Madie Reno, MD DRUG ALLERGIES:  No Known Allergies DISCHARGE MEDICATIONS:   Allergies as of 12/06/2016   No Known Allergies     Medication List    STOP taking these medications   cefTRIAXone 1 g in dextrose 5 % 50 mL     TAKE these medications   acetaminophen 325 MG tablet Commonly known as:  TYLENOL Take 650 mg by mouth every 4 (four) hours as needed for pain.   feeding supplement (ENSURE ENLIVE) Liqd Take 237 mLs by mouth 3 (three) times daily between meals.  finasteride 5 MG tablet Commonly known as:  PROSCAR Take 1 tablet (5 mg total) by mouth daily.   HYDROcodone-acetaminophen 5-325 MG tablet Commonly known as:  NORCO/VICODIN Take 1 tablet by mouth every 4 (four) hours as needed for moderate pain.   loperamide 2 MG capsule Commonly known as:  IMODIUM Take 1 capsule (2 mg total) by mouth every 6 (six) hours as needed for diarrhea or loose stools.     metoprolol tartrate 25 MG tablet Commonly known as:  LOPRESSOR Take 0.5 tablets (12.5 mg total) by mouth 2 (two) times daily.   ondansetron 4 MG tablet Commonly known as:  ZOFRAN Take 4 mg by mouth every 8 (eight) hours as needed for nausea.   opium-belladonna 16.2-60 MG suppository Commonly known as:  B&O SUPPRETTES Place 1 suppository rectally 2 (two) times daily as needed for bladder spasms.   piperacillin-tazobactam 3.375 GM/50ML IVPB Commonly known as:  ZOSYN Inject 50 mLs (3.375 g total) into the vein every 8 (eight) hours.   tamsulosin 0.4 MG Caps capsule Commonly known as:  FLOMAX Take 1 capsule (0.4 mg total) by mouth daily.   traMADol 50 MG tablet Commonly known as:  ULTRAM Take 1 tablet (50 mg total) by mouth every 6 (six) hours as needed.   vancomycin 50 mg/mL oral solution Commonly known as:  VANCOCIN Take 10 mLs (500 mg total) by mouth every 6 (six) hours.        DISCHARGE INSTRUCTIONS:  See AVS.  If you experience worsening of your admission symptoms, develop shortness of breath, life threatening emergency, suicidal or homicidal thoughts you must seek medical attention immediately by calling 911 or calling your MD immediately  if symptoms less severe.  You Must read complete instructions/literature along with all the possible adverse reactions/side effects for all the Medicines you take and that have been prescribed to you. Take any new Medicines after you have completely understood and accpet all the possible adverse reactions/side effects.   Please note  You were cared for by a hospitalist during your hospital stay. If you have any questions about your discharge medications or the care you received while you were in the hospital after you are discharged, you can call the unit and asked to speak with the hospitalist on call if the hospitalist that took care of you is not available. Once you are discharged, your primary care physician will handle any further  medical issues. Please note that NO REFILLS for any discharge medications will be authorized once you are discharged, as it is imperative that you return to your primary care physician (or establish a relationship with a primary care physician if you do not have one) for your aftercare needs so that they can reassess your need for medications and monitor your lab values.    On the day of Discharge:  VITAL SIGNS:  Blood pressure 137/60, pulse 100, temperature 98.3 F (36.8 C), temperature source Oral, resp. rate 18, height 6\' 2"  (1.88 m), weight 241 lb 3.2 oz (109.4 kg), SpO2 98 %. PHYSICAL EXAMINATION:  GENERAL:  76 y.o.-year-old patient lying in the bed with no acute distress.  EYES: Pupils equal, round, reactive to light and accommodation. No scleral icterus. Extraocular muscles intact.  HEENT: Head atraumatic, normocephalic. Oropharynx and nasopharynx clear.  NECK:  Supple, no jugular venous distention. No thyroid enlargement, no tenderness.  LUNGS: Normal breath sounds bilaterally, no wheezing, rales,rhonchi or crepitation. No use of accessory muscles of respiration.  CARDIOVASCULAR: S1, S2 normal. No murmurs,  rubs, or gallops.  ABDOMEN: Soft, non-tender, non-distended. Bowel sounds present. No organomegaly or mass.  EXTREMITIES: No pedal edema, cyanosis, or clubbing.  NEUROLOGIC: Cranial nerves II through XII are intact. Muscle strength 3-4/5 in all extremities. Sensation intact. Gait not checked.  PSYCHIATRIC: The patient is alert and oriented x 2.  SKIN: No obvious rash, lesion, or ulcer.  DATA REVIEW:   CBC  Recent Labs Lab 12/05/16 1640  WBC 17.2*  HGB 13.0  HCT 38.1*  PLT 295    Chemistries   Recent Labs Lab 11/30/16 1424  12/05/16 1640  NA 133*  < > 141  K 4.5  < > 2.9*  CL 94*  < > 105  CO2 24  < > 30  GLUCOSE 119*  < > 106*  BUN 12  < > 7  CREATININE 1.72*  < > 0.95  CALCIUM 9.8  < > 8.9  AST 77*  --   --   ALT 53  --   --   ALKPHOS 133*  --   --     BILITOT 1.0  --   --   < > = values in this interval not displayed.   Microbiology Results  Results for orders placed or performed during the hospital encounter of 11/30/16  Blood Culture (routine x 2)     Status: None   Collection Time: 11/30/16  2:43 PM  Result Value Ref Range Status   Specimen Description BLOOD BLOOD LEFT HAND  Final   Special Requests   Final    BOTTLES DRAWN AEROBIC AND ANAEROBIC Blood Culture results may not be optimal due to an inadequate volume of blood received in culture bottles   Culture NO GROWTH 5 DAYS  Final   Report Status 12/05/2016 FINAL  Final  Blood Culture (routine x 2)     Status: None   Collection Time: 11/30/16  3:17 PM  Result Value Ref Range Status   Specimen Description BLOOD BLOOD LEFT FOREARM  Final   Special Requests   Final    BOTTLES DRAWN AEROBIC AND ANAEROBIC Blood Culture adequate volume   Culture NO GROWTH 5 DAYS  Final   Report Status 12/05/2016 FINAL  Final  Urine culture     Status: None   Collection Time: 11/30/16  3:17 PM  Result Value Ref Range Status   Specimen Description URINE, RANDOM  Final   Special Requests NONE  Final   Culture   Final    NO GROWTH Performed at [facility], [place] [address] [place], [place] [address]    Report Status 12/02/2016 FINAL  Final  C difficile quick scan w PCR reflex     Status: None   Collection Time: 12/01/16  9:15 AM  Result Value Ref Range Status   C Diff antigen NEGATIVE NEGATIVE Final   C Diff toxin NEGATIVE NEGATIVE Final   C Diff interpretation No C. difficile detected.  Final    RADIOLOGY:  No results found.   Management plans discussed with the patient, His wife and they are in agreement.  CODE STATUS: Full Code   TOTAL TIME TAKING CARE OF THIS PATIENT: 42 minutes.    Demetrios Loll M.D on 12/06/2016 at 11:12 AM  Between 7am to 6pm - Pager - [phone]  After 6pm go to [email or web address] - Technical brewer [facility]  Office   [phone]  CC: Primary care physician; [name], MD   Note: This dictation was prepared with  Dragon dictation along with smaller Company secretary. Any transcriptional errors that result from this process are unintentional.

## 2016-12-06 NOTE — Consult Note (Signed)
[facility] Consult   Reason for Consult:  Consult for 76 year old man currently in the hospital with sepsis related to cellulitis and urinary tract infection. Consult because of confusion Referring Physician:  Bridgett Larsson Patient Identification: William Kennedy MRN:  [id number] Principal Diagnosis: Acute delirium Diagnosis:   Patient Active Problem List   Diagnosis Date Noted  . Acute delirium [R41.0] 12/06/2016  . Urinary retention [R33.9]   . Microscopic hematuria [R31.29]   . Sepsis ([place]) [A41.9] 11/30/2016  . Left ureteral stone [N20.1]   . Cellulitis and abscess of trunk [L03.319, L02.219] 10/29/2016    Total Time spent with patient: 45 minutes  Subjective:   William Kennedy is a 76 y.o. male patient admitted with "I was pretty sick".  HPI:  Patient interviewed. Also got information from his wife who was present as well. This is a 26 year old man who came into the hospital with altered mental status confusion and pain. Patient had had just a couple of days of decline in his mental status with confusion and memory loss and agitation. Patient was found to have kidney stones with urinary tract infection and sepsis. Also was treated with pain for a cellulitis. Outside the hospital was not normally on pain medication. In the hospital apparently according to his wife the agitation got worse for a day or so. He was pulling on lines and in danger of injuring himself. By today however it is all largely resolved. Patient is still a little bit confused. Could not give me the correct year. He took a long time to answer any questions but both he and especially his wife agreed that he was much better than he was the day before even.  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 Lives at home with his wife. Seemed to have a very good relationship.  Medical history: Acute kidney stones. Sepsis from a wound on his back.  Substance abuse history: Denies any history of alcohol or drug  abuse. Wife says that he is someone who hardly ever even takes over-the-counter medicine and is not used to strong pain medicine.  Past Psychiatric History: No past psychiatric history no history of suicide no history of violence no history of mental health treatment in the past  Risk to Self: Is patient at risk for suicide?: No Risk to Others:   Prior Inpatient Therapy:   Prior Outpatient Therapy:    Past Medical History:  Past Medical History:  Diagnosis Date  . Abscess    shoulder  . Leukemia [facility])     Past Surgical History:  Procedure Laterality Date  . BACK SURGERY    . CYSTOSCOPY WITH URETEROSCOPY AND STENT PLACEMENT Left 11/06/2016   Procedure: CYSTOSCOPY WITH URETEROSCOPY AND STENT PLACEMENT retrograde pyelogram,holmium laser;  Surgeon: Irine Seal, MD;  Location: [facility] ORS;  Service: Urology;  Laterality: Left;  . EXTRACORPOREAL SHOCK WAVE LITHOTRIPSY Left 11/04/2016   Procedure: EXTRACORPOREAL SHOCK WAVE LITHOTRIPSY (ESWL);  Surgeon: Hollice Espy, MD;  Location: [facility] ORS;  Service: Urology;  Laterality: Left;   Family History:  Family History  Problem Relation Age of Onset  . Diabetes Mother   . Cancer Father    Family Psychiatric  History: None Social History:  History  Alcohol Use No     History  Drug Use No    Social History   Social History  . Marital status: Married    Spouse name: N/A  . Number of children: N/A  . Years of education: N/A  Social History Main Topics  . Smoking status: Former Research scientist (life sciences)  . Smokeless tobacco: Former Systems developer  . Alcohol use No  . Drug use: No  . Sexual activity: Not Asked   Other Topics Concern  . None   Social History Narrative  . None   Additional Social History:    Allergies:  No Known Allergies  Labs:  Results for orders placed or performed during the hospital encounter of 11/30/16 (from the past 48 hour(s))  Basic metabolic panel     Status: Abnormal   Collection Time: 12/05/16  4:40 PM  Result Value Ref  Range   Sodium 141 135 - 145 mmol/L   Potassium 2.9 (L) 3.5 - 5.1 mmol/L   Chloride 105 101 - 111 mmol/L   CO2 30 22 - 32 mmol/L   Glucose, Bld 106 (H) 65 - 99 mg/dL   BUN 7 6 - 20 mg/dL   Creatinine, Ser 0.95 0.61 - 1.24 mg/dL   Calcium 8.9 8.9 - 10.3 mg/dL   GFR calc non Af Amer >60 >60 mL/min   GFR calc Af Amer >60 >60 mL/min    Comment: (NOTE) The eGFR has been calculated using the CKD EPI equation. This calculation has not been validated in all clinical situations. eGFR's persistently <60 mL/min signify possible Chronic Kidney Disease.    Anion gap 6 5 - 15  CBC     Status: Abnormal   Collection Time: 12/05/16  4:40 PM  Result Value Ref Range   WBC 17.2 (H) 3.8 - 10.6 K/uL   RBC 4.25 (L) 4.40 - 5.90 MIL/uL   Hemoglobin 13.0 13.0 - 18.0 g/dL   HCT 38.1 (L) 40.0 - 52.0 %   MCV 89.6 80.0 - 100.0 fL   MCH 30.7 26.0 - 34.0 pg   MCHC 34.2 32.0 - 36.0 g/dL   RDW 13.9 11.5 - 14.5 %   Platelets 295 150 - 440 K/uL  Potassium     Status: None   Collection Time: 12/06/16 12:27 PM  Result Value Ref Range   Potassium 3.5 3.5 - 5.1 mmol/L  Magnesium     Status: None   Collection Time: 12/06/16 12:27 PM  Result Value Ref Range   Magnesium 1.7 1.7 - 2.4 mg/dL    Current Facility-Administered Medications  Medication Dose Route Frequency Provider Last Rate Last Dose  . acetaminophen (TYLENOL) tablet 650 mg  650 mg Oral Q6H PRN Hillary Bow, MD   650 mg at 12/05/16 1635   Or  . acetaminophen (TYLENOL) suppository 650 mg  650 mg Rectal Q6H PRN Sudini, Alveta Heimlich, MD      . albuterol (PROVENTIL) (2.5 MG/3ML) 0.083% nebulizer solution 2.5 mg  2.5 mg Nebulization Q2H PRN Sudini, Srikar, MD      . alteplase (CATHFLO ACTIVASE) injection 2 mg  2 mg Intracatheter Once Vaughan Basta, MD      . enoxaparin (LOVENOX) injection 40 mg  40 mg Subcutaneous Q24H Hillary Bow, MD   40 mg at 12/05/16 2131  . feeding supplement (ENSURE ENLIVE) (ENSURE ENLIVE) liquid 237 mL  237 mL Oral TID BM  Vaughan Basta, MD   237 mL at 12/06/16 1009  . finasteride (PROSCAR) tablet 5 mg  5 mg Oral Daily Hollice Espy, MD   5 mg at 12/06/16 4008  . hydrALAZINE (APRESOLINE) injection 10 mg  10 mg Intravenous Q6H PRN Sudini, Alveta Heimlich, MD      . HYDROcodone-acetaminophen (NORCO/VICODIN) 5-325 MG per tablet 1-2 tablet  1-2 tablet Oral Q4H  PRN Hillary Bow, MD   1 tablet at 12/05/16 1106  . loperamide (IMODIUM) capsule 2 mg  2 mg Oral Q6H PRN Vaughan Basta, MD   2 mg at 12/04/16 1926  . LORazepam (ATIVAN) injection 0.5 mg  0.5 mg Intravenous Q6H PRN Hillary Bow, MD   0.5 mg at 12/04/16 0423  . metoprolol tartrate (LOPRESSOR) tablet 12.5 mg  12.5 mg Oral BID Vaughan Basta, MD   12.5 mg at 12/06/16 0840  . morphine 2 MG/ML injection 2 mg  2 mg Intravenous Q4H PRN Hillary Bow, MD   2 mg at 12/04/16 0317  . ondansetron (ZOFRAN) tablet 4 mg  4 mg Oral Q6H PRN Hillary Bow, MD       Or  . ondansetron (ZOFRAN) injection 4 mg  4 mg Intravenous Q6H PRN Sudini, Srikar, MD      . opium-belladonna (B&O SUPPRETTES) 16.2-60 MG suppository 1 suppository  1 suppository Rectal Q8H PRN Vaughan Basta, MD   1 suppository at 12/03/16 2350  . piperacillin-tazobactam (ZOSYN) IVPB 3.375 g  3.375 g Intravenous Q8H Merlyn Lot, MD   Stopped at 12/06/16 2000  . polyethylene glycol (MIRALAX / GLYCOLAX) packet 17 g  17 g Oral Daily PRN Sudini, Alveta Heimlich, MD      . tamsulosin (FLOMAX) capsule 0.4 mg  0.4 mg Oral Daily Hillary Bow, MD   0.4 mg at 12/06/16 0842  . vancomycin (VANCOCIN) 50 mg/mL oral solution 500 mg  500 mg Oral Q6H Sudini, Srikar, MD   500 mg at 12/06/16 1811    Musculoskeletal: Strength & Muscle Tone: decreased Gait & Station: unable to stand Patient leans: N/A  Psychiatric Specialty Exam: Physical Exam  Nursing note and vitals reviewed. Constitutional: He appears well-developed and well-nourished.  HENT:  Head: Normocephalic and atraumatic.  Eyes: Pupils are  equal, round, and reactive to light. Conjunctivae are normal.  Neck: Normal range of motion.  Cardiovascular: Normal heart sounds.   Respiratory: Effort normal.  GI: Soft.  Musculoskeletal: Normal range of motion.  Neurological: He is alert.  Skin: Skin is warm and dry.  Psychiatric: Judgment normal. His affect is blunt. His speech is delayed. He is slowed. Thought content is not paranoid. Cognition and memory are impaired. He expresses no homicidal and no suicidal ideation. He exhibits abnormal recent memory.    Review of Systems  Constitutional: Negative.   HENT: Negative.   Eyes: Negative.   Respiratory: Negative.   Cardiovascular: Negative.   Gastrointestinal: Negative.   Musculoskeletal: Negative.   Skin: Negative.   Neurological: Negative.   Psychiatric/Behavioral: Positive for memory loss. Negative for depression, hallucinations, substance abuse and suicidal ideas. The patient is not nervous/anxious and does not have insomnia.     Blood pressure 130/66, pulse (!) 103, temperature 99 F (37.2 C), temperature source Oral, resp. rate 18, height 6' 2"  (1.88 m), weight 241 lb 3.2 oz (109.4 kg), SpO2 97 %.Body mass index is 30.97 kg/m.  General Appearance: Disheveled  Eye Contact:  Fair  Speech:  Slow  Volume:  Decreased  Mood:  Euthymic  Affect:  Constricted  Thought Process:  Disorganized  Orientation:  Other:  Patient knows he is in the hospital could not however give me the correct year or date  Thought Content:  Tangential  Suicidal Thoughts:  No  Homicidal Thoughts:  No  Memory:  Immediate;   Fair Recent;   Poor Remote;   Fair  Judgement:  Fair  Insight:  Fair  Psychomotor Activity:  Decreased  Concentration:  Concentration: Fair  Recall:  [employer] of Knowledge:  Fair  Language:  Fair  Akathisia:  No  Handed:  Right  AIMS (if indicated):     Assets:  Desire for Improvement Housing Social Support  ADL's:  Impaired  Cognition:  Impaired,  Mild  Sleep:         Treatment Plan Summary: Plan 76 year old man who apparently was quite delirious and agitated over the weekend. Dramatically improved today. Wife says that she thinks that the pain medicine had a lot to do with it. Discussed the multiple causes of delirium including infection, hospitalization, dehydration as well as new medicines especially pain medicine. Patient is clearly on the mend right now. Unlikely that this will turn into any permanent mental status change. Reassured family that patient is likely to return to his baseline. No need to change or add any medicine at this point. Long run I would recommend discontinuing her getting rid of the benzodiazepines. I will follow-up only as needed.  Disposition: Patient does not meet criteria for psychiatric inpatient admission. Supportive therapy provided about ongoing stressors.  Alethia Berthold, MD 12/06/2016 6:57 PM

## 2016-12-08 ENCOUNTER — Encounter: Payer: Self-pay | Admitting: Infectious Diseases

## 2016-12-08 ENCOUNTER — Ambulatory Visit: Payer: BLUE CROSS/BLUE SHIELD | Admitting: Urology

## 2016-12-18 ENCOUNTER — Emergency Department: Payer: BLUE CROSS/BLUE SHIELD

## 2016-12-18 ENCOUNTER — Encounter: Payer: Self-pay | Admitting: *Deleted

## 2016-12-18 ENCOUNTER — Inpatient Hospital Stay
Admission: EM | Admit: 2016-12-18 | Discharge: 2016-12-27 | DRG: 871 | Disposition: A | Discharge status: Home Health | Payer: BLUE CROSS/BLUE SHIELD | Attending: Internal Medicine | Admitting: Internal Medicine

## 2016-12-18 DIAGNOSIS — Q740 Other congenital malformations of upper limb(s), including shoulder girdle: Secondary | ICD-10-CM

## 2016-12-18 DIAGNOSIS — I6612 Occlusion and stenosis of left anterior cerebral artery: Secondary | ICD-10-CM | POA: Diagnosis present

## 2016-12-18 DIAGNOSIS — G9341 Metabolic encephalopathy: Secondary | ICD-10-CM | POA: Diagnosis present

## 2016-12-18 DIAGNOSIS — I639 Cerebral infarction, unspecified: Secondary | ICD-10-CM

## 2016-12-18 DIAGNOSIS — Z66 Do not resuscitate: Secondary | ICD-10-CM | POA: Diagnosis present

## 2016-12-18 DIAGNOSIS — N390 Urinary tract infection, site not specified: Secondary | ICD-10-CM | POA: Diagnosis not present

## 2016-12-18 DIAGNOSIS — R339 Retention of urine, unspecified: Secondary | ICD-10-CM | POA: Diagnosis present

## 2016-12-18 DIAGNOSIS — C9110 Chronic lymphocytic leukemia of B-cell type not having achieved remission: Secondary | ICD-10-CM | POA: Diagnosis present

## 2016-12-18 DIAGNOSIS — Z87442 Personal history of urinary calculi: Secondary | ICD-10-CM | POA: Diagnosis not present

## 2016-12-18 DIAGNOSIS — N320 Bladder-neck obstruction: Secondary | ICD-10-CM | POA: Diagnosis present

## 2016-12-18 DIAGNOSIS — I63512 Cerebral infarction due to unspecified occlusion or stenosis of left middle cerebral artery: Secondary | ICD-10-CM | POA: Diagnosis not present

## 2016-12-18 DIAGNOSIS — I6501 Occlusion and stenosis of right vertebral artery: Secondary | ICD-10-CM | POA: Diagnosis present

## 2016-12-18 DIAGNOSIS — L0390 Cellulitis, unspecified: Secondary | ICD-10-CM

## 2016-12-18 DIAGNOSIS — Z515 Encounter for palliative care: Secondary | ICD-10-CM | POA: Diagnosis not present

## 2016-12-18 DIAGNOSIS — N401 Enlarged prostate with lower urinary tract symptoms: Secondary | ICD-10-CM | POA: Diagnosis present

## 2016-12-18 DIAGNOSIS — Z6827 Body mass index (BMI) 27.0-27.9, adult: Secondary | ICD-10-CM

## 2016-12-18 DIAGNOSIS — I10 Essential (primary) hypertension: Secondary | ICD-10-CM | POA: Diagnosis present

## 2016-12-18 DIAGNOSIS — R410 Disorientation, unspecified: Secondary | ICD-10-CM

## 2016-12-18 DIAGNOSIS — A419 Sepsis, unspecified organism: Principal | ICD-10-CM | POA: Diagnosis present

## 2016-12-18 DIAGNOSIS — N3289 Other specified disorders of bladder: Secondary | ICD-10-CM | POA: Diagnosis present

## 2016-12-18 DIAGNOSIS — L02212 Cutaneous abscess of back [any part, except buttock]: Secondary | ICD-10-CM | POA: Diagnosis present

## 2016-12-18 DIAGNOSIS — Z87891 Personal history of nicotine dependence: Secondary | ICD-10-CM

## 2016-12-18 DIAGNOSIS — D803 Selective deficiency of immunoglobulin G [IgG] subclasses: Secondary | ICD-10-CM | POA: Diagnosis present

## 2016-12-18 DIAGNOSIS — R338 Other retention of urine: Secondary | ICD-10-CM | POA: Diagnosis not present

## 2016-12-18 DIAGNOSIS — Z79899 Other long term (current) drug therapy: Secondary | ICD-10-CM | POA: Diagnosis not present

## 2016-12-18 DIAGNOSIS — G8191 Hemiplegia, unspecified affecting right dominant side: Secondary | ICD-10-CM | POA: Diagnosis not present

## 2016-12-18 DIAGNOSIS — R29810 Facial weakness: Secondary | ICD-10-CM | POA: Diagnosis not present

## 2016-12-18 DIAGNOSIS — I63412 Cerebral infarction due to embolism of left middle cerebral artery: Secondary | ICD-10-CM | POA: Diagnosis not present

## 2016-12-18 DIAGNOSIS — I6522 Occlusion and stenosis of left carotid artery: Secondary | ICD-10-CM | POA: Diagnosis present

## 2016-12-18 DIAGNOSIS — R5383 Other fatigue: Secondary | ICD-10-CM | POA: Diagnosis not present

## 2016-12-18 DIAGNOSIS — R29705 NIHSS score 5: Secondary | ICD-10-CM | POA: Diagnosis not present

## 2016-12-18 DIAGNOSIS — Z466 Encounter for fitting and adjustment of urinary device: Secondary | ICD-10-CM | POA: Diagnosis not present

## 2016-12-18 DIAGNOSIS — I63432 Cerebral infarction due to embolism of left posterior cerebral artery: Secondary | ICD-10-CM | POA: Diagnosis not present

## 2016-12-18 DIAGNOSIS — Z7189 Other specified counseling: Secondary | ICD-10-CM | POA: Diagnosis not present

## 2016-12-18 DIAGNOSIS — E669 Obesity, unspecified: Secondary | ICD-10-CM | POA: Diagnosis present

## 2016-12-18 DIAGNOSIS — I6350 Cerebral infarction due to unspecified occlusion or stenosis of unspecified cerebral artery: Secondary | ICD-10-CM | POA: Diagnosis not present

## 2016-12-18 LAB — CBC WITH DIFFERENTIAL/PLATELET
BASOS PCT: 1 %
Basophils Absolute: 0.3 10*3/uL — ABNORMAL HIGH (ref 0–0.1)
EOS ABS: 1.6 10*3/uL — AB (ref 0–0.7)
Eosinophils Relative: 7 %
HCT: 41.5 % (ref 40.0–52.0)
HEMOGLOBIN: 14.5 g/dL (ref 13.0–18.0)
Lymphocytes Relative: 42 %
Lymphs Abs: 9.6 10*3/uL — ABNORMAL HIGH (ref 1.0–3.6)
MCH: 31.1 pg (ref 26.0–34.0)
MCHC: 34.9 g/dL (ref 32.0–36.0)
MCV: 89.3 fL (ref 80.0–100.0)
MONO ABS: 1.5 10*3/uL — AB (ref 0.2–1.0)
MONOS PCT: 7 %
NEUTROS PCT: 43 %
Neutro Abs: 10 10*3/uL — ABNORMAL HIGH (ref 1.4–6.5)
PLATELETS: 351 10*3/uL (ref 150–440)
RBC: 4.64 MIL/uL (ref 4.40–5.90)
RDW: 13.8 % (ref 11.5–14.5)
WBC: 22.9 10*3/uL — ABNORMAL HIGH (ref 3.8–10.6)

## 2016-12-18 LAB — COMPREHENSIVE METABOLIC PANEL
ALBUMIN: 3.1 g/dL — AB (ref 3.5–5.0)
ALK PHOS: 101 U/L (ref 38–126)
ALT: 42 U/L (ref 17–63)
ANION GAP: 9 (ref 5–15)
AST: 47 U/L — ABNORMAL HIGH (ref 15–41)
BILIRUBIN TOTAL: 0.7 mg/dL (ref 0.3–1.2)
BUN: 14 mg/dL (ref 6–20)
CALCIUM: 10 mg/dL (ref 8.9–10.3)
CO2: 26 mmol/L (ref 22–32)
Chloride: 103 mmol/L (ref 101–111)
Creatinine, Ser: 1.07 mg/dL (ref 0.61–1.24)
GFR calc non Af Amer: >60 mL/min (ref >60)
GLUCOSE: 113 mg/dL — AB (ref 65–99)
Potassium: 3.8 mmol/L (ref 3.5–5.1)
SODIUM: 138 mmol/L (ref 135–145)
TOTAL PROTEIN: 6.6 g/dL (ref 6.5–8.1)

## 2016-12-18 LAB — URINALYSIS, COMPLETE (UACMP) WITH MICROSCOPIC
Bacteria, UA: NONE SEEN
Specific Gravity, Urine: 1.027 (ref 1.005–1.030)

## 2016-12-18 LAB — PROTIME-INR
INR: 1
PROTHROMBIN TIME: 13.2 s (ref 11.4–15.2)

## 2016-12-18 LAB — LACTIC ACID, PLASMA: Lactic Acid, Venous: 1.8 mmol/L (ref 0.5–1.9)

## 2016-12-18 LAB — TROPONIN I

## 2016-12-18 LAB — C DIFFICILE QUICK SCREEN W PCR REFLEX
C DIFFICILE (CDIFF) TOXIN: NEGATIVE
C Diff antigen: NEGATIVE
C Diff interpretation: NOT DETECTED

## 2016-12-18 LAB — PROCALCITONIN: Procalcitonin: 0.17 ng/mL

## 2016-12-18 MED ORDER — ACETAMINOPHEN 650 MG RE SUPP: 650.0000 mg | Freq: Four times a day (QID) | RECTAL | Status: DC | PRN

## 2016-12-18 MED ORDER — HEPARIN SODIUM (PORCINE) 5000 UNIT/ML IJ SOLN
5000.0000 [IU] | Freq: Three times a day (TID) | INTRAMUSCULAR | Status: DC
  Administered 2016-12-18 – 2016-12-22 (×8): 5000 [IU] via SUBCUTANEOUS
  Filled 2016-12-18 (×9): qty 1

## 2016-12-18 MED ORDER — VANCOMYCIN 50 MG/ML ORAL SOLUTION
125.0000 mg | Freq: Four times a day (QID) | ORAL | Status: DC
  Administered 2016-12-18 – 2016-12-19 (×3): 125 mg via ORAL
  Filled 2016-12-18 (×5): qty 2.5

## 2016-12-18 MED ORDER — TAMSULOSIN HCL 0.4 MG PO CAPS
0.4000 mg | ORAL_CAPSULE | Freq: Every day | ORAL | Status: DC
  Administered 2016-12-19 – 2016-12-27 (×8): 0.4 mg via ORAL
  Filled 2016-12-18 (×8): qty 1

## 2016-12-18 MED ORDER — PIPERACILLIN-TAZOBACTAM 3.375 G IVPB 30 MIN
3.3750 g | Freq: Once | INTRAVENOUS | Status: AC
  Administered 2016-12-18: 3.375 g via INTRAVENOUS

## 2016-12-18 MED ORDER — METOPROLOL TARTRATE 25 MG PO TABS
12.5000 mg | ORAL_TABLET | Freq: Two times a day (BID) | ORAL | Status: DC
Start: 2016-12-18 — End: 2016-12-27
  Administered 2016-12-18 – 2016-12-27 (×17): 12.5 mg via ORAL
  Filled 2016-12-18 (×17): qty 1

## 2016-12-18 MED ORDER — ONDANSETRON HCL 4 MG/2ML IJ SOLN: 4.0000 mg | Freq: Four times a day (QID) | INTRAMUSCULAR | Status: DC | PRN

## 2016-12-18 MED ORDER — BELLADONNA ALKALOIDS-OPIUM 16.2-60 MG RE SUPP
1.0000 | Freq: Two times a day (BID) | RECTAL | Status: DC | PRN
  Administered 2016-12-19: 1 via RECTAL
  Filled 2016-12-18 (×2): qty 1

## 2016-12-18 MED ORDER — PIPERACILLIN-TAZOBACTAM 3.375 G IVPB: 3.3750 g | Freq: Three times a day (TID) | INTRAVENOUS | Status: DC

## 2016-12-18 MED ORDER — SENNOSIDES-DOCUSATE SODIUM 8.6-50 MG PO TABS: 1.0000 | ORAL_TABLET | Freq: Every evening | ORAL | Status: DC | PRN

## 2016-12-18 MED ORDER — ACETAMINOPHEN 325 MG PO TABS
650.0000 mg | ORAL_TABLET | Freq: Four times a day (QID) | ORAL | Status: DC | PRN
  Administered 2016-12-18 – 2016-12-23 (×2): 650 mg via ORAL
  Filled 2016-12-18: qty 2

## 2016-12-18 MED ORDER — SODIUM CHLORIDE 0.9 % IV SOLN
INTRAVENOUS | Status: DC
  Administered 2016-12-18 – 2016-12-21 (×5): via INTRAVENOUS
  Administered 2016-12-21: 1000 mL via INTRAVENOUS
  Administered 2016-12-22 – 2016-12-24 (×4): via INTRAVENOUS

## 2016-12-18 MED ORDER — ENSURE ENLIVE PO LIQD
237.0000 mL | Freq: Three times a day (TID) | ORAL | Status: DC
  Administered 2016-12-19 – 2016-12-27 (×20): 237 mL via ORAL

## 2016-12-18 MED ORDER — SODIUM CHLORIDE 0.9 % IV BOLUS (SEPSIS)
1000.0000 mL | Freq: Once | INTRAVENOUS | Status: AC
  Administered 2016-12-18: 1000 mL via INTRAVENOUS

## 2016-12-18 MED ORDER — VANCOMYCIN HCL 10 G IV SOLR
1500.0000 mg | Freq: Two times a day (BID) | INTRAVENOUS | Status: DC
  Administered 2016-12-18 – 2016-12-19 (×2): 1500 mg via INTRAVENOUS
  Filled 2016-12-18 (×4): qty 1500

## 2016-12-18 MED ORDER — LOPERAMIDE HCL 2 MG PO CAPS
2.0000 mg | ORAL_CAPSULE | Freq: Four times a day (QID) | ORAL | Status: DC | PRN
  Administered 2016-12-19 – 2016-12-20 (×3): 2 mg via ORAL
  Filled 2016-12-18 (×3): qty 1

## 2016-12-18 MED ORDER — HYDROCODONE-ACETAMINOPHEN 5-325 MG PO TABS
1.0000 | ORAL_TABLET | ORAL | Status: DC | PRN
  Administered 2016-12-19 – 2016-12-25 (×8): 2 via ORAL
  Administered 2016-12-25: 1 via ORAL
  Filled 2016-12-18: qty 2
  Filled 2016-12-18: qty 1
  Filled 2016-12-18 (×7): qty 2

## 2016-12-18 MED ORDER — FINASTERIDE 5 MG PO TABS
5.0000 mg | ORAL_TABLET | Freq: Every day | ORAL | Status: DC
  Administered 2016-12-19 – 2016-12-27 (×8): 5 mg via ORAL
  Filled 2016-12-18 (×8): qty 1

## 2016-12-18 MED ORDER — VANCOMYCIN 50 MG/ML ORAL SOLUTION
125.0000 mg | Freq: Four times a day (QID) | ORAL | Status: DC
  Filled 2016-12-18 (×2): qty 2.5

## 2016-12-18 MED ORDER — ALBUTEROL SULFATE (2.5 MG/3ML) 0.083% IN NEBU: 2.5000 mg | INHALATION_SOLUTION | RESPIRATORY_TRACT | Status: DC | PRN

## 2016-12-18 MED ORDER — VANCOMYCIN HCL 125 MG PO CAPS: 125.0000 mg | ORAL_CAPSULE | Freq: Four times a day (QID) | ORAL | Status: DC

## 2016-12-18 MED ORDER — ONDANSETRON HCL 4 MG PO TABS: 4.0000 mg | ORAL_TABLET | Freq: Four times a day (QID) | ORAL | Status: DC | PRN

## 2016-12-18 MED ORDER — PIPERACILLIN-TAZOBACTAM 3.375 G IVPB
3.3750 g | Freq: Three times a day (TID) | INTRAVENOUS | Status: DC
  Administered 2016-12-18 – 2016-12-20 (×5): 3.375 g via INTRAVENOUS
  Filled 2016-12-18 (×4): qty 50

## 2016-12-18 MED ORDER — VANCOMYCIN HCL IN DEXTROSE 1-5 GM/200ML-% IV SOLN
1000.0000 mg | Freq: Once | INTRAVENOUS | Status: AC
  Administered 2016-12-18: 1000 mg via INTRAVENOUS
  Filled 2016-12-18: qty 200

## 2016-12-18 NOTE — ED Notes (Signed)
CODE  SEPSIS  CALLED  TO  CAREL[facility] 

## 2016-12-18 NOTE — ED Triage Notes (Signed)
Patient from home. Wife c/o worsening confusion over the past week.  Foley in place from home, PICC Line in place from home

## 2016-12-18 NOTE — ED Provider Notes (Addendum)
[facility] Emergency Department Provider Note  Time seen: 12:45 PM  I have reviewed the triage vital signs and the nursing notes.   HISTORY  Chief Complaint Altered Mental Status    HPI William Kennedy is a 76 y.o. male with a past medical history of recent admission for severe sepsis with encephalopathy. Patient was recently discharged 12/06/16 for severe sepsis with acute encephalopathy. During that admission he had multiple possible causes of sepsis including cellulitis of his back, diverticulitis, as well as past C. difficile infections although his last C. difficile testing was negative. Patient also has a an indwelling Foley catheter. Patient was discharged on IV Rocephin via PICC line. Patient returns to the emergency department from home where he lives with his wife for increased confusion over the past 1-2 days. Here the patient is awake alert but thinks it is 1919. He is able to give some history, however he did not recall where his hospitalization was about (it was here), etc. Does not know what antibiotic he is on.  Past Medical History:  Diagnosis Date  . Abscess    shoulder  . Leukemia [place])     Patient Active Problem List   Diagnosis Date Noted  . Acute delirium 12/06/2016  . Urinary retention   . Microscopic hematuria   . Sepsis ([place]) 11/30/2016  . Left ureteral stone   . Cellulitis and abscess of trunk 10/29/2016  . 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 BACK SURGERY    . CYSTOSCOPY WITH URETEROSCOPY AND STENT PLACEMENT Left 11/06/2016   Procedure: CYSTOSCOPY WITH URETEROSCOPY AND STENT PLACEMENT retrograde pyelogram,holmium laser;  Surgeon: Irine Seal, MD;  Location: [facility] ORS;  Service: Urology;  Laterality: Left;  . EXTRACORPOREAL SHOCK WAVE LITHOTRIPSY Left 11/04/2016   Procedure: EXTRACORPOREAL SHOCK WAVE LITHOTRIPSY (ESWL);  Surgeon: Hollice Espy, MD;  Location:  [facility] ORS;  Service: Urology;  Laterality: Left;    Prior to Admission medications   Medication Sig Start Date End Date Taking? Authorizing Provider  acetaminophen (TYLENOL) 325 MG tablet Take 650 mg by mouth every 4 (four) hours as needed for pain.    [provider]  feeding supplement, ENSURE ENLIVE, (ENSURE ENLIVE) LIQD Take 237 mLs by mouth 3 (three) times daily between meals. 12/03/16   Vaughan Basta, MD  finasteride (PROSCAR) 5 MG tablet Take 1 tablet (5 mg total) by mouth daily. 12/04/16   Vaughan Basta, MD  HYDROcodone-acetaminophen (NORCO/VICODIN) 5-325 MG tablet Take 1 tablet by mouth every 4 (four) hours as needed for moderate pain. 12/03/16   Vaughan Basta, MD  loperamide (IMODIUM) 2 MG capsule Take 1 capsule (2 mg total) by mouth every 6 (six) hours as needed for diarrhea or loose stools. 12/03/16   Vaughan Basta, MD  metoprolol tartrate (LOPRESSOR) 25 MG tablet Take 0.5 tablets (12.5 mg total) by mouth 2 (two) times daily. 12/03/16   Vaughan Basta, MD  ondansetron (ZOFRAN) 4 MG tablet Take 4 mg by mouth every 8 (eight) hours as needed for nausea. 10/28/16   [provider]  opium-belladonna (B&O SUPPRETTES) 16.2-60 MG suppository Place 1 suppository rectally 2 (two) times daily as needed for bladder spasms. 12/03/16   Vaughan Basta, MD  tamsulosin (FLOMAX) 0.4 MG CAPS capsule Take 1 capsule (0.4 mg total) by mouth daily. 12/04/16   Vaughan Basta, MD  traMADol (ULTRAM) 50 MG tablet Take 1 tablet (50 mg total) by mouth every 6 (six) hours  as needed. 11/09/16   Dustin Flock, MD  vancomycin (VANCOCIN) 50 mg/mL oral solution Take 10 mLs (500 mg total) by mouth every 6 (six) hours. 12/03/16 12/21/16  Vaughan Basta, MD    No Known Allergies  Family History  Problem Relation Age of Onset  . Diabetes Mother   . Cancer Father     Social History Social History  Substance Use Topics  . Smoking status:  Former Research scientist (life sciences)  . Smokeless tobacco: Former Systems developer  . Alcohol use No    Review of Systems Constitutional: Negative for fever. ENT: Negative for congestion Cardiovascular: States mild chest pain, intermittently but denies any currently. Respiratory: Negative for shortness of breath. Denies cough. Gastrointestinal: Negative for abdominal pain, vomiting and diarrhea. Genitourinary: Foley catheter placed Musculoskeletal: Negative for back pain. Neurological: Negative for headache All other ROS negative, although possibly somewhat limited due to confusion.  ____________________________________________   PHYSICAL EXAM:  VITAL SIGNS: ED Triage Vitals  Enc Vitals Group     BP 12/18/16 1234 123/79     Pulse Rate 12/18/16 1234 (!) 106     Resp 12/18/16 1234 (!) 23     Temp 12/18/16 1234 98 F (36.7 C)     Temp src --      SpO2 --      Weight 12/18/16 1235 227 lb 11.2 oz (103.3 kg)     Height 12/18/16 1235 6\' 2"  (1.88 m)     Head Circumference --      Peak Flow --      Pain Score --      Pain Loc --      Pain Edu? --      Excl. in [place]? --     Constitutional: Alert. Answers questions mostly appropriately. No acute distress but does appear confused. Believes the year is 1919 was able to tell me that he is in [place], is not able to provide much medical history including recent admission or current antibiotics. Eyes: Normal exam ENT   Head: Normocephalic and atraumatic.   Mouth/Throat: Mucous membranes are moist. Cardiovascular: Regular rhythm, rate around 110 bpm. No obvious murmur. Respiratory: Mild tachypnea. Clear breath sounds bilaterally. Gastrointestinal: Soft and nontender. No distention.  Obese Genitourinary: Excoriated scrotum, full catheter in place with cloudy urine Musculoskeletal: Nontender with normal range of motion in all extremities. Left upper extremity PICC line Neurologic:  Normal speech and language. Able to move all extremities, no obvious gross  deficit. Skin:  Patient has an area of erythematous skin approximately 10 x 10 cm of left upper back with mild drainage Psychiatric: Mood and affect are normal.   ____________________________________________    RADIOLOGY  Chest x-ray negative  EKG reviewed and interpreted by the social sinus tachycardia 103 bpm, slightly widened QRS, normal axis, slightly prolonged QTC, nonspecific ST changes without ST elevation. ____________________________________________   INITIAL IMPRESSION / ASSESSMENT AND PLAN / ED COURSE  Pertinent labs & imaging results that were available during my care of the patient were reviewed by me and considered in my medical decision making (see chart for details).  Patient presents to the emergency department with increased confusion. Recent hospitalization for sepsis with encephalopathy. Given the patient's tachypnea without tachycardia and confusion we will cover with broad-spectrum antibiotics for presumed sepsis. We will check labs, urinalysis we will replace the patient's Foley catheter as he has very cloudy-appearing urine. No tenderness to abdominal palpation. Denies any current chest pain. We will check a troponin.  Labs are largely unchanged slight increase  in leukocytosis. The wife is now here and gives a better history. States the patient was ambulatory prior to his last discharge and is now bedbound due to generalized weakness. States the patient's confusion has been worsening and especially over the last several days. He has not been eating or drinking over the past several days. Today he was very confused attempting to pull out his Foley catheter. Given the patient's continued/worsening confusion, low-grade temperature with elevating white blood cell count we will continue to cover with broad-spectrum antibiotics and admitted to the hospital for further treatment.  Patient's urinalysis is pending at this time however after Foley catheter was exchanged the  patient did have some blood in his urine.  Urinalysis shows too numerous to count red and white blood cells which could be a source of infection.  CRITICAL CARE Performed by: [name]   Total critical care time: 30 minutes  Critical care time was exclusive of separately billable procedures and treating other patients.  Critical care was necessary to treat or prevent imminent or life-threatening deterioration.  Critical care was time spent personally by me on the following activities: development of treatment plan with patient and/or surrogate as well as nursing, discussions with consultants, evaluation of patient's response to treatment, examination of patient, obtaining history from patient or surrogate, ordering and performing treatments and interventions, ordering and review of laboratory studies, ordering and review of radiographic studies, pulse oximetry and re-evaluation of patient's condition.   ____________________________________________   FINAL CLINICAL IMPRESSION(S) / ED DIAGNOSES  Encephalopathy Sepsis Cellulitis, left upper back Urinary tract infection   [name], MD 12/18/16 1525    [name], MD 12/18/16 1537

## 2016-12-18 NOTE — ED Notes (Signed)
Foley changed. MD Notified of bloody drainage

## 2016-12-18 NOTE — H&P (Addendum)
[facility] [place] NAME: William Kennedy    MR#:  [id number]  DATE OF BIRTH:  08-31-1940  DATE OF ADMISSION:  12/18/2016  PRIMARY CARE PHYSICIAN: [name], MD   REQUESTING/REFERRING PHYSICIAN: [name], MD  CHIEF COMPLAINT:   Chief Complaint  Patient presents with  . Altered Mental Status   Confusion for 2 days. HISTORY OF PRESENT ILLNESS:  William Kennedy  is a 76 y.o. male with a known history of SepsiEncephalopathy, back abscess, leukemia and urine retention. The patient was discharged about 2 weeks ago after treatment of sepsis due to back abscess. he was discharged with antibiotics IV via PICC line. He was fine until 2 days ago, he was found confused. In the ED, he was found septic and given antibiotics. Urinalysis showed too numerous W BC and RBC.  PAST MEDICAL HISTORY:   Past Medical History:  Diagnosis Date  . Abscess    shoulder  . Leukemia ([place])     PAST SURGICAL HISTORY:   Past Surgical History:  Procedure Laterality Date  . BACK SURGERY    . CYSTOSCOPY WITH URETEROSCOPY AND STENT PLACEMENT Left 11/06/2016   Procedure: CYSTOSCOPY WITH URETEROSCOPY AND STENT PLACEMENT retrograde pyelogram,holmium laser;  Surgeon: Irine Seal, MD;  Location: [facility] ORS;  Service: Urology;  Laterality: Left;  . EXTRACORPOREAL SHOCK WAVE LITHOTRIPSY Left 11/04/2016   Procedure: EXTRACORPOREAL SHOCK WAVE LITHOTRIPSY (ESWL);  Surgeon: Hollice Espy, MD;  Location: [facility] ORS;  Service: Urology;  Laterality: Left;    SOCIAL HISTORY:   Social History  Substance Use Topics  . Smoking status: Former Research scientist (life sciences)  . Smokeless tobacco: Former Systems developer  . Alcohol use No    FAMILY HISTORY:   Family History  Problem Relation Age of Onset  . Diabetes Mother   . Cancer Father     DRUG ALLERGIES:  No Known Allergies  REVIEW OF SYSTEMS:   Review of Systems  Unable to perform ROS: Mental status change    MEDICATIONS AT HOME:     Prior to Admission medications   Medication Sig Start Date End Date Taking? Authorizing Provider  acetaminophen (TYLENOL) 325 MG tablet Take 650 mg by mouth every 4 (four) hours as needed for pain.   Yes [provider]  cephALEXin (KEFLEX) 500 MG capsule Take 500 mg by mouth 2 (two) times daily. 12/12/16 12/21/16 Yes [provider]  finasteride (PROSCAR) 5 MG tablet Take 1 tablet (5 mg total) by mouth daily. 12/04/16  Yes Vaughan Basta, MD  HYDROcodone-acetaminophen (NORCO/VICODIN) 5-325 MG tablet Take 1 tablet by mouth every 4 (four) hours as needed for moderate pain. 12/03/16  Yes Vaughan Basta, MD  loperamide (IMODIUM) 2 MG capsule Take 1 capsule (2 mg total) by mouth every 6 (six) hours as needed for diarrhea or loose stools. 12/03/16  Yes Vaughan Basta, MD  metoprolol tartrate (LOPRESSOR) 25 MG tablet Take 0.5 tablets (12.5 mg total) by mouth 2 (two) times daily. 12/03/16  Yes Vaughan Basta, MD  ondansetron (ZOFRAN) 4 MG tablet Take 4 mg by mouth every 8 (eight) hours as needed for nausea. 10/28/16  Yes [provider]  opium-belladonna (B&O SUPPRETTES) 16.2-60 MG suppository Place 1 suppository rectally 2 (two) times daily as needed for bladder spasms. 12/03/16  Yes Vaughan Basta, MD  tamsulosin (FLOMAX) 0.4 MG CAPS capsule Take 1 capsule (0.4 mg total) by mouth daily. 12/04/16  Yes Vaughan Basta, MD  vancomycin (VANCOCIN) 125 MG capsule Take 125 mg by  mouth 4 (four) times daily.   Yes [provider]  feeding supplement, ENSURE ENLIVE, (ENSURE ENLIVE) LIQD Take 237 mLs by mouth 3 (three) times daily between meals. 12/03/16   Vaughan Basta, MD  traMADol (ULTRAM) 50 MG tablet Take 1 tablet (50 mg total) by mouth every 6 (six) hours as needed. Patient not taking: Reported on 12/18/2016 11/09/16   Dustin Flock, MD  vancomycin (VANCOCIN) 50 mg/mL oral solution Take 10 mLs (500 mg total) by mouth every 6  (six) hours. Patient not taking: Reported on 12/18/2016 12/03/16 12/21/16  Vaughan Basta, MD      VITAL SIGNS:  Blood pressure 97/65, pulse 100, temperature 99.3 F (37.4 C), resp. rate 19, height 6\' 2"  (1.88 m), weight 227 lb 11.2 oz (103.3 kg), SpO2 99 %.  PHYSICAL EXAMINATION:  Physical Exam  GENERAL:  76 y.o.-year-old patient lying in the bed with no acute distress.  EYES: Pupils equal, round, reactive to light and accommodation. No scleral icterus. Extraocular muscles intact.  HEENT: Head atraumatic, normocephalic. Oropharynx and nasopharynx clear.  NECK:  Supple, no jugular venous distention. No thyroid enlargement, no tenderness. Bruises and tenderness on the left upper side of the back LUNGS: Normal breath sounds bilaterally, no wheezing, rales,rhonchi or crepitation. No use of accessory muscles of respiration.  CARDIOVASCULAR: S1, S2 normal. No murmurs, rubs, or gallops.  ABDOMEN: Soft, nontender, nondistended. Bowel sounds present. No organomegaly or mass.  EXTREMITIES: No pedal edema, cyanosis, or clubbing.  NEUROLOGIC: Cranial nerves II through XII are intact. Muscle strength 4/5 in all extremities. Sensation intact. Gait not checked.  PSYCHIATRIC: The patient is alert and oriented x 2.  SKIN: No obvious rash, lesion, or ulcer.   LABORATORY PANEL:   CBC  Recent Labs Lab 12/18/16 1235  WBC 22.9*  HGB 14.5  HCT 41.5  PLT 351   ------------------------------------------------------------------------------------------------------------------  Chemistries   Recent Labs Lab 12/18/16 1235  NA 138  K 3.8  CL 103  CO2 26  GLUCOSE 113*  BUN 14  CREATININE 1.07  CALCIUM 10.0  AST 47*  ALT 42  ALKPHOS 101  BILITOT 0.7   ------------------------------------------------------------------------------------------------------------------  Cardiac Enzymes No results for input(s): TROPONINI in the last 168  hours. ------------------------------------------------------------------------------------------------------------------  RADIOLOGY:  Dg Chest 2 View  Result Date: 12/18/2016 CLINICAL DATA:  Code sepsis.  Altered mental status. EXAM: CHEST  2 VIEW COMPARISON:  11/30/2016 FINDINGS: Heart and mediastinal contours are within normal limits. No focal opacities or effusions. No acute bony abnormality. IMPRESSION: No active cardiopulmonary disease. Electronically Signed   By: Rolm Baptise M.D.   On: 12/18/2016 15:01      IMPRESSION AND PLAN:   Sepsis due to UTI or back wound infection. The patient will be admitted to medical floor. I will start vancomycin and Zosyn pharmacy to dose. Follow-up CBC, urine culture and blood cultures. Surgical consult and ID consult. Urinary retention. Foley catheter was changed the ED. Continue Proscar and Flomax. History of C. difficile colitis. Continue by mouth vancomycin.  All the records are reviewed and case discussed with ED provider. Management plans discussed with the patient's wife and they are in agreement.  CODE STATUS: Full code  TOTAL TIME TAKING CARE OF THIS PATIENT: 55 minutes.    Demetrios Loll M.D on 12/18/2016 at 4:21 PM  Between 7am to 6pm - Pager - [phone]  After 6pm go to [email or web address] - [employer] [facility]  Office  [phone]  CC: Primary care physician; [name],  MD   Note: This dictation was prepared with Dragon dictation along with smaller phrase technology. Any transcriptional errors that result from this process are unintentional.

## 2016-12-18 NOTE — Consult Note (Signed)
Surgical Consultation  12/18/2016  William Kennedy is an 76 y.o. male.   CC: Sepsis  HPI: This a patient readmitted from her most recent admission approximately 2 weeks ago with severe sepsis. He has been encephalopathic in the past. 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 He also has a history of C. difficile colitis. Is been on Rocephin via PICC line. History is obtained from the chart and from his wife as he is confused and crying out in pain. I cannot associate where his pain is coming from as the nurses are turning him back and forth. Review of systems is not possible due to his mental status condition.   Past Medical History:  Diagnosis Date  . Abscess    shoulder  . Leukemia [facility])     Past Surgical History:  Procedure Laterality Date  . BACK SURGERY    . CYSTOSCOPY WITH URETEROSCOPY AND STENT PLACEMENT Left 11/06/2016   Procedure: CYSTOSCOPY WITH URETEROSCOPY AND STENT PLACEMENT retrograde pyelogram,holmium laser;  Surgeon: Irine Seal, MD;  Location: [facility] ORS;  Service: Urology;  Laterality: Left;  . EXTRACORPOREAL SHOCK WAVE LITHOTRIPSY Left 11/04/2016   Procedure: EXTRACORPOREAL SHOCK WAVE LITHOTRIPSY (ESWL);  Surgeon: Hollice Espy, MD;  Location: [facility] ORS;  Service: Urology;  Laterality: Left;    Family History  Problem Relation Age of Onset  . Diabetes Mother   . Cancer Father     Social History:  reports that he has quit smoking. He has quit using smokeless tobacco. He reports that he does not drink alcohol or use drugs.  Allergies: No Known Allergies  Medications reviewed.   Review of Systems:   Review of Systems  Unable to perform ROS: Medical condition     Physical Exam:  BP 109/89   Pulse (!) 105   Temp 99.2 F (37.3 C)   Resp 18   Ht 6' 3"  (1.905 m)   Wt 223 lb 6.4 oz (101.3 kg)   SpO2 96%   BMI 27.92 kg/m   Physical Exam  Constitutional: He  appears distressed.  HENT:  Head: Normocephalic and atraumatic.  Eyes: Right eye exhibits no discharge. Left eye exhibits no discharge. No scleral icterus.  Skin: Skin is warm. Rash noted. He is not diaphoretic. There is erythema.  Inspection of the skin of the back demonstrates an old healed scar from surgical excision. There are multiple erythematous pustules over his back. There is a larger cavity appearing area with expressible purulence and erythema. This appears to be a surgical incision site.  Vitals reviewed.     Results for orders placed or performed during the hospital encounter of 12/18/16 (from the past 48 hour(s))  Comprehensive metabolic panel     Status: Abnormal   Collection Time: 12/18/16 12:35 PM  Result Value Ref Range   Sodium 138 135 - 145 mmol/L   Potassium 3.8 3.5 - 5.1 mmol/L   Chloride 103 101 - 111 mmol/L   CO2 26 22 - 32 mmol/L   Glucose, Bld 113 (H) 65 - 99 mg/dL   BUN 14 6 - 20 mg/dL   Creatinine, Ser 1.07 0.61 - 1.24 mg/dL   Calcium 10.0 8.9 - 10.3 mg/dL   Total Protein 6.6 6.5 - 8.1 g/dL   Albumin 3.1 (L) 3.5 - 5.0 g/dL   AST 47 (H) 15 - 41 U/L   ALT 42 17 - 63 U/L   Alkaline Phosphatase  101 38 - 126 U/L   Total Bilirubin 0.7 0.3 - 1.2 mg/dL   GFR calc non Af Amer >60 >60 mL/min   GFR calc Af Amer >60 >60 mL/min    Comment: (NOTE) The eGFR has been calculated using the CKD EPI equation. This calculation has not been validated in all clinical situations. eGFR's persistently <60 mL/min signify possible Chronic Kidney Disease.    Anion gap 9 5 - 15  Lactic acid, plasma     Status: None   Collection Time: 12/18/16 12:35 PM  Result Value Ref Range   Lactic Acid, Venous 1.8 0.5 - 1.9 mmol/L  CBC with Differential     Status: Abnormal   Collection Time: 12/18/16 12:35 PM  Result Value Ref Range   WBC 22.9 (H) 3.8 - 10.6 K/uL   RBC 4.64 4.40 - 5.90 MIL/uL   Hemoglobin 14.5 13.0 - 18.0 g/dL   HCT 41.5 40.0 - 52.0 %   MCV 89.3 80.0 - 100.0 fL    MCH 31.1 26.0 - 34.0 pg   MCHC 34.9 32.0 - 36.0 g/dL   RDW 13.8 11.5 - 14.5 %   Platelets 351 150 - 440 K/uL   Neutrophils Relative % 43 %   Neutro Abs 10.0 (H) 1.4 - 6.5 K/uL   Lymphocytes Relative 42 %   Lymphs Abs 9.6 (H) 1.0 - 3.6 K/uL   Monocytes Relative 7 %   Monocytes Absolute 1.5 (H) 0.2 - 1.0 K/uL   Eosinophils Relative 7 %   Eosinophils Absolute 1.6 (H) 0 - 0.7 K/uL   Basophils Relative 1 %   Basophils Absolute 0.3 (H) 0 - 0.1 K/uL  Protime-INR     Status: None   Collection Time: 12/18/16 12:35 PM  Result Value Ref Range   Prothrombin Time 13.2 11.4 - 15.2 seconds   INR 1.00   Culture, blood (Routine x 2)     Status: None (Preliminary result)   Collection Time: 12/18/16 12:35 PM  Result Value Ref Range   Specimen Description BLOOD RIGHT ANTECUBITAL    Special Requests      BOTTLES DRAWN AEROBIC AND ANAEROBIC Blood Culture adequate volume   Culture NO GROWTH < 12 HOURS    Report Status PENDING   Culture, blood (Routine x 2)     Status: None (Preliminary result)   Collection Time: 12/18/16 12:35 PM  Result Value Ref Range   Specimen Description BLOOD BLOOD LEFT WRIST    Special Requests      BOTTLES DRAWN AEROBIC AND ANAEROBIC Blood Culture adequate volume   Culture NO GROWTH < 12 HOURS    Report Status PENDING   Urinalysis, Complete w Microscopic     Status: Abnormal   Collection Time: 12/18/16 12:35 PM  Result Value Ref Range   Color, Urine RED (A) YELLOW   APPearance HAZY (A) CLEAR   Specific Gravity, Urine 1.027 1.005 - 1.030   pH  5.0 - 8.0    TEST NOT REPORTED DUE TO COLOR INTERFERENCE OF URINE PIGMENT   Glucose, UA (A) NEGATIVE mg/dL    TEST NOT REPORTED DUE TO COLOR INTERFERENCE OF URINE PIGMENT   Hgb urine dipstick (A) NEGATIVE    TEST NOT REPORTED DUE TO COLOR INTERFERENCE OF URINE PIGMENT   Bilirubin Urine (A) NEGATIVE    TEST NOT REPORTED DUE TO COLOR INTERFERENCE OF URINE PIGMENT   Ketones, ur (A) NEGATIVE mg/dL    TEST NOT REPORTED DUE TO COLOR  INTERFERENCE OF URINE PIGMENT  Protein, ur (A) NEGATIVE mg/dL    TEST NOT REPORTED DUE TO COLOR INTERFERENCE OF URINE PIGMENT   Nitrite (A) NEGATIVE    TEST NOT REPORTED DUE TO COLOR INTERFERENCE OF URINE PIGMENT   Leukocytes, UA (A) NEGATIVE    TEST NOT REPORTED DUE TO COLOR INTERFERENCE OF URINE PIGMENT   RBC / HPF TOO NUMEROUS TO COUNT 0 - 5 RBC/hpf   WBC, UA TOO NUMEROUS TO COUNT 0 - 5 WBC/hpf   Bacteria, UA NONE SEEN NONE SEEN   Squamous Epithelial / LPF 0-5 (A) NONE SEEN   Dg Chest 2 View  Result Date: 12/18/2016 CLINICAL DATA:  Code sepsis.  Altered mental status. EXAM: CHEST  2 VIEW COMPARISON:  11/30/2016 FINDINGS: Heart and mediastinal contours are within normal limits. No focal opacities or effusions. No acute bony abnormality. IMPRESSION: No active cardiopulmonary disease. Electronically Signed   By: Rolm Baptise M.D.   On: 12/18/2016 15:01    Assessment/Plan:  Sepsis and confusion. Worsening confusion on IV Rocephin. History is obtained from the chart and from his wife as he is not reliable. The question for general surgery was whether or not his sepsis was coming from his chronic back abscess or from the urinary tract infection which is quite obvious.  While there is expressible purulence there is minimal tenderness and erythema of the color suggesting that this is chronic in nature. While this may require incision and drainage of a more formal nature I would hold off on doing that until adequate treatment of his urinary tract infection has occurred as that is the likely site and origin of his sepsis. This is discussed with his wife and I will reexamine him in the morning.  Florene Glen, MD, FACS

## 2016-12-18 NOTE — ED Notes (Signed)
MD aware of bloody urine

## 2016-12-18 NOTE — Progress Notes (Signed)
ANTIBIOTIC CONSULT NOTE - INITIAL  Pharmacy Consult for Vancomycin  Indication: sepsis  No Known Allergies  Patient Measurements: Height: 6\' 3"  (190.5 cm) Weight: 223 lb 6.4 oz (101.3 kg) IBW/kg (Calculated) : 84.5 Adjusted Body Weight: 91.2  Vital Signs: Temp: 98.7 F (37.1 C) (07/28 1818) Temp Source: Oral (07/28 1818) BP: 120/74 (07/28 1818) Pulse Rate: 106 (07/28 1818) Intake/Output from previous day: No intake/output data recorded. Intake/Output from this shift: Total I/O In: 1438.8 [P.O.:120; I.V.:18.8; IV Piggyback:1300] Out: 250 [Urine:250]  Labs:  Recent Labs  12/18/16 1235  WBC 22.9*  HGB 14.5  PLT 351  CREATININE 1.07   Estimated Creatinine Clearance: 71.3 mL/min (by C-G formula based on SCr of 1.07 mg/dL). No results for input(s): VANCOTROUGH, VANCOPEAK, VANCORANDOM, GENTTROUGH, GENTPEAK, GENTRANDOM, TOBRATROUGH, TOBRAPEAK, TOBRARND, AMIKACINPEAK, AMIKACINTROU, AMIKACIN in the last 72 hours.   Microbiology: Recent Results (from the past 720 hour(s))  Blood Culture (routine x 2)     Status: None   Collection Time: 11/30/16  2:43 PM  Result Value Ref Range Status   Specimen Description BLOOD BLOOD LEFT HAND  Final   Special Requests   Final    BOTTLES DRAWN AEROBIC AND ANAEROBIC Blood Culture results may not be optimal due to an inadequate volume of blood received in culture bottles   Culture NO GROWTH 5 DAYS  Final   Report Status 12/05/2016 FINAL  Final  Blood Culture (routine x 2)     Status: None   Collection Time: 11/30/16  3:17 PM  Result Value Ref Range Status   Specimen Description BLOOD BLOOD LEFT FOREARM  Final   Special Requests   Final    BOTTLES DRAWN AEROBIC AND ANAEROBIC Blood Culture adequate volume   Culture NO GROWTH 5 DAYS  Final   Report Status 12/05/2016 FINAL  Final  Urine culture     Status: None   Collection Time: 11/30/16  3:17 PM  Result Value Ref Range Status   Specimen Description URINE, RANDOM  Final   Special  Requests NONE  Final   Culture   Final    NO GROWTH Performed at [facility], [place] [address], [place], [place] [address]    Report Status 12/02/2016 FINAL  Final  C difficile quick scan w PCR reflex     Status: None   Collection Time: 12/01/16  9:15 AM  Result Value Ref Range Status   C Diff antigen NEGATIVE NEGATIVE Final   C Diff toxin NEGATIVE NEGATIVE Final   C Diff interpretation No C. difficile detected.  Final  Culture, blood (Routine x 2)     Status: None (Preliminary result)   Collection Time: 12/18/16 12:35 PM  Result Value Ref Range Status   Specimen Description BLOOD RIGHT ANTECUBITAL  Final   Special Requests   Final    BOTTLES DRAWN AEROBIC AND ANAEROBIC Blood Culture adequate volume   Culture NO GROWTH < 12 HOURS  Final   Report Status PENDING  Incomplete  Culture, blood (Routine x 2)     Status: None (Preliminary result)   Collection Time: 12/18/16 12:35 PM  Result Value Ref Range Status   Specimen Description BLOOD BLOOD LEFT WRIST  Final   Special Requests   Final    BOTTLES DRAWN AEROBIC AND ANAEROBIC Blood Culture adequate volume   Culture NO GROWTH < 12 HOURS  Final   Report Status PENDING  Incomplete    Medical History: Past Medical History:  Diagnosis Date  . Abscess  shoulder  . Leukemia ([place])     Medications:  Prescriptions Prior to Admission  Medication Sig Dispense Refill Last Dose  . acetaminophen (TYLENOL) 325 MG tablet Take 650 mg by mouth every 4 (four) hours as needed for pain.   prn at prn  . cephALEXin (KEFLEX) 500 MG capsule Take 500 mg by mouth 2 (two) times daily.   12/18/2016 at am  . finasteride (PROSCAR) 5 MG tablet Take 1 tablet (5 mg total) by mouth daily. 30 tablet 0 12/18/2016 at am  . HYDROcodone-acetaminophen (NORCO/VICODIN) 5-325 MG tablet Take 1 tablet by mouth every 4 (four) hours as needed for moderate pain. 30 tablet 0 12/18/2016 at 0200  . loperamide (IMODIUM) 2 MG capsule Take 1 capsule (2 mg total) by mouth every  6 (six) hours as needed for diarrhea or loose stools. 30 capsule 0 prn at prn  . metoprolol tartrate (LOPRESSOR) 25 MG tablet Take 0.5 tablets (12.5 mg total) by mouth 2 (two) times daily. 30 tablet 0 12/17/2016 at pm  . ondansetron (ZOFRAN) 4 MG tablet Take 4 mg by mouth every 8 (eight) hours as needed for nausea.   prn at PRN  . opium-belladonna (B&O SUPPRETTES) 16.2-60 MG suppository Place 1 suppository rectally 2 (two) times daily as needed for bladder spasms. 20 suppository 0 prn at prn  . tamsulosin (FLOMAX) 0.4 MG CAPS capsule Take 1 capsule (0.4 mg total) by mouth daily. 30 capsule 0 12/18/2016 at am  . vancomycin (VANCOCIN) 125 MG capsule Take 125 mg by mouth 4 (four) times daily.   12/18/2016 at am  . feeding supplement, ENSURE ENLIVE, (ENSURE ENLIVE) LIQD Take 237 mLs by mouth 3 (three) times daily between meals. 237 mL 12   . traMADol (ULTRAM) 50 MG tablet Take 1 tablet (50 mg total) by mouth every 6 (six) hours as needed. (Patient not taking: Reported on 12/18/2016) 30 tablet 0 Completed Course at Unknown time  . vancomycin (VANCOCIN) 50 mg/mL oral solution Take 10 mLs (500 mg total) by mouth every 6 (six) hours. (Patient not taking: Reported on 12/18/2016) 720 mL 0 Not Taking at Unknown time   Assessment: CrCl = 77 ml/min Ke = 0.068 hr-1 T1/2 = 10.2 hrs Vd = 70.9 L   Goal of Therapy:  Vancomycin trough level 15-20 mcg/ml  Plan:  Expected duration 7 days with resolution of temperature and/or normalization of WBC   Vancomycin 1 gm IV X 1 given on 7/28 @ 13:26. Vancomycin 1500 mg IV Q12H ordered to start on 7/28 @ 20:00, ~ 6 hrs after 1st dose (stacked dosing).  This pt will reach [facility] by 7/30 @ 13:00. Will draw 1st trough on 7/30 @ 19:30, which will be at C[facility].   Danyah Guastella D 12/18/2016,6:51 PM

## 2016-12-19 DIAGNOSIS — A419 Sepsis, unspecified organism: Principal | ICD-10-CM

## 2016-12-19 LAB — CBC
HCT: 38.3 % — ABNORMAL LOW (ref 40.0–52.0)
Hemoglobin: 13 g/dL (ref 13.0–18.0)
MCH: 30.5 pg (ref 26.0–34.0)
MCHC: 33.9 g/dL (ref 32.0–36.0)
MCV: 90 fL (ref 80.0–100.0)
PLATELETS: 313 10*3/uL (ref 150–440)
RBC: 4.25 MIL/uL — ABNORMAL LOW (ref 4.40–5.90)
RDW: 13.9 % (ref 11.5–14.5)
WBC: 19.6 10*3/uL — ABNORMAL HIGH (ref 3.8–10.6)

## 2016-12-19 LAB — BASIC METABOLIC PANEL
Anion gap: 8 (ref 5–15)
BUN: 11 mg/dL (ref 6–20)
CALCIUM: 9.3 mg/dL (ref 8.9–10.3)
CO2: 27 mmol/L (ref 22–32)
CREATININE: 1.01 mg/dL (ref 0.61–1.24)
Chloride: 106 mmol/L (ref 101–111)
GFR calc Af Amer: >60 mL/min (ref >60)
GLUCOSE: 96 mg/dL (ref 65–99)
Potassium: 3.9 mmol/L (ref 3.5–5.1)
Sodium: 141 mmol/L (ref 135–145)

## 2016-12-19 LAB — URINE CULTURE: Culture: NO GROWTH

## 2016-12-19 LAB — MRSA PCR SCREENING: MRSA BY PCR: NEGATIVE

## 2016-12-19 NOTE — Progress Notes (Signed)
CC: Sepsis Subjective: This patient was admitted with sepsis and confusion. He has a long-standing and chronic back abscess that has been drained previously. It continues to drain. The source of his sepsis is likely a UTI with 2 numerous to count white blood cells and red blood cells. Wife is present and reports that he is less confused today. He awakes appropriately. The remainder the review of systems cannot be obtained at this time. This due to his ongoing mental acuity  Objective: Vital signs in last 24 hours: Temp:  [97.9 F (36.6 C)-99.3 F (37.4 C)] 98.6 F (37 C) (07/29 0640) Pulse Rate:  [86-109] 104 (07/29 0640) Resp:  [12-26] 20 (07/29 0640) BP: (97-139)/(60-115) 124/60 (07/29 0640) SpO2:  [71 %-100 %] 100 % (07/29 0640) Weight:  [223 lb 6.4 oz (101.3 kg)-227 lb 11.2 oz (103.3 kg)] 223 lb 6.4 oz (101.3 kg) (07/28 1813) Last BM Date: 12/18/16  Intake/Output from previous day: 07/28 0701 - 07/29 0700 In: 2154.8 [P.O.:361; I.V.:493.8; IV Piggyback:1300] Out: 930 [Urine:930] Intake/Output this shift: Total I/O In: -  Out: 200 [Urine:200]  Physical exam:  Vital signs stable afebrile at this time an overnight. Abdomen is soft and nontender no icterus no jaundice Skin of the back demonstrates multiple scars multiple folliculitis areas. There is chronic-appearing inflammatory changes to the skin but no expressible purulence today there is some serous fluid on the dressing.  Lab Results: CBC   Recent Labs  12/18/16 1235 12/19/16 0426  WBC 22.9* 19.6*  HGB 14.5 13.0  HCT 41.5 38.3*  PLT 351 313   BMET  Recent Labs  12/18/16 1235 12/19/16 0426  NA 138 141  K 3.8 3.9  CL 103 106  CO2 26 27  GLUCOSE 113* 96  BUN 14 11  CREATININE 1.07 1.01  CALCIUM 10.0 9.3   PT/INR  Recent Labs  12/18/16 1235  LABPROT 13.2  INR 1.00   ABG No results for input(s): PHART, HCO3 in the last 72 hours.  Invalid input(s): PCO2, PO2  Studies/Results: Dg Chest 2  View  Result Date: 12/18/2016 CLINICAL DATA:  Code sepsis.  Altered mental status. EXAM: CHEST  2 VIEW COMPARISON:  11/30/2016 FINDINGS: Heart and mediastinal contours are within normal limits. No focal opacities or effusions. No acute bony abnormality. IMPRESSION: No active cardiopulmonary disease. Electronically Signed   By: Rolm Baptise M.D.   On: 12/18/2016 15:01    Anti-infectives: Anti-infectives    Start     Dose/Rate Route Frequency Ordered Stop   12/18/16 2200  piperacillin-tazobactam (ZOSYN) IVPB 3.375 g     3.375 g 12.5 mL/hr over 240 Minutes Intravenous Every 8 hours 12/18/16 1504     12/18/16 2200  vancomycin (VANCOCIN) 50 mg/mL oral solution 125 mg     125 mg Oral 4 times daily 12/18/16 1923     12/18/16 2000  vancomycin (VANCOCIN) 1,500 mg in sodium chloride 0.9 % 500 mL IVPB     1,500 mg 250 mL/hr over 120 Minutes Intravenous Every 12 hours 12/18/16 1849     12/18/16 1900  piperacillin-tazobactam (ZOSYN) IVPB 3.375 g  Status:  Discontinued     3.375 g 12.5 mL/hr over 240 Minutes Intravenous Every 8 hours 12/18/16 1850 12/18/16 1851   12/18/16 1800  vancomycin (VANCOCIN) 125 MG capsule 125 mg  Status:  Discontinued     125 mg Oral 4 times daily 12/18/16 1739 12/18/16 1750   12/18/16 1800  vancomycin (VANCOCIN) 50 mg/mL oral solution 125 mg  Status:  Discontinued     125 mg Oral 4 times daily 12/18/16 1751 12/18/16 1923   12/18/16 1245  piperacillin-tazobactam (ZOSYN) IVPB 3.375 g     3.375 g 100 mL/hr over 30 Minutes Intravenous  Once 12/18/16 1242 12/18/16 1513   12/18/16 1245  vancomycin (VANCOCIN) IVPB 1000 mg/200 mL premix     1,000 mg 200 mL/hr over 60 Minutes Intravenous  Once 12/18/16 1242 12/18/16 1513      Assessment/Plan:  White blood cell count remains elevated.  Patient admitted with sepsis likely secondary to a severe UTI on IV antibiotics. This chronic back infection involving a big portion of the skin as well as a probable drained abscess may require  formal incision and drainage and possible drain placement. However with the patient improving and his condition seems to be less critical at this point I would recommend continuing his IV antibiotics until more lucid and then in the next day or 2 possibly go to the operating room for drainage of this chronic abscess or seroma cavity. I do not believe that this is the source of his infection and it is likely a urinary tract infection otherwise. We will continue to observe but would plan drainage prior to discharge at this point. This was discussed with his wife and she understood.  Florene Glen, MD, FACS  12/19/2016

## 2016-12-19 NOTE — Clinical Social Work Note (Signed)
CSW received consult for SNF placement. CSW will follow pending PT recommendation.  Gaston Dase Martha Makenlee Mckeag, MSW, LCSWA [phone] 

## 2016-12-19 NOTE — Progress Notes (Signed)
[place] [facility] [place] NAME: William Kennedy    MR#:  [id number]  DATE OF BIRTH:  [birth year]  SUBJECTIVE:   Patient here due to altered mental status and suspected sepsis. 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 Still having significant pain near his Foley catheter area and having significant bladder spasms. Patient's wife is at bedside.  REVIEW OF SYSTEMS:    Review of Systems  Constitutional: Negative for chills and fever.  HENT: Negative for congestion and tinnitus.   Eyes: Negative for blurred vision and double vision.  Respiratory: Negative for cough, shortness of breath and wheezing.   Cardiovascular: Negative for chest pain, orthopnea and PND.  Gastrointestinal: Positive for abdominal pain. Negative for diarrhea, nausea and vomiting.  Genitourinary: Negative for dysuria and hematuria.       Bladder spams  Neurological: Negative for dizziness, sensory change and focal weakness.  All other systems reviewed and are negative.   Nutrition: Full Liquid Tolerating Diet: Yes Tolerating PT: Await Eval.   DRUG ALLERGIES:  No Known Allergies  VITALS:  Blood pressure 124/60, pulse (!) 104, temperature 98.6 F (37 C), resp. rate 20, height 6\' 3"  (1.905 m), weight 101.3 kg (223 lb 6.4 oz), SpO2 100 %.  PHYSICAL EXAMINATION:   Physical Exam  GENERAL:  76 y.o.-year-old patient lying in bed in some pain.   EYES: Pupils equal, round, reactive to light and accommodation. No scleral icterus. Extraocular muscles intact.  HEENT: Head atraumatic, normocephalic. Oropharynx and nasopharynx clear.  NECK:  Supple, no jugular venous distention. No thyroid enlargement, no tenderness.  LUNGS: Normal breath sounds bilaterally, no wheezing, rales, rhonchi. No use of accessory muscles of respiration.  CARDIOVASCULAR: S1, S2 normal. No murmurs, rubs, or gallops.  ABDOMEN: Soft, nontender, nondistended. Bowel sounds present.  No organomegaly or mass.  EXTREMITIES: No cyanosis, clubbing or edema b/l.    NEUROLOGIC: Cranial nerves II through XII are intact. No focal Motor or sensory deficits b/l. Globally weak   PSYCHIATRIC: The patient is alert and oriented x 2.  SKIN: No obvious rash, lesion, or ulcer. Left shoulder erythematous area with some minimal drainage and a small open area.  Foley catheter in place with yellow urine draining. Some pus noted near the catheter insertion site near The penis.  LABORATORY PANEL:   CBC  Recent Labs Lab 12/19/16 0426  WBC 19.6*  HGB 13.0  HCT 38.3*  PLT 313   ------------------------------------------------------------------------------------------------------------------  Chemistries   Recent Labs Lab 12/18/16 1235 12/19/16 0426  NA 138 141  K 3.8 3.9  CL 103 106  CO2 26 27  GLUCOSE 113* 96  BUN 14 11  CREATININE 1.07 1.01  CALCIUM 10.0 9.3  AST 47*  --   ALT 42  --   ALKPHOS 101  --   BILITOT 0.7  --    ------------------------------------------------------------------------------------------------------------------  Cardiac Enzymes  Recent Labs Lab 12/18/16 1235  TROPONINI <0.03   ------------------------------------------------------------------------------------------------------------------  RADIOLOGY:  Dg Chest 2 View  Result Date: 12/18/2016 CLINICAL DATA:  Code sepsis.  Altered mental status. EXAM: CHEST  2 VIEW COMPARISON:  11/30/2016 FINDINGS: Heart and mediastinal contours are within normal limits. No focal opacities or effusions. No acute bony abnormality. IMPRESSION: No active cardiopulmonary disease. Electronically Signed   By: Rolm Baptise M.D.   On: 12/18/2016 15:01     ASSESSMENT AND PLAN:   76 year old male with past medical history of CLL, hypertension, history of nephrolithiasis/BPH with chronic indwelling  Foley, recent admission for sepsis secondary to left shoulder/upper back abscess, who presented to the hospital due  to altered mental status.  1. Altered mental status-metabolic encephalopathy secondary to underlying suspected sepsis from UTI. -Continue IV antibiotics and mental status is improving.  2. Suspected sepsis-patient presented to the hospital due to leukocytosis, positive urinalysis, tachycardia. -Suspected source is thought to be urinary tract infection. Continue IV vancomycin, Zosyn for now. Await MRSA PCR will now return on antibiotics. -Patient does have a left shoulder/upper back abscess although seen by surgery and they do not think that this is the patient's source of sepsis presently. Patient does need incision and drainage of this abscess and surgery plans to do it in the next 1-2 days once his mental status improves.  3. Urinary tract infection-continue IV Zosyn. Follow urine cultures.  4. Left shoulder/upper back abscess-seen by general surgery and plan for incision and drainage in the next 1-2 days. -Continue IV Zosyn, Vancomycin.  5. History of nephrolithiasis/BPH with chronic indwelling Foley-patient having significant pain near his catheter insertion site. He is having significant bladder spasms. -Continue B&O suppositories, supportive care. I will get a urology consult. -Continue Flomax, finasteride.  6. History of CLL-white cell count is stable.  7. Essential hypertension-continue metoprolol     All the records are reviewed and case discussed with Care Management/Social Worker. Management plans discussed with the patient, family and they are in agreement.  CODE STATUS: Full  DVT Prophylaxis: Heparin subcutaneous  TOTAL TIME TAKING CARE OF THIS PATIENT: 30 minutes.   POSSIBLE D/C IN 2-3 DAYS, DEPENDING ON CLINICAL CONDITION.   Henreitta Leber M.D on 12/19/2016 at 12:32 PM  Between 7am to 6pm - Pager - [phone]  After 6pm go to [email or web address] - Technical brewer [facility]  Office  [phone]  CC: Primary care physician;  [name], MD

## 2016-12-20 LAB — CBC
HEMATOCRIT: 38.6 % — AB (ref 40.0–52.0)
Hemoglobin: 13.2 g/dL (ref 13.0–18.0)
MCH: 30.9 pg (ref 26.0–34.0)
MCHC: 34.2 g/dL (ref 32.0–36.0)
MCV: 90.3 fL (ref 80.0–100.0)
Platelets: 299 10*3/uL (ref 150–440)
RBC: 4.28 MIL/uL — ABNORMAL LOW (ref 4.40–5.90)
RDW: 13.8 % (ref 11.5–14.5)
WBC: 17.6 10*3/uL — AB (ref 3.8–10.6)

## 2016-12-20 MED ORDER — LIDOCAINE-EPINEPHRINE (PF) 1 %-1:200000 IJ SOLN
30.0000 mL | Freq: Once | INTRAMUSCULAR | Status: DC
  Filled 2016-12-20: qty 30

## 2016-12-20 MED ORDER — ZINC OXIDE 40 % EX OINT
TOPICAL_OINTMENT | Freq: Three times a day (TID) | CUTANEOUS | Status: DC
  Administered 2016-12-20 – 2016-12-27 (×18): via TOPICAL
  Filled 2016-12-20 (×2): qty 114

## 2016-12-20 MED ORDER — VANCOMYCIN 50 MG/ML ORAL SOLUTION
125.0000 mg | Freq: Four times a day (QID) | ORAL | Status: DC
  Administered 2016-12-20: 125 mg via ORAL
  Filled 2016-12-20 (×2): qty 2.5

## 2016-12-20 MED ORDER — IMMUNE GLOBULIN (HUMAN) 20 GM/200ML IV SOLN
400.0000 mg/kg | Freq: Once | INTRAVENOUS | Status: AC
  Administered 2016-12-20 (×2): 20 g via INTRAVENOUS
  Filled 2016-12-20: qty 200

## 2016-12-20 MED ORDER — CEFAZOLIN SODIUM-DEXTROSE 2-4 GM/100ML-% IV SOLN
2.0000 g | Freq: Three times a day (TID) | INTRAVENOUS | Status: DC
  Administered 2016-12-20 – 2016-12-27 (×21): 2 g via INTRAVENOUS
  Filled 2016-12-20 (×29): qty 100

## 2016-12-20 NOTE — Evaluation (Signed)
Physical Therapy Evaluation Patient Details Name: William Kennedy MRN: [id number] DOB: 1940/06/24 Today's Date: 12/20/2016   History of Present Illness   presented to ER secondary to increasing confusion; admitted with sepsis related to UTI vs abscess on L upper back (s/p irrigation and debridement May, 2018)  Clinical Impression  Patient sleeping upon arrival to room, but awakens to therapist voice.  Generally disoriented, but follows simple commands (50-60%) with increased time, occasional hand-over-hand assist to complete.  Patient moaning, grimacing throughout session, but denies pain, simply voicing active, purposeful movement is "hard to do".  Globally weak and deconditioned, demonstrating 3-/5 strength in all extremities except R LE at 2+/5.  Question some degree of tone vs active resistance to movement in R LE (difficulty moving into flexion and abduct) with multi-beat, non-sustained clonus noted R ankle.  RN informed/aware.  Per wife, patient with history of back trouble that "has affected his legs"; unable to fully describe extent of deficits at baseline.  Will continue to monitor throughout stay.  Patient requiring max assist +2 for bed mobility (rolling), dep assist for management of incontinent BM.  Refused attempts at unsupported sitting despite encouragement.  Will continue to progress as able. Would benefit from skilled PT to address above deficits and promote optimal return to PLOF; recommend transition to STR upon discharge from acute hospitalization.     Follow Up Recommendations SNF    Equipment Recommendations       Recommendations for Other Services       Precautions / Restrictions Precautions Precautions: Fall Restrictions Weight Bearing Restrictions: No      Mobility  Bed Mobility Overal bed mobility: Needs Assistance Bed Mobility: Rolling Rolling: Max assist;+2 for physical assistance         General bed mobility comments: hand-over-hand to initiate UE/LE  placement and pelvic rotation  Transfers                 General transfer comment: refused attempts at sitting edge of bed  Ambulation/Gait             General Gait Details: unsafe/unable  Stairs            Wheelchair Mobility    Modified Rankin (Stroke Patients Only)       Balance                                             Pertinent Vitals/Pain Pain Assessment: Faces Faces Pain Scale: Hurts a little bit Pain Location: general - unable to rate, localize or describe Pain Intervention(s): Limited activity within patient's tolerance;Monitored during session;Repositioned    Home Living Family/patient expects to be discharged to:: Private residence Living Arrangements: Spouse/significant other Available Help at Discharge: Family;Available 24 hours/day (wife and daughter providing assist in home environment) Type of Home: House Home Access: Stairs to enter Entrance Stairs-Rails: Right;Left;Can reach both Entrance Stairs-Number of Steps: 2 Home Layout: One level Home Equipment: Walker - 2 wheels      Prior Function Level of Independence: Needs assistance         Comments: At baseline, mod indep with ADLs and mobility; however, since recent illness/infection/hospitalizations, has experienced progressive functional decline, requiring use of RW and +1 assist for all ADLs/mobility  Since recent admission (approx 2 weeks prior), has been completely bedbound, managing all ADLs, toileting, etc from bed level per wife.Marland Kitchen  Hand Dominance   Dominant Hand: Right    Extremity/Trunk Assessment   Upper Extremity Assessment Upper Extremity Assessment: Generalized weakness    Lower Extremity Assessment Lower Extremity Assessment: Generalized weakness (R LE generally 2+/5, questionable tone vs active resistance throughout R LE difficulty moving into flexion); L LE grossly 3-/5  Bilat ankles to neutral.  Multi-beat, non-sustained clonus R  ankle)       Communication   Communication: No difficulties  Cognition Arousal/Alertness: Lethargic Behavior During Therapy: Restless Overall Cognitive Status: Impaired/Different from baseline                                 General Comments: follows simple commands approx 50% time; limited awareness/insight into health condition      General Comments      Exercises Other Exercises Other Exercises: Supine LE therex, 1x5-8 bilat LEs, act assist ROM.  Limited ability to move throughout avaialble ROM without constant cuing and assist for anti-gravity movement. Other Exercises: Rolling bilat (x3 each) for management of incontinent bowel; dep for hygiene and peri-care   Assessment/Plan    PT Assessment Patient needs continued PT services  PT Problem List Decreased strength;Decreased range of motion;Decreased activity tolerance;Decreased balance;Decreased mobility;Decreased coordination;Decreased safety awareness;Cardiopulmonary status limiting activity;Obesity;Decreased skin integrity;Pain;Decreased cognition;Decreased knowledge of use of DME;Decreased knowledge of precautions;Impaired tone       PT Treatment Interventions DME instruction;Gait training;Stair training;Functional mobility training;Therapeutic activities;Therapeutic exercise;Balance training;Neuromuscular re-education;Patient/family education;Cognitive remediation    PT Goals (Current goals can be found in the Care Plan section)  Acute Rehab PT Goals Patient Stated Goal: to get some of this infection cleared up PT Goal Formulation: With patient/family Time For Goal Achievement: 12/15/16 Potential to Achieve Goals: Fair    Frequency Min 2X/week   Barriers to discharge Inaccessible home environment;Decreased caregiver support      Co-evaluation               AM-PAC PT "6 Clicks" Daily Activity  Outcome Measure Difficulty turning over in bed (including adjusting bedclothes, sheets and  blankets)?: Total Difficulty moving from lying on back to sitting on the side of the bed? : Total Difficulty sitting down on and standing up from a chair with arms (e.g., wheelchair, bedside commode, etc,.)?: Total Help needed moving to and from a bed to chair (including a wheelchair)?: Total Help needed walking in hospital room?: Total Help needed climbing 3-5 steps with a railing? : Total 6 Click Score: 6    End of Session   Activity Tolerance: Patient limited by lethargy Patient left: in bed;with bed alarm set;with call bell/phone within reach;with family/visitor present Nurse Communication: Mobility status PT Visit Diagnosis: Muscle weakness (generalized) (M62.81);Difficulty in walking, not elsewhere classified (R26.2)    Time: 8891-6945 PT Time Calculation (min) (ACUTE ONLY): 33 min   Charges:   PT Evaluation $PT Eval Low Complexity: 1 Low PT Treatments $Therapeutic Exercise: 8-22 mins $Therapeutic Activity: 8-22 mins   PT G Codes:   PT G-Codes **NOT FOR INPATIENT CLASS** Functional Assessment Tool Used: AM-PAC 6 Clicks Basic Mobility Functional Limitation: Mobility: Walking and moving around Mobility: Walking and Moving Around Current Status (W3888): At least 80 percent but less than 100 percent impaired, limited or restricted Mobility: Walking and Moving Around Goal Status [phone]): At least 20 percent but less than 40 percent impaired, limited or restricted   Marcell Pfeifer H. Owens Shark, PT, DPT, NCS 12/20/16, 10:55 PM [phone]

## 2016-12-20 NOTE — H&P (Signed)
William Kennedy is an 76 y.o. male.   Chief Complaint: Confusion HPI: He has a history of a subcutaneous abscess of the back which on May 22 had been present for about 2 months. This was incised and drained on May 22 there was copious drainage of pus. 2 Penrose drains were inserted and held in place with 3-0 nylon suture. Culture demonstrated methicillin sensitive Staphylococcus aureus. He was treated with a long course of intravenous antibiotics for that and for urinary infection and for diverticulitis. Seeing gradual but slow improvement in his Penrose drains were removed on July 16. At home he has continued to have copious drainage from the site of the left upper back. Since this recent admission for confusion he has had persistent drainage. He reports no current pain at the site  Past Medical History:  Diagnosis Date  . Abscess    shoulder  . Leukemia ([place])   I again noted his history of chronic lymphocytic leukemia. His baseline white blood count is typically over 20,000. He had recent ureteral calculus which was surgically removed. He had recent urinary tract infection. He had recent diverticulitis of the sigmoid colon. He had recent Clostridium difficile colitis    Past Surgical History:  Procedure Laterality Date  . BACK SURGERY    . CYSTOSCOPY WITH URETEROSCOPY AND STENT PLACEMENT Left 11/06/2016   Procedure: CYSTOSCOPY WITH URETEROSCOPY AND STENT PLACEMENT retrograde pyelogram,holmium laser;  Surgeon: Irine Seal, MD;  Location: [facility];  Service: Urology;  Laterality: Left;  . EXTRACORPOREAL SHOCK WAVE LITHOTRIPSY Left 11/04/2016   Procedure: EXTRACORPOREAL SHOCK WAVE LITHOTRIPSY (ESWL);  Surgeon: Hollice Espy, MD;  Location: [facility];  Service: Urology;  Laterality: Left;    Family History  Problem Relation Age of Onset  . Diabetes Mother   . Cancer Father    Social History:  reports that he has quit smoking. He has quit using smokeless tobacco. He reports that he does  not drink alcohol or use drugs.  Allergies: No Known Allergies  Medications Prior to Admission  Medication Sig Dispense Refill  . acetaminophen (TYLENOL) 325 MG tablet Take 650 mg by mouth every 4 (four) hours as needed for pain.    . cephALEXin (KEFLEX) 500 MG capsule Take 500 mg by mouth 2 (two) times daily.    . finasteride (PROSCAR) 5 MG tablet Take 1 tablet (5 mg total) by mouth daily. 30 tablet 0  . HYDROcodone-acetaminophen (NORCO/VICODIN) 5-325 MG tablet Take 1 tablet by mouth every 4 (four) hours as needed for moderate pain. 30 tablet 0  . loperamide (IMODIUM) 2 MG capsule Take 1 capsule (2 mg total) by mouth every 6 (six) hours as needed for diarrhea or loose stools. 30 capsule 0  . metoprolol tartrate (LOPRESSOR) 25 MG tablet Take 0.5 tablets (12.5 mg total) by mouth 2 (two) times daily. 30 tablet 0  . ondansetron (ZOFRAN) 4 MG tablet Take 4 mg by mouth every 8 (eight) hours as needed for nausea.    Marland Kitchen opium-belladonna (B&O SUPPRETTES) 16.2-60 MG suppository Place 1 suppository rectally 2 (two) times daily as needed for bladder spasms. 20 suppository 0  . tamsulosin (FLOMAX) 0.4 MG CAPS capsule Take 1 capsule (0.4 mg total) by mouth daily. 30 capsule 0  . vancomycin (VANCOCIN) 125 MG capsule Take 125 mg by mouth 4 (four) times daily.    . feeding supplement, ENSURE ENLIVE, (ENSURE ENLIVE) LIQD Take 237 mLs by mouth 3 (three) times daily between meals. 237 mL 12  . traMADol (ULTRAM)  50 MG tablet Take 1 tablet (50 mg total) by mouth every 6 (six) hours as needed. (Patient not taking: Reported on 12/18/2016) 30 tablet 0  . vancomycin (VANCOCIN) 50 mg/mL oral solution Take 10 mLs (500 mg total) by mouth every 6 (six) hours. (Patient not taking: Reported on 12/18/2016) 720 mL 0   He is currently being treated with intravenous Zosyn and intravenous vancomycin and continues with oral vancomycin  Review of systems: He has had continued profound generalized weakness and inability to walk. He has  been confined to bed. He has had limited oral intake. He reports no difficulty breathing and no chest pain. He reports no abdominal pain since last admission. He reports he has had some diarrhea and incontinence but that has decreased. He reports he has a chronic indwelling Foley urinary catheter. He has had some recent mental confusion. He has some history of ankle edema. Review of systems otherwise negative   Results for orders placed or performed during the hospital encounter of 12/18/16 (from the past 48 hour(s))  Basic metabolic panel     Status: None   Collection Time: 12/19/16  4:26 AM  Result Value Ref Range   Sodium 141 135 - 145 mmol/L   Potassium 3.9 3.5 - 5.1 mmol/L   Chloride 106 101 - 111 mmol/L   CO2 27 22 - 32 mmol/L   Glucose, Bld 96 65 - 99 mg/dL   BUN 11 6 - 20 mg/dL   Creatinine, Ser 1.01 0.61 - 1.24 mg/dL   Calcium 9.3 8.9 - 10.3 mg/dL   GFR calc non Af Amer >60 >60 mL/min   GFR calc Af Amer >60 >60 mL/min    Comment: (NOTE) The eGFR has been calculated using the CKD EPI equation. This calculation has not been validated in all clinical situations. eGFR's persistently <60 mL/min signify possible Chronic Kidney Disease.    Anion gap 8 5 - 15  CBC     Status: Abnormal   Collection Time: 12/19/16  4:26 AM  Result Value Ref Range   WBC 19.6 (H) 3.8 - 10.6 K/uL   RBC 4.25 (L) 4.40 - 5.90 MIL/uL   Hemoglobin 13.0 13.0 - 18.0 g/dL   HCT 38.3 (L) 40.0 - 52.0 %   MCV 90.0 80.0 - 100.0 fL   MCH 30.5 26.0 - 34.0 pg   MCHC 33.9 32.0 - 36.0 g/dL   RDW 13.9 11.5 - 14.5 %   Platelets 313 150 - 440 K/uL  MRSA PCR Screening     Status: None   Collection Time: 12/19/16 11:24 AM  Result Value Ref Range   MRSA by PCR NEGATIVE NEGATIVE    Comment:        The GeneXpert MRSA Assay (FDA approved for NASAL specimens only), is one component of a comprehensive MRSA colonization surveillance program. It is not intended to diagnose MRSA infection nor to guide or monitor  treatment for MRSA infections.   CBC     Status: Abnormal   Collection Time: 12/20/16  4:31 AM  Result Value Ref Range   WBC 17.6 (H) 3.8 - 10.6 K/uL   RBC 4.28 (L) 4.40 - 5.90 MIL/uL   Hemoglobin 13.2 13.0 - 18.0 g/dL   HCT 38.6 (L) 40.0 - 52.0 %   MCV 90.3 80.0 - 100.0 fL   MCH 30.9 26.0 - 34.0 pg   MCHC 34.2 32.0 - 36.0 g/dL   RDW 13.8 11.5 - 14.5 %   Platelets 299 150 - 440  K/uL   No results found.  Blood pressure 121/66, pulse 85, temperature 98.1 F (36.7 C), temperature source Oral, resp. rate 16, height 6' 3"  (1.905 m), weight 223 lb 6.4 oz (101.3 kg), SpO2 98 %.  Physical Exam:  GENERAL:  Awake alert and oriented and in no acute distress.  SKIN: Warm and dry. See description of back  HEENT:  Head is normocephalic.  Pupils are equal reactive to light.  Extraocular movements are intact. Sclera is clear.    NECK:  Supple with no palpable mass and no adenopathy.  LUNGS:  Clear without rales rhonchi or wheezes.  HEART:  Regular rhythm S1-S2, without murmur.  BACK: There is a localized area of mild degree of reddish blue dark discoloration of an area of the left upper back near the previous drain site which is edematous soft and nontender. There is surrounding area of mild rash formation of the remainder of the upper back which is not fluctuant but is soft and nontender. There is a small opening in the skin at the previous incision and drainage site. This is just a few millimeters in dimension. Some cloudy thin drainage can be expressed. The dressing contained a large amount of purulent-appearing drainage. The dressing was changed.  ABDOMEN: Obese soft nontender with no palpable mass.  GENITALIA: He has indwelling Foley catheter  EXTREMITIES: with some dependent edema  NEUROLOGIC:  Awake alert and oriented and moving all extremities.  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  It appears that the incision made on May 22 when the Penrose drain was inserted is now closing and likely still has underlying infection and abscess formation  I recommended incision and drainage to be done at the bedside. We'll plan for this tomorrow under local anesthesia. I discussed the operation care. This was also discussed with his nurse. Anticipate inserting Penrose drains. Also encouraged oral intake and attempt to regain walking for exercise.  Rochel Brome, MD 12/20/2016, 6:34 PM

## 2016-12-20 NOTE — Consult Note (Signed)
[facility] Infectious Disease     Reason for Consult: Back Abscess, diverticulitis, C diff   Referring Physician: Estanislado Spire Date of Admission:  12/18/2016   Active Problems:   Sepsis ([place])   Cellulitis   Urinary tract infection without hematuria  HPI: William Kennedy is a 76 y.o. male admitted with confusion and hypotension. He was being treated with IV abx for back abscess and diverticulitis but this was stopped and switched to oral keflex 7/20.   He did not follow up with me as scheduled. He also had C diff and was on oral vancomycin.   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  He had recent 2 admissions with back abscess s/p I and D and cx grew MSSA. He also was found on CT to have ruptured diverticulitis and a renal stone with mild L hydro.  At that time he had surgery evaluation and not a great surgical candidate for diverticulitis. Had recurrent diarrhea and C diff +.  He had lithotripsy done 6/14 for stone as well.  He also had to have foley placed for BOO and markedly enlarged prostate.   On this admission wbc 22 (near his baseline), no fever, but he has confusion. CXR neg, BCX UCX pending.    Seen by surgery.   Past Medical History:  Diagnosis Date  . Abscess    shoulder  . Leukemia [facility])    Past Surgical History:  Procedure Laterality Date  . BACK SURGERY    . CYSTOSCOPY WITH URETEROSCOPY AND STENT PLACEMENT Left 11/06/2016   Procedure: CYSTOSCOPY WITH URETEROSCOPY AND STENT PLACEMENT retrograde pyelogram,holmium laser;  Surgeon: Irine Seal, MD;  Location: [facility] ORS;  Service: Urology;  Laterality: Left;  . EXTRACORPOREAL SHOCK WAVE LITHOTRIPSY Left 11/04/2016   Procedure: EXTRACORPOREAL SHOCK WAVE LITHOTRIPSY (ESWL);  Surgeon: Hollice Espy, MD;  Location: [facility] ORS;  Service: Urology;  Laterality: Left;   Social History  Substance Use Topics  . Smoking status: Former Research scientist (life sciences)  . Smokeless tobacco: Former Systems developer  . Alcohol use No   Family History  Problem Relation Age of  Onset  . Diabetes Mother   . Cancer Father     Allergies: No Known Allergies  Current antibiotics: Antibiotics Given (last 72 hours)    Date/Time Action Medication Dose Rate   12/18/16 1325 New Bag/Given   piperacillin-tazobactam (ZOSYN) IVPB 3.375 g 3.375 g 100 mL/hr   12/18/16 1326 New Bag/Given   vancomycin (VANCOCIN) IVPB 1000 mg/200 mL premix 1,000 mg 200 mL/hr   12/18/16 2000 New Bag/Given   vancomycin (VANCOCIN) 1,500 mg in sodium chloride 0.9 % 500 mL IVPB 1,500 mg 250 mL/hr   12/18/16 2145 New Bag/Given   piperacillin-tazobactam (ZOSYN) IVPB 3.375 g 3.375 g 12.5 mL/hr   12/18/16 2145 Given   vancomycin (VANCOCIN) 50 mg/mL oral solution 125 mg 125 mg    12/19/16 0846 New Bag/Given   vancomycin (VANCOCIN) 1,500 mg in sodium chloride 0.9 % 500 mL IVPB 1,500 mg 250 mL/hr   12/19/16 1123 New Bag/Given   piperacillin-tazobactam (ZOSYN) IVPB 3.375 g 3.375 g 12.5 mL/hr   12/19/16 1123 Given   vancomycin (VANCOCIN) 50 mg/mL oral solution 125 mg 125 mg    12/19/16 1350 Given   vancomycin (VANCOCIN) 50 mg/mL oral solution 125 mg 125 mg    12/19/16 1834 New Bag/Given   piperacillin-tazobactam (ZOSYN) IVPB 3.375 g 3.375 g 12.5 mL/hr   12/20/16 0256 New Bag/Given   piperacillin-tazobactam (ZOSYN) IVPB 3.375 g 3.375 g 12.5  mL/hr   12/20/16 1053 New Bag/Given   piperacillin-tazobactam (ZOSYN) IVPB 3.375 g 3.375 g 12.5 mL/hr      MEDICATIONS: . feeding supplement (ENSURE ENLIVE)  237 mL Oral TID BM  . finasteride  5 mg Oral Daily  . heparin  5,000 Units Subcutaneous Q8H  . metoprolol tartrate  12.5 mg Oral BID  . tamsulosin  0.4 mg Oral Daily  . vancomycin  125 mg Oral Q6H    Review of Systems - unable to obtain   OBJECTIVE: Temp:  [98.1 F (36.7 C)-98.5 F (36.9 C)] 98.1 F (36.7 C) (07/30 1353) Pulse Rate:  [85-104] 85 (07/30 1353) Resp:  [16-20] 16 (07/30 1353) BP: (120-147)/(63-95) 121/66 (07/30 1353) SpO2:  [97 %-100 %] 98 % (07/30 1353) Physical Exam   Constitutional: He is disoriented, agitated, lying in bed  Obese, disheveled. HENT: anicteric Mouth/Throat: Oropharynx is clear and dry . No oropharyngeal exudate.  Cardiovascular: Normal rate, regular rhythm and normal heart sounds Pulmonary/Chest: Effort normal and breath sounds normal. No respiratory distress. He has no wheezes.  Abdominal: obese,  nontender PICC LUE wnl Lymphadenopathy: He has no cervical adenopathy.  Neurological: He is confused   Skin: L upper back with large area of erythema and mild induration. Has an opening middle of site but cannot express purulence. There is however dressing soaked in drainage.  Psychiatric: agitated  LABS: Results for orders placed or performed during the hospital encounter of 12/18/16 (from the past 48 hour(s))  C difficile quick scan w PCR reflex     Status: None   Collection Time: 12/18/16  6:02 PM  Result Value Ref Range   C Diff antigen NEGATIVE NEGATIVE   C Diff toxin NEGATIVE NEGATIVE   C Diff interpretation No C. difficile detected.   Basic metabolic panel     Status: None   Collection Time: 12/19/16  4:26 AM  Result Value Ref Range   Sodium 141 135 - 145 mmol/L   Potassium 3.9 3.5 - 5.1 mmol/L   Chloride 106 101 - 111 mmol/L   CO2 27 22 - 32 mmol/L   Glucose, Bld 96 65 - 99 mg/dL   BUN 11 6 - 20 mg/dL   Creatinine, Ser 1.01 0.61 - 1.24 mg/dL   Calcium 9.3 8.9 - 10.3 mg/dL   GFR calc non Af Amer >60 >60 mL/min   GFR calc Af Amer >60 >60 mL/min    Comment: (NOTE) The eGFR has been calculated using the CKD EPI equation. This calculation has not been validated in all clinical situations. eGFR's persistently <60 mL/min signify possible Chronic Kidney Disease.    Anion gap 8 5 - 15  CBC     Status: Abnormal   Collection Time: 12/19/16  4:26 AM  Result Value Ref Range   WBC 19.6 (H) 3.8 - 10.6 K/uL   RBC 4.25 (L) 4.40 - 5.90 MIL/uL   Hemoglobin 13.0 13.0 - 18.0 g/dL   HCT 38.3 (L) 40.0 - 52.0 %   MCV 90.0 80.0 - 100.0  fL   MCH 30.5 26.0 - 34.0 pg   MCHC 33.9 32.0 - 36.0 g/dL   RDW 13.9 11.5 - 14.5 %   Platelets 313 150 - 440 K/uL  MRSA PCR Screening     Status: None   Collection Time: 12/19/16 11:24 AM  Result Value Ref Range   MRSA by PCR NEGATIVE NEGATIVE    Comment:        The GeneXpert MRSA Assay (FDA approved for  NASAL specimens only), is one component of a comprehensive MRSA colonization surveillance program. It is not intended to diagnose MRSA infection nor to guide or monitor treatment for MRSA infections.   CBC     Status: Abnormal   Collection Time: 12/20/16  4:31 AM  Result Value Ref Range   WBC 17.6 (H) 3.8 - 10.6 K/uL   RBC 4.28 (L) 4.40 - 5.90 MIL/uL   Hemoglobin 13.2 13.0 - 18.0 g/dL   HCT 38.6 (L) 40.0 - 52.0 %   MCV 90.3 80.0 - 100.0 fL   MCH 30.9 26.0 - 34.0 pg   MCHC 34.2 32.0 - 36.0 g/dL   RDW 13.8 11.5 - 14.5 %   Platelets 299 150 - 440 K/uL   No components found for: ESR, C REACTIVE PROTEIN MICRO: Recent Results (from the past 720 hour(s))  Blood Culture (routine x 2)     Status: None   Collection Time: 11/30/16  2:43 PM  Result Value Ref Range Status   Specimen Description BLOOD BLOOD LEFT HAND  Final   Special Requests   Final    BOTTLES DRAWN AEROBIC AND ANAEROBIC Blood Culture results may not be optimal due to an inadequate volume of blood received in culture bottles   Culture NO GROWTH 5 DAYS  Final   Report Status 12/05/2016 FINAL  Final  Blood Culture (routine x 2)     Status: None   Collection Time: 11/30/16  3:17 PM  Result Value Ref Range Status   Specimen Description BLOOD BLOOD LEFT FOREARM  Final   Special Requests   Final    BOTTLES DRAWN AEROBIC AND ANAEROBIC Blood Culture adequate volume   Culture NO GROWTH 5 DAYS  Final   Report Status 12/05/2016 FINAL  Final  Urine culture     Status: None   Collection Time: 11/30/16  3:17 PM  Result Value Ref Range Status   Specimen Description URINE, RANDOM  Final   Special Requests NONE  Final    Culture   Final    NO GROWTH Performed at [facility], [place] [address] [place], [place] [address]    Report Status 12/02/2016 FINAL  Final  C difficile quick scan w PCR reflex     Status: None   Collection Time: 12/01/16  9:15 AM  Result Value Ref Range Status   C Diff antigen NEGATIVE NEGATIVE Final   C Diff toxin NEGATIVE NEGATIVE Final   C Diff interpretation No C. difficile detected.  Final  Culture, blood (Routine x 2)     Status: None (Preliminary result)   Collection Time: 12/18/16 12:35 PM  Result Value Ref Range Status   Specimen Description BLOOD RIGHT ANTECUBITAL  Final   Special Requests   Final    BOTTLES DRAWN AEROBIC AND ANAEROBIC Blood Culture adequate volume   Culture NO GROWTH 2 DAYS  Final   Report Status PENDING  Incomplete  Culture, blood (Routine x 2)     Status: None (Preliminary result)   Collection Time: 12/18/16 12:35 PM  Result Value Ref Range Status   Specimen Description BLOOD BLOOD LEFT WRIST  Final   Special Requests   Final    BOTTLES DRAWN AEROBIC AND ANAEROBIC Blood Culture adequate volume   Culture NO GROWTH 2 DAYS  Final   Report Status PENDING  Incomplete  Urine culture     Status: None   Collection Time: 12/18/16 12:35 PM  Result Value Ref Range Status   Specimen Description URINE,  RANDOM  Final   Special Requests NONE  Final   Culture   Final    NO GROWTH Performed at [facility], [place] [address] [place], [place] [address]    Report Status 12/19/2016 FINAL  Final  C difficile quick scan w PCR reflex     Status: None   Collection Time: 12/18/16  6:02 PM  Result Value Ref Range Status   C Diff antigen NEGATIVE NEGATIVE Final   C Diff toxin NEGATIVE NEGATIVE Final   C Diff interpretation No C. difficile detected.  Final  MRSA PCR Screening     Status: None   Collection Time: 12/19/16 11:24 AM  Result Value Ref Range Status   MRSA by PCR NEGATIVE NEGATIVE Final    Comment:        The GeneXpert MRSA Assay (FDA approved  for NASAL specimens only), is one component of a comprehensive MRSA colonization surveillance program. It is not intended to diagnose MRSA infection nor to guide or monitor treatment for MRSA infections.     IMAGING: Dg Chest 2 View  Result Date: 12/18/2016 CLINICAL DATA:  Code sepsis.  Altered mental status. EXAM: CHEST  2 VIEW COMPARISON:  11/30/2016 FINDINGS: Heart and mediastinal contours are within normal limits. No focal opacities or effusions. No acute bony abnormality. IMPRESSION: No active cardiopulmonary disease. Electronically Signed   By: Rolm Baptise M.D.   On: 12/18/2016 15:01   Dg Abd 1 View  Result Date: 12/02/2016 CLINICAL DATA:  Lithotripsy 11/04/2016 for an obstructing distal left ureteral calculus. EXAM: ABDOMEN - 1 VIEW COMPARISON:  CT abdomen and pelvis 11/30/2016, 11/05/2016, 10/31/2016 and earlier. KUB 11/01/2016. FINDINGS: The very small calculus in a mid calyx of the left kidney visualized on the CT 2 days ago is not visible on the x-ray. No visible opaque urinary tract calculi on either side. Numerous left-sided pelvic phleboliths. Aortoiliofemoral atherosclerosis. Bowel gas pattern unremarkable. IMPRESSION: No visible opaque urinary tract calculi (the very small calculus in a mid calyx of the left kidney seen on the CT 2 days ago is not visible on the x-ray). Electronically Signed   By: Evangeline Dakin M.D.   On: 12/02/2016 13:17   Ct Head Wo Contrast  Result Date: 11/30/2016 CLINICAL DATA:  Altered mental status. EXAM: CT HEAD WITHOUT CONTRAST TECHNIQUE: Contiguous axial images were obtained from the base of the skull through the vertex without intravenous contrast. COMPARISON:  None. FINDINGS: Brain: Mild diffuse cortical atrophy is noted. Mild chronic ischemic white matter disease is noted. No mass effect or midline shift is noted. Ventricular size is within normal limits. There is no evidence of mass lesion, hemorrhage or acute infarction. Vascular: No  hyperdense vessel or unexpected calcification. Skull: Normal. Negative for fracture or focal lesion. Sinuses/Orbits: No acute finding. Other: None. IMPRESSION: Mild diffuse cortical atrophy. Mild chronic ischemic white matter disease. No acute intracranial abnormality seen. Electronically Signed   By: Marijo Conception, M.D.   On: 11/30/2016 16:21   Ct Chest W Contrast  Result Date: 11/30/2016 CLINICAL DATA:  Abdominal pain and hypotension. EXAM: CT CHEST, ABDOMEN, AND PELVIS WITH CONTRAST TECHNIQUE: Multidetector CT imaging of the chest, abdomen and pelvis was performed following the standard protocol during bolus administration of intravenous contrast. CONTRAST:  54m ISOVUE-300 IOPAMIDOL (ISOVUE-300) INJECTION 61% COMPARISON:  CT scan 11/08/2016 FINDINGS: CT CHEST FINDINGS Cardiovascular: The heart is normal in size. No pericardial effusion. There is tortuosity and calcification of the thoracic aorta. The branch vessels are patent.  The advanced three-vessel coronary artery calcifications are noted. Mediastinum/Nodes: Small scattered mediastinal and hilar lymph nodes but no mass or overt adenopathy. The esophagus is grossly normal. Lungs/Pleura: Advanced emphysematous changes and pulmonary scarring. Significant breathing motion artifact no obvious pulmonary lesions. No acute pulmonary findings such as infiltrate, edema or effusion. No pneumothorax. The tracheobronchial tree is grossly normal. Chest wall/ Musculoskeletal: No chest wall mass, supraclavicular or axillary lymphadenopathy. A few small scattered lymph nodes are noted. The thyroid gland appears normal. Small amount of injected air is noted in the left subclavian vein and right internal jugular vein. No significant bony findings. CT ABDOMEN PELVIS FINDINGS Hepatobiliary: No focal hepatic lesions or intrahepatic biliary dilatation. Gallbladder is normal. No common bile duct dilatation. Pancreas: No mass, inflammation or ductal dilatation. Spleen: Normal  size.  No focal lesions. Adrenals/Urinary Tract: Stable large rim calcified right adrenal gland cyst. The left adrenal gland is unremarkable. Stable scarring changes involving the left kidney. A small left renal calculus is noted. The left-sided hydroureteronephrosis, seen on the prior study, has resolved. Stable large right renal cyst. No right-sided renal or ureteral calculi. No bladder calculi. Moderate symmetric bladder wall thickening likely due to bladder outlet obstruction from a markedly enlarged prostate gland. Stomach/Bowel: The stomach, duodenum and small bowel are unremarkable. The terminal ileum is normal. The appendix is normal. Diffuse descending and sigmoid diverticulosis with acute diverticulitis involving the sigmoid colon. There is a large inflamed diverticuli also seen on the prior study with some surrounding inflammation. No complicating features such as free air or abscess. Vascular/Lymphatic: Stable advanced atherosclerotic calcifications involving the abdominal aorta and iliac arteries. The branch vessels are patent. Small caliber left renal artery with ostial calcifications bilaterally. New line no mesenteric or retroperitoneal lymphadenopathy. Small scattered lymph nodes are stable. There are also small external iliac lymph nodes which are stable. Reproductive: Markedly enlarged prostate gland. The seminal vesicles appear normal. Other: No free pelvic fluid collection or inguinal adenopathy. Musculoskeletal: No significant bony findings. Advanced degenerative changes involving the lumbar spine. IMPRESSION: 1. Persistent or recurrent sigmoid diverticulitis without complicating features such as abscess or free air. 2. No acute or significant pulmonary findings. 3. Stable large rim calcified right adrenal gland cyst. 4. Resolution of left ureteral calculus. Small left renal calculus noted. 5. Markedly enlarged prostate gland with bladder outlet obstruction and moderate uniform bladder wall  thickening. Electronically Signed   By: Marijo Sanes M.D.   On: 11/30/2016 16:29   Ct Abdomen Pelvis W Contrast  Result Date: 11/30/2016 CLINICAL DATA:  Abdominal pain and hypotension. EXAM: CT CHEST, ABDOMEN, AND PELVIS WITH CONTRAST TECHNIQUE: Multidetector CT imaging of the chest, abdomen and pelvis was performed following the standard protocol during bolus administration of intravenous contrast. CONTRAST:  78m ISOVUE-300 IOPAMIDOL (ISOVUE-300) INJECTION 61% COMPARISON:  CT scan 11/08/2016 FINDINGS: CT CHEST FINDINGS Cardiovascular: The heart is normal in size. No pericardial effusion. There is tortuosity and calcification of the thoracic aorta. The branch vessels are patent. The advanced three-vessel coronary artery calcifications are noted. Mediastinum/Nodes: Small scattered mediastinal and hilar lymph nodes but no mass or overt adenopathy. The esophagus is grossly normal. Lungs/Pleura: Advanced emphysematous changes and pulmonary scarring. Significant breathing motion artifact no obvious pulmonary lesions. No acute pulmonary findings such as infiltrate, edema or effusion. No pneumothorax. The tracheobronchial tree is grossly normal. Chest wall/ Musculoskeletal: No chest wall mass, supraclavicular or axillary lymphadenopathy. A few small scattered lymph nodes are noted. The thyroid gland appears normal. Small amount of injected air is  noted in the left subclavian vein and right internal jugular vein. No significant bony findings. CT ABDOMEN PELVIS FINDINGS Hepatobiliary: No focal hepatic lesions or intrahepatic biliary dilatation. Gallbladder is normal. No common bile duct dilatation. Pancreas: No mass, inflammation or ductal dilatation. Spleen: Normal size.  No focal lesions. Adrenals/Urinary Tract: Stable large rim calcified right adrenal gland cyst. The left adrenal gland is unremarkable. Stable scarring changes involving the left kidney. A small left renal calculus is noted. The left-sided  hydroureteronephrosis, seen on the prior study, has resolved. Stable large right renal cyst. No right-sided renal or ureteral calculi. No bladder calculi. Moderate symmetric bladder wall thickening likely due to bladder outlet obstruction from a markedly enlarged prostate gland. Stomach/Bowel: The stomach, duodenum and small bowel are unremarkable. The terminal ileum is normal. The appendix is normal. Diffuse descending and sigmoid diverticulosis with acute diverticulitis involving the sigmoid colon. There is a large inflamed diverticuli also seen on the prior study with some surrounding inflammation. No complicating features such as free air or abscess. Vascular/Lymphatic: Stable advanced atherosclerotic calcifications involving the abdominal aorta and iliac arteries. The branch vessels are patent. Small caliber left renal artery with ostial calcifications bilaterally. New line no mesenteric or retroperitoneal lymphadenopathy. Small scattered lymph nodes are stable. There are also small external iliac lymph nodes which are stable. Reproductive: Markedly enlarged prostate gland. The seminal vesicles appear normal. Other: No free pelvic fluid collection or inguinal adenopathy. Musculoskeletal: No significant bony findings. Advanced degenerative changes involving the lumbar spine. IMPRESSION: 1. Persistent or recurrent sigmoid diverticulitis without complicating features such as abscess or free air. 2. No acute or significant pulmonary findings. 3. Stable large rim calcified right adrenal gland cyst. 4. Resolution of left ureteral calculus. Small left renal calculus noted. 5. Markedly enlarged prostate gland with bladder outlet obstruction and moderate uniform bladder wall thickening. Electronically Signed   By: Marijo Sanes M.D.   On: 11/30/2016 16:29   Dg Chest Port 1 View  Result Date: 11/30/2016 CLINICAL DATA:  Pt brought in for hypotension. He has an abcess on the left posterior shoulder and has a picc line.  Hx of leukemia. Former smoker EXAM: PORTABLE CHEST 1 VIEW COMPARISON:  Chest x-ray dated 11/05/2016. Chest x-ray dated 04/02/2010. FINDINGS: Right-sided PICC line in place with tip adequately positioned at the level of the mid/lower SVC. Heart size is upper normal, stable. Overall cardiomediastinal silhouette is stable. Coarse lung markings bilaterally suggests chronic interstitial lung disease. No new lung findings. No pleural effusion or pneumothorax seen. Osseous structures about the chest are unremarkable. Vague lucency within the soft tissues about the left shoulder is presumably related to patient's known soft tissue abscess. IMPRESSION: No active disease.  No evidence of pneumonia or pulmonary edema. Probable chronic interstitial lung disease. Electronically Signed   By: Franki Cabot M.D.   On: 11/30/2016 14:07    Assessment:   William Kennedy is a 76 y.o. male with CLL with 3rd admission in a month with hypotension and confusion. He has been treated for an MSSA back abscess as well as diverticulitis and C diff. He was on IV abx until 7/20 when he was changed to oral keflex and was supposed to have picc removed (but it was left in).  On this admit afebrile and wbc at baseline of around 22.   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 UA + but UCX negative.  C diff fu negative. BCX negtive  Recommendations Will give  IVIG given low IGG levels at last hospitalization.  Can consolidate to ancef for the MSSA from his back  He has chronic foley and with neg UCX would not treat for UTI Agree with Dr Burt Knack  he may benefit from repeat I and D. If he has this done would request repeat cultures be done.  He is severely deconditioned and I think should not be dced home but to SNF as he has proven that he cannot do well at home and has not shown up to multiple scheduled visits as otpt with me and with surgery. At dc can likely remove picc and send on oral abx.   Thank you very much  for allowing me to participate in the care of this patient. Please call with questions.   Cheral Marker. Ola Spurr, MD

## 2016-12-20 NOTE — Progress Notes (Signed)
[facility]  Patient Status: Active  [facility] is providing the following services: SN/PT  If patient discharges after hours, please call [phone].   William Kennedy 12/20/2016, 10:10 AM

## 2016-12-20 NOTE — Progress Notes (Signed)
Pharmacy Antibiotic Note  William Kennedy is a 76 y.o. male with a h/o CLL admitted on 12/18/2016 with [facility].  Pharmacy has been consulted for cefazolin dosing for MSSA abscess as well as IVIG dosing for CLL.  Plan: Cefazolin 2 g iv q 8 hours.   IVIG 400 mg/kg once.   Height: 6\' 3"  (190.5 cm) Weight: 223 lb 6.4 oz (101.3 kg) IBW/kg (Calculated) : 84.5  Temp (24hrs), Avg:98.3 F (36.8 C), Min:98.1 F (36.7 C), Max:98.5 F (36.9 C)   Recent Labs Lab 12/18/16 1235 12/19/16 0426 12/20/16 0431  WBC 22.9* 19.6* 17.6*  CREATININE 1.07 1.01  --   LATICACIDVEN 1.8  --   --     Estimated Creatinine Clearance: 75.5 mL/min (by C-G formula based on SCr of 1.01 mg/dL).    No Known Allergies   Thank you for allowing pharmacy to be a part of this patient's care.  [name] 12/20/2016 4:04 PM

## 2016-12-20 NOTE — Progress Notes (Signed)
[facility] [place] NAME: William Kennedy    MR#:  [id number]  DATE OF BIRTH:  19-May-1941  SUBJECTIVE:   Mental status is much improved today. No more pain near the Foley catheter or any discharge. Bladder spasms has improved.  REVIEW OF SYSTEMS:    Review of Systems  Constitutional: Negative for chills and fever.  HENT: Negative for congestion and tinnitus.   Eyes: Negative for blurred vision and double vision.  Respiratory: Negative for cough, shortness of breath and wheezing.   Cardiovascular: Negative for chest pain, orthopnea and PND.  Gastrointestinal: Positive for abdominal pain. Negative for diarrhea, nausea and vomiting.  Genitourinary: Negative for dysuria and hematuria.  Neurological: Negative for dizziness, sensory change and focal weakness.  All other systems reviewed and are negative.   Nutrition: Heart Healthy Tolerating Diet: Yes Tolerating PT: Await Eval.   DRUG ALLERGIES:  No Known Allergies  VITALS:  Blood pressure 121/66, pulse 85, temperature 98.1 F (36.7 C), temperature source Oral, resp. rate 16, height 6\' 3"  (1.905 m), weight 101.3 kg (223 lb 6.4 oz), SpO2 98 %.  PHYSICAL EXAMINATION:   Physical Exam  GENERAL:  76 y.o.-year-old patient lying in bed in NAD.   EYES: Pupils equal, round, reactive to light and accommodation. No scleral icterus. Extraocular muscles intact.  HEENT: Head atraumatic, normocephalic. Oropharynx and nasopharynx clear.  NECK:  Supple, no jugular venous distention. No thyroid enlargement, no tenderness.  LUNGS: Normal breath sounds bilaterally, no wheezing, rales, rhonchi. No use of accessory muscles of respiration.  CARDIOVASCULAR: S1, S2 normal. No murmurs, rubs, or gallops.  ABDOMEN: Soft, nontender, nondistended. Bowel sounds present. No organomegaly or mass.  EXTREMITIES: No cyanosis, clubbing or edema b/l.    NEUROLOGIC: Cranial nerves II through XII are intact. No focal Motor or  sensory deficits b/l. Globally weak   PSYCHIATRIC: The patient is alert and oriented x 3.  SKIN: No obvious rash, lesion, or ulcer. Left shoulder erythematous area with some minimal drainage and a small open area.  Foley catheter in place with yellow urine draining.   LABORATORY PANEL:   CBC  Recent Labs Lab 12/20/16 0431  WBC 17.6*  HGB 13.2  HCT 38.6*  PLT 299   ------------------------------------------------------------------------------------------------------------------  Chemistries   Recent Labs Lab 12/18/16 1235 12/19/16 0426  NA 138 141  K 3.8 3.9  CL 103 106  CO2 26 27  GLUCOSE 113* 96  BUN 14 11  CREATININE 1.07 1.01  CALCIUM 10.0 9.3  AST 47*  --   ALT 42  --   ALKPHOS 101  --   BILITOT 0.7  --    ------------------------------------------------------------------------------------------------------------------  Cardiac Enzymes  Recent Labs Lab 12/18/16 1235  TROPONINI <0.03   ------------------------------------------------------------------------------------------------------------------  RADIOLOGY:  No results found.   ASSESSMENT AND PLAN:   76 year old male with past medical history of CLL, hypertension, history of nephrolithiasis/BPH with chronic indwelling Foley, recent admission for sepsis secondary to left shoulder/upper back abscess, who presented to the hospital due to altered mental status.  1. Altered mental status-metabolic encephalopathy secondary to underlying suspected sepsis from suspected UTI. - Improved with IV antibiotics and mental status is back to baseline now.  2. Suspected sepsis-patient presented to the hospital due to leukocytosis, positive urinalysis, tachycardia. -Suspected source is thought to be urinary tract infection.  Cont. IV Zosyn for now and off Vanco as MRSA PCR was (-).  -Patient does have a left shoulder/upper back abscess although seen by surgery  and they do not think that this is the patient's source  of sepsis presently. Patient does need incision and drainage of this abscess and surgery plans to do it in the next 1-2 days.  3. Urinary tract infection-continue IV Zosyn. Follow urine cultures which are (-) so far.  4. Left shoulder/upper back abscess-seen by general surgery and plan for incision and drainage in the next 1-2 days. -Continue IV Zosyn.  Await input from Dr. Rochel Brome who is to see pt. Today.  5. History of nephrolithiasis/BPH with chronic indwelling Foley-patient's bladder spasms have significantly improved. -Continue B&O suppositories & supportive care. Hold off on Urology consult for now. Discussed with Dr. Jeffie Pollock.  Follow up with Urology as outpatient.  -Continue Flomax, finasteride.  6. History of CLL-white cell count is stable.  7. Essential hypertension-continue metoprolol  Discussed plan of care with wife at bedside.   All the records are reviewed and case discussed with Care Management/Social Worker. Management plans discussed with the patient, family and they are in agreement.  CODE STATUS: Full  DVT Prophylaxis: Heparin subcutaneous  TOTAL TIME TAKING CARE OF THIS PATIENT: 30 minutes.   POSSIBLE D/C IN 2-3 DAYS, DEPENDING ON CLINICAL CONDITION.   Henreitta Leber M.D on 12/20/2016 at 3:11 PM  Between 7am to 6pm - Pager - [phone]  After 6pm go to [email or web address] - [employer] [facility]  Office  [phone]  CC: Primary care physician; [name], MD

## 2016-12-20 NOTE — Progress Notes (Signed)
12/20/16  Discussed with patient and with Dr. Tamala Julian today.  Since Dr. Tamala Julian has taken care of the patient over the past few months and has done a prior I&D of his back abscess, will defer to Dr. Tamala Julian for further care.  Currently there is no emergent surgical need.  Dr. Tamala Julian will see patient today.    Please let us know if any questions or concerns.   Olean Ree, MD

## 2016-12-20 NOTE — Care Management (Signed)
Patient admitted from home with AMS.  Patient lives at home with wife.  Patient admitted with PICC line in place.  Patient open with [facility] for IV antibiotics, RN and PT.  PCP [name].  Daughter lives locally for support.  PT consult pending. RNCM following.

## 2016-12-21 LAB — CBC
HCT: 36.6 % — ABNORMAL LOW (ref 40.0–52.0)
HEMOGLOBIN: 12.5 g/dL — AB (ref 13.0–18.0)
MCH: 30.5 pg (ref 26.0–34.0)
MCHC: 34.3 g/dL (ref 32.0–36.0)
MCV: 89.1 fL (ref 80.0–100.0)
PLATELETS: 290 10*3/uL (ref 150–440)
RBC: 4.11 MIL/uL — ABNORMAL LOW (ref 4.40–5.90)
RDW: 13.6 % (ref 11.5–14.5)
WBC: 16.4 10*3/uL — ABNORMAL HIGH (ref 3.8–10.6)

## 2016-12-21 MED ORDER — LIDOCAINE-EPINEPHRINE (PF) 1 %-1:200000 IJ SOLN
20.0000 mL | Freq: Once | INTRAMUSCULAR | Status: AC
  Administered 2016-12-21: 20 mL via INTRADERMAL
  Filled 2016-12-21: qty 30

## 2016-12-21 MED ORDER — LIDOCAINE 5 % EX PTCH
1.0000 | MEDICATED_PATCH | CUTANEOUS | Status: DC
  Administered 2016-12-21 – 2016-12-27 (×6): 1 via TRANSDERMAL
  Filled 2016-12-21 (×7): qty 1

## 2016-12-21 NOTE — Clinical Social Work Note (Signed)
Clinical Social Work Assessment  Patient Details  Name: JAYMEN FETCH MRN: [id number] Date of Birth: 05-Oct-1940  Date of referral:  12/21/16               Reason for consult:  Facility Placement                Permission sought to share information with:    Permission granted to share information::   (patient confused )  Name::        Agency::     Relationship::     Contact Information:     Housing/Transportation Living arrangements for the past 2 months:  Single Family Home Source of Information:  Spouse Patient Interpreter Needed:  None Criminal Activity/Legal Involvement Pertinent to Current Situation/Hospitalization:  No - Comment as needed Significant Relationships:  Spouse, Adult Children Lives with:  Spouse Do you feel safe going back to the place where you live?  Yes Need for family participation in patient care:  Yes (Comment)  Care giving concerns:  Patient resides at home with his wife.    Social Worker assessment / plan:  CSW informed by PT that last week on patient's recent admission to the hospital, that they recommended STR. A bed search at that time was conducted and a bed was chosen (at [facility]) and [facility] authorized patient to go. However, patient's wife decided that due to patient wanting to return home (even though he was confused during that time as well), that she would take patient back home with [place]. Patient has been readmitted and CSW spoke with patient and wife this morning. Patient's wife was pleasant and cooperative with assessment. She stated that she will ask patient when he is clearer of mind if he wishes to go to rehab. She states that he more than likely will continue to want to return home. CSW discussed whether or not she feels like she can adequately care for her husband at home and if she feels as though she can physically assist with his mobility, etc. Patient's wife stated that she and her daughter were doing fine at home prior  to this hospitalization and believe that they can take patient home again. Patient's wife is in agreement with CSW conducting another bed search but does not wish for [place] to plan for him to go to rehab yet. [facility] is patient's insurance and will have to provide prior auth if rehab is indeed chosen.  Employment status:  Retired Nurse, adult PT Recommendations:    Information / Referral to community resources:     Patient/Family's Response to care:  Patient's wife expressed appreciation for CSW assistance.  Patient/Family's Understanding of and Emotional Response to Diagnosis, Current Treatment, and Prognosis:  Patient's wife believes she is aware of patient's limitations and that she and her daughter can continue to care for patient.   Emotional Assessment Appearance:  Appears stated age Attitude/Demeanor/Rapport:   (pleasant and confused) Affect (typically observed):  Calm Orientation:  Oriented to Self Alcohol / Substance use:  Not Applicable Psych involvement (Current and /or in the community):  No (Comment)  Discharge Needs  Concerns to be addressed:  Care Coordination Readmission within the last 30 days:  No Current discharge risk:  None Barriers to Discharge:  No Barriers Identified   Shela Leff, LCSW 12/21/2016, 10:59 AM

## 2016-12-21 NOTE — Progress Notes (Signed)
[facility] [place] NAME: William Kennedy    MR#:  [id number]  DATE OF BIRTH:  09-04-1940  SUBJECTIVE:   Bladder spasms and lower abdominal pain much improved and resolved now. Seen by general surgery and plan for bedside incision and drainage later today. Mental status much improved and close to baseline now.. Wife is at bedside and questions answered.  REVIEW OF SYSTEMS:    Review of Systems  Constitutional: Negative for chills and fever.  HENT: Negative for congestion and tinnitus.   Eyes: Negative for blurred vision and double vision.  Respiratory: Negative for cough, shortness of breath and wheezing.   Cardiovascular: Negative for chest pain, orthopnea and PND.  Gastrointestinal: Positive for abdominal pain. Negative for diarrhea, nausea and vomiting.  Genitourinary: Negative for dysuria and hematuria.  Neurological: Negative for dizziness, sensory change and focal weakness.  All other systems reviewed and are negative.   Nutrition: Heart Healthy Tolerating Diet: Yes Tolerating PT: Eval noted.  DRUG ALLERGIES:  No Known Allergies  VITALS:  Blood pressure 135/65, pulse 98, temperature 98.1 F (36.7 C), temperature source Oral, resp. rate 19, height 6\' 3"  (1.905 m), weight 101.3 kg (223 lb 6.4 oz), SpO2 97 %.  PHYSICAL EXAMINATION:   Physical Exam  GENERAL:  76 y.o.-year-old patient lying in bed in NAD.   EYES: Pupils equal, round, reactive to light and accommodation. No scleral icterus. Extraocular muscles intact.  HEENT: Head atraumatic, normocephalic. Oropharynx and nasopharynx clear.  NECK:  Supple, no jugular venous distention. No thyroid enlargement, no tenderness.  LUNGS: Normal breath sounds bilaterally, no wheezing, rales, rhonchi. No use of accessory muscles of respiration.  CARDIOVASCULAR: S1, S2 normal. No murmurs, rubs, or gallops.  ABDOMEN: Soft, nontender, nondistended. Bowel sounds present. No organomegaly or  mass.  EXTREMITIES: No cyanosis, clubbing or edema b/l.    NEUROLOGIC: Cranial nerves II through XII are intact. No focal Motor or sensory deficits b/l. Globally weak   PSYCHIATRIC: The patient is alert and oriented x 3.  SKIN: No obvious rash, lesion, or ulcer. Left shoulder erythematous area with penrose drain in place and minimal drainage and a small open area.  Foley catheter in place with yellow urine draining.   LABORATORY PANEL:   CBC  Recent Labs Lab 12/21/16 0906  WBC 16.4*  HGB 12.5*  HCT 36.6*  PLT 290   ------------------------------------------------------------------------------------------------------------------  Chemistries   Recent Labs Lab 12/18/16 1235 12/19/16 0426  NA 138 141  K 3.8 3.9  CL 103 106  CO2 26 27  GLUCOSE 113* 96  BUN 14 11  CREATININE 1.07 1.01  CALCIUM 10.0 9.3  AST 47*  --   ALT 42  --   ALKPHOS 101  --   BILITOT 0.7  --    ------------------------------------------------------------------------------------------------------------------  Cardiac Enzymes  Recent Labs Lab 12/18/16 1235  TROPONINI <0.03   ------------------------------------------------------------------------------------------------------------------  RADIOLOGY:  No results found.   ASSESSMENT AND PLAN:   76 year old male with past medical history of CLL, hypertension, history of nephrolithiasis/BPH with chronic indwelling Foley, recent admission for sepsis secondary to left shoulder/upper back abscess, who presented to the hospital due to altered mental status.  1. Altered mental status-metabolic encephalopathy secondary to underlying suspected sepsis from suspected UTI/left shoulder abscess. -Much improved and mental status is back to baseline now.  2. Suspected sepsis-patient presented to the hospital due to leukocytosis, positive urinalysis, tachycardia. -Suspected source was thought to be urinary tract infection.  Narrowed to IV Ancef  from Zosyn  as per ID  -Patient does have a left shoulder/upper back abscess although seen by surgery and they do not think that this is the patient's source of sepsis presently.  -Plan for incision and drainage at bedside by a general surgery for the left shoulder abscess.  3. Urinary tract infection-was on IV Zosyn but none now down to IV Ancef. Urine cultures are negative. -UTI has now been ruled out.  4. Left shoulder/upper back abscess-seen by general surgery and plan for incision and drainage at bedside today as per gen. Surgery. . - cont. IV Ancef.   5. History of nephrolithiasis/BPH with chronic indwelling Foley-patient's bladder spasms have significantly improved. -Continue B&O suppositories & supportive care.  Follow up with Urology as outpatient.  -Continue Flomax, finasteride.  6. History of CLL-white cell count is stable.  7. Essential hypertension-continue metoprolol  Seen by PT and they recommend SNF/STR and social work aware.   Discussed plan of care with wife at bedside.   All the records are reviewed and case discussed with Care Management/Social Worker. Management plans discussed with the patient, family and they are in agreement.  CODE STATUS: Full  DVT Prophylaxis: Heparin subcutaneous  TOTAL TIME TAKING CARE OF THIS PATIENT: 30 minutes.   POSSIBLE D/C IN 1-2 DAYS, DEPENDING ON CLINICAL CONDITION.   Henreitta Leber M.D on 12/21/2016 at 2:46 PM  Between 7am to 6pm - Pager - [phone]  After 6pm go to [email or web address] - [employer] [facility]  Office  [phone]  CC: Primary care physician; [name], MD

## 2016-12-21 NOTE — Progress Notes (Signed)
Physical Therapy Treatment Patient Details Name: William Kennedy MRN: [id number] DOB: 06/05/1940 Today's Date: 12/21/2016    History of Present Illness presented to ER secondary to increasing confusion; admitted with sepsis related to UTI vs abscess on L upper back (s/p irrigation and debridement May, 2018)    PT Comments    Pt initially refusing but agrees to supine exercises as described below.  Pt stated he was too fatigued from bathing/nursing care this am to get out of bed.  He had difficulty describing complaints and will start out with a few words then stop. No clonus noted today in R foot/LE.   Follow Up Recommendations  SNF     Equipment Recommendations       Recommendations for Other Services       Precautions / Restrictions Precautions Precautions: Fall Restrictions Weight Bearing Restrictions: No    Mobility  Bed Mobility               General bed mobility comments: refused  Transfers                 General transfer comment: refused attempts at sitting edge of bed  Ambulation/Gait             General Gait Details: unsafe/unable   Stairs            Wheelchair Mobility    Modified Rankin (Stroke Patients Only)       Balance                                            Cognition Arousal/Alertness: Lethargic Behavior During Therapy: WFL for tasks assessed/performed Overall Cognitive Status: Impaired/Different from baseline                                        Exercises Other Exercises Other Exercises: supine exercises BLE AAROM ankle pumps, SLR, heel slides, ab/add x 10    General Comments        Pertinent Vitals/Pain Pain Assessment: Faces Faces Pain Scale: Hurts even more Pain Location: general - unable to rate, localize or describe - grimmaces with movements Pain Descriptors / Indicators: Grimacing;Guarding;Aching;Moaning Pain Intervention(s): Limited activity within  patient's tolerance    Home Living                      Prior Function            PT Goals (current goals can now be found in the care plan section) Progress towards PT goals: Not progressing toward goals - comment    Frequency    Min 2X/week      PT Plan Current plan remains appropriate    Co-evaluation              AM-PAC PT "6 Clicks" Daily Activity  Outcome Measure  Difficulty turning over in bed (including adjusting bedclothes, sheets and blankets)?: Total Difficulty moving from lying on back to sitting on the side of the bed? : Total Difficulty sitting down on and standing up from a chair with arms (e.g., wheelchair, bedside commode, etc,.)?: Total Help needed moving to and from a bed to chair (including a wheelchair)?: Total Help needed walking in hospital room?: Total Help needed climbing 3-5 steps with a railing? :  Total 6 Click Score: 6    End of Session   Activity Tolerance: Patient limited by pain;Patient limited by fatigue Patient left: in bed;with bed alarm set;with call bell/phone within reach;with family/visitor present   Pain - Right/Left: Left Pain - part of body: Shoulder     Time: 3606-7703 PT Time Calculation (min) (ACUTE ONLY): 9 min  Charges:  $Therapeutic Exercise: 8-22 mins                    G Codes:       Chesley Noon, PTA 12/21/16, 10:43 AM

## 2016-12-21 NOTE — Op Note (Signed)
OPERATIVE REPORT  PREOPERATIVE  DIAGNOSIS: . Subcutaneous abscess of upper back  POSTOPERATIVE DIAGNOSIS: . Subcutaneous abscess of upper back  PROCEDURE: . Incision and drainage of subcutaneous abscess of the upper back  ANESTHESIA:  1% Xylocaine with epinephrine  SURGEON: Rochel Brome  MD   INDICATIONS: . He has recently been treated with incision and drainage of this site and Penrose drains were left in for many weeks. The drains were removed on July 16. He came into the hospital with evidence of infection and recurrent swelling at the site. He has been having copious drainage from the site although this was only draining through a pinhole. Incision and drainage was recommended for more adequate drainage.  With the patient on his bed in the right lateral decubitus position the site of the abscess was prepared with Betadine solution and draped with sterile fenestrated drape. The skin was infiltrated with 1% Xylocaine with epinephrine. A lancing 14 mm incision was made draining a copious straw-colored fluid which was slightly cloudy. Several ounces of fluid drained out. A swab was submitted for aerobic and anaerobic culture. A surgical instrument was inserted approximate 7 cm and manipulated through the abscess to break up any adhesions. Two quarter inch Penrose drains were cut 7 cm in length and inserted and held in place with a single 3-0 nylon suture. Multiple folded 4 x 4 gauze dressings were applied with 2 inch paper tape  The patient tolerated this well with moderate discomfort. He remained in his hospital bed for continuing care.  Anticipate leaving the Penrose drains in for a number of weeks.  Rochel Brome M.D.

## 2016-12-21 NOTE — NC FL2 (Signed)
[place] LEVEL OF CARE SCREENING TOOL     IDENTIFICATION  Patient Name: William Kennedy Birthdate: [birth year] Sex: male Admission Date (Current Location): 12/18/2016  [place] and [place] Number:  Engineering geologist and Address:  [facility], [address] [place], [place] [address]      Provider Number: [id number]  Attending Physician Name and Address:  Henreitta Leber, MD  Relative Name and Phone Number:       Current Level of Care: Hospital Recommended Level of Care: [place] Prior Approval Number:    Date Approved/Denied:   PASRR Number: [id number]  Discharge Plan: SNF    Current Diagnoses: Patient Active Problem List   Diagnosis Date Noted  . Cellulitis   . Urinary tract infection without hematuria   . Confusion 12/06/2016  . Urinary retention   . Microscopic hematuria   . Sepsis ([place]) 11/30/2016  . Left ureteral stone   . Cellulitis and abscess of trunk 10/29/2016  . CLL (chronic lymphocytic leukemia) ([place]) 10/21/2013    Orientation RESPIRATION BLADDER Height & Weight     Self  Normal Incontinent Weight: 223 lb 6.4 oz (101.3 kg) Height:  6\' 3"  (190.5 cm)  BEHAVIORAL SYMPTOMS/MOOD NEUROLOGICAL BOWEL NUTRITION STATUS   (none)  (none) Incontinent Diet (cardiac)  AMBULATORY STATUS COMMUNICATION OF NEEDS Skin   Extensive Assist Verbally Normal                       Personal Care Assistance Level of Assistance  Bathing, Dressing Bathing Assistance: Limited assistance Feeding assistance: Limited assistance Dressing Assistance: Limited assistance     Functional Limitations Info  Hearing, Sight Sight Info: Impaired Hearing Info: Impaired      SPECIAL CARE FACTORS FREQUENCY  PT (By licensed PT)                    Contractures Contractures Info: Not present    Additional Factors Info  Code Status, Allergies Code Status Info: full Allergies Info: nka           Current  Medications (12/21/2016):  This is the current hospital active medication list Current Facility-Administered Medications  Medication Dose Route Frequency Provider Last Rate Last Dose  . 0.9 %  sodium chloride infusion   Intravenous Continuous Demetrios Loll, MD 75 mL/hr at 12/21/16 [phone]    . acetaminophen (TYLENOL) tablet 650 mg  650 mg Oral Q6H PRN Demetrios Loll, MD   650 mg at 12/18/16 2318   Or  . acetaminophen (TYLENOL) suppository 650 mg  650 mg Rectal Q6H PRN Demetrios Loll, MD      . albuterol (PROVENTIL) (2.5 MG/3ML) 0.083% nebulizer solution 2.5 mg  2.5 mg Nebulization Q2H PRN Demetrios Loll, MD      . ceFAZolin (ANCEF) IVPB 2g/100 mL premix  2 g Intravenous Q8H Napoleon Form, [place]   Stopped at 12/21/16 7989  . feeding supplement (ENSURE ENLIVE) (ENSURE ENLIVE) liquid 237 mL  237 mL Oral TID BM Demetrios Loll, MD   237 mL at 12/20/16 2218  . finasteride (PROSCAR) tablet 5 mg  5 mg Oral Daily Demetrios Loll, MD   5 mg at 12/20/16 1053  . heparin injection 5,000 Units  5,000 Units Subcutaneous Q8H Demetrios Loll, MD   5,000 Units at 12/21/16 0540  . HYDROcodone-acetaminophen (NORCO/VICODIN) 5-325 MG per tablet 1-2 tablet  1-2 tablet Oral Q4H PRN Demetrios Loll, MD   2 tablet at 12/20/16 2217  .  lidocaine-EPINEPHrine (XYLOCAINE-EPINEPHrine) 1 %-1:200000 (PF) injection 30 mL  30 mL Intradermal Once Leonie Green, MD      . liver oil-zinc oxide (DESITIN) 40 % ointment   Topical TID Lance Coon, MD      . loperamide (IMODIUM) capsule 2 mg  2 mg Oral Q6H PRN Demetrios Loll, MD   2 mg at 12/20/16 2217  . metoprolol tartrate (LOPRESSOR) tablet 12.5 mg  12.5 mg Oral BID Demetrios Loll, MD   12.5 mg at 12/20/16 2217  . ondansetron (ZOFRAN) tablet 4 mg  4 mg Oral Q6H PRN Demetrios Loll, MD       Or  . ondansetron [facility]) injection 4 mg  4 mg Intravenous Q6H PRN Demetrios Loll, MD      . opium-belladonna (B&O SUPPRETTES) 16.2-60 MG suppository 1 suppository  1 suppository Rectal BID PRN Demetrios Loll, MD   1 suppository at 12/19/16 1015   . senna-docusate (Senokot-S) tablet 1 tablet  1 tablet Oral QHS PRN Demetrios Loll, MD      . tamsulosin [facility]) capsule 0.4 mg  0.4 mg Oral Daily Demetrios Loll, MD   0.4 mg at 12/20/16 1053     Discharge Medications: Please see discharge summary for a list of discharge medications.  Relevant Imaging Results:  Relevant Lab Results:   Additional Information ss: [id number]  Shela Leff, LCSW

## 2016-12-21 NOTE — Progress Notes (Signed)
[place] INFECTIOUS DISEASE PROGRESS NOTE Date of Admission:  12/18/2016     ID: William Kennedy is a 76 y.o. male with MSSA back abscess Active Problems:   Sepsis ([place])   Cellulitis   Urinary tract infection without hematuria   Subjective: More alert, no fevers.   ROS  Unable to obtain   Medications:  Antibiotics Given (last 72 hours)    Date/Time Action Medication Dose Rate   12/18/16 2000 New Bag/Given   vancomycin (VANCOCIN) 1,500 mg in sodium chloride 0.9 % 500 mL IVPB 1,500 mg 250 mL/hr   12/18/16 2145 New Bag/Given   piperacillin-tazobactam (ZOSYN) IVPB 3.375 g 3.375 g 12.5 mL/hr   12/18/16 2145 Given   vancomycin (VANCOCIN) 50 mg/mL oral solution 125 mg 125 mg    12/19/16 0846 New Bag/Given   vancomycin (VANCOCIN) 1,500 mg in sodium chloride 0.9 % 500 mL IVPB 1,500 mg 250 mL/hr   12/19/16 1123 New Bag/Given   piperacillin-tazobactam (ZOSYN) IVPB 3.375 g 3.375 g 12.5 mL/hr   12/19/16 1123 Given   vancomycin (VANCOCIN) 50 mg/mL oral solution 125 mg 125 mg    12/19/16 1350 Given   vancomycin (VANCOCIN) 50 mg/mL oral solution 125 mg 125 mg    12/19/16 1834 New Bag/Given   piperacillin-tazobactam (ZOSYN) IVPB 3.375 g 3.375 g 12.5 mL/hr   12/20/16 0256 New Bag/Given   piperacillin-tazobactam (ZOSYN) IVPB 3.375 g 3.375 g 12.5 mL/hr   12/20/16 1053 New Bag/Given   piperacillin-tazobactam (ZOSYN) IVPB 3.375 g 3.375 g 12.5 mL/hr   12/20/16 1555 Given   vancomycin (VANCOCIN) 50 mg/mL oral solution 125 mg 125 mg    12/20/16 2216 New Bag/Given   ceFAZolin (ANCEF) IVPB 2g/100 mL premix 2 g 200 mL/hr   12/21/16 0540 New Bag/Given   ceFAZolin (ANCEF) IVPB 2g/100 mL premix 2 g 200 mL/hr   12/21/16 1513 New Bag/Given   ceFAZolin (ANCEF) IVPB 2g/100 mL premix 2 g 200 mL/hr     . feeding supplement (ENSURE ENLIVE)  237 mL Oral TID BM  . finasteride  5 mg Oral Daily  . heparin  5,000 Units Subcutaneous Q8H  . lidocaine  1 patch Transdermal Q24H  . lidocaine-EPINEPHrine   20 mL Intradermal Once  . lidocaine-EPINEPHrine  30 mL Intradermal Once  . liver oil-zinc oxide   Topical TID  . metoprolol tartrate  12.5 mg Oral BID  . tamsulosin  0.4 mg Oral Daily    Objective: Vital signs in last 24 hours: Temp:  [98.1 F (36.7 C)-98.8 F (37.1 C)] 98.1 F (36.7 C) (07/31 1200) Pulse Rate:  [98-103] 98 (07/31 1200) Resp:  [18-20] 19 (07/31 1200) BP: (135-140)/(61-65) 135/65 (07/31 1200) SpO2:  [96 %-100 %] 97 % (07/31 1200) Constitutional: He is disoriented, calm , lying in bed  Obese, disheveled. HENT: anicteric Mouth/Throat: Oropharynx is clear and dry . No oropharyngeal exudate.  Cardiovascular: Normal rate, regular rhythm and normal heart sounds Pulmonary/Chest: Effort normal and breath sounds normal. No respiratory distress. He has no wheezes.  Abdominal: obese,  nontender PICC LUE wnl Lymphadenopathy: He has no cervical adenopathy.  Neurological: He is confused   Skin: L upper back with large area of erythema and mild induration. Has an opening middle of site but cannot express purulence. There is however dressing soaked in drainage.  Psychiatric: calm  Lab Results  Recent Labs  12/19/16 0426 12/20/16 0431 12/21/16 0906  WBC 19.6* 17.6* 16.4*  HGB 13.0 13.2 12.5*  HCT 38.3* 38.6* 36.6*  NA 141  --   --  K 3.9  --   --   CL 106  --   --   CO2 27  --   --   BUN 11  --   --   CREATININE 1.01  --   --     Microbiology: Results for orders placed or performed during the hospital encounter of 12/18/16  Culture, blood (Routine x 2)     Status: None (Preliminary result)   Collection Time: 12/18/16 12:35 PM  Result Value Ref Range Status   Specimen Description BLOOD RIGHT ANTECUBITAL  Final   Special Requests   Final    BOTTLES DRAWN AEROBIC AND ANAEROBIC Blood Culture adequate volume   Culture NO GROWTH 3 DAYS  Final   Report Status PENDING  Incomplete  Culture, blood (Routine x 2)     Status: None (Preliminary result)   Collection Time:  12/18/16 12:35 PM  Result Value Ref Range Status   Specimen Description BLOOD BLOOD LEFT WRIST  Final   Special Requests   Final    BOTTLES DRAWN AEROBIC AND ANAEROBIC Blood Culture adequate volume   Culture NO GROWTH 3 DAYS  Final   Report Status PENDING  Incomplete  Urine culture     Status: None   Collection Time: 12/18/16 12:35 PM  Result Value Ref Range Status   Specimen Description URINE, RANDOM  Final   Special Requests NONE  Final   Culture   Final    NO GROWTH Performed at [facility], [place] [address] [place], [place] [address]    Report Status 12/19/2016 FINAL  Final  C difficile quick scan w PCR reflex     Status: None   Collection Time: 12/18/16  6:02 PM  Result Value Ref Range Status   C Diff antigen NEGATIVE NEGATIVE Final   C Diff toxin NEGATIVE NEGATIVE Final   C Diff interpretation No C. difficile detected.  Final  MRSA PCR Screening     Status: None   Collection Time: 12/19/16 11:24 AM  Result Value Ref Range Status   MRSA by PCR NEGATIVE NEGATIVE Final    Comment:        The GeneXpert MRSA Assay (FDA approved for NASAL specimens only), is one component of a comprehensive MRSA colonization surveillance program. It is not intended to diagnose MRSA infection nor to guide or monitor treatment for MRSA infections.      Studies/Results: No results found.  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 He has been treated for an MSSA back abscess as well as diverticulitis and C diff. He was on IV abx until 7/20 when he was changed to oral keflex and was supposed to have picc removed (but it was left in).  On this admit afebrile and wbc at baseline of around 22.   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 UA + but UCX negative.  C diff fu negative. [place] negtive  Recommendations S/p  IVIG given low IGG levels at last hospitalization.  Cont  ancef for the  MSSA from his back  He has chronic foley and with neg UCX would not treat for UTI For bedside I and D this PM He is severely deconditioned and I think should not be dced home but to SNF as he has proven that he cannot do well at home and has not shown up to multiple scheduled visits  as otpt with me and with surgery. At dc can likely remove picc and send on oral abx.  you very much for the consult. Will follow with you.  Takumi Din P   12/21/2016, 4:54 PM

## 2016-12-22 LAB — CBC
HEMATOCRIT: 36 % — AB (ref 40.0–52.0)
Hemoglobin: 12.4 g/dL — ABNORMAL LOW (ref 13.0–18.0)
MCH: 30.3 pg (ref 26.0–34.0)
MCHC: 34.3 g/dL (ref 32.0–36.0)
MCV: 88.4 fL (ref 80.0–100.0)
Platelets: 268 10*3/uL (ref 150–440)
RBC: 4.07 MIL/uL — ABNORMAL LOW (ref 4.40–5.90)
RDW: 13.5 % (ref 11.5–14.5)
WBC: 15.8 10*3/uL — AB (ref 3.8–10.6)

## 2016-12-22 MED ORDER — ENOXAPARIN SODIUM 40 MG/0.4ML ~~LOC~~ SOLN
40.0000 mg | SUBCUTANEOUS | Status: DC
  Administered 2016-12-22 – 2016-12-25 (×3): 40 mg via SUBCUTANEOUS
  Filled 2016-12-22 (×4): qty 0.4

## 2016-12-22 MED ORDER — CYCLOBENZAPRINE HCL 10 MG PO TABS
5.0000 mg | ORAL_TABLET | Freq: Two times a day (BID) | ORAL | Status: AC
  Administered 2016-12-22: 5 mg via ORAL
  Filled 2016-12-22: qty 1

## 2016-12-22 MED ORDER — SODIUM CHLORIDE 0.9% FLUSH
10.0000 mL | INTRAVENOUS | Status: DC | PRN
  Administered 2016-12-26: 21:00:00 10 mL
  Filled 2016-12-22: qty 40

## 2016-12-22 NOTE — Progress Notes (Signed)
[place] [facility] [place] NAME: William Kennedy    MR#:  [id number]  DATE OF BIRTH:  1941-04-09  SUBJECTIVE:  CHIEF COMPLAINT:   Chief Complaint  Patient presents with  . Altered Mental Status   -Admitted with worsening pain on the back from an abscess, was getting IV antibiotics via PICC line. -Status post drainage of his back abscess for the second time yesterday, still has significant pain around it. Some confusion noted. -Wife at bedside  REVIEW OF SYSTEMS:  Review of Systems  Constitutional: Positive for malaise/fatigue. Negative for chills and fever.  HENT: Negative for congestion, ear discharge, hearing loss and nosebleeds.   Eyes: Negative for blurred vision and double vision.  Respiratory: Negative for cough, shortness of breath and wheezing.   Cardiovascular: Negative for chest pain, palpitations and leg swelling.  Gastrointestinal: Negative for abdominal pain, constipation, diarrhea, nausea and vomiting.  Genitourinary: Negative for dysuria.  Musculoskeletal: Positive for back pain, joint pain and myalgias.  Neurological: Negative for dizziness, speech change, focal weakness, seizures and headaches.  Psychiatric/Behavioral: Negative for depression.    DRUG ALLERGIES:  No Known Allergies  VITALS:  Blood pressure (!) 145/63, pulse 96, temperature 98.1 F (36.7 C), temperature source Oral, resp. rate 19, height 6\' 3"  (1.905 m), weight 101.3 kg (223 lb 6.4 oz), SpO2 98 %.  PHYSICAL EXAMINATION:  Physical Exam  GENERAL:  76 y.o.-year-old Elderly patient lying in the bed and seems to be miserable secondary to back pain.  EYES: Pupils equal, round, reactive to light and accommodation. No scleral icterus. Extraocular muscles intact.  HEENT: Head atraumatic, normocephalic. Oropharynx and nasopharynx clear.  NECK:  Supple, no jugular venous distention. No thyroid enlargement, no tenderness.  LUNGS: Normal breath sounds bilaterally, no  wheezing, rales,rhonchi or crepitation. No use of accessory muscles of respiration.  CARDIOVASCULAR: S1, S2 normal. No  rubs, or gallops. 2/6 systolic murmur is present ABDOMEN: Soft, nontender, nondistended. Bowel sounds present. No organomegaly or mass.  EXTREMITIES: No pedal edema, cyanosis, or clubbing.  On the left subscapular region on the upper back there is hyperpigmentation due to recent procedure, a drain left in noted, there is a firm indurated tender areas noted around the dressing. NEUROLOGIC: Cranial nerves II through XII are intact. Muscle strength 5/5 in all extremities. Sensation intact. Gait not checked. Global weakness present PSYCHIATRIC: The patient is alert and oriented x 1-2.  SKIN: No obvious rash, lesion, or ulcer.    LABORATORY PANEL:   CBC  Recent Labs Lab 12/22/16 0549  WBC 15.8*  HGB 12.4*  HCT 36.0*  PLT 268   ------------------------------------------------------------------------------------------------------------------  Chemistries   Recent Labs Lab 12/18/16 1235 12/19/16 0426  NA 138 141  K 3.8 3.9  CL 103 106  CO2 26 27  GLUCOSE 113* 96  BUN 14 11  CREATININE 1.07 1.01  CALCIUM 10.0 9.3  AST 47*  --   ALT 42  --   ALKPHOS 101  --   BILITOT 0.7  --    ------------------------------------------------------------------------------------------------------------------  Cardiac Enzymes  Recent Labs Lab 12/18/16 1235  TROPONINI <0.03   ------------------------------------------------------------------------------------------------------------------  RADIOLOGY:  No results found.  EKG:   Orders placed or performed during the hospital encounter of 12/18/16  . ED EKG 12-Lead  . ED EKG 12-Lead  . EKG 12-Lead  . EKG 12-Lead  . EKG    ASSESSMENT AND PLAN:   76 year old male with past medical history significant for CLL, hypertension, BPH with chronic  indwelling Foley catheter, subacute left upper back abscess presents to  hospital secondary to altered mental status.  #1 metabolic encephalopathy-has been going on and off for the last 2 months since his abscess has started. -Partly from infection and partly from the pain medications he has been receiving. -He is oriented to self and also situation, person but not to time. -Wife states that he is better at home than being in the hospital.  #2 sepsis-secondary to left upper back abscess, status post lancing in May 2018 and on Rocephin IV for MSSA. With fluctuation, surgical consult again called. -Status post incision and drainage again yesterday, cultures are pending. On IV Ancef at this time. -Appreciate ID consult. Still has indurated area surrounding the drained region, continue to monitor. -Has a Penrose drain in for more drainage -Continue pain medications as tolerated  #3 BPH and chronic Foley catheter-no evidence of infection. Has chronic conization. -Denies any bladder spasms at this time. -Continue Flomax and finasteride  #4 CLL-white count is actually improving. Continue to monitor  #5 DVT prophylaxis-on subcutaneous heparin  Physical therapy recommended rehabilitation, however patient and wife -want him to go home if possible. -Continue to encourage ambulation while in the hospital     All the records are reviewed and case discussed with Care Management/Social Workerr. Management plans discussed with the patient, family and they are in agreement.  CODE STATUS: Full code  TOTAL TIME TAKING CARE OF THIS PATIENT: 38 minutes.   POSSIBLE D/C IN 2-3 DAYS, DEPENDING ON CLINICAL CONDITION.   Rustin Erhart M.D on 12/22/2016 at 1:35 PM  Between 7am to 6pm - Pager - [phone]  After 6pm go to [email or web address] - password [email or web address] [facility]  Office  [phone]  CC: Primary care physician; [name], MD

## 2016-12-22 NOTE — Plan of Care (Signed)
Problem: Physical Regulation: Goal: Ability to maintain clinical measurements within normal limits will improve Outcome: Not Progressing Pt is refusing to ambulate or even do ROM in bed due to pain. PRN pain medications were given to relieve the pain but pt still did not want to move even within the bed. RN will continue to encourage mobility.   Russell Engelstad [employer]

## 2016-12-23 ENCOUNTER — Inpatient Hospital Stay (HOSPITAL_COMMUNITY)
Admit: 2016-12-23 | Discharge: 2016-12-23 | Disposition: A | Discharge status: Home / Self Care | Payer: BLUE CROSS/BLUE SHIELD | Attending: Internal Medicine | Admitting: Internal Medicine

## 2016-12-23 ENCOUNTER — Inpatient Hospital Stay: Payer: BLUE CROSS/BLUE SHIELD

## 2016-12-23 DIAGNOSIS — I6350 Cerebral infarction due to unspecified occlusion or stenosis of unspecified cerebral artery: Secondary | ICD-10-CM

## 2016-12-23 DIAGNOSIS — I639 Cerebral infarction, unspecified: Secondary | ICD-10-CM

## 2016-12-23 LAB — BASIC METABOLIC PANEL
ANION GAP: 7 (ref 5–15)
BUN: 9 mg/dL (ref 6–20)
CHLORIDE: 107 mmol/L (ref 101–111)
CO2: 27 mmol/L (ref 22–32)
Calcium: 9.1 mg/dL (ref 8.9–10.3)
Creatinine, Ser: 0.8 mg/dL (ref 0.61–1.24)
GFR calc non Af Amer: >60 mL/min (ref >60)
GLUCOSE: 110 mg/dL — AB (ref 65–99)
POTASSIUM: 3.4 mmol/L — AB (ref 3.5–5.1)
Sodium: 141 mmol/L (ref 135–145)

## 2016-12-23 LAB — LIPID PANEL
Cholesterol: 144 mg/dL (ref 0–200)
HDL: 23 mg/dL — ABNORMAL LOW (ref >40)
LDL Cholesterol: 87 mg/dL (ref 0–99)
TRIGLYCERIDES: 170 mg/dL — AB (ref <150)
Total CHOL/HDL Ratio: 6.3 RATIO
VLDL: 34 mg/dL (ref 0–40)

## 2016-12-23 LAB — CULTURE, BLOOD (ROUTINE X 2)
CULTURE: NO GROWTH
Culture: NO GROWTH
Special Requests: ADEQUATE
Special Requests: ADEQUATE

## 2016-12-23 MED ORDER — ATORVASTATIN CALCIUM 20 MG PO TABS
40.0000 mg | ORAL_TABLET | Freq: Every day | ORAL | Status: DC
  Administered 2016-12-23 – 2016-12-27 (×4): 40 mg via ORAL
  Filled 2016-12-23 (×4): qty 2

## 2016-12-23 MED ORDER — POTASSIUM CHLORIDE CRYS ER 20 MEQ PO TBCR
40.0000 meq | EXTENDED_RELEASE_TABLET | Freq: Once | ORAL | Status: AC
  Administered 2016-12-23: 40 meq via ORAL
  Filled 2016-12-23: qty 2

## 2016-12-23 MED ORDER — ASPIRIN 81 MG PO CHEW
81.0000 mg | CHEWABLE_TABLET | Freq: Every day | ORAL | Status: DC
  Administered 2016-12-23 – 2016-12-27 (×5): 81 mg via ORAL
  Filled 2016-12-23 (×5): qty 1

## 2016-12-23 MED ORDER — CLOPIDOGREL BISULFATE 75 MG PO TABS
75.0000 mg | ORAL_TABLET | Freq: Every day | ORAL | Status: DC
  Administered 2016-12-23 – 2016-12-27 (×5): 75 mg via ORAL
  Filled 2016-12-23 (×5): qty 1

## 2016-12-23 MED ORDER — ASPIRIN 325 MG PO TABS
325.0000 mg | ORAL_TABLET | Freq: Once | ORAL | Status: AC
  Administered 2016-12-23: 325 mg via ORAL
  Filled 2016-12-23: qty 1

## 2016-12-23 MED ORDER — GADOBENATE DIMEGLUMINE 529 MG/ML IV SOLN
20.0000 mL | Freq: Once | INTRAVENOUS | Status: AC | PRN
  Administered 2016-12-23: 20 mL via INTRAVENOUS

## 2016-12-23 NOTE — Progress Notes (Signed)
Pt spouse came to desk verbalizing that her husband could not lift his right arm. Primary RN along with two other RN went and assessed pt. Vitals were taking. VSS, Pt was noted not able to lift right arm with weak hand grip to the  right arm. Dr. Estanislado Pandy notified and orders received for a stat CT of the head without contrast. Vitals were taken once more after pt returned from CT scan. VSS. Impression of CT scan was called and verbalized to Dr. Estanislado Pandy. Orders received for Asprin  325 once;  Neuro Checks Q 4 hr;  Neurology consult for the AM. Spouse at bedside. Primary RN to continue to monitor.

## 2016-12-23 NOTE — Progress Notes (Signed)
MRI/MRA results reviewed with radiology and also Neurology String sign, left ICA and possible thrombus- impending whole left hemispheric infarct Multiple infarcts on left sie and may a right one per neurology Discussed with patient and wife and considering transfer to [facility] to see if IR can do any thrombolysis/thrombectomy of left MCA But patient refuses transfer- unhappy that any place he gets transferred to will give procedures and take a piece out of him Explained about the possibility of worsening stroke, cerebral edema and death. He says ' I am willing to take my chances, and I'm not ready to give up yet.'  Discussed with wife and she is willing to comply to what he says though she wants him transferred. Advised to call their daughters to see if they can talk him into transfer or considering further options. Stat ECHO requested Palliative care consulted

## 2016-12-23 NOTE — Progress Notes (Signed)
Primary Nurse  notified Dr. Tressia Miners of pt increased weakness in pts right lower extremity. Dr Tressia Miners was also updated in the pts condition throughout the night in which the pt started experiencing right arm weakness  and received a CT of the head. Dr. Tressia Miners to place orders for an MRI of the head.

## 2016-12-23 NOTE — Evaluation (Signed)
Clinical/Bedside Swallow Evaluation Patient Details  Name: William Kennedy MRN: [id number] Date of Birth: 12-16-1940  Today's Date: 12/23/2016 Time: SLP Start Time (ACUTE ONLY): 1430 SLP Stop Time (ACUTE ONLY): 1530 SLP Time Calculation (min) (ACUTE ONLY): 60 min  Past Medical History:  Past Medical History:  Diagnosis Date  . Abscess    shoulder  . Leukemia [facility])    Past Surgical History:  Past Surgical History:  Procedure Laterality Date  . BACK SURGERY    . CYSTOSCOPY WITH URETEROSCOPY AND STENT PLACEMENT Left 11/06/2016   Procedure: CYSTOSCOPY WITH URETEROSCOPY AND STENT PLACEMENT retrograde pyelogram,holmium laser;  Surgeon: Irine Seal, MD;  Location: [facility] ORS;  Service: Urology;  Laterality: Left;  . EXTRACORPOREAL SHOCK WAVE LITHOTRIPSY Left 11/04/2016   Procedure: EXTRACORPOREAL SHOCK WAVE LITHOTRIPSY (ESWL);  Surgeon: Hollice Espy, MD;  Location: [facility] ORS;  Service: Urology;  Laterality: Left;   HPI:  Pt is a 76 y.o. male with a known history of Sepsis, Encephalopathy, back abscess w/ recent drainage, leukemia and urine retention/UTI. The patient was discharged about 2 weeks ago after treatment of sepsis due to back abscess. He was discharged with antibiotics IV via PICC line. He was fine until 2 days ago, he was found confused. In the ED, he was found septic and given antibiotics. Since admission, pt has had drainage of his back abscess for the second time yesterday, still has significant pain around it; some confusion noted as well. During the night, NSG reported a change in status revealing Speech is clear, no trouble swallowing, but has dense right-sided hemiplegia of U/LEs. 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 MD discussed with patient and wife and considering transfer to [facility] to see if IR can do any thrombolysis/thrombectomy of left MCA.  Patient refused transfer; MD encouraged wife and family to talk w/ him about transfer for further management. Pt is verbally conversive but w/ inconsistent hesitation at times; speech is clear; pt is able to follow general instructions w/ only few cues. Pt stated he was expecting "bad news" from SLP but wanted to to "try eating/drinking".    Assessment / Plan / Recommendation Clinical Impression  Pt appears to present w/ adequate oropharyngeal phase swallow function w/ trials of thin liquids and purees/soft solids given; no overt s/s of aspiration occurring w/ trials during this eval. Pt was fed trials of ice chips then thin liquids(pt assisted by holding cup/straw) and purees/soft solids exhibiting adequate oral phase bolus control and oral clearing b/t trials - min increased time attending to the mech soft food trials d/t edentulous status. No coughing or throat clearing noted during/post trials of po's/thin liquids; clear vocal quality assessed post trials. Pt required feeding support d/t RUE weakness and encouragement to sit more upright for any po intake. Pt does suffer from sore back area d/t abscess but complied and was able to benefit from a towel roll behind head to support more forward for oral intake. Recommend a dysphagia level 3 diet w/ some added comdiments to moisten foods; thin liquids. Recommend general aspiration precautions and encouragement to sit more upright during eating/drinking; recommend pills given in puree - if needed for easier swallowing. Recommend feeding support at all meals. ST services will be available for reconsult if any change in status while admitted. NSG updated.  SLP Visit Diagnosis: Dysphagia, oropharyngeal phase (R13.12)    Aspiration Risk   (reduced )  Diet Recommendation  Dysphagia level 3(mech soft) w/ Thin liquids; general aspiration precautions and feeding support d/t RUE weakness.   Medication Administration: Whole meds with puree (as needed for easier  swallowing)    Other  Recommendations Recommended Consults:  (Dietician f/u) Oral Care Recommendations: Oral care BID;Staff/trained caregiver to provide oral care   Follow up Recommendations None      Frequency and Duration            Prognosis Prognosis for Safe Diet Advancement: Good Barriers to Reach Goals:  (declined medical status; cva)      Swallow Study   General Date of Onset: 12/18/16 HPI: Pt is a 76 y.o. male with a known history of Sepsis, Encephalopathy, back abscess w/ recent drainage, leukemia and urine retention/UTI. The patient was discharged about 2 weeks ago after treatment of sepsis due to back abscess. He was discharged with antibiotics IV via PICC line. He was fine until 2 days ago, he was found confused. In the ED, he was found septic and given antibiotics. Since admission, pt has had drainage of his back abscess for the second time yesterday, still has significant pain around it; some confusion noted as well. During the night, NSG reported a change in status revealing Speech is clear, no trouble swallowing, but has dense right-sided hemiplegia of U/LEs. 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 MD discussed with patient and wife and considering transfer to [facility] to see if IR can do any thrombolysis/thrombectomy of left MCA. Patient refused transfer; MD encouraged wife and family to talk w/ him about transfer for further management. Pt is verbally conversive but w/ inconsistent hesitation at times; speech is clear; pt is able to follow general instructions w/ only few cues. Pt stated he was expecting "bad news" from SLP but wanted to to "try eating/drinking".  Type of Study: Bedside Swallow Evaluation Previous Swallow Assessment: none Diet Prior to this Study: Regular;Thin liquids (prior to last night) Temperature Spikes Noted: No (wbc  15.8) Respiratory Status: Room air History of Recent Intubation: No Behavior/Cognition: Alert;Cooperative;Pleasant mood (some discomfort - back) Oral Cavity Assessment: Within Functional Limits Oral Care Completed by SLP: Recent completion by staff Oral Cavity - Dentition: Edentulous Vision: Functional for self-feeding Self-Feeding Abilities: Able to feed self;Needs assist;Needs set up;Total assist (R UE weakness) Patient Positioning: Upright in bed;Postural control adequate for testing (back discomfort from abscess) Baseline Vocal Quality: Normal Volitional Cough: Cognitively unable to elicit Volitional Swallow: Able to elicit    Oral/Motor/Sensory Function Overall Oral Motor/Sensory Function: Within functional limits   Ice Chips Ice chips: Within functional limits Presentation: Spoon (fed; 2 trials)   Thin Liquid Thin Liquid: Within functional limits Presentation: Cup;Self Fed;Straw (~12 ozs total) Other Comments: pt does drink Ensure frequently at home per wife    Nectar Thick Nectar Thick Liquid: Not tested   Honey Thick Honey Thick Liquid: Not tested   Puree Puree: Within functional limits Presentation: Spoon (fed; 6 trials)   Solid   GO   Solid: Impaired (mech soft foods) Presentation: Spoon (fed; 4 trials) Oral Phase Impairments: Impaired mastication (edentulous) Oral Phase Functional Implications: Impaired mastication (edentulous) Pharyngeal Phase Impairments:  (none) Other Comments: moistening foods and given time, pt was able to adequately manage soft food boluses        Orinda Kenner, MS, CCC-SLP Krishay Faro 12/23/2016,6:36 PM

## 2016-12-23 NOTE — Progress Notes (Signed)
*  PRELIMINARY RESULTS* Echocardiogram 2D Echocardiogram has been performed.  William Kennedy 12/23/2016, 12:12 PM

## 2016-12-23 NOTE — Progress Notes (Addendum)
[facility] NAME: William Kennedy    MR#:  [id number]  DATE OF BIRTH:  09-28-1940  SUBJECTIVE:  CHIEF COMPLAINT:   Chief Complaint  Patient presents with  . Altered Mental Status   -Admitted for sepsis secondary to abscess on the upper back which was drained. Had significant pain yesterday. -Noted by last night are in that he went to bed well after eating and drinking. Around 3 AM this morning he woke his wife up because he was unable to move his right arm. Neuro assessment at that time confirmed a right arm weakness and a stat CT did not reveal any acute findings. Around 7 AM again this morning they are in noted that his right leg was weaker than last night and called the physician. -Speech is clear, no trouble swallowing, but has dense right-sided hemiplegia. Stat MRI has been ordered and neurology notified  REVIEW OF SYSTEMS:  Review of Systems  Constitutional: Positive for malaise/fatigue. Negative for chills and fever.  HENT: Negative for congestion, ear discharge, hearing loss and nosebleeds.   Eyes: Negative for blurred vision and double vision.  Respiratory: Negative for cough, shortness of breath and wheezing.   Cardiovascular: Negative for chest pain, palpitations and leg swelling.  Gastrointestinal: Negative for abdominal pain, constipation, diarrhea, nausea and vomiting.  Genitourinary: Negative for dysuria.  Musculoskeletal: Positive for back pain, joint pain and myalgias.  Neurological: Positive for sensory change and focal weakness. Negative for dizziness, speech change, seizures and headaches.  Psychiatric/Behavioral: Negative for depression.    DRUG ALLERGIES:  No Known Allergies  VITALS:  Blood pressure 137/67, pulse 88, temperature 98.5 F (36.9 C), temperature source Oral, resp. rate 18, height 6\' 3"  (1.905 m), weight 101.3 kg (223 lb 6.4 oz), SpO2 97 %.  PHYSICAL EXAMINATION:  Physical Exam  GENERAL:  76  y.o.-year-old Elderly patient lying in the bed and seems to be miserable secondary to back pain.  EYES: Pupils equal, round, reactive to light and accommodation. No scleral icterus. Extraocular muscles intact.  HEENT: Head atraumatic, normocephalic. Oropharynx and nasopharynx clear.  NECK:  Supple, no jugular venous distention. No thyroid enlargement, no tenderness.  LUNGS: Normal breath sounds bilaterally, no wheezing, rales,rhonchi or crepitation. No use of accessory muscles of respiration.  CARDIOVASCULAR: S1, S2 normal. No  rubs, or gallops. 2/6 systolic murmur is present ABDOMEN: Soft, nontender, nondistended. Bowel sounds present. No organomegaly or mass.  EXTREMITIES: No pedal edema, cyanosis, or clubbing.  On the left subscapular region on the upper back there is hyperpigmentation due to recent procedure, a drain left in noted, there is a firm indurated tender areas noted around the dressing. NEUROLOGIC: Cranial nerves II through XII are intact. Right upper extremity strength is 1/5, with minimal right hand grip, and right lower extremity strength is 2/5 at this time. Sensation is intact but has paresthesias on the right upper arm. Left upper and lower extremity strength that at baseline 5/5 Sensation intact. Gait not checked. PSYCHIATRIC: The patient is alert and oriented x 2.  SKIN: No obvious rash, lesion, or ulcer.    LABORATORY PANEL:   CBC  Recent Labs Lab 12/22/16 0549  WBC 15.8*  HGB 12.4*  HCT 36.0*  PLT 268   ------------------------------------------------------------------------------------------------------------------  Chemistries   Recent Labs Lab 12/18/16 1235  12/23/16 0333  NA 138  < > 141  K 3.8  < > 3.4*  CL 103  < > 107  CO2 26  < >  27  GLUCOSE 113*  < > 110*  BUN 14  < > 9  CREATININE 1.07  < > 0.80  CALCIUM 10.0  < > 9.1  AST 47*  --   --   ALT 42  --   --   ALKPHOS 101  --   --   BILITOT 0.7  --   --   < > = values in this interval not  displayed. ------------------------------------------------------------------------------------------------------------------  Cardiac Enzymes  Recent Labs Lab 12/18/16 1235  TROPONINI <0.03   ------------------------------------------------------------------------------------------------------------------  RADIOLOGY:  Ct Head Wo Contrast  Result Date: 12/23/2016 CLINICAL DATA:  New onset right arm weakness. EXAM: CT HEAD WITHOUT CONTRAST TECHNIQUE: Contiguous axial images were obtained from the base of the skull through the vertex without intravenous contrast. COMPARISON:  11/30/2016 FINDINGS: Brain: Diffuse cerebral atrophy. Mild ventricular dilatation consistent with central atrophy. Low-attenuation changes in the deep white matter consistent small vessel ischemia. Cavum septum pellucidum. Small area of old encephalomalacia in the left posterior parietal lobe unchanged since previous study. No evidence of acute infarction, hemorrhage, hydrocephalus, extra-axial collection or mass lesion/mass effect. Vascular: Vascular calcifications are present. Skull: No depressed skull fractures. Sinuses/Orbits: Paranasal sinuses and mastoid air cells are clear. Other: No change since previous study. IMPRESSION: No acute intracranial abnormalities. Chronic atrophy and small vessel ischemic changes. 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 No change since previous studies. Electronically Signed   By: Lucienne Capers M.D.   On: 12/23/2016 03:35    EKG:   Orders placed or performed during the hospital encounter of 12/18/16  . ED EKG 12-Lead  . ED EKG 12-Lead  . EKG 12-Lead  . EKG 12-Lead  . EKG    ASSESSMENT AND PLAN:   76 year old male with past medical history significant for CLL, hypertension, BPH with chronic indwelling Foley catheter, subacute left upper back abscess presents to hospital secondary to altered mental status.  #1 New onset right-sided weakness-concern for  acute CVA -CT of the head without any acute deficits done this morning. Last noted to be normal last night before he went to bed, symptoms onset unsure. Woke up at 3 AM with heavy right arm and paresthesias. -Progressive weakness noted to the right lower extremity at this time. MRI, MRA ordered. -Neuro checks. Received aspirin early this a.m. Check lipid panel -Speech consult, PT/OT consults requested -Neurology consulted. -Discussed with wife at bedside and also the patient  #2 sepsis-secondary to left upper back abscess, status post lancing in May 2018 and on Rocephin IV for MSSA. With fluctuation, surgical consult again called. -Status post incision and drainage again, cultures are negative so far. On IV Ancef at this time. -Appreciate ID consult. Still has indurated area surrounding the drained region, continue to monitor. -Has a Penrose drain in for more drainage -Continue pain medications as tolerated -Change to oral antibiotics at discharge  #3 BPH and chronic Foley catheter-no evidence of infection. Has chronic conization. -Denies any bladder spasms at this time. -Continue Flomax and finasteride  #4 CLL-white count is actually improving. Continue to monitor  #5 DVT prophylaxis-on subcutaneous heparin  Will need to have reevaluation with physical therapy and occupational therapy with the new neurological changes     All the records are reviewed and case discussed with Care Management/Social Workerr. Management plans discussed with the patient, family and they are in agreement.  CODE STATUS: Full code  TOTAL CRITICAL CARE TIME TAKING CARE OF THIS PATIENT: 43 minutes.   POSSIBLE D/C  IN 2-3 DAYS, DEPENDING ON CLINICAL CONDITION.   Gladstone Lighter M.D on 12/23/2016 at 8:33 AM  Between 7am to 6pm - Pager - [phone]  After 6pm go to [email or web address] - password [email or web address] [facility]  Office  [phone]  CC: Primary care physician; [name], MD

## 2016-12-23 NOTE — Progress Notes (Signed)
Foley catheter removed and new one without temp probe reinserted per MD order for patient to have MRI.

## 2016-12-23 NOTE — Progress Notes (Signed)
He had repeat incision and drainage of an abscess of the left upper back on 12/21/2016.  I changed his dressing yesterday and did continue to have serous drainage.  Gram stain demonstrated white blood cells but no bacteria. Culture demonstrated no growth in 24 hours.  Diagnosis abscess of the upper back which does not currently appear to be a source of sepsis.  Probably does not need any more antibiotic treatment of the wound of the back.  Plan is to continue dressing changes 2 times per day.  He can follow-up in my office in approximately 3 weeks to have the Penrose drain removed.

## 2016-12-23 NOTE — Progress Notes (Signed)
PT Cancellation Note  Patient Details Name: William Kennedy MRN: [id number] DOB: [birth year]   Cancelled Treatment:    Reason Eval/Treat Not Completed: Patient at procedure or test/unavailable   Attempted x 2 this am 9:30 and 11:18.  Pt out of room for testing.  Will continue as appropriate.   Chesley Noon 12/23/2016, 11:21 AM

## 2016-12-23 NOTE — Progress Notes (Signed)
Primary Nurse assessed pt and noted that pt had more ride sided weakness in lower extremity. Right upper extremity still with weakness. Primary RN paged MD. Awaiting on callback. Primary RN to continue to monitor pt.

## 2016-12-23 NOTE — Consult Note (Signed)
Referring Physician: Tressia Miners    Chief Complaint: Right sided weakness  HPI: William Kennedy is an 76 y.o. male admitted for abscess who per report of wife was at baseline about midnight.  Patient then about 3AM awakened his wife complaining of right arm weakness.  By 0700 leg was involved as well and hospitalist made aware.  There has been no improvement in the right sided weakness.  Initial NIHSS of 5.  Date last known well: Date: 12/23/2016 Time last known well: Time: 00:00 tPA Given: No: On Lovenox  Past Medical History:  Diagnosis Date  . Abscess    shoulder  . Leukemia [facility])     Past Surgical History:  Procedure Laterality Date  . BACK SURGERY    . CYSTOSCOPY WITH URETEROSCOPY AND STENT PLACEMENT Left 11/06/2016   Procedure: CYSTOSCOPY WITH URETEROSCOPY AND STENT PLACEMENT retrograde pyelogram,holmium laser;  Surgeon: Irine Seal, MD;  Location: [facility] ORS;  Service: Urology;  Laterality: Left;  . EXTRACORPOREAL SHOCK WAVE LITHOTRIPSY Left 11/04/2016   Procedure: EXTRACORPOREAL SHOCK WAVE LITHOTRIPSY (ESWL);  Surgeon: Hollice Espy, MD;  Location: [facility] ORS;  Service: Urology;  Laterality: Left;    Family History  Problem Relation Age of Onset  . Diabetes Mother   . Cancer Father    Social History:  reports that he has quit smoking. He has quit using smokeless tobacco. He reports that he does not drink alcohol or use drugs.  Allergies: No Known Allergies  Medications:  I have reviewed the patient's current medications. Prior to Admission:  Prescriptions Prior to Admission  Medication Sig Dispense Refill Last Dose  . acetaminophen (TYLENOL) 325 MG tablet Take 650 mg by mouth every 4 (four) hours as needed for pain.   prn at prn  . [EXPIRED] cephALEXin (KEFLEX) 500 MG capsule Take 500 mg by mouth 2 (two) times daily.   12/18/2016 at am  . finasteride (PROSCAR) 5 MG tablet Take 1 tablet (5 mg total) by mouth daily. 30 tablet 0 12/18/2016 at am  . HYDROcodone-acetaminophen  (NORCO/VICODIN) 5-325 MG tablet Take 1 tablet by mouth every 4 (four) hours as needed for moderate pain. 30 tablet 0 12/18/2016 at 0200  . loperamide (IMODIUM) 2 MG capsule Take 1 capsule (2 mg total) by mouth every 6 (six) hours as needed for diarrhea or loose stools. 30 capsule 0 prn at prn  . metoprolol tartrate (LOPRESSOR) 25 MG tablet Take 0.5 tablets (12.5 mg total) by mouth 2 (two) times daily. 30 tablet 0 12/17/2016 at pm  . ondansetron (ZOFRAN) 4 MG tablet Take 4 mg by mouth every 8 (eight) hours as needed for nausea.   prn at PRN  . opium-belladonna (B&O SUPPRETTES) 16.2-60 MG suppository Place 1 suppository rectally 2 (two) times daily as needed for bladder spasms. 20 suppository 0 prn at prn  . tamsulosin (FLOMAX) 0.4 MG CAPS capsule Take 1 capsule (0.4 mg total) by mouth daily. 30 capsule 0 12/18/2016 at am  . vancomycin (VANCOCIN) 125 MG capsule Take 125 mg by mouth 4 (four) times daily.   12/18/2016 at am  . feeding supplement, ENSURE ENLIVE, (ENSURE ENLIVE) LIQD Take 237 mLs by mouth 3 (three) times daily between meals. 237 mL 12   . traMADol (ULTRAM) 50 MG tablet Take 1 tablet (50 mg total) by mouth every 6 (six) hours as needed. (Patient not taking: Reported on 12/18/2016) 30 tablet 0 Completed Course at Unknown time  . [EXPIRED] vancomycin (VANCOCIN) 50 mg/mL oral solution Take 10 mLs (500 mg total)  by mouth every 6 (six) hours. (Patient not taking: Reported on 12/18/2016) 720 mL 0 Not Taking at Unknown time   Scheduled: . aspirin  81 mg Oral Daily  . atorvastatin  40 mg Oral q1800  . cyclobenzaprine  5 mg Oral BID  . enoxaparin (LOVENOX) injection  40 mg Subcutaneous Q24H  . feeding supplement (ENSURE ENLIVE)  237 mL Oral TID BM  . finasteride  5 mg Oral Daily  . lidocaine  1 patch Transdermal Q24H  . lidocaine-EPINEPHrine  30 mL Intradermal Once  . liver oil-zinc oxide   Topical TID  . metoprolol tartrate  12.5 mg Oral BID  . tamsulosin  0.4 mg Oral Daily    ROS: History  obtained from the patient  General ROS: negative for - chills, fatigue, fever, night sweats, weight gain or weight loss Psychological ROS: negative for - behavioral disorder, hallucinations, memory difficulties, mood swings or suicidal ideation Ophthalmic ROS: negative for - blurry vision, double vision, eye pain or loss of vision ENT ROS: negative for - epistaxis, nasal discharge, oral lesions, sore throat, tinnitus or vertigo Allergy and Immunology ROS: negative for - hives or itchy/watery eyes Hematological and Lymphatic ROS: negative for - bleeding problems, bruising or swollen lymph nodes Endocrine ROS: negative for - galactorrhea, hair pattern changes, polydipsia/polyuria or temperature intolerance Respiratory ROS: negative for - cough, hemoptysis, shortness of breath or wheezing Cardiovascular ROS: negative for - chest pain, dyspnea on exertion, edema or irregular heartbeat Gastrointestinal ROS: negative for - abdominal pain, diarrhea, hematemesis, nausea/vomiting or stool incontinence Genito-Urinary ROS: negative for - dysuria, hematuria, incontinence or urinary frequency/urgency Musculoskeletal ROS: neck pain Neurological ROS: as noted in HPI Dermatological ROS: negative for rash and skin lesion changes  Physical Examination: Blood pressure (!) 158/59, pulse 94, temperature 97.8 F (36.6 C), temperature source Oral, resp. rate 16, height 6\' 3"  (1.905 m), weight 101.3 kg (223 lb 6.4 oz), SpO2 99 %.  HEENT-  Normocephalic, no lesions, without obvious abnormality.  Normal external eye and conjunctiva.  Normal TM's bilaterally.  Normal auditory canals and external ears. Normal external nose, mucus membranes and septum.  Normal pharynx. Cardiovascular- S1, S2 normal, pulses palpable throughout   Lungs- chest clear, no wheezing, rales, normal symmetric air entry Abdomen- soft, non-tender; bowel sounds normal; no masses,  no organomegaly Extremities- no edema Lymph-no adenopathy  palpable Musculoskeletal-no joint tenderness, deformity or swelling  Neurological Examination   Mental Status: Alert, oriented, thought content appropriate.  Speech fluent without evidence of aphasia.  Able to follow 3 step commands without difficulty. Cranial Nerves: II: Discs flat bilaterally; Partial right field cut, pupils equal, round, reactive to light and accommodation III,IV, VI: ptosis not present, extra-ocular motions intact bilaterally V,VII: smile symmetric, facial light touch sensation normal bilaterally VIII: hearing normal bilaterally IX,X: gag reflex present XI: bilateral shoulder shrug XII: midline tongue extension Motor: Right : Upper extremity   1-2/5    Left:     Upper extremity   5/5  Lower extremity   1-2/5     Lower extremity   5/5 Tone and bulk:normal tone throughout; no atrophy noted Sensory: Pinprick and light touch intact throughout, bilaterally Deep Tendon Reflexes: 2+ and symmetric with absent AJ's bilaterally Plantars: Right: mute   Left: mute Cerebellar: Normal finger-to-nose and normal heel-to-shin testing on the left Gait: not tested due to safety concerns    Laboratory Studies:  Basic Metabolic Panel:  Recent Labs Lab 12/18/16 1235 12/19/16 0426 12/23/16 0333  NA 138 141 141  K 3.8 3.9 3.4*  CL 103 106 107  CO2 26 27 27   GLUCOSE 113* 96 110*  BUN 14 11 9   CREATININE 1.07 1.01 0.80  CALCIUM 10.0 9.3 9.1    Liver Function Tests:  Recent Labs Lab 12/18/16 1235  AST 47*  ALT 42  ALKPHOS 101  BILITOT 0.7  PROT 6.6  ALBUMIN 3.1*   No results for input(s): LIPASE, AMYLASE in the last 168 hours. No results for input(s): AMMONIA in the last 168 hours.  CBC:  Recent Labs Lab 12/18/16 1235 12/19/16 0426 12/20/16 0431 12/21/16 0906 12/22/16 0549  WBC 22.9* 19.6* 17.6* 16.4* 15.8*  NEUTROABS 10.0*  --   --   --   --   HGB 14.5 13.0 13.2 12.5* 12.4*  HCT 41.5 38.3* 38.6* 36.6* 36.0*  MCV 89.3 90.0 90.3 89.1 88.4  PLT 351  313 299 290 268    Cardiac Enzymes:  Recent Labs Lab 12/18/16 1235  TROPONINI <0.03    BNP: Invalid input(s): POCBNP  CBG: No results for input(s): GLUCAP in the last 168 hours.  Microbiology: Results for orders placed or performed during the hospital encounter of 12/18/16  Culture, blood (Routine x 2)     Status: None   Collection Time: 12/18/16 12:35 PM  Result Value Ref Range Status   Specimen Description BLOOD RIGHT ANTECUBITAL  Final   Special Requests   Final    BOTTLES DRAWN AEROBIC AND ANAEROBIC Blood Culture adequate volume   Culture NO GROWTH 5 DAYS  Final   Report Status 12/23/2016 FINAL  Final  Culture, blood (Routine x 2)     Status: None   Collection Time: 12/18/16 12:35 PM  Result Value Ref Range Status   Specimen Description BLOOD BLOOD LEFT WRIST  Final   Special Requests   Final    BOTTLES DRAWN AEROBIC AND ANAEROBIC Blood Culture adequate volume   Culture NO GROWTH 5 DAYS  Final   Report Status 12/23/2016 FINAL  Final  Urine culture     Status: None   Collection Time: 12/18/16 12:35 PM  Result Value Ref Range Status   Specimen Description URINE, RANDOM  Final   Special Requests NONE  Final   Culture   Final    NO GROWTH Performed at [facility], [place] [address], [place], [place] [address]    Report Status 12/19/2016 FINAL  Final  C difficile quick scan w PCR reflex     Status: None   Collection Time: 12/18/16  6:02 PM  Result Value Ref Range Status   C Diff antigen NEGATIVE NEGATIVE Final   C Diff toxin NEGATIVE NEGATIVE Final   C Diff interpretation No C. difficile detected.  Final  MRSA PCR Screening     Status: None   Collection Time: 12/19/16 11:24 AM  Result Value Ref Range Status   MRSA by PCR NEGATIVE NEGATIVE Final    Comment:        The GeneXpert MRSA Assay (FDA approved for NASAL specimens only), is one component of a comprehensive MRSA colonization surveillance program. It is not intended to diagnose MRSA infection nor  to guide or monitor treatment for MRSA infections.   Aerobic/Anaerobic Culture (surgical/deep wound)     Status: None (Preliminary result)   Collection Time: 12/21/16 12:28 PM  Result Value Ref Range Status   Specimen Description BACK  Final   Special Requests Immunocompromised  Final   Gram Stain   Final    ABUNDANT WBC  PRESENT, PREDOMINANTLY PMN NO ORGANISMS SEEN    Culture   Final    NO GROWTH 2 DAYS Performed at [facility] [address], [place], [place] [address]    Report Status PENDING  Incomplete    Coagulation Studies: No results for input(s): LABPROT, INR in the last 72 hours.  Urinalysis:  Recent Labs Lab 12/18/16 1235  COLORURINE RED*  LABSPEC 1.027  PHURINE TEST NOT REPORTED DUE TO COLOR INTERFERENCE OF URINE PIGMENT  GLUCOSEU TEST NOT REPORTED DUE TO COLOR INTERFERENCE OF URINE PIGMENT*  HGBUR TEST NOT REPORTED DUE TO COLOR INTERFERENCE OF URINE PIGMENT*  BILIRUBINUR TEST NOT REPORTED DUE TO COLOR INTERFERENCE OF URINE PIGMENT*  KETONESUR TEST NOT REPORTED DUE TO COLOR INTERFERENCE OF URINE PIGMENT*  PROTEINUR TEST NOT REPORTED DUE TO COLOR INTERFERENCE OF URINE PIGMENT*  NITRITE TEST NOT REPORTED DUE TO COLOR INTERFERENCE OF URINE PIGMENT*  LEUKOCYTESUR TEST NOT REPORTED DUE TO COLOR INTERFERENCE OF URINE PIGMENT*    Lipid Panel:    Component Value Date/Time   CHOL 144 12/23/2016 0330   TRIG 170 (H) 12/23/2016 0330   HDL 23 (L) 12/23/2016 0330   CHOLHDL 6.3 12/23/2016 0330   VLDL 34 12/23/2016 0330   LDLCALC 87 12/23/2016 0330    HgbA1C: No results found for: HGBA1C  Urine Drug Screen:  No results found for: LABOPIA, COCAINSCRNUR, LABBENZ, AMPHETMU, THCU, LABBARB  Alcohol Level: No results for input(s): ETH in the last 168 hours.  Other results: EKG: sinus rhythm at 92 bpm with PVC's.  Imaging: Ct Head Wo Contrast  Result Date: 12/23/2016 CLINICAL DATA:  New onset right arm weakness. EXAM: CT HEAD WITHOUT CONTRAST TECHNIQUE:  Contiguous axial images were obtained from the base of the skull through the vertex without intravenous contrast. COMPARISON:  11/30/2016 FINDINGS: Brain: Diffuse cerebral atrophy. Mild ventricular dilatation consistent with central atrophy. Low-attenuation changes in the deep white matter consistent small vessel ischemia. Cavum septum pellucidum. Small area of old encephalomalacia in the left posterior parietal lobe unchanged since previous study. No evidence of acute infarction, hemorrhage, hydrocephalus, extra-axial collection or mass lesion/mass effect. Vascular: Vascular calcifications are present. Skull: No depressed skull fractures. Sinuses/Orbits: Paranasal sinuses and mastoid air cells are clear. Other: No change since previous study. IMPRESSION: No acute intracranial abnormalities. Chronic atrophy and small vessel ischemic changes. 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 No change since previous studies. Electronically Signed   By: Lucienne Capers M.D.   On: 12/23/2016 03:35   Mr [name] Head Wo Contrast  Addendum Date: 12/23/2016   ADDENDUM REPORT: 12/23/2016 11:20 ADDENDUM: Study discussed by telephone with Dr. Gladstone Lighter on 12/23/2016 at 1113 hours. Electronically Signed   By: Genevie Ann M.D.   On: 12/23/2016 11:20   Result Date: 12/23/2016 CLINICAL DATA:  76 year old male recently treated for sepsis. New onset right upper extremity weakness upon waking at 0300 hours. Weakness progressing to the right leg. Subsequent dense right side hemiplegia. EXAM: MRI HEAD WITHOUT CONTRAST MRA HEAD WITHOUT CONTRAST MRA NECK WITHOUT AND WITH CONTRAST TECHNIQUE: Multiplanar, multiecho pulse sequences of the brain and surrounding structures were obtained without intravenous contrast. Angiographic images of the Circle of Willis were obtained using MRA technique without intravenous contrast. Angiographic images of the neck were obtained using MRA technique without and with intravenous  contrast. Carotid stenosis measurements (when applicable) are obtained utilizing NASCET criteria, using the distal internal carotid diameter as the denominator. CONTRAST:  28mL MULTIHANCE GADOBENATE DIMEGLUMINE 529 MG/ML IV  SOLN COMPARISON:  Head CT without contrast 0323 hours today. FINDINGS: MRI HEAD FINDINGS Brain: Patchy acute areas of restricted diffusion in the left hemisphere including the left superior frontal gyrus, motor strip, sensory strip, pre motor area, left centrum semiovale, and also in the left superior occipital lobe and occipital pole cortex. No contralateral right hemisphere or posterior fossa restricted diffusion identified. T2 and FLAIR hyperintensity in the affected areas with no associated hemorrhage or mass effect. Underlying inferior left parietal lobe cortical encephalomalacia and hemosiderin (series 14, image 15). Patchy and confluent bilateral superimposed cerebral white matter T2 and FLAIR hyperintensity. Small chronic microhemorrhage also in the right occipital lobe white matter. 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 Vascular: Absent flow void in the distal right vertebral artery, preserved distal left vertebral artery and basilar flow void. ICA siphon flow voids are preserved. Cavum septum pellucidum anatomic variant. No ventriculomegaly. No midline shift. Skull and upper cervical spine: Negative. Sinuses/Orbits: Negative aside from mild mucosal thickening and/or fluid in the left sphenoid sinus. Other: Trace right mastoid fluid. Visible internal auditory structures appear normal. Negative scalp soft tissues. MRA NECK FINDINGS Precontrast time-of-flight images demonstrate asymmetrically decreased antegrade flow signal in the cervical left ICA. Bilateral CCA flow signal appears fairly symmetric. Preserved right ICA flow signal. Flow signal in the left vertebral artery which appears dominant in the neck. Post-contrast MRA images reveal a radiographic  string sign stenosis at the left ICA origin, and highly irregular contour of the left ICA bulb with additional mild stenosis at the distal bulb level. See series 26, image 52 and series 110, image 16. Despite these findings there is preserved enhancement of the left ICA to the skullbase and into the left ICA siphon. Only mild irregularity of the right CCA and cervical right ICA. Proximal subclavian arteries are patent but irregular. The right vertebral artery is occluded from its origin to the V3 segment with only faint enhancement in the V4 segment and at the right PICA 0 which may be retrograde. There is mild stenosis at the left vertebral artery origin. The left vertebral artery is otherwise normal to the vertebrobasilar junction. MRA HEAD FINDINGS Absent antegrade flow in the distal right vertebral artery. Preserved flow in the distal left vertebral artery and basilar. SCA and right PCA origins are patent. The left P1 segment is irregular and stenotic. Left posterior communicating artery appears to be the primary source of left PCA flow the left P2 segment is normal. Left P3 branches are attenuated. Right PCA branches have a more normal appearance. Right posterior communicating artery is diminutive or absent. Antegrade flow in the right ICA siphon to the right terminus with no stenosis. Patent right MCA and ACA origins. Decreased antegrade flow signal in the vertical petrous and proximal cavernous segment of the left ICA siphon (series 104, image 6), but the supraclinoid segment and left ICA to sinus are patent. Left MCA origin and M1 segment are patent with only mild irregularity. Left MCA bifurcation is patent. No left MCA branch occlusion is identified. The left ACA origin is severely stenotic. Patent anterior communicating artery. A2 segments appear symmetric and within normal limits. Moderate stenosis at the right MCA origin, but visible right MCA branches are otherwise within normal limits. IMPRESSION: 1.  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 No associated hemorrhage or mass effect. 2. MRA is positive for high-grade Radiographic String Sign stenosis at the left ICA origin, with tandem severe stenosis or thrombosis in  the mid left ICA siphon. Patent left MCA, and no left MCA branch occlusion identified. 3. Proximal left PCA P1 stenosis with left posterior communicating artery contribution to the left PCA, corresponding to the left PCA territory involvement. 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 Electronically Signed: By: Genevie Ann M.D. On: 12/23/2016 10:56   Mr [name] Neck W Wo Contrast  Addendum Date: 12/23/2016   ADDENDUM REPORT: 12/23/2016 11:20 ADDENDUM: Study discussed by telephone with Dr. Gladstone Lighter on 12/23/2016 at 1113 hours. Electronically Signed   By: Genevie Ann M.D.   On: 12/23/2016 11:20   Result Date: 12/23/2016 CLINICAL DATA:  76 year old male recently treated for sepsis. New onset right upper extremity weakness upon waking at 0300 hours. Weakness progressing to the right leg. Subsequent dense right side hemiplegia. EXAM: MRI HEAD WITHOUT CONTRAST MRA HEAD WITHOUT CONTRAST MRA NECK WITHOUT AND WITH CONTRAST TECHNIQUE: Multiplanar, multiecho pulse sequences of the brain and surrounding structures were obtained without intravenous contrast. Angiographic images of the Circle of Willis were obtained using MRA technique without intravenous contrast. Angiographic images of the neck were obtained using MRA technique without and with intravenous contrast. Carotid stenosis measurements (when applicable) are obtained utilizing NASCET criteria, using the distal internal carotid diameter as the denominator. CONTRAST:  60mL MULTIHANCE GADOBENATE DIMEGLUMINE 529 MG/ML IV SOLN COMPARISON:  Head CT without contrast 0323 hours today. FINDINGS: MRI HEAD FINDINGS Brain: Patchy acute areas of restricted  diffusion in the left hemisphere including the left superior frontal gyrus, motor strip, sensory strip, pre motor area, left centrum semiovale, and also in the left superior occipital lobe and occipital pole cortex. No contralateral right hemisphere or posterior fossa restricted diffusion identified. T2 and FLAIR hyperintensity in the affected areas with no associated hemorrhage or mass effect. Underlying inferior left parietal lobe cortical encephalomalacia and hemosiderin (series 14, image 15). Patchy and confluent bilateral superimposed cerebral white matter T2 and FLAIR hyperintensity. Small chronic microhemorrhage also in the right occipital lobe white matter. 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 Vascular: Absent flow void in the distal right vertebral artery, preserved distal left vertebral artery and basilar flow void. ICA siphon flow voids are preserved. Cavum septum pellucidum anatomic variant. No ventriculomegaly. No midline shift. Skull and upper cervical spine: Negative. Sinuses/Orbits: Negative aside from mild mucosal thickening and/or fluid in the left sphenoid sinus. Other: Trace right mastoid fluid. Visible internal auditory structures appear normal. Negative scalp soft tissues. MRA NECK FINDINGS Precontrast time-of-flight images demonstrate asymmetrically decreased antegrade flow signal in the cervical left ICA. Bilateral CCA flow signal appears fairly symmetric. Preserved right ICA flow signal. Flow signal in the left vertebral artery which appears dominant in the neck. Post-contrast MRA images reveal a radiographic string sign stenosis at the left ICA origin, and highly irregular contour of the left ICA bulb with additional mild stenosis at the distal bulb level. See series 26, image 52 and series 110, image 16. Despite these findings there is preserved enhancement of the left ICA to the skullbase and into the left ICA siphon. Only mild irregularity of the right CCA  and cervical right ICA. Proximal subclavian arteries are patent but irregular. The right vertebral artery is occluded from its origin to the V3 segment with only faint enhancement in the V4 segment and at the right PICA 0 which may be retrograde. There is mild stenosis at the left vertebral artery origin. The left vertebral  artery is otherwise normal to the vertebrobasilar junction. MRA HEAD FINDINGS Absent antegrade flow in the distal right vertebral artery. Preserved flow in the distal left vertebral artery and basilar. SCA and right PCA origins are patent. The left P1 segment is irregular and stenotic. Left posterior communicating artery appears to be the primary source of left PCA flow the left P2 segment is normal. Left P3 branches are attenuated. Right PCA branches have a more normal appearance. Right posterior communicating artery is diminutive or absent. Antegrade flow in the right ICA siphon to the right terminus with no stenosis. Patent right MCA and ACA origins. Decreased antegrade flow signal in the vertical petrous and proximal cavernous segment of the left ICA siphon (series 104, image 6), but the supraclinoid segment and left ICA to sinus are patent. Left MCA origin and M1 segment are patent with only mild irregularity. Left MCA bifurcation is patent. No left MCA branch occlusion is identified. The left ACA origin is severely stenotic. Patent anterior communicating artery. A2 segments appear symmetric and within normal limits. Moderate stenosis at the right MCA origin, but visible right MCA branches are otherwise within normal limits. IMPRESSION: 1. 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 No associated hemorrhage or mass effect. 2. 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 Patent left MCA, and no left MCA branch occlusion identified. 3. Proximal left PCA P1 stenosis  with left posterior communicating artery contribution to the left PCA, corresponding to the left PCA territory involvement. 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 Electronically Signed: By: Genevie Ann M.D. On: 12/23/2016 10:56   Mr Brain Wo Contrast  Addendum Date: 12/23/2016   ADDENDUM REPORT: 12/23/2016 11:20 ADDENDUM: Study discussed by telephone with Dr. Gladstone Lighter on 12/23/2016 at 1113 hours. Electronically Signed   By: Genevie Ann M.D.   On: 12/23/2016 11:20   Result Date: 12/23/2016 CLINICAL DATA:  76 year old male recently treated for sepsis. New onset right upper extremity weakness upon waking at 0300 hours. Weakness progressing to the right leg. Subsequent dense right side hemiplegia. EXAM: MRI HEAD WITHOUT CONTRAST MRA HEAD WITHOUT CONTRAST MRA NECK WITHOUT AND WITH CONTRAST TECHNIQUE: Multiplanar, multiecho pulse sequences of the brain and surrounding structures were obtained without intravenous contrast. Angiographic images of the Circle of Willis were obtained using MRA technique without intravenous contrast. Angiographic images of the neck were obtained using MRA technique without and with intravenous contrast. Carotid stenosis measurements (when applicable) are obtained utilizing NASCET criteria, using the distal internal carotid diameter as the denominator. CONTRAST:  71mL MULTIHANCE GADOBENATE DIMEGLUMINE 529 MG/ML IV SOLN COMPARISON:  Head CT without contrast 0323 hours today. FINDINGS: MRI HEAD FINDINGS Brain: Patchy acute areas of restricted diffusion in the left hemisphere including the left superior frontal gyrus, motor strip, sensory strip, pre motor area, left centrum semiovale, and also in the left superior occipital lobe and occipital pole cortex. No contralateral right hemisphere or posterior fossa restricted diffusion identified. T2 and FLAIR hyperintensity in the affected areas with no  associated hemorrhage or mass effect. Underlying inferior left parietal lobe cortical encephalomalacia and hemosiderin (series 14, image 15). Patchy and confluent bilateral superimposed cerebral white matter T2 and FLAIR hyperintensity. Small chronic microhemorrhage also in the right occipital lobe white matter. 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  Vascular: Absent flow void in the distal right vertebral artery, preserved distal left vertebral artery and basilar flow void. ICA siphon flow voids are preserved. Cavum septum pellucidum anatomic variant. No ventriculomegaly. No midline shift. Skull and upper cervical spine: Negative. Sinuses/Orbits: Negative aside from mild mucosal thickening and/or fluid in the left sphenoid sinus. Other: Trace right mastoid fluid. Visible internal auditory structures appear normal. Negative scalp soft tissues. MRA NECK FINDINGS Precontrast time-of-flight images demonstrate asymmetrically decreased antegrade flow signal in the cervical left ICA. Bilateral CCA flow signal appears fairly symmetric. Preserved right ICA flow signal. Flow signal in the left vertebral artery which appears dominant in the neck. Post-contrast MRA images reveal a radiographic string sign stenosis at the left ICA origin, and highly irregular contour of the left ICA bulb with additional mild stenosis at the distal bulb level. See series 26, image 52 and series 110, image 16. Despite these findings there is preserved enhancement of the left ICA to the skullbase and into the left ICA siphon. Only mild irregularity of the right CCA and cervical right ICA. Proximal subclavian arteries are patent but irregular. The right vertebral artery is occluded from its origin to the V3 segment with only faint enhancement in the V4 segment and at the right PICA 0 which may be retrograde. There is mild stenosis at the left vertebral artery origin. The left vertebral artery is otherwise normal to the  vertebrobasilar junction. MRA HEAD FINDINGS Absent antegrade flow in the distal right vertebral artery. Preserved flow in the distal left vertebral artery and basilar. SCA and right PCA origins are patent. The left P1 segment is irregular and stenotic. Left posterior communicating artery appears to be the primary source of left PCA flow the left P2 segment is normal. Left P3 branches are attenuated. Right PCA branches have a more normal appearance. Right posterior communicating artery is diminutive or absent. Antegrade flow in the right ICA siphon to the right terminus with no stenosis. Patent right MCA and ACA origins. Decreased antegrade flow signal in the vertical petrous and proximal cavernous segment of the left ICA siphon (series 104, image 6), but the supraclinoid segment and left ICA to sinus are patent. Left MCA origin and M1 segment are patent with only mild irregularity. Left MCA bifurcation is patent. No left MCA branch occlusion is identified. The left ACA origin is severely stenotic. Patent anterior communicating artery. A2 segments appear symmetric and within normal limits. Moderate stenosis at the right MCA origin, but visible right MCA branches are otherwise within normal limits. IMPRESSION: 1. 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 No associated hemorrhage or mass effect. 2. 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 Patent left MCA, and no left MCA branch occlusion identified. 3. Proximal left PCA P1 stenosis with left posterior communicating artery contribution to the left PCA, corresponding to the left PCA territory involvement. 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 Electronically Signed: By: Genevie Ann M.D. On: 12/23/2016 10:56    Assessment: 76 y.o. male with  acute onset right sided weakness.  MRI of the brain reviewed and shows multiple small acute infarct in the left MCA and PCA distribution as well as one punctate acute infarct in the right hemisphere.  MRA shows high grade left ICA origin stenosis, possible thrombus and proximal left  PCA stenosis, chronic right vertebral occlusion, severe left ACA stenosis.  Patient refuses any intervention or treatment of stenoses.  Etiology of infarcts likely embolic.  Stat echocardiogram ordered and shows no cardiac source of emboli with EF of 60%.  LDL 87.    Stroke Risk Factors - carotid stenosis  Plan: 1. HgbA1c 2. Statin for lipid management with target LDL<70 3. PT consult, OT consult, Speech consult 4. Frequent neuro checks 5. NPO until RN stroke swallow screen 6. Prophylactic therapy-ASA 81mg  and Plavix 75mg  daily 7. Telemetry monitoring  Case discussed with Dr. Samuella Cota, MD Neurology [phone] 12/23/2016, 7:36 PM

## 2016-12-24 ENCOUNTER — Inpatient Hospital Stay: Payer: BLUE CROSS/BLUE SHIELD

## 2016-12-24 ENCOUNTER — Ambulatory Visit: Payer: BLUE CROSS/BLUE SHIELD | Admitting: Urology

## 2016-12-24 DIAGNOSIS — Z7189 Other specified counseling: Secondary | ICD-10-CM

## 2016-12-24 DIAGNOSIS — R5383 Other fatigue: Secondary | ICD-10-CM

## 2016-12-24 DIAGNOSIS — I63412 Cerebral infarction due to embolism of left middle cerebral artery: Secondary | ICD-10-CM

## 2016-12-24 DIAGNOSIS — I639 Cerebral infarction, unspecified: Secondary | ICD-10-CM

## 2016-12-24 DIAGNOSIS — Z515 Encounter for palliative care: Secondary | ICD-10-CM

## 2016-12-24 NOTE — Progress Notes (Signed)
[facility] [place] NAME: William Kennedy    MR#:  [id number]  DATE OF BIRTH:  08-26-1940  SUBJECTIVE:  CHIEF COMPLAINT:   Chief Complaint  Patient presents with  . Altered Mental Status   - refused labs this AM, prominent left facial droop and right sided weakness - sleepy this AM  REVIEW OF SYSTEMS:  Review of Systems  Constitutional: Positive for malaise/fatigue. Negative for chills and fever.  HENT: Negative for congestion, ear discharge, hearing loss and nosebleeds.   Eyes: Negative for blurred vision and double vision.  Respiratory: Negative for cough, shortness of breath and wheezing.   Cardiovascular: Negative for chest pain, palpitations and leg swelling.  Gastrointestinal: Negative for abdominal pain, constipation, diarrhea, nausea and vomiting.  Genitourinary: Negative for dysuria.  Musculoskeletal: Positive for back pain, joint pain and myalgias.  Neurological: Positive for sensory change and focal weakness. Negative for dizziness, speech change, seizures and headaches.  Psychiatric/Behavioral: Negative for depression.    DRUG ALLERGIES:  No Known Allergies  VITALS:  Blood pressure 137/67, pulse (!) 105, temperature 98.3 F (36.8 C), resp. rate 16, height 6\' 3"  (1.905 m), weight 101.3 kg (223 lb 6.4 oz), SpO2 93 %.  PHYSICAL EXAMINATION:  Physical Exam  GENERAL:  76 y.o.-year-old Elderly patient lying in the bed, sleepy today EYES: Pupils equal, round, reactive to light and accommodation. No scleral icterus. Extraocular muscles intact.  HEENT: Head atraumatic, normocephalic. Oropharynx and nasopharynx clear.  NECK:  Supple, no jugular venous distention. No thyroid enlargement, no tenderness.  LUNGS: Normal breath sounds bilaterally, no wheezing, rales,rhonchi or crepitation. No use of accessory muscles of respiration.  CARDIOVASCULAR: S1, S2 normal. No  rubs, or gallops. 2/6 systolic murmur is present ABDOMEN: Soft,  nontender, nondistended. Bowel sounds present. No organomegaly or mass.  EXTREMITIES: No pedal edema, cyanosis, or clubbing.  On the left subscapular region on the upper back there is hyperpigmentation due to recent procedure, a drain left in noted, there is a firm indurated tender areas noted around the dressing. NEUROLOGIC: Cranial nerves II through XII are intact. Left facial droop noted. - Right upper extremity strength is 1/5, with minimal right hand grip, and right lower extremity strength is 1/5 at this time. Sensation is intact but has paresthesias on the right upper arm. Left upper and lower extremity strength that at baseline 5/5 Sensation intact. Gait not checked. PSYCHIATRIC: The patient is sleepy, arousable, following simple commands  SKIN: No obvious rash, lesion, or ulcer.    LABORATORY PANEL:   CBC  Recent Labs Lab 12/22/16 0549  WBC 15.8*  HGB 12.4*  HCT 36.0*  PLT 268   ------------------------------------------------------------------------------------------------------------------  Chemistries   Recent Labs Lab 12/18/16 1235  12/23/16 0333  NA 138  < > 141  K 3.8  < > 3.4*  CL 103  < > 107  CO2 26  < > 27  GLUCOSE 113*  < > 110*  BUN 14  < > 9  CREATININE 1.07  < > 0.80  CALCIUM 10.0  < > 9.1  AST 47*  --   --   ALT 42  --   --   ALKPHOS 101  --   --   BILITOT 0.7  --   --   < > = values in this interval not displayed. ------------------------------------------------------------------------------------------------------------------  Cardiac Enzymes  Recent Labs Lab 12/18/16 1235  TROPONINI <0.03   ------------------------------------------------------------------------------------------------------------------  RADIOLOGY:  Ct Head Wo Contrast  Result  Date: 12/23/2016 CLINICAL DATA:  New onset right arm weakness. EXAM: CT HEAD WITHOUT CONTRAST TECHNIQUE: Contiguous axial images were obtained from the base of the skull through the vertex  without intravenous contrast. COMPARISON:  11/30/2016 FINDINGS: Brain: Diffuse cerebral atrophy. Mild ventricular dilatation consistent with central atrophy. Low-attenuation changes in the deep white matter consistent small vessel ischemia. Cavum septum pellucidum. Small area of old encephalomalacia in the left posterior parietal lobe unchanged since previous study. No evidence of acute infarction, hemorrhage, hydrocephalus, extra-axial collection or mass lesion/mass effect. Vascular: Vascular calcifications are present. Skull: No depressed skull fractures. Sinuses/Orbits: Paranasal sinuses and mastoid air cells are clear. Other: No change since previous study. IMPRESSION: No acute intracranial abnormalities. Chronic atrophy and small vessel ischemic changes. 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 No change since previous studies. Electronically Signed   By: Lucienne Capers M.D.   On: 12/23/2016 03:35   Mr [name] Head Wo Contrast  Addendum Date: 12/23/2016   ADDENDUM REPORT: 12/23/2016 11:20 ADDENDUM: Study discussed by telephone with Dr. Gladstone Lighter on 12/23/2016 at 1113 hours. Electronically Signed   By: Genevie Ann M.D.   On: 12/23/2016 11:20   Result Date: 12/23/2016 CLINICAL DATA:  76 year old male recently treated for sepsis. New onset right upper extremity weakness upon waking at 0300 hours. Weakness progressing to the right leg. Subsequent dense right side hemiplegia. EXAM: MRI HEAD WITHOUT CONTRAST MRA HEAD WITHOUT CONTRAST MRA NECK WITHOUT AND WITH CONTRAST TECHNIQUE: Multiplanar, multiecho pulse sequences of the brain and surrounding structures were obtained without intravenous contrast. Angiographic images of the Circle of Willis were obtained using MRA technique without intravenous contrast. Angiographic images of the neck were obtained using MRA technique without and with intravenous contrast. Carotid stenosis measurements (when applicable) are obtained utilizing NASCET  criteria, using the distal internal carotid diameter as the denominator. CONTRAST:  24mL MULTIHANCE GADOBENATE DIMEGLUMINE 529 MG/ML IV SOLN COMPARISON:  Head CT without contrast 0323 hours today. FINDINGS: MRI HEAD FINDINGS Brain: Patchy acute areas of restricted diffusion in the left hemisphere including the left superior frontal gyrus, motor strip, sensory strip, pre motor area, left centrum semiovale, and also in the left superior occipital lobe and occipital pole cortex. No contralateral right hemisphere or posterior fossa restricted diffusion identified. T2 and FLAIR hyperintensity in the affected areas with no associated hemorrhage or mass effect. Underlying inferior left parietal lobe cortical encephalomalacia and hemosiderin (series 14, image 15). Patchy and confluent bilateral superimposed cerebral white matter T2 and FLAIR hyperintensity. Small chronic microhemorrhage also in the right occipital lobe white matter. 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 Vascular: Absent flow void in the distal right vertebral artery, preserved distal left vertebral artery and basilar flow void. ICA siphon flow voids are preserved. Cavum septum pellucidum anatomic variant. No ventriculomegaly. No midline shift. Skull and upper cervical spine: Negative. Sinuses/Orbits: Negative aside from mild mucosal thickening and/or fluid in the left sphenoid sinus. Other: Trace right mastoid fluid. Visible internal auditory structures appear normal. Negative scalp soft tissues. MRA NECK FINDINGS Precontrast time-of-flight images demonstrate asymmetrically decreased antegrade flow signal in the cervical left ICA. Bilateral CCA flow signal appears fairly symmetric. Preserved right ICA flow signal. Flow signal in the left vertebral artery which appears dominant in the neck. Post-contrast MRA images reveal a radiographic string sign stenosis at the left ICA origin, and highly irregular contour of the left ICA  bulb with additional mild stenosis at the distal bulb level. See series 26,  image 52 and series 110, image 16. Despite these findings there is preserved enhancement of the left ICA to the skullbase and into the left ICA siphon. Only mild irregularity of the right CCA and cervical right ICA. Proximal subclavian arteries are patent but irregular. The right vertebral artery is occluded from its origin to the V3 segment with only faint enhancement in the V4 segment and at the right PICA 0 which may be retrograde. There is mild stenosis at the left vertebral artery origin. The left vertebral artery is otherwise normal to the vertebrobasilar junction. MRA HEAD FINDINGS Absent antegrade flow in the distal right vertebral artery. Preserved flow in the distal left vertebral artery and basilar. SCA and right PCA origins are patent. The left P1 segment is irregular and stenotic. Left posterior communicating artery appears to be the primary source of left PCA flow the left P2 segment is normal. Left P3 branches are attenuated. Right PCA branches have a more normal appearance. Right posterior communicating artery is diminutive or absent. Antegrade flow in the right ICA siphon to the right terminus with no stenosis. Patent right MCA and ACA origins. Decreased antegrade flow signal in the vertical petrous and proximal cavernous segment of the left ICA siphon (series 104, image 6), but the supraclinoid segment and left ICA to sinus are patent. Left MCA origin and M1 segment are patent with only mild irregularity. Left MCA bifurcation is patent. No left MCA branch occlusion is identified. The left ACA origin is severely stenotic. Patent anterior communicating artery. A2 segments appear symmetric and within normal limits. Moderate stenosis at the right MCA origin, but visible right MCA branches are otherwise within normal limits. IMPRESSION: 1. 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 No associated hemorrhage or  mass effect. 2. 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 Patent left MCA, and no left MCA branch occlusion identified. 3. Proximal left PCA P1 stenosis with left posterior communicating artery contribution to the left PCA, corresponding to the left PCA territory involvement. 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 Electronically Signed: By: Genevie Ann M.D. On: 12/23/2016 10:56   Mr [name] Neck W Wo Contrast  Addendum Date: 12/23/2016   ADDENDUM REPORT: 12/23/2016 11:20 ADDENDUM: Study discussed by telephone with Dr. Gladstone Lighter on 12/23/2016 at 1113 hours. Electronically Signed   By: Genevie Ann M.D.   On: 12/23/2016 11:20   Result Date: 12/23/2016 CLINICAL DATA:  76 year old male recently treated for sepsis. New onset right upper extremity weakness upon waking at 0300 hours. Weakness progressing to the right leg. Subsequent dense right side hemiplegia. EXAM: MRI HEAD WITHOUT CONTRAST MRA HEAD WITHOUT CONTRAST MRA NECK WITHOUT AND WITH CONTRAST TECHNIQUE: Multiplanar, multiecho pulse sequences of the brain and surrounding structures were obtained without intravenous contrast. Angiographic images of the Circle of Willis were obtained using MRA technique without intravenous contrast. Angiographic images of the neck were obtained using MRA technique without and with intravenous contrast. Carotid stenosis measurements (when applicable) are obtained utilizing NASCET criteria, using the distal internal carotid diameter as the denominator. CONTRAST:  64mL MULTIHANCE GADOBENATE DIMEGLUMINE 529 MG/ML IV SOLN COMPARISON:  Head CT without contrast 0323 hours today. FINDINGS: MRI HEAD FINDINGS Brain: Patchy acute areas of restricted diffusion in the left hemisphere including the left superior frontal gyrus, motor strip,  sensory strip, pre motor area, left centrum semiovale, and also in the left superior occipital lobe and occipital pole cortex. No contralateral right hemisphere or posterior fossa restricted diffusion identified. T2 and FLAIR hyperintensity in the affected areas with no associated hemorrhage or mass effect. Underlying inferior left parietal lobe cortical encephalomalacia and hemosiderin (series 14, image 15). Patchy and confluent bilateral superimposed cerebral white matter T2 and FLAIR hyperintensity. Small chronic microhemorrhage also in the right occipital lobe white matter. 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 Vascular: Absent flow void in the distal right vertebral artery, preserved distal left vertebral artery and basilar flow void. ICA siphon flow voids are preserved. Cavum septum pellucidum anatomic variant. No ventriculomegaly. No midline shift. Skull and upper cervical spine: Negative. Sinuses/Orbits: Negative aside from mild mucosal thickening and/or fluid in the left sphenoid sinus. Other: Trace right mastoid fluid. Visible internal auditory structures appear normal. Negative scalp soft tissues. MRA NECK FINDINGS Precontrast time-of-flight images demonstrate asymmetrically decreased antegrade flow signal in the cervical left ICA. Bilateral CCA flow signal appears fairly symmetric. Preserved right ICA flow signal. Flow signal in the left vertebral artery which appears dominant in the neck. Post-contrast MRA images reveal a radiographic string sign stenosis at the left ICA origin, and highly irregular contour of the left ICA bulb with additional mild stenosis at the distal bulb level. See series 26, image 52 and series 110, image 16. Despite these findings there is preserved enhancement of the left ICA to the skullbase and into the left ICA siphon. Only mild irregularity of the right CCA and cervical right ICA. Proximal subclavian arteries are patent but irregular. The right  vertebral artery is occluded from its origin to the V3 segment with only faint enhancement in the V4 segment and at the right PICA 0 which may be retrograde. There is mild stenosis at the left vertebral artery origin. The left vertebral artery is otherwise normal to the vertebrobasilar junction. MRA HEAD FINDINGS Absent antegrade flow in the distal right vertebral artery. Preserved flow in the distal left vertebral artery and basilar. SCA and right PCA origins are patent. The left P1 segment is irregular and stenotic. Left posterior communicating artery appears to be the primary source of left PCA flow the left P2 segment is normal. Left P3 branches are attenuated. Right PCA branches have a more normal appearance. Right posterior communicating artery is diminutive or absent. Antegrade flow in the right ICA siphon to the right terminus with no stenosis. Patent right MCA and ACA origins. Decreased antegrade flow signal in the vertical petrous and proximal cavernous segment of the left ICA siphon (series 104, image 6), but the supraclinoid segment and left ICA to sinus are patent. Left MCA origin and M1 segment are patent with only mild irregularity. Left MCA bifurcation is patent. No left MCA branch occlusion is identified. The left ACA origin is severely stenotic. Patent anterior communicating artery. A2 segments appear symmetric and within normal limits. Moderate stenosis at the right MCA origin, but visible right MCA branches are otherwise within normal limits. IMPRESSION: 1. 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 No associated hemorrhage or mass effect. 2. 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 Patent left MCA, and no left MCA branch occlusion identified. 3. Proximal left PCA P1 stenosis with left posterior communicating artery contribution to the left PCA, corresponding to  the left PCA territory involvement. 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 Electronically Signed: By: Genevie Ann M.D. On: 12/23/2016 10:56   Mr Brain Wo Contrast  Addendum Date: 12/23/2016   ADDENDUM REPORT: 12/23/2016 11:20 ADDENDUM: Study discussed by telephone with Dr. Gladstone Lighter on 12/23/2016 at 1113 hours. Electronically Signed   By: Genevie Ann M.D.   On: 12/23/2016 11:20   Result Date: 12/23/2016 CLINICAL DATA:  76 year old male recently treated for sepsis. New onset right upper extremity weakness upon waking at 0300 hours. Weakness progressing to the right leg. Subsequent dense right side hemiplegia. EXAM: MRI HEAD WITHOUT CONTRAST MRA HEAD WITHOUT CONTRAST MRA NECK WITHOUT AND WITH CONTRAST TECHNIQUE: Multiplanar, multiecho pulse sequences of the brain and surrounding structures were obtained without intravenous contrast. Angiographic images of the Circle of Willis were obtained using MRA technique without intravenous contrast. Angiographic images of the neck were obtained using MRA technique without and with intravenous contrast. Carotid stenosis measurements (when applicable) are obtained utilizing NASCET criteria, using the distal internal carotid diameter as the denominator. CONTRAST:  40mL MULTIHANCE GADOBENATE DIMEGLUMINE 529 MG/ML IV SOLN COMPARISON:  Head CT without contrast 0323 hours today. FINDINGS: MRI HEAD FINDINGS Brain: Patchy acute areas of restricted diffusion in the left hemisphere including the left superior frontal gyrus, motor strip, sensory strip, pre motor area, left centrum semiovale, and also in the left superior occipital lobe and occipital pole cortex. No contralateral right hemisphere or posterior fossa restricted diffusion identified. T2 and FLAIR hyperintensity in the affected areas with no associated hemorrhage or mass effect. Underlying inferior left parietal lobe cortical  encephalomalacia and hemosiderin (series 14, image 15). Patchy and confluent bilateral superimposed cerebral white matter T2 and FLAIR hyperintensity. Small chronic microhemorrhage also in the right occipital lobe white matter. 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 Vascular: Absent flow void in the distal right vertebral artery, preserved distal left vertebral artery and basilar flow void. ICA siphon flow voids are preserved. Cavum septum pellucidum anatomic variant. No ventriculomegaly. No midline shift. Skull and upper cervical spine: Negative. Sinuses/Orbits: Negative aside from mild mucosal thickening and/or fluid in the left sphenoid sinus. Other: Trace right mastoid fluid. Visible internal auditory structures appear normal. Negative scalp soft tissues. MRA NECK FINDINGS Precontrast time-of-flight images demonstrate asymmetrically decreased antegrade flow signal in the cervical left ICA. Bilateral CCA flow signal appears fairly symmetric. Preserved right ICA flow signal. Flow signal in the left vertebral artery which appears dominant in the neck. Post-contrast MRA images reveal a radiographic string sign stenosis at the left ICA origin, and highly irregular contour of the left ICA bulb with additional mild stenosis at the distal bulb level. See series 26, image 52 and series 110, image 16. Despite these findings there is preserved enhancement of the left ICA to the skullbase and into the left ICA siphon. Only mild irregularity of the right CCA and cervical right ICA. Proximal subclavian arteries are patent but irregular. The right vertebral artery is occluded from its origin to the V3 segment with only faint enhancement in the V4 segment and at the right PICA 0 which may be retrograde. There is mild stenosis at the left vertebral artery origin. The left vertebral artery is otherwise normal to the vertebrobasilar junction. MRA HEAD FINDINGS Absent antegrade flow in the distal right  vertebral artery. Preserved flow in the distal left vertebral artery and basilar. SCA and right PCA origins  are patent. The left P1 segment is irregular and stenotic. Left posterior communicating artery appears to be the primary source of left PCA flow the left P2 segment is normal. Left P3 branches are attenuated. Right PCA branches have a more normal appearance. Right posterior communicating artery is diminutive or absent. Antegrade flow in the right ICA siphon to the right terminus with no stenosis. Patent right MCA and ACA origins. Decreased antegrade flow signal in the vertical petrous and proximal cavernous segment of the left ICA siphon (series 104, image 6), but the supraclinoid segment and left ICA to sinus are patent. Left MCA origin and M1 segment are patent with only mild irregularity. Left MCA bifurcation is patent. No left MCA branch occlusion is identified. The left ACA origin is severely stenotic. Patent anterior communicating artery. A2 segments appear symmetric and within normal limits. Moderate stenosis at the right MCA origin, but visible right MCA branches are otherwise within normal limits. IMPRESSION: 1. 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 No associated hemorrhage or mass effect. 2. 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 Patent left MCA, and no left MCA branch occlusion identified. 3. Proximal left PCA P1 stenosis with left posterior communicating artery contribution to the left PCA, corresponding to the left PCA territory involvement. 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 Electronically Signed: By: Genevie Ann M.D. On: 12/23/2016 10:56    EKG:   Orders placed or performed during the hospital encounter of 12/18/16  . ED EKG 12-Lead  . ED EKG 12-Lead  .  EKG 12-Lead  . EKG 12-Lead  . EKG  . EKG 12-Lead  . EKG 12-Lead  . EKG 12-Lead  . EKG 12-Lead    ASSESSMENT AND PLAN:   76 year old male with past medical history significant for CLL, hypertension, BPH with chronic indwelling Foley catheter, subacute left upper back abscess presents to hospital secondary to altered mental status.  #1 acute left hemispheric CVA - MRI of the brain revealing scattered acute infarcts in left MCA and PCA territory. -MRI is positive for high-grade string sign stenosis at the left ICA origin and severe stenosis or thrombosis in the mid left ICA siphon. Chronic left PCA infarct noted, chronic right vertebral artery occlusion and severe stenosis of left ACA origin -Discussed with patient and wife about transferring to [facility] for consideration of either thrombectomy, however patient refused yesterday. -Continue neuro checks, on aspirin and Plavix and statin -Echo did not reveal any source of thrombus. -Still high risk for worsening left ICA occlusion resulting in complete left hemispheric infarct would ultimately lead to cerebral edema. The risks explained to both patient and wife at bedside -Appreciate neurology consult  #2 sepsis-secondary to left upper back abscess, status post lancing in May 2018 for the first time  -Status post incision and drainage again this admission, cultures are negative so far. On IV Ancef at this time. -Appreciate ID consult. Still has indurated area surrounding the drained region, continue to monitor. -Has a Penrose drain in for more drainage -Continue pain medications as tolerated -Change to oral antibiotics at discharge  #3 BPH and chronic Foley catheter-no evidence of infection. Has chronic conization. -Denies any bladder spasms at this time. -Continue Flomax and finasteride  #4 CLL-white count is actually improving. Continue to monitor  #5  DVT prophylaxis-on subcutaneous heparin  Will need rehabilitation now  with right-sided weakness. -Physical therapy and occupational therapy consults pending     All the records are reviewed and case discussed with Care Management/Social Workerr. Management plans discussed with the patient, family and they are in agreement.  CODE STATUS: Full code  TOTAL TIME TAKING CARE OF THIS PATIENT: 38 minutes.   POSSIBLE D/C IN 2-3 DAYS, DEPENDING ON CLINICAL CONDITION.   Neema Barreira M.D on 12/24/2016 at 9:29 AM  Between 7am to 6pm - Pager - [phone]  After 6pm go to [email or web address] - password [email or web address] [facility]  Office  [phone]  CC: Primary care physician; [name], MD

## 2016-12-24 NOTE — Evaluation (Signed)
Occupational Therapy Evaluation Patient Details Name: William Kennedy MRN: [id number] DOB: Mar 05, 1941 Today's Date: 12/24/2016    History of Present Illness Pt. is a 76 y.o. male who was admitted to [facility] with Sepsis, back/shoulder/abscess with recent drainage, Encephalpathy, Leukemia, Urine retention/UTI. Pt. 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 Pt. is refusing any intervention of the stenoses.   Clinical Impression   Pt. Is a 76 y.o. who presents with dense right sided hemiplegia. Pt. was able to actively elevate his RUE to wipe his forehead with increased time. Pt. presented with no active wrist, or digit movement. Pt. was unable to elevate his RUE actively upon command. Pt. presents with 8/10 pain with PROM in his right shoulder limiting tolerance for ROM today. Pt. Was able to tolerate PROM to wrist, and digits. Pt./wife education was provided about PROM to the right hand, and positioning. Education was provided verbally and through visual demonstration. Pt. wife expressed desire to learn how to do ROM, however reported being too tired from not sleeping much last night. Pt. reports blurred vision on the right side. Vision to be assessed in functional context. Cognition is limiting assessment this date. Pt. Will benefit from skilled OT services to provide education, and review ROM, positioning, and to improve RUE functioning for improved use during basic ADL/self-care. Pt. Would benefit from SNF level of care with follow-up OT services.    Follow Up Recommendations  SNF    Equipment Recommendations       Recommendations for Other Services       Precautions / Restrictions                                                      ADL either performed or assessed with clinical judgement   ADL  Overall ADL's : Needs assistance/impaired Eating/Feeding: Minimal assistance   Grooming: Modified independent   Upper Body Bathing: Total assistance   Lower Body Bathing: Total assistance   Upper Body Dressing : Total assistance   Lower Body Dressing: Total assistance               Functional mobility during ADLs: Total assistance       Vision Baseline Vision/History: Wears glasses Wears Glasses: At all times Patient Visual Report: Blurring of vision Vision Assessment?: Vision impaired- to be further tested in functional context Additional Comments: Cognition limiting assessment vision assessment.     Perception     Praxis      Pertinent Vitals/Pain Pain Assessment: 0-10 Pain Score: 8  Faces Pain Scale: No hurt ("no, not really"; pt usually has some back discomfort) Pain Location: Right shoulder at end ROM. Pain Intervention(s): Limited activity within patient's tolerance     Hand Dominance Right   Extremity/Trunk Assessment Upper Extremity Assessment Upper Extremity Assessment: RUE deficits/detail RUE Deficits / Details: Pt. was able to actively raise RUE to wip his forhead spontaneously, volitionally, however was unable to actively move it upon command.  No active right wrist or digit movement elicited. Limited tolerance for gentle PROM in the right shoulder secondary to 8/10 pain. Pt. with edema in the right hand.    Lower Extremity Assessment Lower Extremity Assessment: Generalized  weakness       Communication Communication Communication: No difficulties   Cognition Arousal/Alertness: Awake/alert (fatigued) Behavior During Therapy: Restless Overall Cognitive Status: No family/caregiver present to determine baseline cognitive functioning Area of Impairment: Safety/judgement;Following commands                       Following Commands: Follows one step commands with increased time Safety/Judgement: Decreased awareness of safety;Decreased  awareness of deficits         General Comments       Exercises     Shoulder Instructions      Home Living Family/patient expects to be discharged to:: Private residence Living Arrangements: Spouse/significant other Available Help at Discharge: Family;Available 24 hours/day Type of Home: House Home Access: Stairs to enter [employer] of Steps: 2 Entrance Stairs-Rails: Right;Left;Can reach both Home Layout: One level         Biochemist, clinical: Standard     Home Equipment: Environmental consultant - 2 wheels;Cane - single point;Hand held shower head;Shower seat          Prior Functioning/Environment Level of Independence: Needs assistance    ADL's / Homemaking Assistance Needed: Pt. was initially independent with ADLs. pt. had a progressive decline secondary recent illness, and hospitalization. Pt. has been completely bedbound prior to this most recent admission, with all daily self-care provided at bed level pt. wife.            OT Problem List: Decreased strength;Decreased range of motion;Decreased activity tolerance;Pain;Impaired UE functional use;Decreased knowledge of use of DME or AE;Decreased safety awareness;Decreased cognition;Impaired balance (sitting and/or standing);Impaired vision/perception      OT Treatment/Interventions: Self-care/ADL training;Therapeutic exercise;Neuromuscular education;Patient/family education;DME and/or AE instruction;Therapeutic activities;Energy conservation;Balance training;Visual/perceptual remediation/compensation;Cognitive remediation/compensation    OT Goals(Current goals can be found in the care plan section) Acute Rehab OT Goals Patient Stated Goal: Pt. be more independent OT Goal Formulation: With patient Potential to Achieve Goals: Good  OT Frequency: Min 2X/week   Barriers to D/C:            Co-evaluation              AM-PAC PT "6 Clicks" Daily Activity     Outcome Measure Help from another person eating meals?: A  Lot Help from another person taking care of personal grooming?: A Lot Help from another person toileting, which includes using toliet, bedpan, or urinal?: Total Help from another person bathing (including washing, rinsing, drying)?: Total Help from another person to put on and taking off regular upper body clothing?: Total Help from another person to put on and taking off regular lower body clothing?: Total 6 Click Score: 8   End of Session    Activity Tolerance: Patient limited by pain;Patient limited by fatigue (cognition) Patient left: in bed;with call bell/phone within reach;with bed alarm set;with family/visitor present  OT Visit Diagnosis: Muscle weakness (generalized) (M62.81)                Time: 0258-5277 OT Time Calculation (min): 23 min Charges:  OT General Charges $OT Visit: 1 Procedure OT Evaluation $OT Eval High Complexity: 1 Procedure G-Codes:     Harrel Carina, MS, OTR/L   Harrel Carina, MS, OTR/L 12/24/2016, 4:32 PM

## 2016-12-24 NOTE — Progress Notes (Signed)
Speech Language Pathology Treatment: Dysphagia  Patient Details Name: William Kennedy MRN: [id number] DOB: Nov 04, 1940 Today's Date: 12/24/2016 Time: 1335-1400 SLP Time Calculation (min) (ACUTE ONLY): 25 min  Assessment / Plan / Recommendation Clinical Impression  Pt was seen today for toleration of po's secondary to report that pt seemed min more confused today s/p recent CVA. NSG reported pt was "holding" his liquids when drinking. Pt continues to appear to present w/ adequate oropharyngeal phase swallow function w/ trials of thin liquids - no trials of solids assessed d/t pt decline. No immediate, overt s/s of aspiration occurred w/ liquid trials during this tx session; clear vocal quality noted b/t trials. Pt was fed trials of thin liquids via straw(pt assisted by holding cup/straw) exhibiting adequate oral phase bolus control and oral clearing b/t trials - though pt did exhibit swishing of liquids x2/8 trials w/ min increased oral phase time d/t the swishing; noted min impulsive larger sips when drinking despite verbal cues. Pt required feeding support d/t RUE weakness and encouragement to sit more upright for any po intake. Pt does suffer from sore back area d/t abscess but complied and was able to benefit from a towel roll behind head to support more forward for oral intake. Recommend continue a dysphagia level 3 diet w/ some added comdiments to moisten foods; thin liquids - monitoring w/ ALL oral intake. Recommend general aspiration precautions and encouragement to sit more upright during eating/drinking; recommend pills given in puree - if needed for easier swallowing. Recommend feeding support at all meals. ST services will be available for reconsult if any change in status while admitted. NSG updated.    HPI HPI: Pt is a 76 y.o. male with a known history of Sepsis, Encephalopathy, back abscess w/ recent drainage, leukemia and urine retention/UTI. The patient was discharged about 2 weeks ago  after treatment of sepsis due to back abscess. He was discharged with antibiotics IV via PICC line. He was fine until 2 days ago, he was found confused. In the ED, he was found septic and given antibiotics. Since admission, pt has had drainage of his back abscess for the second time yesterday, still has significant pain around it; some confusion noted as well. During the night, NSG reported a change in status revealing Speech is clear, no trouble swallowing, but has dense right-sided hemiplegia of U/LEs. 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 MD discussed with patient and wife and considering transfer to [facility] to see if IR can do any thrombolysis/thrombectomy of left MCA. Patient refused transfer; MD encouraged wife and family to talk w/ him about transfer for further management. Pt is verbally conversive but w/ inconsistent hesitation at times; speech is clear; pt is able to follow general instructions w/ only few cues at eval. Pt appears min more confused today in his cognitive-linguistic skills but given time, he responded to basic questions re: self and desire for oral intake, options. Neurologist to f/u w/ pt's status to determine any new change in status neurologically s/p recent CVA.       SLP Plan  Continue with current plan of care       Recommendations  Diet recommendations: Dysphagia 3 (mechanical soft);Thin liquid Liquids provided via: Cup;Straw Medication Administration: Whole meds with puree Supervision: Staff to assist with self feeding;Full supervision/cueing for compensatory strategies Compensations: Minimize environmental distractions;Slow rate;Small sips/bites;Lingual sweep for clearance of pocketing;Multiple  dry swallows after each bite/sip;Follow solids with liquid (encourage no swishing of liquids) Postural Changes and/or Swallow Maneuvers:  Seated upright 90 degrees;Upright 30-60 min after meal                General recommendations:  (Dietician f/u) Oral Care Recommendations: Oral care BID;Staff/trained caregiver to provide oral care Follow up Recommendations:  (TBD) SLP Visit Diagnosis: Dysphagia, oropharyngeal phase (R13.12) Plan: Continue with current plan of care       [place], [place], CCC-SLP Annalisse Minkoff 12/24/2016, 2:58 PM

## 2016-12-24 NOTE — Progress Notes (Signed)
Palliative Medicine consult noted. Due to high referral volume, there may be a delay seeing this patient. Please call the Palliative Medicine Team office at [phone] if recommendations are needed in the interim.  Thank you for inviting [place] to see this patient.  Marjie Skiff Rocio Wolak, RN, BSN, [facility] 12/24/2016 12:13 PM Cell [phone] 8:00-4:00 Monday-Friday Office [phone]

## 2016-12-24 NOTE — Progress Notes (Signed)
[place] INFECTIOUS DISEASE PROGRESS NOTE Date of Admission:  12/18/2016     ID: William Kennedy is a 76 y.o. male with MSSA back abscess Active Problems:   Sepsis ([place])   Cellulitis   Urinary tract infection without hematuria   Subjective: Yest had weakness- MRI showed embolic CVA No fevers  ROS  Unable to obtain   Medications:  Antibiotics Given (last 72 hours)    Date/Time Action Medication Dose Rate   12/21/16 1513 New Bag/Given   ceFAZolin (ANCEF) IVPB 2g/100 mL premix 2 g 200 mL/hr   12/21/16 2137 New Bag/Given   ceFAZolin (ANCEF) IVPB 2g/100 mL premix 2 g 200 mL/hr   12/22/16 0630 New Bag/Given   ceFAZolin (ANCEF) IVPB 2g/100 mL premix 2 g 200 mL/hr   12/22/16 1337 New Bag/Given   ceFAZolin (ANCEF) IVPB 2g/100 mL premix 2 g 200 mL/hr   12/22/16 2313 New Bag/Given   ceFAZolin (ANCEF) IVPB 2g/100 mL premix 2 g 200 mL/hr   12/23/16 0655 New Bag/Given   ceFAZolin (ANCEF) IVPB 2g/100 mL premix 2 g 200 mL/hr   12/23/16 1531 New Bag/Given   ceFAZolin (ANCEF) IVPB 2g/100 mL premix 2 g 200 mL/hr   12/23/16 2204 New Bag/Given   ceFAZolin (ANCEF) IVPB 2g/100 mL premix 2 g 200 mL/hr   12/24/16 0654 New Bag/Given   ceFAZolin (ANCEF) IVPB 2g/100 mL premix 2 g 200 mL/hr     . aspirin  81 mg Oral Daily  . atorvastatin  40 mg Oral q1800  . clopidogrel  75 mg Oral Daily  . enoxaparin (LOVENOX) injection  40 mg Subcutaneous Q24H  . feeding supplement (ENSURE ENLIVE)  237 mL Oral TID BM  . finasteride  5 mg Oral Daily  . lidocaine  1 patch Transdermal Q24H  . lidocaine-EPINEPHrine  30 mL Intradermal Once  . liver oil-zinc oxide   Topical TID  . metoprolol tartrate  12.5 mg Oral BID  . tamsulosin  0.4 mg Oral Daily    Objective: Vital signs in last 24 hours: Temp:  [98.3 F (36.8 C)-98.7 F (37.1 C)] 98.7 F (37.1 C) (08/03 0800) Pulse Rate:  [99-105] 99 (08/03 0800) Resp:  [16-18] 18 (08/03 0800) BP: (125-137)/(65-67) 133/65 (08/03 0800) SpO2:  [93 %-95 %] 94 %  (08/03 0800) Constitutional: He is disoriented, calm , lying in bed  Obese, disheveled. HENT: anicteric Mouth/Throat: Oropharynx is clear and dry . No oropharyngeal exudate.  Cardiovascular: Normal rate, regular rhythm and normal heart sounds Pulmonary/Chest: Effort normal and breath sounds normal. No respiratory distress. He has no wheezes.  Abdominal: obese,  nontender PICC LUE wnl Lymphadenopathy: He has no cervical adenopathy.  Neurological: He is confused   Skin: L upper back with drain in place Psychiatric: calm  Lab Results  Recent Labs  12/22/16 0549 12/23/16 0333  WBC 15.8*  --   HGB 12.4*  --   HCT 36.0*  --   NA  --  141  K  --  3.4*  CL  --  107  CO2  --  27  BUN  --  9  CREATININE  --  0.80    Microbiology: Results for orders placed or performed during the hospital encounter of 12/18/16  Culture, blood (Routine x 2)     Status: None   Collection Time: 12/18/16 12:35 PM  Result Value Ref Range Status   Specimen Description BLOOD RIGHT ANTECUBITAL  Final   Special Requests   Final    BOTTLES DRAWN AEROBIC  AND ANAEROBIC Blood Culture adequate volume   Culture NO GROWTH 5 DAYS  Final   Report Status 12/23/2016 FINAL  Final  Culture, blood (Routine x 2)     Status: None   Collection Time: 12/18/16 12:35 PM  Result Value Ref Range Status   Specimen Description BLOOD BLOOD LEFT WRIST  Final   Special Requests   Final    BOTTLES DRAWN AEROBIC AND ANAEROBIC Blood Culture adequate volume   Culture NO GROWTH 5 DAYS  Final   Report Status 12/23/2016 FINAL  Final  Urine culture     Status: None   Collection Time: 12/18/16 12:35 PM  Result Value Ref Range Status   Specimen Description URINE, RANDOM  Final   Special Requests NONE  Final   Culture   Final    NO GROWTH Performed at [facility], [place] [address], [place], [place] [address]    Report Status 12/19/2016 FINAL  Final  C difficile quick scan w PCR reflex     Status: None   Collection Time:  12/18/16  6:02 PM  Result Value Ref Range Status   C Diff antigen NEGATIVE NEGATIVE Final   C Diff toxin NEGATIVE NEGATIVE Final   C Diff interpretation No C. difficile detected.  Final  MRSA PCR Screening     Status: None   Collection Time: 12/19/16 11:24 AM  Result Value Ref Range Status   MRSA by PCR NEGATIVE NEGATIVE Final    Comment:        The GeneXpert MRSA Assay (FDA approved for NASAL specimens only), is one component of a comprehensive MRSA colonization surveillance program. It is not intended to diagnose MRSA infection nor to guide or monitor treatment for MRSA infections.   Aerobic/Anaerobic Culture (surgical/deep wound)     Status: None (Preliminary result)   Collection Time: 12/21/16 12:28 PM  Result Value Ref Range Status   Specimen Description BACK  Final   Special Requests Immunocompromised  Final   Gram Stain   Final    ABUNDANT WBC PRESENT, PREDOMINANTLY PMN NO ORGANISMS SEEN    Culture   Final    NO GROWTH 3 DAYS NO ANAEROBES ISOLATED; CULTURE IN PROGRESS FOR 5 DAYS Performed at [facility], [address] [place], [place] [address]    Report Status PENDING  Incomplete     Studies/Results: Ct Head Wo Contrast  Result Date: 12/24/2016 CLINICAL DATA:  76 year old male with left facial droop, right arm and leg weakness. Left MCA and PCA territory infarcts suspected on brain MRI yesterday. EXAM: CT HEAD WITHOUT CONTRAST TECHNIQUE: Contiguous axial images were obtained from the base of the skull through the vertex without intravenous contrast. COMPARISON:  Brain MRI 12/23/2016, head CT 12/23/2016. FINDINGS: Brain: Acute on chronic left lateral occipital evolving infarct and and subtle acute superior left MCA cortical and white matter infarct corresponding to the MRI findings yesterday. No associated hemorrhage or mass effect. Minimal increased hypodensity on CT compared to 12/23/2016. Stable gray-white matter differentiation elsewhere with confluent  bilateral chronic white matter hypodensity. No intracranial mass effect. Stable ventricle size and configuration. Vascular: Calcified atherosclerosis at the skull base. Skull: No acute osseous abnormality identified. Sinuses/Orbits: stable and well pneumatized aside from bubbly opacity in the left sphenoid. Other: No acute orbit or scalp soft tissue findings. IMPRESSION: 1. 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 No associated hemorrhage or mass effect. 2. No new intracranial abnormality. Electronically  Signed   By: Genevie Ann M.D.   On: 12/24/2016 12:20   Ct Head Wo Contrast  Result Date: 12/23/2016 CLINICAL DATA:  New onset right arm weakness. EXAM: CT HEAD WITHOUT CONTRAST TECHNIQUE: Contiguous axial images were obtained from the base of the skull through the vertex without intravenous contrast. COMPARISON:  11/30/2016 FINDINGS: Brain: Diffuse cerebral atrophy. Mild ventricular dilatation consistent with central atrophy. Low-attenuation changes in the deep white matter consistent small vessel ischemia. Cavum septum pellucidum. Small area of old encephalomalacia in the left posterior parietal lobe unchanged since previous study. No evidence of acute infarction, hemorrhage, hydrocephalus, extra-axial collection or mass lesion/mass effect. Vascular: Vascular calcifications are present. Skull: No depressed skull fractures. Sinuses/Orbits: Paranasal sinuses and mastoid air cells are clear. Other: No change since previous study. IMPRESSION: No acute intracranial abnormalities. Chronic atrophy and small vessel ischemic changes. 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 No change since previous studies. Electronically Signed   By: Lucienne Capers M.D.   On: 12/23/2016 03:35   Mr [name] Head Wo Contrast  Addendum Date: 12/23/2016   ADDENDUM REPORT: 12/23/2016 11:20 ADDENDUM: Study discussed by telephone with Dr. Gladstone Lighter on 12/23/2016 at  1113 hours. Electronically Signed   By: Genevie Ann M.D.   On: 12/23/2016 11:20   Result Date: 12/23/2016 CLINICAL DATA:  76 year old male recently treated for sepsis. New onset right upper extremity weakness upon waking at 0300 hours. Weakness progressing to the right leg. Subsequent dense right side hemiplegia. EXAM: MRI HEAD WITHOUT CONTRAST MRA HEAD WITHOUT CONTRAST MRA NECK WITHOUT AND WITH CONTRAST TECHNIQUE: Multiplanar, multiecho pulse sequences of the brain and surrounding structures were obtained without intravenous contrast. Angiographic images of the Circle of Willis were obtained using MRA technique without intravenous contrast. Angiographic images of the neck were obtained using MRA technique without and with intravenous contrast. Carotid stenosis measurements (when applicable) are obtained utilizing NASCET criteria, using the distal internal carotid diameter as the denominator. CONTRAST:  37mL MULTIHANCE GADOBENATE DIMEGLUMINE 529 MG/ML IV SOLN COMPARISON:  Head CT without contrast 0323 hours today. FINDINGS: MRI HEAD FINDINGS Brain: Patchy acute areas of restricted diffusion in the left hemisphere including the left superior frontal gyrus, motor strip, sensory strip, pre motor area, left centrum semiovale, and also in the left superior occipital lobe and occipital pole cortex. No contralateral right hemisphere or posterior fossa restricted diffusion identified. T2 and FLAIR hyperintensity in the affected areas with no associated hemorrhage or mass effect. Underlying inferior left parietal lobe cortical encephalomalacia and hemosiderin (series 14, image 15). Patchy and confluent bilateral superimposed cerebral white matter T2 and FLAIR hyperintensity. Small chronic microhemorrhage also in the right occipital lobe white matter. 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 Vascular: Absent flow void in the distal right vertebral artery, preserved distal left vertebral artery and  basilar flow void. ICA siphon flow voids are preserved. Cavum septum pellucidum anatomic variant. No ventriculomegaly. No midline shift. Skull and upper cervical spine: Negative. Sinuses/Orbits: Negative aside from mild mucosal thickening and/or fluid in the left sphenoid sinus. Other: Trace right mastoid fluid. Visible internal auditory structures appear normal. Negative scalp soft tissues. MRA NECK FINDINGS Precontrast time-of-flight images demonstrate asymmetrically decreased antegrade flow signal in the cervical left ICA. Bilateral CCA flow signal appears fairly symmetric. Preserved right ICA flow signal. Flow signal in the left vertebral artery which appears dominant in the neck. Post-contrast MRA images reveal a radiographic string sign stenosis at the left ICA origin, and  highly irregular contour of the left ICA bulb with additional mild stenosis at the distal bulb level. See series 26, image 52 and series 110, image 16. Despite these findings there is preserved enhancement of the left ICA to the skullbase and into the left ICA siphon. Only mild irregularity of the right CCA and cervical right ICA. Proximal subclavian arteries are patent but irregular. The right vertebral artery is occluded from its origin to the V3 segment with only faint enhancement in the V4 segment and at the right PICA 0 which may be retrograde. There is mild stenosis at the left vertebral artery origin. The left vertebral artery is otherwise normal to the vertebrobasilar junction. MRA HEAD FINDINGS Absent antegrade flow in the distal right vertebral artery. Preserved flow in the distal left vertebral artery and basilar. SCA and right PCA origins are patent. The left P1 segment is irregular and stenotic. Left posterior communicating artery appears to be the primary source of left PCA flow the left P2 segment is normal. Left P3 branches are attenuated. Right PCA branches have a more normal appearance. Right posterior communicating artery  is diminutive or absent. Antegrade flow in the right ICA siphon to the right terminus with no stenosis. Patent right MCA and ACA origins. Decreased antegrade flow signal in the vertical petrous and proximal cavernous segment of the left ICA siphon (series 104, image 6), but the supraclinoid segment and left ICA to sinus are patent. Left MCA origin and M1 segment are patent with only mild irregularity. Left MCA bifurcation is patent. No left MCA branch occlusion is identified. The left ACA origin is severely stenotic. Patent anterior communicating artery. A2 segments appear symmetric and within normal limits. Moderate stenosis at the right MCA origin, but visible right MCA branches are otherwise within normal limits. IMPRESSION: 1. 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 No associated hemorrhage or mass effect. 2. 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 Patent left MCA, and no left MCA branch occlusion identified. 3. Proximal left PCA P1 stenosis with left posterior communicating artery contribution to the left PCA, corresponding to the left PCA territory involvement. 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 Electronically Signed: By: Genevie Ann M.D. On: 12/23/2016 10:56   Mr [name] Neck W Wo Contrast  Addendum Date: 12/23/2016   ADDENDUM REPORT: 12/23/2016 11:20 ADDENDUM: Study discussed by telephone with Dr. Gladstone Lighter on 12/23/2016 at 1113 hours. Electronically Signed   By: Genevie Ann M.D.   On: 12/23/2016 11:20   Result Date: 12/23/2016 CLINICAL DATA:  76 year old male recently treated for sepsis. New onset right upper extremity weakness upon waking at 0300 hours. Weakness progressing to the right leg. Subsequent dense right side hemiplegia. EXAM: MRI HEAD WITHOUT CONTRAST MRA HEAD  WITHOUT CONTRAST MRA NECK WITHOUT AND WITH CONTRAST TECHNIQUE: Multiplanar, multiecho pulse sequences of the brain and surrounding structures were obtained without intravenous contrast. Angiographic images of the Circle of Willis were obtained using MRA technique without intravenous contrast. Angiographic images of the neck were obtained using MRA technique without and with intravenous contrast. Carotid stenosis measurements (when applicable) are obtained utilizing NASCET criteria, using the distal internal carotid diameter as the denominator. CONTRAST:  35mL MULTIHANCE GADOBENATE DIMEGLUMINE 529 MG/ML IV SOLN COMPARISON:  Head CT without contrast 0323 hours today. FINDINGS: MRI HEAD  FINDINGS Brain: Patchy acute areas of restricted diffusion in the left hemisphere including the left superior frontal gyrus, motor strip, sensory strip, pre motor area, left centrum semiovale, and also in the left superior occipital lobe and occipital pole cortex. No contralateral right hemisphere or posterior fossa restricted diffusion identified. T2 and FLAIR hyperintensity in the affected areas with no associated hemorrhage or mass effect. Underlying inferior left parietal lobe cortical encephalomalacia and hemosiderin (series 14, image 15). Patchy and confluent bilateral superimposed cerebral white matter T2 and FLAIR hyperintensity. Small chronic microhemorrhage also in the right occipital lobe white matter. 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 Vascular: Absent flow void in the distal right vertebral artery, preserved distal left vertebral artery and basilar flow void. ICA siphon flow voids are preserved. Cavum septum pellucidum anatomic variant. No ventriculomegaly. No midline shift. Skull and upper cervical spine: Negative. Sinuses/Orbits: Negative aside from mild mucosal thickening and/or fluid in the left sphenoid sinus. Other: Trace right mastoid fluid. Visible internal auditory structures appear  normal. Negative scalp soft tissues. MRA NECK FINDINGS Precontrast time-of-flight images demonstrate asymmetrically decreased antegrade flow signal in the cervical left ICA. Bilateral CCA flow signal appears fairly symmetric. Preserved right ICA flow signal. Flow signal in the left vertebral artery which appears dominant in the neck. Post-contrast MRA images reveal a radiographic string sign stenosis at the left ICA origin, and highly irregular contour of the left ICA bulb with additional mild stenosis at the distal bulb level. See series 26, image 52 and series 110, image 16. Despite these findings there is preserved enhancement of the left ICA to the skullbase and into the left ICA siphon. Only mild irregularity of the right CCA and cervical right ICA. Proximal subclavian arteries are patent but irregular. The right vertebral artery is occluded from its origin to the V3 segment with only faint enhancement in the V4 segment and at the right PICA 0 which may be retrograde. There is mild stenosis at the left vertebral artery origin. The left vertebral artery is otherwise normal to the vertebrobasilar junction. MRA HEAD FINDINGS Absent antegrade flow in the distal right vertebral artery. Preserved flow in the distal left vertebral artery and basilar. SCA and right PCA origins are patent. The left P1 segment is irregular and stenotic. Left posterior communicating artery appears to be the primary source of left PCA flow the left P2 segment is normal. Left P3 branches are attenuated. Right PCA branches have a more normal appearance. Right posterior communicating artery is diminutive or absent. Antegrade flow in the right ICA siphon to the right terminus with no stenosis. Patent right MCA and ACA origins. Decreased antegrade flow signal in the vertical petrous and proximal cavernous segment of the left ICA siphon (series 104, image 6), but the supraclinoid segment and left ICA to sinus are patent. Left MCA origin and M1  segment are patent with only mild irregularity. Left MCA bifurcation is patent. No left MCA branch occlusion is identified. The left ACA origin is severely stenotic. Patent anterior communicating artery. A2 segments appear symmetric and within normal limits. Moderate stenosis at the right MCA origin, but visible right MCA branches are otherwise within normal limits. IMPRESSION: 1. 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 No associated hemorrhage or mass effect. 2. 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 Patent left MCA, and no left MCA branch  occlusion identified. 3. Proximal left PCA P1 stenosis with left posterior communicating artery contribution to the left PCA, corresponding to the left PCA territory involvement. 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 Electronically Signed: By: Genevie Ann M.D. On: 12/23/2016 10:56   Mr Brain Wo Contrast  Addendum Date: 12/23/2016   ADDENDUM REPORT: 12/23/2016 11:20 ADDENDUM: Study discussed by telephone with Dr. Gladstone Lighter on 12/23/2016 at 1113 hours. Electronically Signed   By: Genevie Ann M.D.   On: 12/23/2016 11:20   Result Date: 12/23/2016 CLINICAL DATA:  76 year old male recently treated for sepsis. New onset right upper extremity weakness upon waking at 0300 hours. Weakness progressing to the right leg. Subsequent dense right side hemiplegia. EXAM: MRI HEAD WITHOUT CONTRAST MRA HEAD WITHOUT CONTRAST MRA NECK WITHOUT AND WITH CONTRAST TECHNIQUE: Multiplanar, multiecho pulse sequences of the brain and surrounding structures were obtained without intravenous contrast. Angiographic images of the Circle of Willis were obtained using MRA technique without intravenous contrast. Angiographic images of the neck were obtained using MRA technique  without and with intravenous contrast. Carotid stenosis measurements (when applicable) are obtained utilizing NASCET criteria, using the distal internal carotid diameter as the denominator. CONTRAST:  36mL MULTIHANCE GADOBENATE DIMEGLUMINE 529 MG/ML IV SOLN COMPARISON:  Head CT without contrast 0323 hours today. FINDINGS: MRI HEAD FINDINGS Brain: Patchy acute areas of restricted diffusion in the left hemisphere including the left superior frontal gyrus, motor strip, sensory strip, pre motor area, left centrum semiovale, and also in the left superior occipital lobe and occipital pole cortex. No contralateral right hemisphere or posterior fossa restricted diffusion identified. T2 and FLAIR hyperintensity in the affected areas with no associated hemorrhage or mass effect. Underlying inferior left parietal lobe cortical encephalomalacia and hemosiderin (series 14, image 15). Patchy and confluent bilateral superimposed cerebral white matter T2 and FLAIR hyperintensity. Small chronic microhemorrhage also in the right occipital lobe white matter. 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 Vascular: Absent flow void in the distal right vertebral artery, preserved distal left vertebral artery and basilar flow void. ICA siphon flow voids are preserved. Cavum septum pellucidum anatomic variant. No ventriculomegaly. No midline shift. Skull and upper cervical spine: Negative. Sinuses/Orbits: Negative aside from mild mucosal thickening and/or fluid in the left sphenoid sinus. Other: Trace right mastoid fluid. Visible internal auditory structures appear normal. Negative scalp soft tissues. MRA NECK FINDINGS Precontrast time-of-flight images demonstrate asymmetrically decreased antegrade flow signal in the cervical left ICA. Bilateral CCA flow signal appears fairly symmetric. Preserved right ICA flow signal. Flow signal in the left vertebral artery which appears dominant in the neck. Post-contrast MRA  images reveal a radiographic string sign stenosis at the left ICA origin, and highly irregular contour of the left ICA bulb with additional mild stenosis at the distal bulb level. See series 26, image 52 and series 110, image 16. Despite these findings there is preserved enhancement of the left ICA to the skullbase and into the left ICA siphon. Only mild irregularity of the right CCA and cervical right ICA. Proximal subclavian arteries are patent but irregular. The right vertebral artery is occluded from its origin to the V3 segment with only faint enhancement in the V4 segment and at the right PICA 0 which may be retrograde. There is mild stenosis at the left vertebral artery origin. The left vertebral artery is otherwise normal to the vertebrobasilar junction. MRA HEAD FINDINGS Absent antegrade flow in  the distal right vertebral artery. Preserved flow in the distal left vertebral artery and basilar. SCA and right PCA origins are patent. The left P1 segment is irregular and stenotic. Left posterior communicating artery appears to be the primary source of left PCA flow the left P2 segment is normal. Left P3 branches are attenuated. Right PCA branches have a more normal appearance. Right posterior communicating artery is diminutive or absent. Antegrade flow in the right ICA siphon to the right terminus with no stenosis. Patent right MCA and ACA origins. Decreased antegrade flow signal in the vertical petrous and proximal cavernous segment of the left ICA siphon (series 104, image 6), but the supraclinoid segment and left ICA to sinus are patent. Left MCA origin and M1 segment are patent with only mild irregularity. Left MCA bifurcation is patent. No left MCA branch occlusion is identified. The left ACA origin is severely stenotic. Patent anterior communicating artery. A2 segments appear symmetric and within normal limits. Moderate stenosis at the right MCA origin, but visible right MCA branches are otherwise within  normal limits. IMPRESSION: 1. 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 No associated hemorrhage or mass effect. 2. 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 Patent left MCA, and no left MCA branch occlusion identified. 3. Proximal left PCA P1 stenosis with left posterior communicating artery contribution to the left PCA, corresponding to the left PCA territory involvement. 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 Electronically Signed: By: Genevie Ann M.D. 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 He has been treated for an MSSA back abscess as well as diverticulitis and C diff. He was on IV abx until 7/20 when he was changed to oral keflex and was supposed to have picc removed (but it was left in).  On this admit afebrile and wbc at baseline of around 22.   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 UA + but UCX negative.  C diff fu negative. BCX negative s/p bedside I and D. Developed embolic Stroke S/p  IVIG given low IGG levels at last hospitalization.  Recommendation Cont  ancef for the MSSA from his back  He has chronic foley and with neg UCX would not treat for UTI He is severely deconditioned and I think should not be dced home but to SNF as he has proven that he cannot do well at home and has not shown up to multiple scheduled visits as otpt with me and with surgery. At dc can likely remove picc and send on oral abx keflex 500 TID for 21 days with otpt fu to determine if needs to be extended .  you very much for the consult. Will follow with you.  Leonel Ramsay   12/24/2016, 2:41 PM

## 2016-12-24 NOTE — Progress Notes (Signed)
Subjective: Patient more lethargic today.  Continued right sided weakness  Objective: Current vital signs: BP 133/65 (BP Location: Right Arm)   Pulse 99   Temp 98.7 F (37.1 C) (Oral)   Resp 18   Ht 6\' 3"  (1.905 m)   Wt 101.3 kg (223 lb 6.4 oz)   SpO2 94%   BMI 27.92 kg/m  Vital signs in last 24 hours: Temp:  [98.3 F (36.8 C)-98.7 F (37.1 C)] 98.7 F (37.1 C) (08/03 0800) Pulse Rate:  [99-105] 99 (08/03 0800) Resp:  [16-18] 18 (08/03 0800) BP: (125-137)/(65-67) 133/65 (08/03 0800) SpO2:  [93 %-95 %] 94 % (08/03 0800)  Intake/Output from previous day: 08/02 0701 - 08/03 0700 In: 4139 [P.O.:480; I.V.:3559; IV Piggyback:100] Out: 2150 [Urine:2150] Intake/Output this shift: Total I/O In: 240 [P.O.:240] Out: -  Nutritional status: DIET DYS 3 Room service appropriate? Yes with Assist; Fluid consistency: Thin  Neurologic Exam: Mental Status: Lethargic.  Grunts response to questioning.  Follows simple commands. Cranial Nerves: II: Discs flat bilaterally; Partial right field cut, pupils equal, round, reactive to light and accommodation III,IV, VI: ptosis not present, extra-ocular motions intact bilaterally V,VII: right facial droop VIII: hearing normal bilaterally IX,X: gag reflex present XI: bilateral shoulder shrug XII: midline tongue extension Motor: Right :  Upper extremity   0/5                                                  Left:     Upper extremity   5/5             Lower extremity   1-2/5                                                           Lower extremity   5/5 Tone and bulk:normal tone throughout; no atrophy noted   Lab Results: Basic Metabolic Panel:  Recent Labs Lab 12/18/16 1235 12/19/16 0426 12/23/16 0333  NA 138 141 141  K 3.8 3.9 3.4*  CL 103 106 107  CO2 26 27 27   GLUCOSE 113* 96 110*  BUN 14 11 9   CREATININE 1.07 1.01 0.80  CALCIUM 10.0 9.3 9.1    Liver Function Tests:  Recent Labs Lab 12/18/16 1235  AST 47*  ALT 42   ALKPHOS 101  BILITOT 0.7  PROT 6.6  ALBUMIN 3.1*   No results for input(s): LIPASE, AMYLASE in the last 168 hours. No results for input(s): AMMONIA in the last 168 hours.  CBC:  Recent Labs Lab 12/18/16 1235 12/19/16 0426 12/20/16 0431 12/21/16 0906 12/22/16 0549  WBC 22.9* 19.6* 17.6* 16.4* 15.8*  NEUTROABS 10.0*  --   --   --   --   HGB 14.5 13.0 13.2 12.5* 12.4*  HCT 41.5 38.3* 38.6* 36.6* 36.0*  MCV 89.3 90.0 90.3 89.1 88.4  PLT 351 313 299 290 268    Cardiac Enzymes:  Recent Labs Lab 12/18/16 1235  TROPONINI <0.03    Lipid Panel:  Recent Labs Lab 12/23/16 0330  CHOL 144  TRIG 170*  HDL 23*  CHOLHDL 6.3  VLDL 34  LDLCALC 87    CBG: No results for  input(s): GLUCAP in the last 168 hours.  Microbiology: Results for orders placed or performed during the hospital encounter of 12/18/16  Culture, blood (Routine x 2)     Status: None   Collection Time: 12/18/16 12:35 PM  Result Value Ref Range Status   Specimen Description BLOOD RIGHT ANTECUBITAL  Final   Special Requests   Final    BOTTLES DRAWN AEROBIC AND ANAEROBIC Blood Culture adequate volume   Culture NO GROWTH 5 DAYS  Final   Report Status 12/23/2016 FINAL  Final  Culture, blood (Routine x 2)     Status: None   Collection Time: 12/18/16 12:35 PM  Result Value Ref Range Status   Specimen Description BLOOD BLOOD LEFT WRIST  Final   Special Requests   Final    BOTTLES DRAWN AEROBIC AND ANAEROBIC Blood Culture adequate volume   Culture NO GROWTH 5 DAYS  Final   Report Status 12/23/2016 FINAL  Final  Urine culture     Status: None   Collection Time: 12/18/16 12:35 PM  Result Value Ref Range Status   Specimen Description URINE, RANDOM  Final   Special Requests NONE  Final   Culture   Final    NO GROWTH Performed at [facility], [place] [address] [place], [place] [address]    Report Status 12/19/2016 FINAL  Final  C difficile quick scan w PCR reflex     Status: None   Collection Time:  12/18/16  6:02 PM  Result Value Ref Range Status   C Diff antigen NEGATIVE NEGATIVE Final   C Diff toxin NEGATIVE NEGATIVE Final   C Diff interpretation No C. difficile detected.  Final  MRSA PCR Screening     Status: None   Collection Time: 12/19/16 11:24 AM  Result Value Ref Range Status   MRSA by PCR NEGATIVE NEGATIVE Final    Comment:        The GeneXpert MRSA Assay (FDA approved for NASAL specimens only), is one component of a comprehensive MRSA colonization surveillance program. It is not intended to diagnose MRSA infection nor to guide or monitor treatment for MRSA infections.   Aerobic/Anaerobic Culture (surgical/deep wound)     Status: None (Preliminary result)   Collection Time: 12/21/16 12:28 PM  Result Value Ref Range Status   Specimen Description BACK  Final   Special Requests Immunocompromised  Final   Gram Stain   Final    ABUNDANT WBC PRESENT, PREDOMINANTLY PMN NO ORGANISMS SEEN    Culture   Final    NO GROWTH 3 DAYS NO ANAEROBES ISOLATED; CULTURE IN PROGRESS FOR 5 DAYS Performed at [facility], [address], [place], [place] [address]    Report Status PENDING  Incomplete    Coagulation Studies: No results for input(s): LABPROT, INR in the last 72 hours.  Imaging: Ct Head Wo Contrast  Result Date: 12/24/2016 CLINICAL DATA:  76 year old male with left facial droop, right arm and leg weakness. Left MCA and PCA territory infarcts suspected on brain MRI yesterday. EXAM: CT HEAD WITHOUT CONTRAST TECHNIQUE: Contiguous axial images were obtained from the base of the skull through the vertex without intravenous contrast. COMPARISON:  Brain MRI 12/23/2016, head CT 12/23/2016. FINDINGS: Brain: Acute on chronic left lateral occipital evolving infarct and and subtle acute superior left MCA cortical and white matter infarct corresponding to the MRI findings yesterday. No associated hemorrhage or mass effect. Minimal increased hypodensity on CT compared to  12/23/2016. Stable gray-white matter differentiation elsewhere  with confluent bilateral chronic white matter hypodensity. No intracranial mass effect. Stable ventricle size and configuration. Vascular: Calcified atherosclerosis at the skull base. Skull: No acute osseous abnormality identified. Sinuses/Orbits: stable and well pneumatized aside from bubbly opacity in the left sphenoid. Other: No acute orbit or scalp soft tissue findings. IMPRESSION: 1. 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 No associated hemorrhage or mass effect. 2. No new intracranial abnormality. Electronically Signed   By: Genevie Ann M.D.   On: 12/24/2016 12:20   Ct Head Wo Contrast  Result Date: 12/23/2016 CLINICAL DATA:  New onset right arm weakness. EXAM: CT HEAD WITHOUT CONTRAST TECHNIQUE: Contiguous axial images were obtained from the base of the skull through the vertex without intravenous contrast. COMPARISON:  11/30/2016 FINDINGS: Brain: Diffuse cerebral atrophy. Mild ventricular dilatation consistent with central atrophy. Low-attenuation changes in the deep white matter consistent small vessel ischemia. Cavum septum pellucidum. Small area of old encephalomalacia in the left posterior parietal lobe unchanged since previous study. No evidence of acute infarction, hemorrhage, hydrocephalus, extra-axial collection or mass lesion/mass effect. Vascular: Vascular calcifications are present. Skull: No depressed skull fractures. Sinuses/Orbits: Paranasal sinuses and mastoid air cells are clear. Other: No change since previous study. IMPRESSION: No acute intracranial abnormalities. Chronic atrophy and small vessel ischemic changes. 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 No change since previous studies. Electronically Signed   By: Lucienne Capers M.D.   On: 12/23/2016 03:35   Mr [name] Head Wo Contrast  Addendum Date: 12/23/2016   ADDENDUM REPORT: 12/23/2016 11:20  ADDENDUM: Study discussed by telephone with Dr. Gladstone Lighter on 12/23/2016 at 1113 hours. Electronically Signed   By: Genevie Ann M.D.   On: 12/23/2016 11:20   Result Date: 12/23/2016 CLINICAL DATA:  76 year old male recently treated for sepsis. New onset right upper extremity weakness upon waking at 0300 hours. Weakness progressing to the right leg. Subsequent dense right side hemiplegia. EXAM: MRI HEAD WITHOUT CONTRAST MRA HEAD WITHOUT CONTRAST MRA NECK WITHOUT AND WITH CONTRAST TECHNIQUE: Multiplanar, multiecho pulse sequences of the brain and surrounding structures were obtained without intravenous contrast. Angiographic images of the Circle of Willis were obtained using MRA technique without intravenous contrast. Angiographic images of the neck were obtained using MRA technique without and with intravenous contrast. Carotid stenosis measurements (when applicable) are obtained utilizing NASCET criteria, using the distal internal carotid diameter as the denominator. CONTRAST:  51mL MULTIHANCE GADOBENATE DIMEGLUMINE 529 MG/ML IV SOLN COMPARISON:  Head CT without contrast 0323 hours today. FINDINGS: MRI HEAD FINDINGS Brain: Patchy acute areas of restricted diffusion in the left hemisphere including the left superior frontal gyrus, motor strip, sensory strip, pre motor area, left centrum semiovale, and also in the left superior occipital lobe and occipital pole cortex. No contralateral right hemisphere or posterior fossa restricted diffusion identified. T2 and FLAIR hyperintensity in the affected areas with no associated hemorrhage or mass effect. Underlying inferior left parietal lobe cortical encephalomalacia and hemosiderin (series 14, image 15). Patchy and confluent bilateral superimposed cerebral white matter T2 and FLAIR hyperintensity. Small chronic microhemorrhage also in the right occipital lobe white matter. 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 Vascular: Absent flow void in  the distal right vertebral artery, preserved distal left vertebral artery and basilar flow void. ICA siphon flow voids are preserved. Cavum septum pellucidum anatomic variant. No ventriculomegaly. No midline shift. Skull and upper cervical spine: Negative. Sinuses/Orbits: Negative aside from mild  mucosal thickening and/or fluid in the left sphenoid sinus. Other: Trace right mastoid fluid. Visible internal auditory structures appear normal. Negative scalp soft tissues. MRA NECK FINDINGS Precontrast time-of-flight images demonstrate asymmetrically decreased antegrade flow signal in the cervical left ICA. Bilateral CCA flow signal appears fairly symmetric. Preserved right ICA flow signal. Flow signal in the left vertebral artery which appears dominant in the neck. Post-contrast MRA images reveal a radiographic string sign stenosis at the left ICA origin, and highly irregular contour of the left ICA bulb with additional mild stenosis at the distal bulb level. See series 26, image 52 and series 110, image 16. Despite these findings there is preserved enhancement of the left ICA to the skullbase and into the left ICA siphon. Only mild irregularity of the right CCA and cervical right ICA. Proximal subclavian arteries are patent but irregular. The right vertebral artery is occluded from its origin to the V3 segment with only faint enhancement in the V4 segment and at the right PICA 0 which may be retrograde. There is mild stenosis at the left vertebral artery origin. The left vertebral artery is otherwise normal to the vertebrobasilar junction. MRA HEAD FINDINGS Absent antegrade flow in the distal right vertebral artery. Preserved flow in the distal left vertebral artery and basilar. SCA and right PCA origins are patent. The left P1 segment is irregular and stenotic. Left posterior communicating artery appears to be the primary source of left PCA flow the left P2 segment is normal. Left P3 branches are attenuated. Right PCA  branches have a more normal appearance. Right posterior communicating artery is diminutive or absent. Antegrade flow in the right ICA siphon to the right terminus with no stenosis. Patent right MCA and ACA origins. Decreased antegrade flow signal in the vertical petrous and proximal cavernous segment of the left ICA siphon (series 104, image 6), but the supraclinoid segment and left ICA to sinus are patent. Left MCA origin and M1 segment are patent with only mild irregularity. Left MCA bifurcation is patent. No left MCA branch occlusion is identified. The left ACA origin is severely stenotic. Patent anterior communicating artery. A2 segments appear symmetric and within normal limits. Moderate stenosis at the right MCA origin, but visible right MCA branches are otherwise within normal limits. IMPRESSION: 1. 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 No associated hemorrhage or mass effect. 2. 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 Patent left MCA, and no left MCA branch occlusion identified. 3. Proximal left PCA P1 stenosis with left posterior communicating artery contribution to the left PCA, corresponding to the left PCA territory involvement. 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 Electronically Signed: By: Genevie Ann M.D. On: 12/23/2016 10:56   Mr [name] Neck W Wo Contrast  Addendum Date: 12/23/2016   ADDENDUM REPORT: 12/23/2016 11:20 ADDENDUM: Study discussed by telephone with Dr. Gladstone Lighter on 12/23/2016 at 1113 hours. Electronically Signed   By: Genevie Ann M.D.   On: 12/23/2016 11:20   Result Date: 12/23/2016 CLINICAL DATA:  76 year old male recently treated for sepsis. New onset right upper extremity weakness upon waking at 0300 hours. Weakness progressing to the right leg.  Subsequent dense right side hemiplegia. EXAM: MRI HEAD WITHOUT CONTRAST MRA HEAD WITHOUT CONTRAST MRA NECK WITHOUT AND WITH CONTRAST TECHNIQUE: Multiplanar, multiecho pulse sequences of  the brain and surrounding structures were obtained without intravenous contrast. Angiographic images of the Circle of Willis were obtained using MRA technique without intravenous contrast. Angiographic images of the neck were obtained using MRA technique without and with intravenous contrast. Carotid stenosis measurements (when applicable) are obtained utilizing NASCET criteria, using the distal internal carotid diameter as the denominator. CONTRAST:  55mL MULTIHANCE GADOBENATE DIMEGLUMINE 529 MG/ML IV SOLN COMPARISON:  Head CT without contrast 0323 hours today. FINDINGS: MRI HEAD FINDINGS Brain: Patchy acute areas of restricted diffusion in the left hemisphere including the left superior frontal gyrus, motor strip, sensory strip, pre motor area, left centrum semiovale, and also in the left superior occipital lobe and occipital pole cortex. No contralateral right hemisphere or posterior fossa restricted diffusion identified. T2 and FLAIR hyperintensity in the affected areas with no associated hemorrhage or mass effect. Underlying inferior left parietal lobe cortical encephalomalacia and hemosiderin (series 14, image 15). Patchy and confluent bilateral superimposed cerebral white matter T2 and FLAIR hyperintensity. Small chronic microhemorrhage also in the right occipital lobe white matter. 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 Vascular: Absent flow void in the distal right vertebral artery, preserved distal left vertebral artery and basilar flow void. ICA siphon flow voids are preserved. Cavum septum pellucidum anatomic variant. No ventriculomegaly. No midline shift. Skull and upper cervical spine: Negative. Sinuses/Orbits: Negative aside from mild mucosal thickening and/or fluid in the left sphenoid  sinus. Other: Trace right mastoid fluid. Visible internal auditory structures appear normal. Negative scalp soft tissues. MRA NECK FINDINGS Precontrast time-of-flight images demonstrate asymmetrically decreased antegrade flow signal in the cervical left ICA. Bilateral CCA flow signal appears fairly symmetric. Preserved right ICA flow signal. Flow signal in the left vertebral artery which appears dominant in the neck. Post-contrast MRA images reveal a radiographic string sign stenosis at the left ICA origin, and highly irregular contour of the left ICA bulb with additional mild stenosis at the distal bulb level. See series 26, image 52 and series 110, image 16. Despite these findings there is preserved enhancement of the left ICA to the skullbase and into the left ICA siphon. Only mild irregularity of the right CCA and cervical right ICA. Proximal subclavian arteries are patent but irregular. The right vertebral artery is occluded from its origin to the V3 segment with only faint enhancement in the V4 segment and at the right PICA 0 which may be retrograde. There is mild stenosis at the left vertebral artery origin. The left vertebral artery is otherwise normal to the vertebrobasilar junction. MRA HEAD FINDINGS Absent antegrade flow in the distal right vertebral artery. Preserved flow in the distal left vertebral artery and basilar. SCA and right PCA origins are patent. The left P1 segment is irregular and stenotic. Left posterior communicating artery appears to be the primary source of left PCA flow the left P2 segment is normal. Left P3 branches are attenuated. Right PCA branches have a more normal appearance. Right posterior communicating artery is diminutive or absent. Antegrade flow in the right ICA siphon to the right terminus with no stenosis. Patent right MCA and ACA origins. Decreased antegrade flow signal in the vertical petrous and proximal cavernous segment of the left ICA siphon (series 104, image 6),  but the supraclinoid segment and left ICA to sinus are patent. Left MCA origin and M1 segment are patent with only mild irregularity. Left MCA bifurcation is patent. No left MCA branch occlusion is identified. The left ACA origin is severely stenotic. Patent  anterior communicating artery. A2 segments appear symmetric and within normal limits. Moderate stenosis at the right MCA origin, but visible right MCA branches are otherwise within normal limits. IMPRESSION: 1. 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 No associated hemorrhage or mass effect. 2. 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 Patent left MCA, and no left MCA branch occlusion identified. 3. Proximal left PCA P1 stenosis with left posterior communicating artery contribution to the left PCA, corresponding to the left PCA territory involvement. 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 Electronically Signed: By: Genevie Ann M.D. On: 12/23/2016 10:56   Mr Brain Wo Contrast  Addendum Date: 12/23/2016   ADDENDUM REPORT: 12/23/2016 11:20 ADDENDUM: Study discussed by telephone with Dr. Gladstone Lighter on 12/23/2016 at 1113 hours. Electronically Signed   By: Genevie Ann M.D.   On: 12/23/2016 11:20   Result Date: 12/23/2016 CLINICAL DATA:  76 year old male recently treated for sepsis. New onset right upper extremity weakness upon waking at 0300 hours. Weakness progressing to the right leg. Subsequent dense right side hemiplegia. EXAM: MRI HEAD WITHOUT CONTRAST MRA HEAD WITHOUT CONTRAST MRA NECK WITHOUT AND WITH CONTRAST TECHNIQUE: Multiplanar, multiecho pulse sequences of the brain and surrounding structures were obtained without intravenous contrast. Angiographic images of the Circle of Willis were obtained using MRA technique without  intravenous contrast. Angiographic images of the neck were obtained using MRA technique without and with intravenous contrast. Carotid stenosis measurements (when applicable) are obtained utilizing NASCET criteria, using the distal internal carotid diameter as the denominator. CONTRAST:  4mL MULTIHANCE GADOBENATE DIMEGLUMINE 529 MG/ML IV SOLN COMPARISON:  Head CT without contrast 0323 hours today. FINDINGS: MRI HEAD FINDINGS Brain: Patchy acute areas of restricted diffusion in the left hemisphere including the left superior frontal gyrus, motor strip, sensory strip, pre motor area, left centrum semiovale, and also in the left superior occipital lobe and occipital pole cortex. No contralateral right hemisphere or posterior fossa restricted diffusion identified. T2 and FLAIR hyperintensity in the affected areas with no associated hemorrhage or mass effect. Underlying inferior left parietal lobe cortical encephalomalacia and hemosiderin (series 14, image 15). Patchy and confluent bilateral superimposed cerebral white matter T2 and FLAIR hyperintensity. Small chronic microhemorrhage also in the right occipital lobe white matter. 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 Vascular: Absent flow void in the distal right vertebral artery, preserved distal left vertebral artery and basilar flow void. ICA siphon flow voids are preserved. Cavum septum pellucidum anatomic variant. No ventriculomegaly. No midline shift. Skull and upper cervical spine: Negative. Sinuses/Orbits: Negative aside from mild mucosal thickening and/or fluid in the left sphenoid sinus. Other: Trace right mastoid fluid. Visible internal auditory structures appear normal. Negative scalp soft tissues. MRA NECK FINDINGS Precontrast time-of-flight images demonstrate asymmetrically decreased antegrade flow signal in the cervical left ICA. Bilateral CCA flow signal appears fairly symmetric. Preserved right ICA flow signal. Flow signal  in the left vertebral artery which appears dominant in the neck. Post-contrast MRA images reveal a radiographic string sign stenosis at the left ICA origin, and highly irregular contour of the left ICA bulb with additional mild stenosis at the distal bulb level. See series 26, image 52 and series 110, image 16. Despite these findings there is preserved enhancement of the left ICA to the skullbase and into  the left ICA siphon. Only mild irregularity of the right CCA and cervical right ICA. Proximal subclavian arteries are patent but irregular. The right vertebral artery is occluded from its origin to the V3 segment with only faint enhancement in the V4 segment and at the right PICA 0 which may be retrograde. There is mild stenosis at the left vertebral artery origin. The left vertebral artery is otherwise normal to the vertebrobasilar junction. MRA HEAD FINDINGS Absent antegrade flow in the distal right vertebral artery. Preserved flow in the distal left vertebral artery and basilar. SCA and right PCA origins are patent. The left P1 segment is irregular and stenotic. Left posterior communicating artery appears to be the primary source of left PCA flow the left P2 segment is normal. Left P3 branches are attenuated. Right PCA branches have a more normal appearance. Right posterior communicating artery is diminutive or absent. Antegrade flow in the right ICA siphon to the right terminus with no stenosis. Patent right MCA and ACA origins. Decreased antegrade flow signal in the vertical petrous and proximal cavernous segment of the left ICA siphon (series 104, image 6), but the supraclinoid segment and left ICA to sinus are patent. Left MCA origin and M1 segment are patent with only mild irregularity. Left MCA bifurcation is patent. No left MCA branch occlusion is identified. The left ACA origin is severely stenotic. Patent anterior communicating artery. A2 segments appear symmetric and within normal limits. Moderate  stenosis at the right MCA origin, but visible right MCA branches are otherwise within normal limits. IMPRESSION: 1. 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 No associated hemorrhage or mass effect. 2. 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 Patent left MCA, and no left MCA branch occlusion identified. 3. Proximal left PCA P1 stenosis with left posterior communicating artery contribution to the left PCA, corresponding to the left PCA territory involvement. 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 Electronically Signed: By: Genevie Ann M.D. On: 12/23/2016 10:56    Medications:  I have reviewed the patient's current medications. Scheduled: . aspirin  81 mg Oral Daily  . atorvastatin  40 mg Oral q1800  . clopidogrel  75 mg Oral Daily  . enoxaparin (LOVENOX) injection  40 mg Subcutaneous Q24H  . feeding supplement (ENSURE ENLIVE)  237 mL Oral TID BM  . finasteride  5 mg Oral Daily  . lidocaine  1 patch Transdermal Q24H  . lidocaine-EPINEPHrine  30 mL Intradermal Once  . liver oil-zinc oxide   Topical TID  . metoprolol tartrate  12.5 mg Oral BID  . tamsulosin  0.4 mg Oral Daily    Assessment/Plan: Patient lethargic today.  On ASA and Plavix.  Concern for further infarcts or hemorrhage into existing infarcts.  Refusing blood draws so A1c not performed.  LDL 87.  Echocardiogram shows no cardiac source of emboli with an EF of 55-60%.   Recommendations: 1. Repeat Head CT today 2. Continue ASA and Plavix    LOS: 6 days   Alexis Goodell, MD Neurology [phone] 12/24/2016  4:38 PM   Addendum: Repeat head CT not significantly changed.  Will continue ASA and Plavix.  Alexis Goodell, MD Neurology [phone]

## 2016-12-24 NOTE — Progress Notes (Signed)
Palliative:  Full note to follow but summary of decisions:  - DNR (wife says this is consistent and on file with PCP but also to try and help prevent this - explained this is our goal) - NO desire for intervention for carotid but wishes for treatment with medication (understands this is suboptimal) - Adamant about returning home and pursuing home rehab services  Vinie Sill, NP Palliative Medicine Team Pager # [phone]  Team Phone # [phone]

## 2016-12-24 NOTE — Consult Note (Signed)
Consultation Note Date: 12/24/2016   Patient Name: William Kennedy  DOB: [birth year]  MRN: [id number]  Age / Sex: 76 y.o., male  PCP: [name], MD Referring Physician: Gladstone Lighter, MD  Reason for Consultation: Establishing goals of care   HPI/Patient Profile: 76 y.o. male  with past medical history of ongoing subcutaneous abscess of the back, leukemia, urine retention admitted on 12/18/2016 with altered mental status being treated for sepsis with UTI and back wound infection. Hospital stay complicated by acute stroke 12/23/16 with right sided weakness but refuses any interventions or treatment of stenoses found on MRI. Palliative care consulted to assist with GOC as current decisions are inconsistent.   Clinical Assessment and Goals of Care: I met briefly with William Kennedy and his wife at bedside. William Kennedy is not so excited to see another person he has to talk too. He is quite frustrated with being in the hospital. He has difficulty answering some of my questions but it is hard to say if it is from confusion or frustration at times. He does say "I had a kink in my neck that caused a little stroke." We further discussed his goals and hopes. We discussed transfer for intervention and I attempted to find out why he refuses intervention - he seems to not want any type of surgery or procedures. They seem aware that anticoagulation is suboptimal treatment rather than intervention but he stands firm he does not desire transfer or intervention.   With discussed the risk of not intervening to try and fix the problem and lessen risk of future stroke - I asked him "if you have a larger stroke or any other life ending event would you want [place] to pump on your chest, shock your heart, and breathing machine." He was shaking his head no as I explained resuscitation and says "no I've never wanted any of that." His wife  confirms that he has always told them that he would not want this. I explained that this is a DNR - do not resuscitate and does not mean do not treat.   Primary Decision Maker PATIENT and wife at bedside supports his decisions    SUMMARY OF RECOMMENDATIONS   - DNR (wife says this is consistent and on file with PCP) - NO desire for intervention for carotid but wishes for treatment with medication (understands this is suboptimal) - Adamant about returning home and pursuing home rehab services  Code Status/Advance Care Planning:  DNR   Symptom Management:   Per primary  Palliative Prophylaxis:   Bowel Regimen, Delirium Protocol and Frequent Pain Assessment  Additional Recommendations (Limitations, Scope, Preferences):  Full Scope Treatment  Psycho-social/Spiritual:   Desire for further Chaplaincy support:no  Additional Recommendations: Caregiving  Support/Resources  Prognosis:   Poor with carotid stenosis placing him at high risk for recurrent stroke.   Discharge Planning: Home with Palliative Services      Primary Diagnoses: Present on Admission: . Sepsis ([place])   I have reviewed the medical record, interviewed the patient  and family, and examined the patient. The following aspects are pertinent.  Past Medical History:  Diagnosis Date  . Abscess    shoulder  . Leukemia [facility])    Social History   Social History  . Marital status: Married    Spouse name: N/A  . Number of children: N/A  . Years of education: N/A   Social History Main Topics  . Smoking status: Former Research scientist (life sciences)  . Smokeless tobacco: Former Systems developer  . Alcohol use No  . Drug use: No  . Sexual activity: Yes    Partners: Female   Other Topics Concern  . None   Social History Narrative  . None   Family History  Problem Relation Age of Onset  . Diabetes Mother   . Cancer Father    Scheduled Meds: . aspirin  81 mg Oral Daily  . atorvastatin  40 mg Oral q1800  . clopidogrel  75 mg Oral  Daily  . enoxaparin (LOVENOX) injection  40 mg Subcutaneous Q24H  . feeding supplement (ENSURE ENLIVE)  237 mL Oral TID BM  . finasteride  5 mg Oral Daily  . lidocaine  1 patch Transdermal Q24H  . lidocaine-EPINEPHrine  30 mL Intradermal Once  . liver oil-zinc oxide   Topical TID  . metoprolol tartrate  12.5 mg Oral BID  . tamsulosin  0.4 mg Oral Daily   Continuous Infusions: .  ceFAZolin (ANCEF) IV Stopped (12/24/16 1510)   PRN Meds:.acetaminophen **OR** acetaminophen, albuterol, HYDROcodone-acetaminophen, loperamide, ondansetron **OR** ondansetron (ZOFRAN) IV, opium-belladonna, senna-docusate, sodium chloride flush No Known Allergies Review of Systems  Constitutional: Positive for activity change and fatigue.  Musculoskeletal: Positive for back pain.  Neurological: Positive for weakness.    Physical Exam  Constitutional: He appears well-developed.  HENT:  Head: Normocephalic and atraumatic.  Cardiovascular: Normal rate and regular rhythm.   Pulmonary/Chest: Effort normal. No accessory muscle usage. No tachypnea. No respiratory distress.  Abdominal: Normal appearance.  Neurological: He is alert.  Appears mostly oriented, does not cooperate with all questions but able to explain his situation and consequences of his decisions to me at this time  Nursing note and vitals reviewed.   Vital Signs: BP 133/65 (BP Location: Right Arm)   Pulse 99   Temp 98.7 F (37.1 C) (Oral)   Resp 18   Ht '6\' 3"'$  (1.905 m)   Wt 101.3 kg (223 lb 6.4 oz)   SpO2 94%   BMI 27.92 kg/m  Pain Assessment: 0-10 POSS *See Group Information*: S-Acceptable,Sleep, easy to arouse Pain Score: Asleep   SpO2: SpO2: 94 % O2 Device:SpO2: 94 % O2 Flow Rate: .   IO: Intake/output summary:  Intake/Output Summary (Last 24 hours) at 12/24/16 1657 Last data filed at 12/24/16 1200  Gross per 24 hour  Intake             3307 ml  Output             2150 ml  Net             1157 ml    LBM: Last BM Date:  12/22/16 Baseline Weight: Weight: 103.3 kg (227 lb 11.2 oz) Most recent weight: Weight: 101.3 kg (223 lb 6.4 oz)     Palliative Assessment/Data:     Time Total: 43mn  Greater than 50%  of this time was spent counseling and coordinating care related to the above assessment and plan.  Signed by: AVinie Sill NP Palliative Medicine Team Pager # [phone](M-F 8a-5p) Team  Phone # [phone] (Nights/Weekends)

## 2016-12-24 NOTE — NC FL2 (Signed)
[place] LEVEL OF CARE SCREENING TOOL     IDENTIFICATION  Patient Name: William Kennedy Birthdate: 1940-07-20 Sex: male Admission Date (Current Location): 12/18/2016  [place] and [place] Number:  Engineering geologist and Address:  [facility], [address], [place], [place] [address]      Provider Number: [id number]  Attending Physician Name and Address:  Gladstone Lighter, MD  Relative Name and Phone Number:       Current Level of Care: Hospital Recommended Level of Care: [place] Prior Approval Number:    Date Approved/Denied:   PASRR Number:  [id number])  Discharge Plan: SNF    Current Diagnoses: Patient Active Problem List   Diagnosis Date Noted  . Cellulitis   . Urinary tract infection without hematuria   . Confusion 12/06/2016  . Urinary retention   . Microscopic hematuria   . Sepsis ([place]) 11/30/2016  . Left ureteral stone   . Cellulitis and abscess of trunk 10/29/2016  . CLL (chronic lymphocytic leukemia) ([place]) 10/21/2013    Orientation RESPIRATION BLADDER Height & Weight     Self, Place  Normal Continent Weight: 223 lb 6.4 oz (101.3 kg) Height:  6\' 3"  (190.5 cm)  BEHAVIORAL SYMPTOMS/MOOD NEUROLOGICAL BOWEL NUTRITION STATUS   (none)  (none) Incontinent Diet ( Dysphagia level 3(mech soft) w/ Thin liquids; general aspiration precautions and feeding support d/t RUE Weakness )  AMBULATORY STATUS COMMUNICATION OF NEEDS Skin   Extensive Assist Verbally Surgical wounds (Left Back incision has penrose drain. )                       Personal Care Assistance Level of Assistance  Bathing, Feeding, Dressing Bathing Assistance: Limited assistance Feeding assistance: Independent Dressing Assistance: Limited assistance     Functional Limitations Info  Sight, Hearing, Speech Sight Info: Adequate Hearing Info: Impaired Speech Info: Adequate    SPECIAL CARE FACTORS FREQUENCY  PT (By licensed PT),  OT (By licensed OT)     PT Frequency:  (5) OT Frequency:  (5)            Contractures Contractures Info: Not present    Additional Factors Info  Code Status, Allergies Code Status Info:  (Full Code. ) Allergies Info:  (No Known Allergies. )           Current Medications (12/24/2016):  This is the current hospital active medication list Current Facility-Administered Medications  Medication Dose Route Frequency Provider Last Rate Last Dose  . acetaminophen (TYLENOL) tablet 650 mg  650 mg Oral Q6H PRN Demetrios Loll, MD   650 mg at 12/23/16 0003   Or  . acetaminophen (TYLENOL) suppository 650 mg  650 mg Rectal Q6H PRN Demetrios Loll, MD      . albuterol (PROVENTIL) (2.5 MG/3ML) 0.083% nebulizer solution 2.5 mg  2.5 mg Nebulization Q2H PRN Demetrios Loll, MD      . aspirin chewable tablet 81 mg  81 mg Oral Daily Gladstone Lighter, MD   81 mg at 12/24/16 1005  . atorvastatin (LIPITOR) tablet 40 mg  40 mg Oral q1800 Gladstone Lighter, MD   40 mg at 12/23/16 1744  . ceFAZolin (ANCEF) IVPB 2g/100 mL premix  2 g Intravenous Q8H [place] [facility], [place]   Stopped at 12/24/16 3976  . clopidogrel (PLAVIX) tablet 75 mg  75 mg Oral Daily Alexis Goodell, MD   75 mg at 12/24/16 1005  . enoxaparin (LOVENOX) injection 40 mg  40 mg  Subcutaneous Q24H Gladstone Lighter, MD   40 mg at 12/23/16 2204  . feeding supplement (ENSURE ENLIVE) (ENSURE ENLIVE) liquid 237 mL  237 mL Oral TID BM Demetrios Loll, MD   237 mL at 12/24/16 1346  . finasteride (PROSCAR) tablet 5 mg  5 mg Oral Daily Demetrios Loll, MD   5 mg at 12/24/16 1005  . HYDROcodone-acetaminophen (NORCO/VICODIN) 5-325 MG per tablet 1-2 tablet  1-2 tablet Oral Q4H PRN Demetrios Loll, MD   2 tablet at 12/24/16 [id number]  . lidocaine (LIDODERM) 5 % 1 patch  1 patch Transdermal Q24H Henreitta Leber, MD   1 patch at 12/23/16 1213  . lidocaine-EPINEPHrine (XYLOCAINE-EPINEPHrine) 1 %-1:200000 (PF) injection 30 mL  30 mL Intradermal Once Leonie Green, MD      . liver  oil-zinc oxide (DESITIN) 40 % ointment   Topical TID Lance Coon, MD      . loperamide (IMODIUM) capsule 2 mg  2 mg Oral Q6H PRN Demetrios Loll, MD   2 mg at 12/20/16 2217  . metoprolol tartrate (LOPRESSOR) tablet 12.5 mg  12.5 mg Oral BID Demetrios Loll, MD   12.5 mg at 12/24/16 1005  . ondansetron (ZOFRAN) tablet 4 mg  4 mg Oral Q6H PRN Demetrios Loll, MD       Or  . ondansetron [facility]) injection 4 mg  4 mg Intravenous Q6H PRN Demetrios Loll, MD      . opium-belladonna (B&O SUPPRETTES) 16.2-60 MG suppository 1 suppository  1 suppository Rectal BID PRN Demetrios Loll, MD   1 suppository at 12/19/16 1015  . senna-docusate (Senokot-S) tablet 1 tablet  1 tablet Oral QHS PRN Demetrios Loll, MD      . sodium chloride flush (NS) 0.9 % injection 10-40 mL  10-40 mL Intracatheter PRN Gladstone Lighter, MD      . tamsulosin (FLOMAX) capsule 0.4 mg  0.4 mg Oral Daily Demetrios Loll, MD   0.4 mg at 12/24/16 1005     Discharge Medications: Please see discharge summary for a list of discharge medications.  Relevant Imaging Results:  Relevant Lab Results:   Additional Information  (SSN: [id number])  Lacorey Brusca, Veronia Beets, LCSW

## 2016-12-24 NOTE — Progress Notes (Signed)
Changed pt's dressing on back. Wife stated that she wanted nurses to use flesh colored dressing and requested not to put ABD and gauze with paper tape anymore.

## 2016-12-24 NOTE — Evaluation (Signed)
Physical Therapy Evaluation Patient Details Name: William Kennedy MRN: [id number] DOB: 1940/06/28 Today's Date: 12/24/2016   History of Present Illness  presented to ER secondary to increasing confusion; admitted with sepsis related to UTI vs abscess on L upper back (s/p irrigation and debridement May, 2018).  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  Patient refusing invasive intervention or transfer to tertiary care center.  Orders for continued therapy received post-CVA; no restrictions noted (confirmed with Dr. Doy Mince)  Clinical Impression  Upon re-evaluation, patient with improved overall alertness and orientation.  Does require increased time for processing and task initiation; very poor insight into current deficits and overall health condition.  Now presents with significant R hemiparesis. Difficulty with motor planning (appropriate initiation/termination) of muscle activity in R UE > LE with significant tone with exertion (UE).  Currently requiring max assist +2 for supine/sit.  Difficult to assess full indep ability in unsupported sitting due to restlessness and constant attempts at return to bed once upright. Unsafe/unable to attempt OOB activities at this time. Would benefit from skilled PT to address above deficits and promote optimal return to PLOF; recommend transition to STR upon discharge from acute hospitalization.     Follow Up Recommendations SNF    Equipment Recommendations  Rolling walker with 5" wheels;Hospital bed    Recommendations for Other Services       Precautions / Restrictions Precautions Precautions: Fall Restrictions Weight Bearing Restrictions: No      Mobility  Bed Mobility Overal bed mobility: Needs Assistance Bed Mobility: Supine to Sit;Sit to Supine Rolling: Max assist;+2 for physical assistance   Supine to sit: Max  assist;+2 for physical assistance     General bed mobility comments: act assist from therapist to initiate movement of all extremities  Transfers                 General transfer comment: unsafe/unable  Ambulation/Gait             General Gait Details: unsafe/unable  Stairs            Wheelchair Mobility    Modified Rankin (Stroke Patients Only)       Balance Overall balance assessment: Needs assistance Sitting-balance support: No upper extremity supported;Feet supported Sitting balance-Leahy Scale: Poor Sitting balance - Comments: patient generally restless in unsupported sitting position, reporting discomfort in peri-area.  Restless, constantly attempting to reposition and return to supine.  Difficulty to assess true ability due to limited effort with specific task.                                     Pertinent Vitals/Pain Pain Assessment: Faces Faces Pain Scale: Hurts little more Pain Location: R wrist Pain Descriptors / Indicators: Grimacing Pain Intervention(s): Limited activity within patient's tolerance;Monitored during session;Repositioned    Home Living Family/patient expects to be discharged to:: Private residence Living Arrangements: Spouse/significant other Available Help at Discharge: Family;Available 24 hours/day Type of Home: House Home Access: Stairs to enter Entrance Stairs-Rails: Right;Left;Can reach both Entrance Stairs-Number of Steps: 2 Home Layout: One level        Prior Function           Comments: At baseline, mod indep with ADLs and mobility; however, since recent illness/infection/hospitalizations, has experienced progressive functional decline, requiring use of RW and +1  assist for all ADLs/mobility  Since recent admission (approx 2 weeks prior), has been completely bedbound, managing all ADLs, toileting, etc from bed level per wife..     Hand Dominance   Dominant Hand: Right    Extremity/Trunk  Assessment   Upper Extremity Assessment Upper Extremity Assessment:  (R shoulder and elbow generally 3-/5, wrist 0/5 (guarded due to pain), finger flex/ext 0/5, thumb flex/ext 2/5; significant flexor tone throughout R LE with exertion (2+ to 3/4).  Generalized paresthesia throughout R UE)    Lower Extremity Assessment Lower Extremity Assessment:  (R hip and knee grossly 2+ to 3-/5, ankle 2-/5.  Reports generalized paresthesia throughout R LE.)       Communication   Communication: No difficulties  Cognition Arousal/Alertness: Awake/alert Behavior During Therapy: WFL for tasks assessed/performed Overall Cognitive Status: Difficult to assess                                 General Comments: cognitive status fluctuates within session; frequent redirection to task required; increased time for processing and task initiation      General Comments      Exercises Other Exercises Other Exercises: Supine LE therex, 1x5-8, act assist ROM for muscular strength/flexibility.  Continues with positive Babinski, multi-beat clous R ankle.   Assessment/Plan    PT Assessment Patient needs continued PT services  PT Problem List Decreased strength;Decreased range of motion;Decreased activity tolerance;Decreased balance;Decreased mobility;Decreased coordination;Decreased safety awareness;Cardiopulmonary status limiting activity;Obesity;Decreased skin integrity;Pain;Decreased cognition;Decreased knowledge of use of DME;Decreased knowledge of precautions;Impaired tone       PT Treatment Interventions DME instruction;Gait training;Stair training;Functional mobility training;Therapeutic activities;Therapeutic exercise;Balance training;Neuromuscular re-education;Patient/family education;Cognitive remediation    PT Goals (Current goals can be found in the Care Plan section)  Acute Rehab PT Goals Patient Stated Goal: to get strength back PT Goal Formulation: With patient/family Time For Goal  Achievement: 01/07/17 Potential to Achieve Goals: Fair    Frequency Min 2X/week   Barriers to discharge Inaccessible home environment;Decreased caregiver support      Co-evaluation               AM-PAC PT "6 Clicks" Daily Activity  Outcome Measure Difficulty turning over in bed (including adjusting bedclothes, sheets and blankets)?: Total Difficulty moving from lying on back to sitting on the side of the bed? : Total Difficulty sitting down on and standing up from a chair with arms (e.g., wheelchair, bedside commode, etc,.)?: Total Help needed moving to and from a bed to chair (including a wheelchair)?: Total Help needed walking in hospital room?: Total Help needed climbing 3-5 steps with a railing? : Total 6 Click Score: 6    End of Session   Activity Tolerance: Patient limited by fatigue;Patient limited by pain Patient left: in bed;with bed alarm set;with call bell/phone within reach;with family/visitor present Nurse Communication: Mobility status PT Visit Diagnosis: Muscle weakness (generalized) (M62.81);Difficulty in walking, not elsewhere classified (R26.2);Hemiplegia and hemiparesis Hemiplegia - Right/Left: Right Hemiplegia - dominant/non-dominant: Dominant Hemiplegia - caused by: Cerebral infarction    Time: 2536-6440 PT Time Calculation (min) (ACUTE ONLY): 27 min   Charges:   PT Evaluation $PT Re-evaluation: 1 Re-eval PT Treatments $Therapeutic Exercise: 8-22 mins   PT G Codes:   PT G-Codes **NOT FOR INPATIENT CLASS** Functional Assessment Tool Used: AM-PAC 6 Clicks Basic Mobility Functional Limitation: Mobility: Walking and moving around Mobility: Walking and Moving Around Current Status (H4742): 100 percent impaired, limited or restricted  Mobility: Walking and Moving Around Goal Status [phone]): At least 20 percent but less than 40 percent impaired, limited or restricted    Amadea Keagy H. Owens Shark, PT, DPT, NCS 12/24/16, 10:40 PM [phone]

## 2016-12-25 LAB — CBC
HCT: 39.1 % — ABNORMAL LOW (ref 40.0–52.0)
Hemoglobin: 13.5 g/dL (ref 13.0–18.0)
MCH: 30.3 pg (ref 26.0–34.0)
MCHC: 34.5 g/dL (ref 32.0–36.0)
MCV: 87.6 fL (ref 80.0–100.0)
PLATELETS: 286 10*3/uL (ref 150–440)
RBC: 4.46 MIL/uL (ref 4.40–5.90)
RDW: 13.7 % (ref 11.5–14.5)
WBC: 20.1 10*3/uL — ABNORMAL HIGH (ref 3.8–10.6)

## 2016-12-25 LAB — BASIC METABOLIC PANEL
Anion gap: 8 (ref 5–15)
BUN: 7 mg/dL (ref 6–20)
CALCIUM: 9.5 mg/dL (ref 8.9–10.3)
CO2: 27 mmol/L (ref 22–32)
CREATININE: 0.78 mg/dL (ref 0.61–1.24)
Chloride: 101 mmol/L (ref 101–111)
GFR calc non Af Amer: >60 mL/min (ref >60)
GLUCOSE: 104 mg/dL — AB (ref 65–99)
Potassium: 3.3 mmol/L — ABNORMAL LOW (ref 3.5–5.1)
Sodium: 136 mmol/L (ref 135–145)

## 2016-12-25 NOTE — Progress Notes (Signed)
Physical Therapy Treatment Patient Details Name: William Kennedy MRN: [id number] DOB: 1940/10/31 Today's Date: 12/25/2016    History of Present Illness presented to ER secondary to increasing confusion; admitted with sepsis related to UTI vs abscess on L upper back (s/p irrigation and debridement May, 2018).  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  Patient refusing invasive intervention or transfer to tertiary care center.  Orders for continued therapy received post-CVA; no restrictions noted (confirmed with Dr. Doy Mince)    PT Comments    Pt agrees to session.  Pt yelling in pain when blankets are moved.  Stated pain is in head of penis from catheter.  Primary nurse in and discussed options with patient.  He is unable to tolerate LLE movement due to pain.  He does participate in BUE exercises as described below to maintain upper body strength for bed mobility, transfers and sitting balance needs.  Weakness remains in R side.  Unable to attempt mobility skills due to pain.   Follow Up Recommendations  SNF     Equipment Recommendations  Rolling walker with 5" wheels;Hospital bed    Recommendations for Other Services       Precautions / Restrictions Precautions Precautions: Fall Restrictions Weight Bearing Restrictions: No Other Position/Activity Restrictions: catheter pain     Mobility  Bed Mobility                  Transfers                    Ambulation/Gait                 Stairs            Wheelchair Mobility    Modified Rankin (Stroke Patients Only)       Balance                                            Cognition Arousal/Alertness: Awake/alert Behavior During Therapy: WFL for tasks assessed/performed Overall Cognitive Status: Within Functional Limits for tasks assessed                                         Exercises Other Exercises Other Exercises: supine AAROM RUE, AROM LUE, toletated very little BLE due to catheter irritation    General Comments        Pertinent Vitals/Pain Pain Assessment: Faces Faces Pain Scale: Hurts whole lot Pain Location: head of penis from catheter irritation.  General pain with AAROM throughout Pain Descriptors / Indicators: Grimacing Pain Intervention(s): Limited activity within patient's tolerance;RN gave pain meds during session;Monitored during session    Home Living                      Prior Function            PT Goals (current goals can now be found in the care plan section) Progress towards PT goals: Not progressing toward goals - comment    Frequency    Min 2X/week      PT Plan Current plan remains appropriate    Co-evaluation  AM-PAC PT "6 Clicks" Daily Activity  Outcome Measure  Difficulty turning over in bed (including adjusting bedclothes, sheets and blankets)?: Total Difficulty moving from lying on back to sitting on the side of the bed? : Total Difficulty sitting down on and standing up from a chair with arms (e.g., wheelchair, bedside commode, etc,.)?: Total Help needed moving to and from a bed to chair (including a wheelchair)?: Total Help needed walking in hospital room?: Total Help needed climbing 3-5 steps with a railing? : Total 6 Click Score: 6    End of Session   Activity Tolerance: Patient limited by pain Patient left: in bed;with bed alarm set;with call bell/phone within reach;with family/visitor present;with nursing/sitter in room   Hemiplegia - Right/Left: Right Hemiplegia - dominant/non-dominant: Dominant Hemiplegia - caused by: Cerebral infarction Pain - Right/Left: Left Pain - part of body: Shoulder     Time: 1694-5038 PT Time Calculation (min) (ACUTE ONLY): 18 min  Charges:  $Therapeutic Exercise: 8-22 mins                     G Codes:       Chesley Noon, PTA 12/25/16, 10:06 AM

## 2016-12-25 NOTE — Progress Notes (Signed)
Pt requests foley to be removed. MD paged, orders placed for removal of foley cath and bladder scan. Pt states he wants to see Urologist before he will allow removal of foley cath. Will notify MD. Pt refused removal of foley at this time.

## 2016-12-25 NOTE — Progress Notes (Signed)
[facility] [place] NAME: William Kennedy    MR#:  [id number]  DATE OF BIRTH:  09/04/1940  SUBJECTIVE: No worsening of weakness on the right side. No shortness of breath. No cough. Alert, awake, oriented. Patient has no back pain.   CHIEF COMPLAINT:   Chief Complaint  Patient presents with  . Altered Mental Status   - refused labs this AM, prominent left facial droop and right sided weakness - sleepy this AM  REVIEW OF SYSTEMS:  Review of Systems  Constitutional: Positive for malaise/fatigue. Negative for chills and fever.  HENT: Negative for congestion, ear discharge, hearing loss and nosebleeds.   Eyes: Negative for blurred vision and double vision.  Respiratory: Negative for cough, shortness of breath and wheezing.   Cardiovascular: Negative for chest pain, palpitations and leg swelling.  Gastrointestinal: Negative for abdominal pain, constipation, diarrhea, nausea and vomiting.  Genitourinary: Negative for dysuria.  Musculoskeletal: Positive for joint pain and myalgias. Negative for back pain.  Neurological: Positive for sensory change and focal weakness. Negative for dizziness, speech change, seizures and headaches.  Psychiatric/Behavioral: Negative for depression.    DRUG ALLERGIES:  No Known Allergies  VITALS:  Blood pressure (!) 154/78, pulse 98, temperature 97.6 F (36.4 C), temperature source Oral, resp. rate 20, height 6\' 3"  (1.905 m), weight 101.3 kg (223 lb 6.4 oz), SpO2 96 %.  PHYSICAL EXAMINATION:  Physical Exam  GENERAL:  76 y.o.-year-old Elderly patient lying in the bed, sleepy today EYES: Pupils equal, round, reactive to light and accommodation. No scleral icterus. Extraocular muscles intact.  HEENT: Head atraumatic, normocephalic. Oropharynx and nasopharynx clear.  NECK:  Supple, no jugular venous distention. No thyroid enlargement, no tenderness.  LUNGS: Normal breath sounds bilaterally, no wheezing,  rales,rhonchi or crepitation. No use of accessory muscles of respiration.  CARDIOVASCULAR: S1, S2 normal. No  rubs, or gallops. 2/6 systolic murmur is present ABDOMEN: Soft, nontender, nondistended. Bowel sounds present. No organomegaly or mass.  EXTREMITIES: No pedal edema, cyanosis, or clubbing.  On the left subscapular region on the upper back there is hyperpigmentation due to recent procedure, a drain left in noted, there is a firm indurated tender areas noted around the dressing. NEUROLOGIC: Cranial nerves II through XII are intact. Left facial droop noted. - Right upper extremity strength is 1/5, with minimal right hand grip, and right lower extremity strength is 1/5 at this time. Sensation is intact but has paresthesias on the right upper arm. Left upper and lower extremity strength that at baseline 5/5 Sensation intact. Gait not checked. PSYCHIATRIC: The patient is sleepy, arousable, following simple commands  SKIN: No obvious rash, lesion, or ulcer.    LABORATORY PANEL:   CBC  Recent Labs Lab 12/22/16 0549  WBC 15.8*  HGB 12.4*  HCT 36.0*  PLT 268   ------------------------------------------------------------------------------------------------------------------  Chemistries   Recent Labs Lab 12/18/16 1235  12/23/16 0333  NA 138  < > 141  K 3.8  < > 3.4*  CL 103  < > 107  CO2 26  < > 27  GLUCOSE 113*  < > 110*  BUN 14  < > 9  CREATININE 1.07  < > 0.80  CALCIUM 10.0  < > 9.1  AST 47*  --   --   ALT 42  --   --   ALKPHOS 101  --   --   BILITOT 0.7  --   --   < > = values in this  interval not displayed. ------------------------------------------------------------------------------------------------------------------  Cardiac Enzymes  Recent Labs Lab 12/18/16 1235  TROPONINI <0.03   ------------------------------------------------------------------------------------------------------------------  RADIOLOGY:  Ct Head Wo Contrast  Result Date:  12/24/2016 CLINICAL DATA:  77 year old male with left facial droop, right arm and leg weakness. Left MCA and PCA territory infarcts suspected on brain MRI yesterday. EXAM: CT HEAD WITHOUT CONTRAST TECHNIQUE: Contiguous axial images were obtained from the base of the skull through the vertex without intravenous contrast. COMPARISON:  Brain MRI 12/23/2016, head CT 12/23/2016. FINDINGS: Brain: Acute on chronic left lateral occipital evolving infarct and and subtle acute superior left MCA cortical and white matter infarct corresponding to the MRI findings yesterday. No associated hemorrhage or mass effect. Minimal increased hypodensity on CT compared to 12/23/2016. Stable gray-white matter differentiation elsewhere with confluent bilateral chronic white matter hypodensity. No intracranial mass effect. Stable ventricle size and configuration. Vascular: Calcified atherosclerosis at the skull base. Skull: No acute osseous abnormality identified. Sinuses/Orbits: stable and well pneumatized aside from bubbly opacity in the left sphenoid. Other: No acute orbit or scalp soft tissue findings. IMPRESSION: 1. 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 No associated hemorrhage or mass effect. 2. No new intracranial abnormality. Electronically Signed   By: Genevie Ann M.D.   On: 12/24/2016 12:20   Mr [name] Head Wo Contrast  Addendum Date: 12/23/2016   ADDENDUM REPORT: 12/23/2016 11:20 ADDENDUM: Study discussed by telephone with Dr. Gladstone Lighter on 12/23/2016 at 1113 hours. Electronically Signed   By: Genevie Ann M.D.   On: 12/23/2016 11:20   Result Date: 12/23/2016 CLINICAL DATA:  76 year old male recently treated for sepsis. New onset right upper extremity weakness upon waking at 0300 hours. Weakness progressing to the right leg. Subsequent dense right side hemiplegia. EXAM: MRI HEAD WITHOUT CONTRAST MRA HEAD WITHOUT CONTRAST MRA NECK WITHOUT AND WITH CONTRAST TECHNIQUE: Multiplanar,  multiecho pulse sequences of the brain and surrounding structures were obtained without intravenous contrast. Angiographic images of the Circle of Willis were obtained using MRA technique without intravenous contrast. Angiographic images of the neck were obtained using MRA technique without and with intravenous contrast. Carotid stenosis measurements (when applicable) are obtained utilizing NASCET criteria, using the distal internal carotid diameter as the denominator. CONTRAST:  86mL MULTIHANCE GADOBENATE DIMEGLUMINE 529 MG/ML IV SOLN COMPARISON:  Head CT without contrast 0323 hours today. FINDINGS: MRI HEAD FINDINGS Brain: Patchy acute areas of restricted diffusion in the left hemisphere including the left superior frontal gyrus, motor strip, sensory strip, pre motor area, left centrum semiovale, and also in the left superior occipital lobe and occipital pole cortex. No contralateral right hemisphere or posterior fossa restricted diffusion identified. T2 and FLAIR hyperintensity in the affected areas with no associated hemorrhage or mass effect. Underlying inferior left parietal lobe cortical encephalomalacia and hemosiderin (series 14, image 15). Patchy and confluent bilateral superimposed cerebral white matter T2 and FLAIR hyperintensity. Small chronic microhemorrhage also in the right occipital lobe white matter. 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 Vascular: Absent flow void in the distal right vertebral artery, preserved distal left vertebral artery and basilar flow void. ICA siphon flow voids are preserved. Cavum septum pellucidum anatomic variant. No ventriculomegaly. No midline shift. Skull and upper cervical spine: Negative. Sinuses/Orbits: Negative aside from mild mucosal thickening and/or fluid in the left sphenoid sinus. Other: Trace right mastoid fluid. Visible internal auditory structures appear normal. Negative scalp soft tissues. MRA NECK FINDINGS Precontrast  time-of-flight images  demonstrate asymmetrically decreased antegrade flow signal in the cervical left ICA. Bilateral CCA flow signal appears fairly symmetric. Preserved right ICA flow signal. Flow signal in the left vertebral artery which appears dominant in the neck. Post-contrast MRA images reveal a radiographic string sign stenosis at the left ICA origin, and highly irregular contour of the left ICA bulb with additional mild stenosis at the distal bulb level. See series 26, image 52 and series 110, image 16. Despite these findings there is preserved enhancement of the left ICA to the skullbase and into the left ICA siphon. Only mild irregularity of the right CCA and cervical right ICA. Proximal subclavian arteries are patent but irregular. The right vertebral artery is occluded from its origin to the V3 segment with only faint enhancement in the V4 segment and at the right PICA 0 which may be retrograde. There is mild stenosis at the left vertebral artery origin. The left vertebral artery is otherwise normal to the vertebrobasilar junction. MRA HEAD FINDINGS Absent antegrade flow in the distal right vertebral artery. Preserved flow in the distal left vertebral artery and basilar. SCA and right PCA origins are patent. The left P1 segment is irregular and stenotic. Left posterior communicating artery appears to be the primary source of left PCA flow the left P2 segment is normal. Left P3 branches are attenuated. Right PCA branches have a more normal appearance. Right posterior communicating artery is diminutive or absent. Antegrade flow in the right ICA siphon to the right terminus with no stenosis. Patent right MCA and ACA origins. Decreased antegrade flow signal in the vertical petrous and proximal cavernous segment of the left ICA siphon (series 104, image 6), but the supraclinoid segment and left ICA to sinus are patent. Left MCA origin and M1 segment are patent with only mild irregularity. Left MCA bifurcation  is patent. No left MCA branch occlusion is identified. The left ACA origin is severely stenotic. Patent anterior communicating artery. A2 segments appear symmetric and within normal limits. Moderate stenosis at the right MCA origin, but visible right MCA branches are otherwise within normal limits. IMPRESSION: 1. 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 No associated hemorrhage or mass effect. 2. 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 Patent left MCA, and no left MCA branch occlusion identified. 3. Proximal left PCA P1 stenosis with left posterior communicating artery contribution to the left PCA, corresponding to the left PCA territory involvement. 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 Electronically Signed: By: Genevie Ann M.D. On: 12/23/2016 10:56   Mr [name] Neck W Wo Contrast  Addendum Date: 12/23/2016   ADDENDUM REPORT: 12/23/2016 11:20 ADDENDUM: Study discussed by telephone with Dr. Gladstone Lighter on 12/23/2016 at 1113 hours. Electronically Signed   By: Genevie Ann M.D.   On: 12/23/2016 11:20   Result Date: 12/23/2016 CLINICAL DATA:  77 year old male recently treated for sepsis. New onset right upper extremity weakness upon waking at 0300 hours. Weakness progressing to the right leg. Subsequent dense right side hemiplegia. EXAM: MRI HEAD WITHOUT CONTRAST MRA HEAD WITHOUT CONTRAST MRA NECK WITHOUT AND WITH CONTRAST TECHNIQUE: Multiplanar, multiecho pulse sequences of the brain and surrounding structures were obtained without intravenous contrast. Angiographic images of the Circle of Willis were obtained using MRA technique without intravenous contrast. Angiographic images of the neck were  obtained using MRA technique without and with intravenous contrast. Carotid stenosis measurements  (when applicable) are obtained utilizing NASCET criteria, using the distal internal carotid diameter as the denominator. CONTRAST:  21mL MULTIHANCE GADOBENATE DIMEGLUMINE 529 MG/ML IV SOLN COMPARISON:  Head CT without contrast 0323 hours today. FINDINGS: MRI HEAD FINDINGS Brain: Patchy acute areas of restricted diffusion in the left hemisphere including the left superior frontal gyrus, motor strip, sensory strip, pre motor area, left centrum semiovale, and also in the left superior occipital lobe and occipital pole cortex. No contralateral right hemisphere or posterior fossa restricted diffusion identified. T2 and FLAIR hyperintensity in the affected areas with no associated hemorrhage or mass effect. Underlying inferior left parietal lobe cortical encephalomalacia and hemosiderin (series 14, image 15). Patchy and confluent bilateral superimposed cerebral white matter T2 and FLAIR hyperintensity. Small chronic microhemorrhage also in the right occipital lobe white matter. 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 Vascular: Absent flow void in the distal right vertebral artery, preserved distal left vertebral artery and basilar flow void. ICA siphon flow voids are preserved. Cavum septum pellucidum anatomic variant. No ventriculomegaly. No midline shift. Skull and upper cervical spine: Negative. Sinuses/Orbits: Negative aside from mild mucosal thickening and/or fluid in the left sphenoid sinus. Other: Trace right mastoid fluid. Visible internal auditory structures appear normal. Negative scalp soft tissues. MRA NECK FINDINGS Precontrast time-of-flight images demonstrate asymmetrically decreased antegrade flow signal in the cervical left ICA. Bilateral CCA flow signal appears fairly symmetric. Preserved right ICA flow signal. Flow signal in the left vertebral artery which appears dominant in the neck. Post-contrast MRA images reveal a radiographic string sign stenosis at the left ICA origin,  and highly irregular contour of the left ICA bulb with additional mild stenosis at the distal bulb level. See series 26, image 52 and series 110, image 16. Despite these findings there is preserved enhancement of the left ICA to the skullbase and into the left ICA siphon. Only mild irregularity of the right CCA and cervical right ICA. Proximal subclavian arteries are patent but irregular. The right vertebral artery is occluded from its origin to the V3 segment with only faint enhancement in the V4 segment and at the right PICA 0 which may be retrograde. There is mild stenosis at the left vertebral artery origin. The left vertebral artery is otherwise normal to the vertebrobasilar junction. MRA HEAD FINDINGS Absent antegrade flow in the distal right vertebral artery. Preserved flow in the distal left vertebral artery and basilar. SCA and right PCA origins are patent. The left P1 segment is irregular and stenotic. Left posterior communicating artery appears to be the primary source of left PCA flow the left P2 segment is normal. Left P3 branches are attenuated. Right PCA branches have a more normal appearance. Right posterior communicating artery is diminutive or absent. Antegrade flow in the right ICA siphon to the right terminus with no stenosis. Patent right MCA and ACA origins. Decreased antegrade flow signal in the vertical petrous and proximal cavernous segment of the left ICA siphon (series 104, image 6), but the supraclinoid segment and left ICA to sinus are patent. Left MCA origin and M1 segment are patent with only mild irregularity. Left MCA bifurcation is patent. No left MCA branch occlusion is identified. The left ACA origin is severely stenotic. Patent anterior communicating artery. A2 segments appear symmetric and within normal limits. Moderate stenosis at the right MCA origin, but visible right MCA branches are otherwise within normal limits. IMPRESSION: 1. Scattered  acute infarcts in the left MCA and  PCA territory. No associated hemorrhage or mass effect. 2. 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 Patent left MCA, and no left MCA branch occlusion identified. 3. Proximal left PCA P1 stenosis with left posterior communicating artery contribution to the left PCA, corresponding to the left PCA territory involvement. 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 Electronically Signed: By: Genevie Ann M.D. On: 12/23/2016 10:56   Mr Brain Wo Contrast  Addendum Date: 12/23/2016   ADDENDUM REPORT: 12/23/2016 11:20 ADDENDUM: Study discussed by telephone with Dr. Gladstone Lighter on 12/23/2016 at 1113 hours. Electronically Signed   By: Genevie Ann M.D.   On: 12/23/2016 11:20   Result Date: 12/23/2016 CLINICAL DATA:  76 year old male recently treated for sepsis. New onset right upper extremity weakness upon waking at 0300 hours. Weakness progressing to the right leg. Subsequent dense right side hemiplegia. EXAM: MRI HEAD WITHOUT CONTRAST MRA HEAD WITHOUT CONTRAST MRA NECK WITHOUT AND WITH CONTRAST TECHNIQUE: Multiplanar, multiecho pulse sequences of the brain and surrounding structures were obtained without intravenous contrast. Angiographic images of the Circle of Willis were obtained using MRA technique without intravenous contrast. Angiographic images of the neck were obtained using MRA technique without and with intravenous contrast. Carotid stenosis measurements (when applicable) are obtained utilizing NASCET criteria, using the distal internal carotid diameter as the denominator. CONTRAST:  51mL MULTIHANCE GADOBENATE DIMEGLUMINE 529 MG/ML IV SOLN COMPARISON:  Head CT without contrast 0323 hours today. FINDINGS: MRI HEAD FINDINGS Brain: Patchy acute areas of restricted diffusion in the left hemisphere including the left  superior frontal gyrus, motor strip, sensory strip, pre motor area, left centrum semiovale, and also in the left superior occipital lobe and occipital pole cortex. No contralateral right hemisphere or posterior fossa restricted diffusion identified. T2 and FLAIR hyperintensity in the affected areas with no associated hemorrhage or mass effect. Underlying inferior left parietal lobe cortical encephalomalacia and hemosiderin (series 14, image 15). Patchy and confluent bilateral superimposed cerebral white matter T2 and FLAIR hyperintensity. Small chronic microhemorrhage also in the right occipital lobe white matter. 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 Vascular: Absent flow void in the distal right vertebral artery, preserved distal left vertebral artery and basilar flow void. ICA siphon flow voids are preserved. Cavum septum pellucidum anatomic variant. No ventriculomegaly. No midline shift. Skull and upper cervical spine: Negative. Sinuses/Orbits: Negative aside from mild mucosal thickening and/or fluid in the left sphenoid sinus. Other: Trace right mastoid fluid. Visible internal auditory structures appear normal. Negative scalp soft tissues. MRA NECK FINDINGS Precontrast time-of-flight images demonstrate asymmetrically decreased antegrade flow signal in the cervical left ICA. Bilateral CCA flow signal appears fairly symmetric. Preserved right ICA flow signal. Flow signal in the left vertebral artery which appears dominant in the neck. Post-contrast MRA images reveal a radiographic string sign stenosis at the left ICA origin, and highly irregular contour of the left ICA bulb with additional mild stenosis at the distal bulb level. See series 26, image 52 and series 110, image 16. Despite these findings there is preserved enhancement of the left ICA to the skullbase and into the left ICA siphon. Only mild irregularity of the right CCA and cervical right ICA. Proximal subclavian arteries  are patent but irregular. The right vertebral artery is occluded from its  origin to the V3 segment with only faint enhancement in the V4 segment and at the right PICA 0 which may be retrograde. There is mild stenosis at the left vertebral artery origin. The left vertebral artery is otherwise normal to the vertebrobasilar junction. MRA HEAD FINDINGS Absent antegrade flow in the distal right vertebral artery. Preserved flow in the distal left vertebral artery and basilar. SCA and right PCA origins are patent. The left P1 segment is irregular and stenotic. Left posterior communicating artery appears to be the primary source of left PCA flow the left P2 segment is normal. Left P3 branches are attenuated. Right PCA branches have a more normal appearance. Right posterior communicating artery is diminutive or absent. Antegrade flow in the right ICA siphon to the right terminus with no stenosis. Patent right MCA and ACA origins. Decreased antegrade flow signal in the vertical petrous and proximal cavernous segment of the left ICA siphon (series 104, image 6), but the supraclinoid segment and left ICA to sinus are patent. Left MCA origin and M1 segment are patent with only mild irregularity. Left MCA bifurcation is patent. No left MCA branch occlusion is identified. The left ACA origin is severely stenotic. Patent anterior communicating artery. A2 segments appear symmetric and within normal limits. Moderate stenosis at the right MCA origin, but visible right MCA branches are otherwise within normal limits. IMPRESSION: 1. 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 No associated hemorrhage or mass effect. 2. 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 Patent left MCA, and no left MCA branch occlusion identified. 3. Proximal left PCA P1 stenosis with left posterior communicating artery contribution  to the left PCA, corresponding to the left PCA territory involvement. 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 Electronically Signed: By: Genevie Ann M.D. On: 12/23/2016 10:56    EKG:   Orders placed or performed during the hospital encounter of 12/18/16  . ED EKG 12-Lead  . ED EKG 12-Lead  . EKG 12-Lead  . EKG 12-Lead  . EKG  . EKG 12-Lead  . EKG 12-Lead  . EKG 12-Lead  . EKG 12-Lead    ASSESSMENT AND PLAN:   76 year old male with past medical history significant for CLL, hypertension, BPH with chronic indwelling Foley catheter, subacute left upper back abscess presents to hospital secondary to altered mental status.  #1 acute left hemispheric CVA - MRI of the brain revealing scattered acute infarcts in left MCA and PCA territory. -MRI is positive for high-grade string sign stenosis at the left ICA origin and severe stenosis or thrombosis in the mid left ICA siphon. Chronic left PCA infarct noted, chronic right vertebral artery occlusion and severe stenosis of left ACA origin -Discussed with patient and wife about transferring to [facility] for consideration of either thrombectomy, however patient refused .wife wants to take him.. -Continue neuro checks, on aspirin and Plavix and statin -Echo did not reveal any source of thrombus. -Still high risk for worsening left ICA occlusion resulting in complete left hemispheric infarct would ultimately lead to cerebral edema. The risks explained to both patient and wife at bedside -Appreciate neurology consult  #2 sepsis-secondary to left upper back abscess, status post lancing in May 2018 for the first time  -Status post incision and drainage again this admission, cultures are negative so far.  On IV Ancef at this time. -Appreciate ID consult. Still has indurated area surrounding the drained region, continue to monitor. -Has a Penrose drain in  for more drainage -Continue pain medications as tolerated Continue Ancef for now, changed to Keflex at discharge,d/c PICC as per ID Surgery consult today to evaluate for Penrose drain in left upper back #3 BPH and chronic Foley catheter-no evidence of infection. Has chronic conization. -Denies any bladder spasms at this time. -Continue Flomax and finasteride DC Foley today, continue voiding trials today.  #4 CLL-white count is actually improving. Continue to monitor  #5. DVT prophylaxis-on subcutaneous heparin  Will need rehabilitation now with right-sided weakness. -PT recommends   All the records are reviewed and case discussed with Care Management/Social Workerr. Management plans discussed with the patient, family and they are in agreement.  CODE STATUS: Full code  TOTAL TIME TAKING CARE OF THIS PATIENT: 38 minutes.   POSSIBLE D/C IN 2-3 DAYS, DEPENDING ON CLINICAL CONDITION. D/w Anitra Lauth M.D on 12/25/2016 at 10:04 AM  Between 7am to 6pm - Pager - [phone]  After 6pm go to [email or web address] - password [email or web address] [facility]  Office  [phone]  CC: Primary care physician; [name], MD

## 2016-12-25 NOTE — Progress Notes (Signed)
Pt allowed labs to be drawn from PICC line, flushed and good blood return noted. Cap changed after line draw.

## 2016-12-25 NOTE — Progress Notes (Signed)
Occupational Therapy Treatment Patient Details Name: William Kennedy MRN: [id number] DOB: [birth year] Today's Date: 12/25/2016    History of present illness presented to ER secondary to increasing confusion; admitted with sepsis related to UTI vs abscess on L upper back (s/p irrigation and debridement May, 2018).  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  Patient refusing invasive intervention or transfer to tertiary care center.  Orders for continued therapy received post-CVA; no restrictions noted (confirmed with Dr. Doy Mince)   OT comments  Patient seen for RUE ROM, positioning, self care tasks and education for wife to assist with HEP.  Patient alert at times but becomes lethargic at times during session and requires redirection to task.  Patient complains of some pain with right UE movement.  Recommend wife keep ROM no greater than 90 degrees of shoulder flexion until patient is able to move more to incorporate scapular movement during exercises.  Increased active movement this date in shoulder, elbow and fingers but no wrist movement.  Patient was able to demonstrate spontaneous use of right hand to reach to nose attempting to scratch when nose was itching twice.  Continue towards goals to increase independence in daily tasks.   Follow Up Recommendations  SNF    Equipment Recommendations       Recommendations for Other Services      Precautions / Restrictions Precautions Precautions: Fall Restrictions Weight Bearing Restrictions: No Other Position/Activity Restrictions: catheter pain        Mobility Bed Mobility                  Transfers                      Balance                                           ADL either performed or assessed with clinical judgement   ADL Overall ADL's : Needs assistance/impaired      Grooming: Modified independent Grooming Details (indicate cue type and reason): use of left hand only, cannot remove his glasses with right hand but able to demonstrate elbow flexion to 90 with attempt         Upper Body Dressing : Total assistance   Lower Body Dressing: Total assistance                       Vision Baseline Vision/History: Wears glasses Wears Glasses: At all times Patient Visual Report: Blurring of vision Vision Assessment?: Vision impaired- to be further tested in functional context   Perception     Praxis      Cognition Arousal/Alertness: Awake/alert Behavior During Therapy: WFL for tasks assessed/performed Overall Cognitive Status: Within Functional Limits for tasks assessed Area of Impairment: Safety/judgement;Following commands                       Following Commands: Follows one step commands with increased time Safety/Judgement: Decreased awareness of safety;Decreased awareness of deficits     General Comments: cognitive status fluctuates within session; frequent redirection to task required; increased time for processing and task initiation        Exercises Other Exercises Other Exercises: Patient seen this date for PROM to RUE followed  by Sinclair Ship and attempts to facilitate movement, patient able to demonstrate elbow flexion to 90 degrees, elbow extension to 0, no active wrist movement, initiation of finger flexion in ring and small fingers on right, decreased finger extension.  Patient able to hold right shoulder at 45 degrees of flexion and lower slowly with control.  Educated wife on positioning and ROM exercises and she demonstrates understanding.  RUE positioned in elevation on pillows and slight ABD.Marland Kitchen   Shoulder Instructions       General Comments      Pertinent Vitals/ Pain       Pain Assessment: Faces Pain Score: 5  Faces Pain Scale: Hurts whole lot Pain Location: RUE with ROM Pain Descriptors / Indicators:  Grimacing Pain Intervention(s): Limited activity within patient's tolerance;RN gave pain meds during session;Monitored during session  Home Living                                          Prior Functioning/Environment              Frequency  Min 2X/week        Progress Toward Goals  OT Goals(current goals can now be found in the care plan section)  Progress towards OT goals: Progressing toward goals  Acute Rehab OT Goals Patient Stated Goal: to get strength back OT Goal Formulation: With patient Potential to Achieve Goals: Good  Plan      Co-evaluation                 AM-PAC PT "6 Clicks" Daily Activity     Outcome Measure   Help from another person eating meals?: A Lot Help from another person taking care of personal grooming?: A Lot Help from another person toileting, which includes using toliet, bedpan, or urinal?: Total Help from another person bathing (including washing, rinsing, drying)?: Total Help from another person to put on and taking off regular upper body clothing?: Total Help from another person to put on and taking off regular lower body clothing?: Total 6 Click Score: 8    End of Session    OT Visit Diagnosis: Muscle weakness (generalized) (M62.81);Hemiplegia and hemiparesis;Pain Hemiplegia - Right/Left: Right Hemiplegia - dominant/non-dominant: Dominant Pain - Right/Left: Right Pain - part of body: Arm   Activity Tolerance Patient limited by pain;Patient limited by fatigue   Patient Left in bed;with call bell/phone within reach;with bed alarm set;with family/visitor present   Nurse Communication          Time: 2993-7169 OT Time Calculation (min): 31 min  Charges: OT General Charges $OT Visit: 1 Procedure OT Treatments $Self Care/Home Management : 8-22 mins $Therapeutic Exercise: 8-22 mins  Garlon Tuggle T Atley [place], OTR/L, CLT    Camdyn Beske 12/25/2016, 10:34 AM

## 2016-12-25 NOTE — Progress Notes (Addendum)
Pt refused lab draw.  Offered to call lab to come and draw it and pt still refused.  Pt stated "I just don't want it done." Lab notified.

## 2016-12-25 NOTE — Progress Notes (Signed)
Dressing changed to left upper back/shoulder. Penrose drain sill in place, copious drainage continues. Pt adamant that he does not want paper tape on dressing. Placed foam Allevyn dressing with additional gauze for absorption.

## 2016-12-26 DIAGNOSIS — Z466 Encounter for fitting and adjustment of urinary device: Secondary | ICD-10-CM

## 2016-12-26 DIAGNOSIS — R338 Other retention of urine: Secondary | ICD-10-CM

## 2016-12-26 DIAGNOSIS — N401 Enlarged prostate with lower urinary tract symptoms: Secondary | ICD-10-CM

## 2016-12-26 LAB — AEROBIC/ANAEROBIC CULTURE (SURGICAL/DEEP WOUND)

## 2016-12-26 LAB — HEMOGLOBIN A1C
HEMOGLOBIN A1C: 4.8 % (ref 4.8–5.6)
MEAN PLASMA GLUCOSE: 91 mg/dL

## 2016-12-26 LAB — AEROBIC/ANAEROBIC CULTURE W GRAM STAIN (SURGICAL/DEEP WOUND): Culture: NO GROWTH

## 2016-12-26 NOTE — Clinical Social Work Note (Signed)
CSW reviewed chart. This patient and his family are continuing to refuse STR. CSW will continue to follow should this decision change.  Santiago Bumpers, MSW, Latanya Presser [phone]

## 2016-12-26 NOTE — Consult Note (Signed)
Subjective: CC: Urinary retention.  Hx: William Kennedy is a 76 yo WM who I was asked to see in consultation by Dr. Vianne Bulls for foley management.   He had ESWL in June followed by left ureteroscopy on 11/06/16.   He was admitted with sepsis in July and apparently was found to be in retention.  A foley catheter was placed and he has been on finasteride and tamsulosin.   The foley was last changed in the ER 8 days ago.   He is tolerating the foley well but was readmitted for sepsis with a recurrent abscess on his back.   He had a CT on about 7/10 that showed no hydronephrosis.   ROS:  Review of Systems  Constitutional: Positive for malaise/fatigue.  Genitourinary: Negative for flank pain.    No Known Allergies  Past Medical History:  Diagnosis Date  . Abscess    shoulder  . Leukemia [facility])     Past Surgical History:  Procedure Laterality Date  . BACK SURGERY    . CYSTOSCOPY WITH URETEROSCOPY AND STENT PLACEMENT Left 11/06/2016   Procedure: CYSTOSCOPY WITH URETEROSCOPY AND STENT PLACEMENT retrograde pyelogram,holmium laser;  Surgeon: Irine Seal, MD;  Location: [facility] ORS;  Service: Urology;  Laterality: Left;  . EXTRACORPOREAL SHOCK WAVE LITHOTRIPSY Left 11/04/2016   Procedure: EXTRACORPOREAL SHOCK WAVE LITHOTRIPSY (ESWL);  Surgeon: Hollice Espy, MD;  Location: [facility] ORS;  Service: Urology;  Laterality: Left;    Social History   Social History  . Marital status: Married    Spouse name: N/A  . Number of children: N/A  . Years of education: N/A   Occupational History  . Not on file.   Social History Main Topics  . Smoking status: Former Research scientist (life sciences)  . Smokeless tobacco: Former Systems developer  . Alcohol use No  . Drug use: No  . Sexual activity: Yes    Partners: Female   Other Topics Concern  . Not on file   Social History Narrative  . No narrative on file    Family History  Problem Relation Age of Onset  . Diabetes Mother   . Cancer Father     Anti-infectives: Anti-infectives    Start     Dose/Rate Route Frequency Ordered Stop   12/20/16 1800  ceFAZolin (ANCEF) IVPB 2g/100 mL premix     2 g 200 mL/hr over 30 Minutes Intravenous Every 8 hours 12/20/16 1603     12/20/16 1345  vancomycin (VANCOCIN) 50 mg/mL oral solution 125 mg  Status:  Discontinued     125 mg Oral Every 6 hours 12/20/16 1335 12/20/16 1538   12/18/16 2200  piperacillin-tazobactam (ZOSYN) IVPB 3.375 g  Status:  Discontinued     3.375 g 12.5 mL/hr over 240 Minutes Intravenous Every 8 hours 12/18/16 1504 12/20/16 1538   12/18/16 2200  vancomycin (VANCOCIN) 50 mg/mL oral solution 125 mg  Status:  Discontinued     125 mg Oral 4 times daily 12/18/16 1923 12/19/16 1407   12/18/16 2000  vancomycin (VANCOCIN) 1,500 mg in sodium chloride 0.9 % 500 mL IVPB  Status:  Discontinued     1,500 mg 250 mL/hr over 120 Minutes Intravenous Every 12 hours 12/18/16 1849 12/19/16 1407   12/18/16 1900  piperacillin-tazobactam (ZOSYN) IVPB 3.375 g  Status:  Discontinued     3.375 g 12.5 mL/hr over 240 Minutes Intravenous Every 8 hours 12/18/16 1850 12/18/16 1851   12/18/16 1800  vancomycin (VANCOCIN) 125 MG capsule 125 mg  Status:  Discontinued  125 mg Oral 4 times daily 12/18/16 1739 12/18/16 1750   12/18/16 1800  vancomycin (VANCOCIN) 50 mg/mL oral solution 125 mg  Status:  Discontinued     125 mg Oral 4 times daily 12/18/16 1751 12/18/16 1923   12/18/16 1245  piperacillin-tazobactam (ZOSYN) IVPB 3.375 g     3.375 g 100 mL/hr over 30 Minutes Intravenous  Once 12/18/16 1242 12/18/16 1513   12/18/16 1245  vancomycin (VANCOCIN) IVPB 1000 mg/200 mL premix     1,000 mg 200 mL/hr over 60 Minutes Intravenous  Once 12/18/16 1242 12/18/16 1513      Current Facility-Administered Medications  Medication Dose Route Frequency Provider Last Rate Last Dose  . acetaminophen (TYLENOL) tablet 650 mg  650 mg Oral Q6H PRN Demetrios Loll, MD   650 mg at 12/23/16 0003   Or  . acetaminophen (TYLENOL) suppository 650 mg  650 mg Rectal Q6H  PRN Demetrios Loll, MD      . albuterol (PROVENTIL) (2.5 MG/3ML) 0.083% nebulizer solution 2.5 mg  2.5 mg Nebulization Q2H PRN Demetrios Loll, MD      . aspirin chewable tablet 81 mg  81 mg Oral Daily Gladstone Lighter, MD   81 mg at 12/26/16 0857  . atorvastatin (LIPITOR) tablet 40 mg  40 mg Oral q1800 Gladstone Lighter, MD   40 mg at 12/25/16 1733  . ceFAZolin (ANCEF) IVPB 2g/100 mL premix  2 g Intravenous Q8H Napoleon Form, Remy   Stopped at 12/26/16 6834  . clopidogrel (PLAVIX) tablet 75 mg  75 mg Oral Daily Alexis Goodell, MD   75 mg at 12/26/16 0857  . enoxaparin (LOVENOX) injection 40 mg  40 mg Subcutaneous Q24H Gladstone Lighter, MD   40 mg at 12/25/16 2115  . feeding supplement (ENSURE ENLIVE) (ENSURE ENLIVE) liquid 237 mL  237 mL Oral TID BM Demetrios Loll, MD   237 mL at 12/26/16 0857  . finasteride (PROSCAR) tablet 5 mg  5 mg Oral Daily Demetrios Loll, MD   5 mg at 12/26/16 0857  . HYDROcodone-acetaminophen (NORCO/VICODIN) 5-325 MG per tablet 1-2 tablet  1-2 tablet Oral Q4H PRN Demetrios Loll, MD   2 tablet at 12/25/16 1625  . lidocaine (LIDODERM) 5 % 1 patch  1 patch Transdermal Q24H Henreitta Leber, MD   1 patch at 12/25/16 1547  . lidocaine-EPINEPHrine (XYLOCAINE-EPINEPHrine) 1 %-1:200000 (PF) injection 30 mL  30 mL Intradermal Once Leonie Green, MD      . liver oil-zinc oxide (DESITIN) 40 % ointment   Topical TID Lance Coon, MD      . loperamide (IMODIUM) capsule 2 mg  2 mg Oral Q6H PRN Demetrios Loll, MD   2 mg at 12/20/16 2217  . metoprolol tartrate (LOPRESSOR) tablet 12.5 mg  12.5 mg Oral BID Demetrios Loll, MD   12.5 mg at 12/26/16 0857  . ondansetron (ZOFRAN) tablet 4 mg  4 mg Oral Q6H PRN Demetrios Loll, MD       Or  . ondansetron [facility]) injection 4 mg  4 mg Intravenous Q6H PRN Demetrios Loll, MD      . opium-belladonna (B&O SUPPRETTES) 16.2-60 MG suppository 1 suppository  1 suppository Rectal BID PRN Demetrios Loll, MD   1 suppository at 12/19/16 1015  . senna-docusate (Senokot-S) tablet 1  tablet  1 tablet Oral QHS PRN Demetrios Loll, MD      . sodium chloride flush (NS) 0.9 % injection 10-40 mL  10-40 mL Intracatheter PRN Gladstone Lighter, MD      .  tamsulosin (FLOMAX) capsule 0.4 mg  0.4 mg Oral Daily Demetrios Loll, MD   0.4 mg at 12/26/16 0857     Objective: Vital signs in last 24 hours: Temp:  [97.8 F (36.6 C)-98.8 F (37.1 C)] 98.7 F (37.1 C) (08/05 0602) Pulse Rate:  [86-100] 100 (08/05 0602) Resp:  [20] 20 (08/05 0602) BP: (93-159)/(50-74) 130/70 (08/05 0602) SpO2:  [94 %-98 %] 94 % (08/05 0602)  Intake/Output from previous day: 08/04 0701 - 08/05 0700 In: 76 [P.O.:120; IV Piggyback:300] Out: 3250 [Urine:3250] Intake/Output this shift: Total I/O In: 240 [P.O.:240] Out: -    Physical Exam  Constitutional: He is well-developed, well-nourished, and in no distress.  Genitourinary:  Genitourinary Comments: 56fr silastic foley draining clear urine.   Vitals reviewed.   Lab Results:   Recent Labs  12/25/16 0956  WBC 20.1*  HGB 13.5  HCT 39.1*  PLT 286   BMET  Recent Labs  12/25/16 0956  NA 136  K 3.3*  CL 101  CO2 27  GLUCOSE 104*  BUN 7  CREATININE 0.78  CALCIUM 9.5   PT/INR No results for input(s): LABPROT, INR in the last 72 hours. ABG No results for input(s): PHART, HCO3 in the last 72 hours.  Invalid input(s): PCO2, PO2  Studies/Results: Ct Head Wo Contrast  Result Date: 12/24/2016 CLINICAL DATA:  76 year old male with left facial droop, right arm and leg weakness. Left MCA and PCA territory infarcts suspected on brain MRI yesterday. EXAM: CT HEAD WITHOUT CONTRAST TECHNIQUE: Contiguous axial images were obtained from the base of the skull through the vertex without intravenous contrast. COMPARISON:  Brain MRI 12/23/2016, head CT 12/23/2016. FINDINGS: Brain: Acute on chronic left lateral occipital evolving infarct and and subtle acute superior left MCA cortical and white matter infarct corresponding to the MRI findings yesterday. No  associated hemorrhage or mass effect. Minimal increased hypodensity on CT compared to 12/23/2016. Stable gray-white matter differentiation elsewhere with confluent bilateral chronic white matter hypodensity. No intracranial mass effect. Stable ventricle size and configuration. Vascular: Calcified atherosclerosis at the skull base. Skull: No acute osseous abnormality identified. Sinuses/Orbits: stable and well pneumatized aside from bubbly opacity in the left sphenoid. Other: No acute orbit or scalp soft tissue findings. IMPRESSION: 1. 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 No associated hemorrhage or mass effect. 2. No new intracranial abnormality. Electronically Signed   By: Genevie Ann M.D.   On: 12/24/2016 12:20   CT films from 11/30/16 reviewed and discussed with patient and wife.   Urine culture from 12/18/16 is negative.   Assessment: Hx of BPH with Retention currently  Managed with foley drainage.    Continue finasteride and tamsulosin and I will notify Dr. Erlene Quan of his admission and have her decide on the timing of a voiding trial.   Foley was exchanged 8 days ago and doesn't need to be replaced at this time.    CC: Dr. Epifanio Lesches     Irine Seal J 12/26/2016 [phone]

## 2016-12-26 NOTE — Progress Notes (Signed)
[place] [facility] [place] NAME: William Kennedy    MR#:  [id number]  DATE OF BIRTH:  11/28/1940  SUBJECTIVE: Refusing removal of PICC line, refusing blood work also refused voiding trials yesterday. Urology consult requested..   CHIEF COMPLAINT:   Chief Complaint  Patient presents with  . Altered Mental Status   -   REVIEW OF SYSTEMS:  Review of Systems  Constitutional: Positive for malaise/fatigue. Negative for chills and fever.  HENT: Negative for congestion, ear discharge, hearing loss and nosebleeds.   Eyes: Negative for blurred vision and double vision.  Respiratory: Negative for cough, shortness of breath and wheezing.   Cardiovascular: Negative for chest pain, palpitations and leg swelling.  Gastrointestinal: Negative for abdominal pain, constipation, diarrhea, nausea and vomiting.  Genitourinary: Negative for dysuria.  Musculoskeletal: Positive for joint pain and myalgias. Negative for back pain.  Neurological: Positive for sensory change and focal weakness. Negative for dizziness, speech change, seizures and headaches.  Psychiatric/Behavioral: Negative for depression.    DRUG ALLERGIES:  No Known Allergies  VITALS:  Blood pressure 130/70, pulse 100, temperature 98.7 F (37.1 C), temperature source Oral, resp. rate 20, height 6\' 3"  (1.905 m), weight 101.3 kg (223 lb 6.4 oz), SpO2 94 %.  PHYSICAL EXAMINATION:  Physical Exam  GENERAL:  76 y.o.-year-old Elderly patient lying in the bed, sleepy today EYES: Pupils equal, round, reactive to light and accommodation. No scleral icterus. Extraocular muscles intact.  HEENT: Head atraumatic, normocephalic. Oropharynx and nasopharynx clear.  NECK:  Supple, no jugular venous distention. No thyroid enlargement, no tenderness.  LUNGS: Normal breath sounds bilaterally, no wheezing, rales,rhonchi or crepitation. No use of accessory muscles of respiration.  CARDIOVASCULAR: S1, S2 normal. No   rubs, or gallops. 2/6 systolic murmur is present ABDOMEN: Soft, nontender, nondistended. Bowel sounds present. No organomegaly or mass.  EXTREMITIES: No pedal edema, cyanosis, or clubbing.  On the left subscapular region on the upper back there is hyperpigmentation due to recent procedure, a drain left in noted, there is a firm indurated tender areas noted around the dressing. NEUROLOGIC: Cranial nerves II through XII are intact. Left facial droop noted. - Right upper extremity strength is 1/5, with minimal right hand grip, and right lower extremity strength is 1/5 at this time. Sensation is intact but has paresthesias on the right upper arm. Left upper and lower extremity strength that at baseline 5/5 Sensation intact. Gait not checked. PSYCHIATRIC: The patient is sleepy, arousable, following simple commands  SKIN: No obvious rash, lesion, or ulcer.    LABORATORY PANEL:   CBC  Recent Labs Lab 12/25/16 0956  WBC 20.1*  HGB 13.5  HCT 39.1*  PLT 286   ------------------------------------------------------------------------------------------------------------------  Chemistries   Recent Labs Lab 12/25/16 0956  NA 136  K 3.3*  CL 101  CO2 27  GLUCOSE 104*  BUN 7  CREATININE 0.78  CALCIUM 9.5   ------------------------------------------------------------------------------------------------------------------  Cardiac Enzymes No results for input(s): TROPONINI in the last 168 hours. ------------------------------------------------------------------------------------------------------------------  RADIOLOGY:  Ct Head Wo Contrast  Result Date: 12/24/2016 CLINICAL DATA:  76 year old male with left facial droop, right arm and leg weakness. Left MCA and PCA territory infarcts suspected on brain MRI yesterday. EXAM: CT HEAD WITHOUT CONTRAST TECHNIQUE: Contiguous axial images were obtained from the base of the skull through the vertex without intravenous contrast. COMPARISON:  Brain  MRI 12/23/2016, head CT 12/23/2016. FINDINGS: Brain: Acute on chronic left lateral occipital evolving infarct and and subtle acute superior  left MCA cortical and white matter infarct corresponding to the MRI findings yesterday. No associated hemorrhage or mass effect. Minimal increased hypodensity on CT compared to 12/23/2016. Stable gray-white matter differentiation elsewhere with confluent bilateral chronic white matter hypodensity. No intracranial mass effect. Stable ventricle size and configuration. Vascular: Calcified atherosclerosis at the skull base. Skull: No acute osseous abnormality identified. Sinuses/Orbits: stable and well pneumatized aside from bubbly opacity in the left sphenoid. Other: No acute orbit or scalp soft tissue findings. IMPRESSION: 1. 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 No associated hemorrhage or mass effect. 2. No new intracranial abnormality. Electronically Signed   By: Genevie Ann M.D.   On: 12/24/2016 12:20    EKG:   Orders placed or performed during the hospital encounter of 12/18/16  . ED EKG 12-Lead  . ED EKG 12-Lead  . EKG 12-Lead  . EKG 12-Lead  . EKG  . EKG 12-Lead  . EKG 12-Lead  . EKG 12-Lead  . EKG 12-Lead    ASSESSMENT AND PLAN:   76 year old male with past medical history significant for CLL, hypertension, BPH with chronic indwelling Foley catheter, subacute left upper back abscess presents to hospital secondary to altered mental status.  #1 acute left hemispheric CVA - MRI of the brain revealing scattered acute infarcts in left MCA and PCA territory. -MRI is positive for high-grade string sign stenosis at the left ICA origin and severe stenosis or thrombosis in the mid left ICA siphon. Chronic left PCA infarct noted, chronic right vertebral artery occlusion and severe stenosis of left ACA origin -Discussed with patient and wife about transferring to [facility] for consideration of either  thrombectomy, however patient refused .wife wants to take him.. -Continue neuro checks, on aspirin and Plavix and statin -Echo did not reveal any source of thrombus. -Still high risk for worsening left ICA occlusion resulting in complete left hemispheric infarct would ultimately lead to cerebral edema. The risks explained to both patient and wife at bedside -Appreciate neurology consult  #2 sepsis-secondary to left upper back abscess, status post lancing in May 2018 for the first time  -Status post incision and drainage again this admission, cultures are negative so far. On IV Ancef at this time. -Appreciate ID consult. Still has indurated area surrounding the drained region, continue to monitor. -Has a Penrose drain in for more drainage -Continue pain medications as tolerated Continue Ancef for now, changed to Keflex at discharge,d/c PICC as per ID. patient is refusing for removal of  PICC line. Surgery consult today to evaluate for Penrose drain in left upper back  #3 BPH and chronic Foley catheter-no evidence of infection. Has chronic conization. -Denies any bladder spasms at this time. -Continue Flomax and finasteride   Urology  consult today. #4 CLL-white count is actually improving. Continue to monitor him a WBC count fluctuating, 20 yesterday.  #5. DVT prophylaxis-on subcutaneous heparin  Will need rehabilitation now with right-sided weakness. -PT recommends rehabilitation but patient, wife  Are  refusing. Likely discharge tomorrow. urology to see regarding Foley catheter management and also surgery consult regarding Penrose management if everything goes well patient can go home tomorrow.   All the records are reviewed and case discussed with Care Management/Social Workerr. Management plans discussed with the patient, family and they are in agreement.  CODE STATUS: Full code  TOTAL TIME TAKING CARE OF THIS PATIENT: 38 minutes.   POSSIBLE D/C IN 2-3 DAYS, DEPENDING ON CLINICAL  CONDITION. D/w Anitra Lauth M.D on 12/26/2016 at 9:11 AM  Between 7am to 6pm - Pager - [phone]  After 6pm go to [email or web address] - password [email or web address] [facility]  Office  [phone]  CC: Primary care physician; [name], MD

## 2016-12-27 MED ORDER — HYDROCODONE-ACETAMINOPHEN 5-325 MG PO TABS: 1.0000 | ORAL_TABLET | ORAL | 0 refills | Status: AC | PRN

## 2016-12-27 MED ORDER — ATORVASTATIN CALCIUM 40 MG PO TABS: 40.0000 mg | ORAL_TABLET | Freq: Every day | ORAL | 2 refills | Status: AC

## 2016-12-27 MED ORDER — HEPARIN SOD (PORK) LOCK FLUSH 100 UNIT/ML IV SOLN
250.0000 [IU] | INTRAVENOUS | Status: AC | PRN
  Administered 2016-12-27: 17:00:00 250 [IU]
  Filled 2016-12-27: qty 5

## 2016-12-27 MED ORDER — CLOPIDOGREL BISULFATE 75 MG PO TABS: 75.0000 mg | ORAL_TABLET | Freq: Every day | ORAL | 2 refills | Status: AC

## 2016-12-27 MED ORDER — LIDOCAINE 5 % EX OINT: 1.0000 "application " | TOPICAL_OINTMENT | CUTANEOUS | 0 refills | Status: AC | PRN

## 2016-12-27 MED ORDER — SODIUM CHLORIDE 0.9% FLUSH
10.0000 mL | INTRAVENOUS | Status: AC | PRN
  Administered 2016-12-27: 17:00:00 10 mL

## 2016-12-27 MED ORDER — ASPIRIN 81 MG PO CHEW: 81.0000 mg | CHEWABLE_TABLET | Freq: Every day | ORAL | 2 refills | Status: AC

## 2016-12-27 MED ORDER — LIDOCAINE 5 % EX PTCH: 1.0000 | MEDICATED_PATCH | CUTANEOUS | 0 refills | Status: AC

## 2016-12-27 MED ORDER — POTASSIUM CHLORIDE CRYS ER 20 MEQ PO TBCR
40.0000 meq | EXTENDED_RELEASE_TABLET | Freq: Once | ORAL | Status: AC
  Administered 2016-12-27: 40 meq via ORAL
  Filled 2016-12-27: qty 2

## 2016-12-27 MED ORDER — CEPHALEXIN 500 MG PO CAPS: 500.0000 mg | ORAL_CAPSULE | Freq: Three times a day (TID) | ORAL | 0 refills | Status: AC

## 2016-12-27 MED ORDER — PROBIOTIC 250 MG PO CAPS: 1.0000 | ORAL_CAPSULE | Freq: Every day | ORAL | 0 refills | Status: AC

## 2016-12-27 MED ORDER — VANCOMYCIN HCL 125 MG PO CAPS: 125.0000 mg | ORAL_CAPSULE | Freq: Four times a day (QID) | ORAL | 0 refills | Status: AC

## 2016-12-27 NOTE — Care Management (Addendum)
Discharge to home today per Dr. Tressia Miners. [facility] will resume there services in the home. Will be receiving Palliative Care service in the home.b Transportation will be arranged per [facility]. Shelbie Ammons RN MSN CCM Care Management [phone]

## 2016-12-27 NOTE — Progress Notes (Signed)
New referral for Palliative to follow at home received from [facility].Plan is for discharge home today. Referral made aware. Thank you. Flo Shanks RN, BSN, [place] [facility] [place], [facility] [phone] c

## 2016-12-27 NOTE — Progress Notes (Signed)
Chaplain was making rounds and visited with pt in room 113. Chaplain provided the ministry of prayer and a spiritual presence.    12/27/16 0945  Clinical Encounter Type  Visited With Patient  Visit Type Initial;Spiritual support  Referral From Nurse  Consult/Referral To Chaplain  Spiritual Encounters  Spiritual Needs Prayer

## 2016-12-27 NOTE — Progress Notes (Signed)
Physical Therapy Treatment Patient Details Name: William Kennedy MRN: [id number] DOB: 10-Mar-1941 Today's Date: 12/27/2016    History of Present Illness presented to ER secondary to increasing confusion; admitted with sepsis related to UTI vs abscess on L upper back (s/p irrigation and debridement May, 2018).  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  Patient refusing invasive intervention or transfer to tertiary care center.  Orders for continued therapy received post-CVA; no restrictions noted (confirmed with Dr. Doy Mince)    PT Comments    Pt lethargic, but agreeable to PT (I'll try). Pt notes pain in R foot/ankle with movement and to the touch. Increased time to perform supine bed exercises with assist required throughout. Discussion with pt and family regarding rest versus work and use of music therapy for healing. Continue PT to progress range, strength, endurance to improve participation in functional mobility.    Follow Up Recommendations  SNF     Equipment Recommendations       Recommendations for Other Services       Precautions / Restrictions Restrictions Weight Bearing Restrictions: No    Mobility  Bed Mobility               General bed mobility comments: Not tested; pt lethargic with difficulty/pain with movement  Transfers                    Ambulation/Gait                 Stairs            Wheelchair Mobility    Modified Rankin (Stroke Patients Only)       Balance                                            Cognition Arousal/Alertness: Lethargic   Overall Cognitive Status: Within Functional Limits for tasks assessed                                        Exercises General Exercises - Lower Extremity Ankle Circles/Pumps: AROM;AAROM;Both;10 reps;Supine (requires  some assist on R) Quad Sets: Strengthening;Both;10 reps;Supine Gluteal Sets: Strengthening;Both;10 reps;Supine Short Arc Quad: AROM;AAROM;Both;10 reps;Supine (some assist for full range on R) Heel Slides: AAROM;Both;10 reps;Supine Hip ABduction/ADduction: AAROM;Both;10 reps;Supine Straight Leg Raises: AAROM;PROM;Both;5 reps;Supine (mostly passive on R) Other Exercises Other Exercises: Pt family education on rest and use of music therapy to assist brain healing.     General Comments        Pertinent Vitals/Pain Pain Assessment: Faces Faces Pain Scale: Hurts even more Pain Location: R foot/ankle. Genitals with movement due to catheter Pain Intervention(s): Limited activity within patient's tolerance;Monitored during session    Home Living                      Prior Function            PT Goals (current goals can now be found in the care plan section) Progress towards PT goals: Not progressing toward goals - comment    Frequency           PT Plan Current plan remains appropriate  Co-evaluation              AM-PAC PT "6 Clicks" Daily Activity  Outcome Measure  Difficulty turning over in bed (including adjusting bedclothes, sheets and blankets)?: Total Difficulty moving from lying on back to sitting on the side of the bed? : Total Difficulty sitting down on and standing up from a chair with arms (e.g., wheelchair, bedside commode, etc,.)?: Total Help needed moving to and from a bed to chair (including a wheelchair)?: Total Help needed walking in hospital room?: Total Help needed climbing 3-5 steps with a railing? : Total 6 Click Score: 6    End of Session   Activity Tolerance: Patient limited by fatigue;Patient limited by lethargy;Patient limited by pain Patient left: in bed;with call bell/phone within reach;with bed alarm set;with family/visitor present   PT Visit Diagnosis: Muscle weakness (generalized) (M62.81);Difficulty in walking, not  elsewhere classified (R26.2);Hemiplegia and hemiparesis Hemiplegia - Right/Left: Right Hemiplegia - dominant/non-dominant: Dominant Hemiplegia - caused by: Cerebral infarction Pain - Right/Left: Right Pain - part of body: Ankle and joints of foot     Time: 2725-3664 PT Time Calculation (min) (ACUTE ONLY): 24 min  Charges:  $Therapeutic Exercise: 23-37 mins                    G Codes:        Larae Grooms, PTA 12/27/2016, 12:04 PM

## 2016-12-27 NOTE — Discharge Summary (Signed)
[place] [facility] [place] NAME: William Kennedy    MR#:  [id number]  DATE OF BIRTH:  10-09-1940  DATE OF ADMISSION:  12/18/2016   ADMITTING PHYSICIAN: Demetrios Loll, MD  DATE OF DISCHARGE: 12/27/2016  PRIMARY CARE PHYSICIAN: [name], MD   ADMISSION DIAGNOSIS:   Confusion [R41.0] Sepsis, due to unspecified organism ([place]) [A41.9] Urinary tract infection without hematuria, site unspecified [N39.0] Cellulitis, unspecified cellulitis site [L03.90]  DISCHARGE DIAGNOSIS:   Active Problems:   Sepsis ([place])   Cellulitis   Urinary tract infection without hematuria   Cerebral infarction ([place])   Goals of care, counseling/discussion   Palliative care encounter   SECONDARY DIAGNOSIS:   Past Medical History:  Diagnosis Date  . Abscess    shoulder  . Leukemia [facility])     HOSPITAL COURSE:   76 year old male with past medical history significant for CLL, hypertension, BPH with chronic indwelling Foley catheter, subacute left upper back abscess presents to hospital secondary to altered mental status.  #1 acute left hemispheric CVA - MRI of the brain revealing scattered acute infarcts in left MCA and PCA territory. -MRI is positive for high-grade string sign stenosis at the left ICA origin and severe stenosis or thrombosis in the mid left ICA siphon. Chronic left PCA infarct noted, chronic right vertebral artery occlusion and severe stenosis of left ACA origin -Patient refused transfer to another facility for consideration of either thrombectomy -Weakness has improved from 1/5 to 3/5. -Started on aspirin and Plavix. Also added statin. Appreciate neurology consult.-Continue neuro checks, on aspirin and Plavix and statin -Echo did not reveal any source of thrombus. -Still high risk for worsening left ICA occlusion resulting in complete left hemispheric infarct would ultimately lead to cerebral edema. The risks explained to both patient and  wife at bedside -Physical therapy and occupational therapy have recommended rehabilitation due to significant deficits, however patient is very adamant and is wanting to be discharged home. His wife is at bedside and wants to respect his wishes and wants to take him home. -Home health will be set up. Also palliative care to follow-up at home. -Palliative care has followed the patient in the hospital and he is a DO NOT RESUSCITATE at this time.  #2 sepsis-secondary to left upper back abscess, status post lancing in May 2018 for the first time  -Status post incision and drainage again this admission, cultures are negative so far.  -Received IV Ancef in the hospital,-Appreciate ID consult.  -Has a Penrose drain in for more drainage- will be left in for 2 more weeks per surgery -Continue pain medications as tolerated -Will be discharged on oral Keflex for 3 weeks. Since patient developed C. difficile on antibiotics in June 2018, will also be discharged on probiotics and oral vancomycin for 31 days, will be finished 10 days after finishing his Keflex. -Outpatient ID and surgery follow-up in 2-3 weeks -PICC line will be removed prior to discharge  #3 BPH and Foley catheter-no evidence of infection. Foley catheter was inserted during last admission for acute urinary retention. Was started on Flomax and finasteride as well. -Also has belladonna suppository as needed for bladder spasms. -Since Foley catheter was changed this admission, patient has been complaining of significant burning pain when urinating, advised to get the Foley catheter removed and doing a voiding trial, however patient and his wife refusing to get the catheter removed at this time. -Given lidocaine gel for pain at the tip of the  penis, all the crusting needs to be removed but patient refusing anybody to clean his perineal area. -Urology appointment in 2 days as outpatient. 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 Continue to monitor   Again patient has been refusing rehabilitation, refusing to get the Foley out and do a voiding trial in the hospital. -Wife is very comfortable taking him home today. They're very anxious and wanted to get discharged today. -Very high risk for readmission. Palliative care after discharge recommended   DISCHARGE CONDITIONS:   Critical  CONSULTS OBTAINED:   Treatment Team:  Leonie Green, MD Leonel Ramsay, MD Alexis Goodell, MD  DRUG ALLERGIES:   No Known Allergies DISCHARGE MEDICATIONS:   Allergies as of 12/27/2016   No Known Allergies     Medication List    STOP taking these medications   traMADol 50 MG tablet Commonly known as:  ULTRAM     TAKE these medications   acetaminophen 325 MG tablet Commonly known as:  TYLENOL Take 650 mg by mouth every 4 (four) hours as needed for pain.   aspirin 81 MG chewable tablet Chew 1 tablet (81 mg total) by mouth daily.   atorvastatin 40 MG tablet Commonly known as:  LIPITOR Take 1 tablet (40 mg total) by mouth daily at 6 PM.   cephALEXin 500 MG capsule Commonly known as:  KEFLEX Take 1 capsule (500 mg total) by mouth 3 (three) times daily. X 20 days What changed:  when to take this  additional instructions   clopidogrel 75 MG tablet Commonly known as:  PLAVIX Take 1 tablet (75 mg total) by mouth daily.   feeding supplement (ENSURE ENLIVE) Liqd Take 237 mLs by mouth 3 (three) times daily between meals.   finasteride 5 MG tablet Commonly known as:  PROSCAR Take 1 tablet (5 mg total) by mouth daily.   HYDROcodone-acetaminophen 5-325 MG tablet Commonly known as:  NORCO/VICODIN Take 1 tablet by mouth every 4 (four) hours as needed for moderate pain.   lidocaine 5 % ointment Commonly known as:  XYLOCAINE Apply 1 application topically as needed. Apply to penile area   lidocaine 5  % Commonly known as:  LIDODERM Place 1 patch onto the skin daily. Remove & Discard patch within 12 hours or as directed by MD   loperamide 2 MG capsule Commonly known as:  IMODIUM Take 1 capsule (2 mg total) by mouth every 6 (six) hours as needed for diarrhea or loose stools.   metoprolol tartrate 25 MG tablet Commonly known as:  LOPRESSOR Take 0.5 tablets (12.5 mg total) by mouth 2 (two) times daily.   ondansetron 4 MG tablet Commonly known as:  ZOFRAN Take 4 mg by mouth every 8 (eight) hours as needed for nausea.   opium-belladonna 16.2-60 MG suppository Commonly known as:  B&O SUPPRETTES Place 1 suppository rectally 2 (two) times daily as needed for bladder spasms.   Probiotic 250 MG Caps Take 1 capsule by mouth daily. X 3 weeks   tamsulosin 0.4 MG Caps capsule Commonly known as:  FLOMAX Take 1 capsule (0.4 mg total) by mouth daily.   vancomycin 125 MG capsule Commonly known as:  VANCOCIN Take 1 capsule (125 mg total) by mouth 4 (four) times daily. X 4 weeks What changed:  medication strength  how much to take  when to take this  additional instructions  Another medication with the same name was  removed. Continue taking this medication, and follow the directions you see here.        DISCHARGE INSTRUCTIONS:   1. PCP f/u in 1-2 weeks 2. Urology f/u in 2-3 days 3. ID f/u in 2-3 weeks 4. Surgery f/u in 2 weeks 5. Palliative care follow up after discharge  DIET:   Cardiac diet  ACTIVITY:   Activity as tolerated  OXYGEN:   Home Oxygen: No.  Oxygen Delivery: room air  DISCHARGE LOCATION:   home   If you experience worsening of your admission symptoms, develop shortness of breath, life threatening emergency, suicidal or homicidal thoughts you must seek medical attention immediately by calling 911 or calling your MD immediately  if symptoms less severe.  You Must read complete instructions/literature along with all the possible adverse reactions/side  effects for all the Medicines you take and that have been prescribed to you. Take any new Medicines after you have completely understood and accpet all the possible adverse reactions/side effects.   Please note  You were cared for by a hospitalist during your hospital stay. If you have any questions about your discharge medications or the care you received while you were in the hospital after you are discharged, you can call the unit and asked to speak with the hospitalist on call if the hospitalist that took care of you is not available. Once you are discharged, your primary care physician will handle any further medical issues. Please note that NO REFILLS for any discharge medications will be authorized once you are discharged, as it is imperative that you return to your primary care physician (or establish a relationship with a primary care physician if you do not have one) for your aftercare needs so that they can reassess your need for medications and monitor your lab values.    On the day of Discharge:  VITAL SIGNS:   Blood pressure 131/66, pulse 95, temperature 98.5 F (36.9 C), temperature source Oral, resp. rate 18, height 6\' 3"  (1.905 m), weight 101.3 kg (223 lb 6.4 oz), SpO2 98 %.  PHYSICAL EXAMINATION:    GENERAL:  76 y.o.-year-old patient lying in the bed, in some distress from pain with foley catheter.  EYES: Pupils equal, round, reactive to light and accommodation. No scleral icterus. Extraocular muscles intact.  HEENT: Head atraumatic, normocephalic. Oropharynx and nasopharynx clear.  NECK:  Supple, no jugular venous distention. No thyroid enlargement, no tenderness.  LUNGS: Normal breath sounds bilaterally, no wheezing, rales,rhonchi or crepitation. No use of accessory muscles of respiration. Decreased bibasilar breath sounds CARDIOVASCULAR: S1, S2 normal. No rubs, or gallops. 2/6 systolic murmur is present ABDOMEN: Soft, non-tender, non-distended. Bowel sounds present. No  organomegaly or mass.  GU: extreme dryness of skin with crusts noted at penile region, tip looks normal with no discharge, minimal fungal infection around it EXTREMITIES: No pedal edema, cyanosis, or clubbing.  On the left subscapular region on the upper back there is hyperpigmentation due to recent procedure, a drain left in noted, there is a firm indurated tender areas noted around the dressing NEUROLOGIC: Cranial nerves II through XII are intact. RUE strength is 3/5 and RLE is 3/5 as well. Sensation intact. Gait not checked.  PSYCHIATRIC: The patient is alert and oriented x 3.  SKIN: No obvious rash, lesion, or ulcer.   DATA REVIEW:   CBC  Recent Labs Lab 12/25/16 0956  WBC 20.1*  HGB 13.5  HCT 39.1*  PLT 286    Chemistries   Recent Labs Lab  12/25/16 0956  NA 136  K 3.3*  CL 101  CO2 27  GLUCOSE 104*  BUN 7  CREATININE 0.78  CALCIUM 9.5     Microbiology Results  Results for orders placed or performed during the hospital encounter of 12/18/16  Culture, blood (Routine x 2)     Status: None   Collection Time: 12/18/16 12:35 PM  Result Value Ref Range Status   Specimen Description BLOOD RIGHT ANTECUBITAL  Final   Special Requests   Final    BOTTLES DRAWN AEROBIC AND ANAEROBIC Blood Culture adequate volume   Culture NO GROWTH 5 DAYS  Final   Report Status 12/23/2016 FINAL  Final  Culture, blood (Routine x 2)     Status: None   Collection Time: 12/18/16 12:35 PM  Result Value Ref Range Status   Specimen Description BLOOD BLOOD LEFT WRIST  Final   Special Requests   Final    BOTTLES DRAWN AEROBIC AND ANAEROBIC Blood Culture adequate volume   Culture NO GROWTH 5 DAYS  Final   Report Status 12/23/2016 FINAL  Final  Urine culture     Status: None   Collection Time: 12/18/16 12:35 PM  Result Value Ref Range Status   Specimen Description URINE, RANDOM  Final   Special Requests NONE  Final   Culture   Final    NO GROWTH Performed at [facility], [place]  [address] [place], [place] [address]    Report Status 12/19/2016 FINAL  Final  C difficile quick scan w PCR reflex     Status: None   Collection Time: 12/18/16  6:02 PM  Result Value Ref Range Status   C Diff antigen NEGATIVE NEGATIVE Final   C Diff toxin NEGATIVE NEGATIVE Final   C Diff interpretation No C. difficile detected.  Final  MRSA PCR Screening     Status: None   Collection Time: 12/19/16 11:24 AM  Result Value Ref Range Status   MRSA by PCR NEGATIVE NEGATIVE Final    Comment:        The GeneXpert MRSA Assay (FDA approved for NASAL specimens only), is one component of a comprehensive MRSA colonization surveillance program. It is not intended to diagnose MRSA infection nor to guide or monitor treatment for MRSA infections.   Aerobic/Anaerobic Culture (surgical/deep wound)     Status: None   Collection Time: 12/21/16 12:28 PM  Result Value Ref Range Status   Specimen Description BACK  Final   Special Requests Immunocompromised  Final   Gram Stain   Final    ABUNDANT WBC PRESENT, PREDOMINANTLY PMN NO ORGANISMS SEEN    Culture   Final    No growth aerobically or anaerobically. Performed at [facility], [place] [address], [place], [place] [address]    Report Status 12/26/2016 FINAL  Final    RADIOLOGY:  No results found.   Management plans discussed with the patient, family and they are in agreement.  CODE STATUS:     Code Status Orders        Start     Ordered   12/24/16 1658  Do not attempt resuscitation (DNR)  Continuous    Question Answer Comment  In the event of cardiac or respiratory ARREST Do not call a "code blue"   In the event of cardiac or respiratory ARREST Do not perform Intubation, CPR, defibrillation or ACLS   In the event of cardiac or respiratory ARREST Use medication by any route, position, wound care, and  other measures to relive pain and suffering. May use oxygen, suction and manual treatment of airway obstruction as needed for comfort.       12/24/16 1659    Code Status History    Date Active Date Inactive Code Status Order ID Comments User Context   12/18/2016  5:39 PM 12/24/2016  4:59 PM Full Code [id number]  Demetrios Loll, MD Inpatient   11/30/2016  6:10 PM 12/07/2016  1:29 AM Full Code [id number]  Hillary Bow, MD ED   10/29/2016  3:25 PM 11/09/2016  6:11 PM Full Code [id number]  Nicholes Mango, MD Inpatient      TOTAL TIME TAKING CARE OF THIS PATIENT: 37 minutes.    Sheenah Dimitroff M.D on 12/27/2016 at 3:42 PM  Between 7am to 6pm - Pager - [phone]  After 6pm go to [email or web address] - [employer] [facility]  Office  [phone]  CC: Primary care physician; [name], MD   Note: This dictation was prepared with Dragon dictation along with smaller phrase technology. Any transcriptional errors that result from this process are unintentional.

## 2016-12-27 NOTE — Clinical Social Work Note (Signed)
Per RNCM, pt will return home. RNCM will follow for discharge planning needs. CSW is signing off as no further needs identified.   Darden Dates, MSW, LCSW  Clinical Social Worker  [phone]

## 2016-12-27 NOTE — Progress Notes (Signed)
No clo of pain. Pt is to be discharged home. Foley in place. Spouse at bedside. Awaiting for EMS. Continue to monitor.

## 2016-12-27 NOTE — Progress Notes (Signed)
Pt is being discharged home with [facility]. Discharge papers given and explained to pt and spouse, spouse verbalized understanding. Meds and f/u appointments reviewed with pt's spouse. RX given. Called EMS for transportation.

## 2016-12-27 NOTE — Progress Notes (Signed)
It is now 6 days since incision and drainage of a recurrent abscess of the left upper back.   Culture of the fluid drained 6 days ago was no growth.  He has continued to have a serosanguineous drainage with frequent dressing changes.  On exam the dressing was removed seeing in the serosanguineous drainage.  The Penrose drains to remain intact.  There is some dark discoloration of the skin in the area of approximately 8 by 8 cm. which is stable.  Diagnosis: History of back abscess, most recent culture negative  I recommended continued dressing changes and leave the Penrose drains in for 2 more weeks.  No additional antibiotics needed for back abscess.  Can come to the office in 2 weeks to have the Penrose drains removed.  Discussed with Dr.Konidena

## 2016-12-27 NOTE — Progress Notes (Signed)
Pt is discharged home. Pt is escorted by [facility] to pt's home with Home Health. Continue to monitor.

## 2016-12-31 ENCOUNTER — Ambulatory Visit: Payer: Self-pay

## 2017-01-02 ENCOUNTER — Encounter: Payer: Self-pay | Admitting: General Surgery

## 2017-01-02 NOTE — Progress Notes (Signed)
Wife called reporting patient in pain at site of back abscess previously drained by Dr. Tamala Julian. F/U appt w/ Dr. Tamala Julian tomorrow.  RX for Ultram, 50 mg, # 15 1-2 q6h called to [facility].

## 2017-01-03 ENCOUNTER — Ambulatory Visit: Payer: Self-pay | Admitting: Urology

## 2017-01-10 ENCOUNTER — Ambulatory Visit: Payer: Self-pay | Admitting: Urology

## 2017-01-12 ENCOUNTER — Encounter: Payer: Self-pay | Admitting: Emergency Medicine

## 2017-01-12 ENCOUNTER — Emergency Department: Payer: BLUE CROSS/BLUE SHIELD

## 2017-01-12 ENCOUNTER — Inpatient Hospital Stay
Admission: EM | Admit: 2017-01-12 | Discharge: 2017-01-22 | DRG: 698 | Disposition: E | Payer: BLUE CROSS/BLUE SHIELD | Attending: Internal Medicine | Admitting: Internal Medicine

## 2017-01-12 DIAGNOSIS — R338 Other retention of urine: Secondary | ICD-10-CM | POA: Diagnosis present

## 2017-01-12 DIAGNOSIS — C9110 Chronic lymphocytic leukemia of B-cell type not having achieved remission: Secondary | ICD-10-CM | POA: Diagnosis present

## 2017-01-12 DIAGNOSIS — E878 Other disorders of electrolyte and fluid balance, not elsewhere classified: Secondary | ICD-10-CM | POA: Diagnosis present

## 2017-01-12 DIAGNOSIS — N170 Acute kidney failure with tubular necrosis: Secondary | ICD-10-CM | POA: Diagnosis present

## 2017-01-12 DIAGNOSIS — R6521 Severe sepsis with septic shock: Secondary | ICD-10-CM | POA: Diagnosis not present

## 2017-01-12 DIAGNOSIS — N401 Enlarged prostate with lower urinary tract symptoms: Secondary | ICD-10-CM | POA: Diagnosis present

## 2017-01-12 DIAGNOSIS — A419 Sepsis, unspecified organism: Secondary | ICD-10-CM | POA: Diagnosis present

## 2017-01-12 DIAGNOSIS — T83098A Other mechanical complication of other indwelling urethral catheter, initial encounter: Secondary | ICD-10-CM | POA: Diagnosis present

## 2017-01-12 DIAGNOSIS — Z66 Do not resuscitate: Secondary | ICD-10-CM | POA: Diagnosis present

## 2017-01-12 DIAGNOSIS — E669 Obesity, unspecified: Secondary | ICD-10-CM | POA: Diagnosis present

## 2017-01-12 DIAGNOSIS — Z79899 Other long term (current) drug therapy: Secondary | ICD-10-CM | POA: Diagnosis not present

## 2017-01-12 DIAGNOSIS — Z7902 Long term (current) use of antithrombotics/antiplatelets: Secondary | ICD-10-CM

## 2017-01-12 DIAGNOSIS — E872 Acidosis, unspecified: Secondary | ICD-10-CM

## 2017-01-12 DIAGNOSIS — Z7189 Other specified counseling: Secondary | ICD-10-CM | POA: Diagnosis not present

## 2017-01-12 DIAGNOSIS — N3001 Acute cystitis with hematuria: Secondary | ICD-10-CM | POA: Diagnosis not present

## 2017-01-12 DIAGNOSIS — Z833 Family history of diabetes mellitus: Secondary | ICD-10-CM

## 2017-01-12 DIAGNOSIS — N390 Urinary tract infection, site not specified: Secondary | ICD-10-CM | POA: Diagnosis not present

## 2017-01-12 DIAGNOSIS — L02414 Cutaneous abscess of left upper limb: Secondary | ICD-10-CM | POA: Diagnosis present

## 2017-01-12 DIAGNOSIS — I69392 Facial weakness following cerebral infarction: Secondary | ICD-10-CM

## 2017-01-12 DIAGNOSIS — I959 Hypotension, unspecified: Secondary | ICD-10-CM

## 2017-01-12 DIAGNOSIS — Z515 Encounter for palliative care: Secondary | ICD-10-CM | POA: Diagnosis not present

## 2017-01-12 DIAGNOSIS — Z7982 Long term (current) use of aspirin: Secondary | ICD-10-CM | POA: Diagnosis not present

## 2017-01-12 DIAGNOSIS — N138 Other obstructive and reflux uropathy: Secondary | ICD-10-CM | POA: Diagnosis present

## 2017-01-12 DIAGNOSIS — E870 Hyperosmolality and hypernatremia: Secondary | ICD-10-CM | POA: Diagnosis present

## 2017-01-12 DIAGNOSIS — L02212 Cutaneous abscess of back [any part, except buttock]: Secondary | ICD-10-CM

## 2017-01-12 DIAGNOSIS — Z6828 Body mass index (BMI) 28.0-28.9, adult: Secondary | ICD-10-CM

## 2017-01-12 DIAGNOSIS — G9341 Metabolic encephalopathy: Secondary | ICD-10-CM | POA: Diagnosis present

## 2017-01-12 DIAGNOSIS — R4182 Altered mental status, unspecified: Secondary | ICD-10-CM | POA: Diagnosis present

## 2017-01-12 DIAGNOSIS — Y846 Urinary catheterization as the cause of abnormal reaction of the patient, or of later complication, without mention of misadventure at the time of the procedure: Secondary | ICD-10-CM | POA: Diagnosis present

## 2017-01-12 DIAGNOSIS — Z87891 Personal history of nicotine dependence: Secondary | ICD-10-CM | POA: Diagnosis not present

## 2017-01-12 DIAGNOSIS — E860 Dehydration: Secondary | ICD-10-CM | POA: Diagnosis present

## 2017-01-12 LAB — URINALYSIS, COMPLETE (UACMP) WITH MICROSCOPIC
BILIRUBIN URINE: NEGATIVE
Glucose, UA: NEGATIVE mg/dL
KETONES UR: NEGATIVE mg/dL
NITRITE: NEGATIVE
Protein, ur: 100 mg/dL — AB
Specific Gravity, Urine: 1.017 (ref 1.005–1.030)
Squamous Epithelial / LPF: NONE SEEN
pH: 5 (ref 5.0–8.0)

## 2017-01-12 LAB — COMPREHENSIVE METABOLIC PANEL
ALBUMIN: 2.8 g/dL — AB (ref 3.5–5.0)
ALK PHOS: 106 U/L (ref 38–126)
ALT: 35 U/L (ref 17–63)
AST: 39 U/L (ref 15–41)
Anion gap: 18 — ABNORMAL HIGH (ref 5–15)
BILIRUBIN TOTAL: 0.8 mg/dL (ref 0.3–1.2)
BUN: 108 mg/dL — AB (ref 6–20)
CO2: 23 mmol/L (ref 22–32)
Calcium: 11.6 mg/dL — ABNORMAL HIGH (ref 8.9–10.3)
Chloride: 106 mmol/L (ref 101–111)
Creatinine, Ser: 5.26 mg/dL — ABNORMAL HIGH (ref 0.61–1.24)
GFR calc Af Amer: 11 mL/min — ABNORMAL LOW (ref >60)
GFR, EST NON AFRICAN AMERICAN: 10 mL/min — AB (ref >60)
GLUCOSE: 106 mg/dL — AB (ref 65–99)
POTASSIUM: 4.4 mmol/L (ref 3.5–5.1)
Sodium: 147 mmol/L — ABNORMAL HIGH (ref 135–145)
TOTAL PROTEIN: 8.1 g/dL (ref 6.5–8.1)

## 2017-01-12 LAB — GLUCOSE, CAPILLARY
GLUCOSE-CAPILLARY: 87 mg/dL (ref 65–99)
GLUCOSE-CAPILLARY: 64 mg/dL — AB (ref 65–99)
GLUCOSE-CAPILLARY: 98 mg/dL (ref 65–99)
Glucose-Capillary: 89 mg/dL (ref 65–99)

## 2017-01-12 LAB — CBC WITH DIFFERENTIAL/PLATELET
BASOS ABS: 0 10*3/uL (ref 0–0.1)
BASOS PCT: 0 %
EOS ABS: 0.5 10*3/uL (ref 0–0.7)
Eosinophils Relative: 1 %
HCT: 53.7 % — ABNORMAL HIGH (ref 40.0–52.0)
Hemoglobin: 17.9 g/dL (ref 13.0–18.0)
LYMPHS ABS: 15.6 10*3/uL — AB (ref 1.0–3.6)
Lymphocytes Relative: 31 %
MCH: 30 pg (ref 26.0–34.0)
MCHC: 33.4 g/dL (ref 32.0–36.0)
MCV: 90 fL (ref 80.0–100.0)
MONO ABS: 3 10*3/uL — AB (ref 0.2–1.0)
Monocytes Relative: 6 %
NEUTROS ABS: 31.1 10*3/uL — AB (ref 1.4–6.5)
Neutrophils Relative %: 62 %
PLATELETS: 386 10*3/uL (ref 150–440)
RBC: 5.97 MIL/uL — ABNORMAL HIGH (ref 4.40–5.90)
RDW: 14.5 % (ref 11.5–14.5)
Smear Review: ADEQUATE
WBC: 50.2 10*3/uL (ref 3.8–10.6)

## 2017-01-12 LAB — TROPONIN I

## 2017-01-12 LAB — PROCALCITONIN: Procalcitonin: 2.3 ng/mL

## 2017-01-12 LAB — BLOOD GAS, VENOUS
ACID-BASE DEFICIT: 1.9 mmol/L (ref 0.0–2.0)
Bicarbonate: 24.3 mmol/L (ref 20.0–28.0)
PH VEN: 7.34 (ref 7.250–7.430)
Patient temperature: 37
pCO2, Ven: 45 mmHg (ref 44.0–60.0)

## 2017-01-12 LAB — LACTIC ACID, PLASMA
LACTIC ACID, VENOUS: 3 mmol/L — AB (ref 0.5–1.9)
Lactic Acid, Venous: 1.9 mmol/L (ref 0.5–1.9)

## 2017-01-12 MED ORDER — SODIUM CHLORIDE 0.9% FLUSH
10.0000 mL | Freq: Two times a day (BID) | INTRAVENOUS | Status: DC
  Administered 2017-01-12 – 2017-01-15 (×6): 10 mL
  Administered 2017-01-15 – 2017-01-16 (×2): 30 mL
  Administered 2017-01-16 – 2017-01-17 (×3): 10 mL
  Administered 2017-01-18: 40 mL
  Administered 2017-01-18: 30 mL

## 2017-01-12 MED ORDER — ACETAMINOPHEN 650 MG RE SUPP: 650.0000 mg | Freq: Four times a day (QID) | RECTAL | Status: DC | PRN

## 2017-01-12 MED ORDER — SODIUM CHLORIDE 0.9 % IV BOLUS (SEPSIS)
1000.0000 mL | Freq: Once | INTRAVENOUS | Status: AC
  Administered 2017-01-12: 1000 mL via INTRAVENOUS

## 2017-01-12 MED ORDER — HYDROCODONE-ACETAMINOPHEN 5-325 MG PO TABS: 1.0000 | ORAL_TABLET | ORAL | Status: DC | PRN

## 2017-01-12 MED ORDER — VANCOMYCIN HCL IN DEXTROSE 1-5 GM/200ML-% IV SOLN
1000.0000 mg | Freq: Once | INTRAVENOUS | Status: AC
  Administered 2017-01-12: 1000 mg via INTRAVENOUS
  Filled 2017-01-12: qty 200

## 2017-01-12 MED ORDER — ATORVASTATIN CALCIUM 20 MG PO TABS: 40.0000 mg | ORAL_TABLET | Freq: Every day | ORAL | Status: DC

## 2017-01-12 MED ORDER — VANCOMYCIN 50 MG/ML ORAL SOLUTION
125.0000 mg | Freq: Four times a day (QID) | ORAL | Status: DC
  Administered 2017-01-12: 125 mg via ORAL
  Filled 2017-01-12 (×3): qty 2.5

## 2017-01-12 MED ORDER — SODIUM CHLORIDE 0.9 % IV BOLUS (SEPSIS)
500.0000 mL | Freq: Once | INTRAVENOUS | Status: AC
  Administered 2017-01-12: 500 mL via INTRAVENOUS

## 2017-01-12 MED ORDER — ENSURE ENLIVE PO LIQD: 237.0000 mL | Freq: Three times a day (TID) | ORAL | Status: DC

## 2017-01-12 MED ORDER — MORPHINE SULFATE (PF) 2 MG/ML IV SOLN
0.5000 mg | INTRAVENOUS | Status: DC | PRN
  Administered 2017-01-12: 0.5 mg via INTRAVENOUS
  Filled 2017-01-12: qty 1

## 2017-01-12 MED ORDER — SODIUM CHLORIDE 0.9% FLUSH: 10.0000 mL | INTRAVENOUS | Status: DC | PRN

## 2017-01-12 MED ORDER — TRAZODONE HCL 50 MG PO TABS: 25.0000 mg | ORAL_TABLET | Freq: Every evening | ORAL | Status: DC | PRN

## 2017-01-12 MED ORDER — ONDANSETRON HCL 4 MG/2ML IJ SOLN: 4.0000 mg | Freq: Four times a day (QID) | INTRAMUSCULAR | Status: DC | PRN

## 2017-01-12 MED ORDER — PIPERACILLIN-TAZOBACTAM 3.375 G IVPB
3.3750 g | Freq: Two times a day (BID) | INTRAVENOUS | Status: DC
  Administered 2017-01-12 – 2017-01-14 (×4): 3.375 g via INTRAVENOUS
  Filled 2017-01-12 (×4): qty 50

## 2017-01-12 MED ORDER — PIPERACILLIN-TAZOBACTAM 3.375 G IVPB 30 MIN
3.3750 g | Freq: Once | INTRAVENOUS | Status: AC
  Administered 2017-01-12: 3.375 g via INTRAVENOUS
  Filled 2017-01-12: qty 50

## 2017-01-12 MED ORDER — TAMSULOSIN HCL 0.4 MG PO CAPS: 0.4000 mg | ORAL_CAPSULE | Freq: Every day | ORAL | Status: DC

## 2017-01-12 MED ORDER — CLOPIDOGREL BISULFATE 75 MG PO TABS: 75.0000 mg | ORAL_TABLET | Freq: Every day | ORAL | Status: DC

## 2017-01-12 MED ORDER — FINASTERIDE 5 MG PO TABS: 5.0000 mg | ORAL_TABLET | Freq: Every day | ORAL | Status: DC

## 2017-01-12 MED ORDER — VANCOMYCIN HCL 125 MG PO CAPS: 125.0000 mg | ORAL_CAPSULE | Freq: Four times a day (QID) | ORAL | Status: DC

## 2017-01-12 MED ORDER — VANCOMYCIN 50 MG/ML ORAL SOLUTION
500.0000 mg | Freq: Four times a day (QID) | ORAL | Status: DC
  Administered 2017-01-12 – 2017-01-14 (×3): 500 mg via ORAL
  Filled 2017-01-12 (×11): qty 10

## 2017-01-12 MED ORDER — ACETAMINOPHEN 325 MG PO TABS: 650.0000 mg | ORAL_TABLET | Freq: Four times a day (QID) | ORAL | Status: DC | PRN

## 2017-01-12 MED ORDER — ONDANSETRON HCL 4 MG PO TABS: 4.0000 mg | ORAL_TABLET | Freq: Four times a day (QID) | ORAL | Status: DC | PRN

## 2017-01-12 MED ORDER — PHENYLEPHRINE HCL 10 MG/ML IJ SOLN
0.0000 ug/min | INTRAMUSCULAR | Status: DC
  Administered 2017-01-12: 90 ug/min via INTRAVENOUS
  Administered 2017-01-12: 20 ug/min via INTRAVENOUS
  Administered 2017-01-12: 90 ug/min via INTRAVENOUS
  Administered 2017-01-12: 70 ug/min via INTRAVENOUS
  Filled 2017-01-12 (×4): qty 1

## 2017-01-12 MED ORDER — SODIUM CHLORIDE 0.9 % IV SOLN
INTRAVENOUS | Status: DC
  Administered 2017-01-12: 15:00:00 via INTRAVENOUS

## 2017-01-12 MED ORDER — HEPARIN SODIUM (PORCINE) 5000 UNIT/ML IJ SOLN
5000.0000 [IU] | Freq: Three times a day (TID) | INTRAMUSCULAR | Status: DC
  Administered 2017-01-12 – 2017-01-15 (×8): 5000 [IU] via SUBCUTANEOUS
  Filled 2017-01-12 (×9): qty 1

## 2017-01-12 MED ORDER — DEXTROSE 5 % IV SOLN
2.0000 g | Freq: Every day | INTRAVENOUS | Status: DC
  Filled 2017-01-12 (×2): qty 2

## 2017-01-12 MED ORDER — PIPERACILLIN-TAZOBACTAM 3.375 G IVPB: 3.3750 g | Freq: Two times a day (BID) | INTRAVENOUS | Status: DC

## 2017-01-12 MED ORDER — DOCUSATE SODIUM 100 MG PO CAPS: 100.0000 mg | ORAL_CAPSULE | Freq: Two times a day (BID) | ORAL | Status: DC

## 2017-01-12 NOTE — Progress Notes (Signed)
   01/14/2017 1541  Vitals  BP (!) 73/54  MAP (mmHg) (!) 61  ECG Heart Rate 97  Resp 18  Oxygen Therapy  End [place] CO2 (EtCO2) 20     BP cuff adjusted This was the recheck NP notified will start Neo

## 2017-01-12 NOTE — Progress Notes (Signed)
He was admitted emergently with lethargy and hypotension and suspicion of sepsis.  He does have a history of an abscess of the upper back which dates back to approximately March of this year.  He presented for surgical consultation on May 18 and the abscess was incised and drained at that time.  Cultures demonstrated methicillin sensitive Staphylococcus aureus.  He has had multiple hospital admissions and also has had problems with ureteral calculus and urinary infection and also had recent diverticulitis. He has chronic lymphocytic leukemia  Seeing his abscess was improving and drainage was slowing down his Penrose drain was removed on July 16.  Later due to recurrent swelling at the site the abscess was again and opened and drained on July 31.  Culture at that time was no growth.  Plans were to remove the Penrose drains today in the office setting however due to illness was admitted emergently to the hospital.  On examination he is lethargic but is arousable. BP 94/65.  Examination of the left upper back does reveal the draining wound with 2 Penrose drains held intact with silk suture.  There is a serosanguineous drainage which is draining onto a chux.  There is no surrounding swelling or tenderness.  There is some purplish discoloration of the skin which is chronic.  A culture swab was submitted for routine culture.  Abdomen is obese soft and nontender.  He does have an indwelling Foley catheter which had been changed earlier today   Impression: Draining wound of the left upper back which does not currently appear to be the source of sepsis.  I applied to ABD pads with a minimal amount of paper tape.  I discussed with the nurse plan to change the ABD pads as needed for drainage.  Patient is currently receiving Zosyn and vancomycin and awaiting culture reports of urine and blood.

## 2017-01-12 NOTE — Progress Notes (Signed)
Pharmacy Antibiotic Note  William Kennedy is a 76 y.o. male admitted on 01/15/2017 with sepsis/wound infection  Pharmacy has been consulted for Vancomycin and Zosyn dosing. Orders changed to vancomycin and cefepime.   Acute Renal failure Patient on Oral Vancomycin PTA for hx Cdiff, Patient has draining wound to left shoulder with pin rose drain in place, Chronic indwelling foley catheter, hx CLL Recent admission to [facility] 7/28-12/27/16- upper back abscess, etc   Plan: After discussion with ID, will d/c vancomycin and resume Zosyn.   Zosyn 3.375 g EI q 12 hours subsequent to initial Zosyn dose.    Height: 6' (182.9 cm) Weight: 207 lb 6.4 oz (94.1 kg) IBW/kg (Calculated) : 77.6  Temp (24hrs), Avg:97.8 F (36.6 C), Min:97.6 F (36.4 C), Max:97.9 F (36.6 C)   Recent Labs Lab 01/05/2017 0948  WBC 50.2*  CREATININE 5.26*  LATICACIDVEN 3.0*    Estimated Creatinine Clearance: 14.5 mL/min (A) (by C-G formula based on SCr of 5.26 mg/dL (H)).    No Known Allergies  Antimicrobials this admission: Zosyn 8/22 >> Vanc 8/22 x 2 Oral vanc 8/22 >>   Dose adjustments this admission:   Microbiology results: 8/22 BCx: sent 8/22 UCx: sent   Thank you for allowing pharmacy to be a part of this patient's care.  [name] 01/01/2017 5:25 PM

## 2017-01-12 NOTE — ED Notes (Signed)
Code Sepsis called to [facility]

## 2017-01-12 NOTE — Plan of Care (Signed)
Unable to collect Lactic acid due at 3:30 pm on time due to poor vascular access awaiting picc placement

## 2017-01-12 NOTE — Progress Notes (Signed)
Pharmacy Antibiotic Note  William Kennedy is a 76 y.o. male admitted on 12/28/2016 with sepsis/wound infection  Pharmacy has been consulted for Vancomycin and Zosyn dosing. Orders changed to vancomycin and cefepime.   Acute Renal failure Patient on Oral Vancomycin PTA for hx Cdiff, Patient has draining wound to left shoulder with pin rose drain in place, Chronic indwelling foley catheter, hx CLL Recent admission to [facility] 7/28-12/27/16- upper back abscess, etc   Plan: Will begin cefepime 2 g iv q 24 hours beginning 6 hours after Zosyn dose.   Vd=  84.6 L (based upon actual body weight)  Patient is in acute renal failure and calculating a ke would likely not represent an accurate predicted vancomycin elimination rate.  Vancomycin 1000 mg iv once given in ED. Will give an additional 1000 mg iv vancomycin to target a Cmax of 25 mcg/ml. Will f/u renal function and check levels and/or adjust vancomycin dosing as clinically indicated   Height: 6' (182.9 cm) Weight: 207 lb 6.4 oz (94.1 kg) IBW/kg (Calculated) : 77.6  Temp (24hrs), Avg:97.8 F (36.6 C), Min:97.6 F (36.4 C), Max:97.9 F (36.6 C)   Recent Labs Lab 12/31/2016 0948  WBC 50.2*  CREATININE 5.26*  LATICACIDVEN 3.0*    Estimated Creatinine Clearance: 14.5 mL/min (A) (by C-G formula based on SCr of 5.26 mg/dL (H)).    No Known Allergies  Antimicrobials this admission: Zosyn 8/22 x 1 Vanc 8/22 >>   Oral vanc 8/22 >>  Cefepime 8/22 >>  Dose adjustments this admission:   Microbiology results: 8/22 BCx: sent 8/22 UCx: sent   Thank you for allowing pharmacy to be a part of this patient's care.  Napoleon Form 01/04/2017 3:28 PM

## 2017-01-12 NOTE — Progress Notes (Signed)
Pharmacy Antibiotic Note  William Kennedy is a 76 y.o. male admitted on 01/03/2017 with sepsis/wound infection  Pharmacy has been consulted for Vancomycin and Zosyn dosing.  Acute Renal failure Patient on Oral Vancomycin PTA for hx Cdiff, Patient has draining wound to left shoulder with pin rose drain in place, Chronic indwelling foley catheter, hx CLL Recent admission to [facility] 7/28-12/27/16- upper back abscess, etc   Plan: Patient received Zosyn 3.375gm IV x 1 in ER and Vancomycin 1 gram IV x 1 in ER. -Will continue with Zosyn 3.375gm EI q12h for Crcl < 20 ml/min. -Will order random Vancomycin level ~ 24 hours after dose on 8/23 at 1000,  to assess further dosing given acute renal failure, Scr 5.26.  Ke 0.016  T1/2 43  Vd 73      Goal Vancomycin trough 15-20 mcg/ml  F/U renal fxn.     Height: 6\' 3"  (190.5 cm) Weight: 230 lb (104.3 kg) IBW/kg (Calculated) : 84.5  Temp (24hrs), Avg:97.9 F (36.6 C), Min:97.9 F (36.6 C), Max:97.9 F (36.6 C)   Recent Labs Lab 12/25/2016 0948  WBC 50.2*  CREATININE 5.26*  LATICACIDVEN 3.0*    Estimated Creatinine Clearance: 15.9 mL/min (A) (by C-G formula based on SCr of 5.26 mg/dL (H)).    No Known Allergies  Antimicrobials this admission: Zosyn 8/22 >>   Vanc 8/22 >>    Dose adjustments this admission:    Microbiology results: 8/22 BCx: pending 8/22 UCx: pending    Sputum:      MRSA PCR:    Thank you for allowing pharmacy to be a part of this patient's care.  Lovelee Forner A 12/30/2016 11:34 AM

## 2017-01-12 NOTE — ED Provider Notes (Addendum)
[facility] Emergency Department Provider Note       Time seen: ----------------------------------------- 9:39 AM on 01/20/2017 -----------------------------------------  Level V caveat: History/ROS limited by altered mental status   I have reviewed the triage vital signs and the nursing notes.   HISTORY   Chief Complaint No chief complaint on file.    HPI William Kennedy is a 76 y.o. male who presents to the ED for altered mental status and presumed sepsis. Patient has had progressive altered mental status with recent treatment on multiple occasions for sepsis and severe sepsis. He has a chronic indwelling Foley catheter as well as a wound on his left shoulder and has had C. difficile colitis in the past. He is unable to give any review of systems or report.   Past Medical History:  Diagnosis Date  . Abscess    shoulder  . Leukemia [facility])     Patient Active Problem List   Diagnosis Date Noted  . Cerebral infarction ([place])   . Goals of care, counseling/discussion   . Palliative care encounter   . Cellulitis   . Urinary tract infection without hematuria   . Confusion 12/06/2016  . Urinary retention   . Microscopic hematuria   . Sepsis ([place]) 11/30/2016  . Left ureteral stone   . Cellulitis and abscess of trunk 10/29/2016  . CLL (chronic lymphocytic leukemia) ([place]) 10/21/2013    Past Surgical History:  Procedure Laterality Date  . BACK SURGERY    . CYSTOSCOPY WITH URETEROSCOPY AND STENT PLACEMENT Left 11/06/2016   Procedure: CYSTOSCOPY WITH URETEROSCOPY AND STENT PLACEMENT retrograde pyelogram,holmium laser;  Surgeon: Irine Seal, MD;  Location: [facility] ORS;  Service: Urology;  Laterality: Left;  . EXTRACORPOREAL SHOCK WAVE LITHOTRIPSY Left 11/04/2016   Procedure: EXTRACORPOREAL SHOCK WAVE LITHOTRIPSY (ESWL);  Surgeon: Hollice Espy, MD;  Location: [facility] ORS;  Service: Urology;  Laterality: Left;    Allergies Patient has no known  allergies.  Social History Social History  Substance Use Topics  . Smoking status: Former Research scientist (life sciences)  . Smokeless tobacco: Former Systems developer  . Alcohol use No    Review of Systems Unknown, patient with altered mental status and presumed sepsis  All systems unknown except as stated in the HPI  ____________________________________________   PHYSICAL EXAM:  VITAL SIGNS: ED Triage Vitals  Enc Vitals Group     BP      Pulse      Resp      Temp      Temp src      SpO2      Weight      Height      Head Circumference      Peak Flow      Pain Score      Pain Loc      Pain Edu?      Excl. in [place]?    Constitutional: Lethargic and disoriented Eyes: Conjunctivae are normal.  ENT   Head: Normocephalic and atraumatic.   Nose: No congestion/rhinnorhea.   Mouth/Throat: Mucous membranes are moist.   Neck: No stridor. Cardiovascular: Normal rate, regular rhythm. No murmurs, rubs, or gallops. Respiratory: Tachypnea with mostly clear breath sounds Gastrointestinal: Soft and nontender. Normal bowel sounds Musculoskeletal: No specific edema, extensive left shoulder wound with foul smell Neurologic:  Normal speech and language. No gross focal neurologic deficits are appreciated.  Skin:  Erythema and drainage from a left shoulder wound with necrotic smell Psychiatric: Patient is poorly responsive ____________________________________________  EKG: Interpreted by me.Sinus  tachycardia with rate 123 bpm, right bundle branch block, left posterior fascicular block  ____________________________________________  ED COURSE:  Pertinent labs & imaging results that were available during my care of the patient were reviewed by me and considered in my medical decision making (see chart for details). Patient presents for altered mental status and likely sepsis, we will assess with labs and imaging as indicated. He does present with a DO NOT RESUSCITATE bracelet    Procedures ____________________________________________   LABS (pertinent positives/negatives)  Labs Reviewed  LACTIC ACID, PLASMA - Abnormal; Notable for the following:       Result Value   Lactic Acid, Venous 3.0 (*)    All other components within normal limits  CBC WITH DIFFERENTIAL/PLATELET - Abnormal; Notable for the following:    WBC 50.2 (*)    RBC 5.97 (*)    HCT 53.7 (*)    All other components within normal limits  CULTURE, BLOOD (ROUTINE X 2)  CULTURE, BLOOD (ROUTINE X 2)  URINE CULTURE  BLOOD GAS, VENOUS  LACTIC ACID, PLASMA  COMPREHENSIVE METABOLIC PANEL  TROPONIN I  URINALYSIS, COMPLETE (UACMP) WITH MICROSCOPIC   CRITICAL CARE Performed by: Earleen Newport   Total critical care time: 30 minutes  Critical care time was exclusive of separately billable procedures and treating other patients.  Critical care was necessary to treat or prevent imminent or life-threatening deterioration.  Critical care was time spent personally by me on the following activities: development of treatment plan with patient and/or surrogate as well as nursing, discussions with consultants, evaluation of patient's response to treatment, examination of patient, obtaining history from patient or surrogate, ordering and performing treatments and interventions, ordering and review of laboratory studies, ordering and review of radiographic studies, pulse oximetry and re-evaluation of patient's condition.  RADIOLOGY Images were viewed by me  Chest x-ray Is unremarkable for acute process IMPRESSION: 1. No acute cardiopulmonary abnormality. 2. Left shoulder region percutaneous drain. No acute osseous abnormality identified. ____________________________________________  FINAL ASSESSMENT AND PLAN  Altered mental status, sepsis, Marked leukocytosis, Acute renal failure, lactic acidosis  Plan: Patient's labs and imaging were dictated above. Patient had presented for altered mental  status likely secondary to encephalopathy from sepsis. He has been started on broad-spectrum antibiotics as well as IV fluid resuscitation. Current blood pressure is improved at 96/76 and he also has signs of acute renal failure of unknown clear etiology. He is a DO NOT RESUSCITATE but wife states he would be amenable to pressors if needed. Current lactic acid is elevated and this will need to be trended. He is currently on oral vancomycin at home. He remains in critical condition but appears to be improved somewhat with fluids and broad-spectrum antibiotics.   Earleen Newport, MD   Note: This note was generated in part or whole with voice recognition software. Voice recognition is usually quite accurate but there are transcription errors that can and very often do occur. I apologize for any typographical errors that were not detected and corrected.     Earleen Newport, MD 12/23/2016 1051    Earleen Newport, MD 12/27/2016 [phone]

## 2017-01-12 NOTE — Plan of Care (Signed)
Dr Fitzgerald at bedside.  

## 2017-01-12 NOTE — Consult Note (Signed)
[facility] Infectious Disease     Reason for Consult:Sepsis    Referring Physician: Governor Specking Date of Admission:  01/14/2017   Active Problems:   Sepsis associated hypotension ([place])   HPI: William Kennedy is a 76 y.o. male well known to me with hx CLL, recent L shoulder MSSA abscss s/p 2 I and Ds, brought in by wife for confusion and increased drainage from his abscess on back. He would found to have urine retention and ARF, wbc 50 and elevated lactate.   He was discharged home (despite recs for SNF) 8/6 on keflex for 3 more weaks. He was also to be on oral vancomycin and probiotics  He has been admitted to ICU. UA + TNTC WBC. [place] pending. cxr neg  Past Medical History:  Diagnosis Date  . Abscess    shoulder  . Leukemia [facility])    Past Surgical History:  Procedure Laterality Date  . BACK SURGERY    . CYSTOSCOPY WITH URETEROSCOPY AND STENT PLACEMENT Left 11/06/2016   Procedure: CYSTOSCOPY WITH URETEROSCOPY AND STENT PLACEMENT retrograde pyelogram,holmium laser;  Surgeon: Irine Seal, MD;  Location: [facility] ORS;  Service: Urology;  Laterality: Left;  . EXTRACORPOREAL SHOCK WAVE LITHOTRIPSY Left 11/04/2016   Procedure: EXTRACORPOREAL SHOCK WAVE LITHOTRIPSY (ESWL);  Surgeon: Hollice Espy, MD;  Location: [facility] ORS;  Service: Urology;  Laterality: Left;   Social History  Substance Use Topics  . Smoking status: Former Research scientist (life sciences)  . Smokeless tobacco: Former Systems developer  . Alcohol use No   Family History  Problem Relation Age of Onset  . Diabetes Mother   . Cancer Father     Allergies: No Known Allergies  Current antibiotics: Antibiotics Given (last 72 hours)    Date/Time Action Medication Dose Rate   01/13/2017 1021 New Bag/Given   piperacillin-tazobactam (ZOSYN) IVPB 3.375 g 3.375 g 100 mL/hr   12/23/2016 1024 New Bag/Given   vancomycin (VANCOCIN) IVPB 1000 mg/200 mL premix 1,000 mg 200 mL/hr      MEDICATIONS: . atorvastatin  40 mg Oral q1800  . clopidogrel  75 mg Oral Daily  .  docusate sodium  100 mg Oral BID  . feeding supplement (ENSURE ENLIVE)  237 mL Oral TID BM  . finasteride  5 mg Oral Daily  . heparin  5,000 Units Subcutaneous Q8H  . tamsulosin  0.4 mg Oral Daily  . vancomycin  125 mg Oral QID    Review of Systems - unable to obtain  OBJECTIVE: Temp:  [97.6 F (36.4 C)-97.9 F (36.6 C)] 97.6 F (36.4 C) (08/22 1400) Pulse Rate:  [36-121] 112 (08/22 1242) Resp:  [16-28] 16 (08/22 1242) BP: (81-119)/(44-86) 119/56 (08/22 1242) SpO2:  [89 %-100 %] 100 % (08/22 1242) Weight:  [94.1 kg (207 lb 6.4 oz)-104.3 kg (230 lb)] 94.1 kg (207 lb 6.4 oz) (08/22 1400) Physical Exam  Constitutional: lethargic, unable to arouse HENT: anicteric Mouth/Throat: Oropharynx is clear  Cardiovascular: tachycardiac  Back - large area of erythema over L upper shoulder with foul smelling drainage around penrose drain Pulmonary/Chest: Effort normal and breath sounds normal. No respiratory distress.  Abdominal: Soft. Bowel sounds are normal. He exhibits no distension. Lymphadenopathy: He has no cervical adenopathy.  Neurological:lethargic Skin: erythema on back as above Psychiatric: unable to assess  LABS: Results for orders placed or performed during the hospital encounter of 01/01/2017 (from the past 48 hour(s))  Lactic acid, plasma     Status: Abnormal   Collection Time: 12/28/2016  9:48 AM  Result Value Ref  Range   Lactic Acid, Venous 3.0 (HH) 0.5 - 1.9 mmol/L    Comment: CRITICAL RESULT CALLED TO, READ BACK BY AND VERIFIED WITH LAURA CATES_0  ON 01/21/2017 BY HKP   Comprehensive metabolic panel     Status: Abnormal   Collection Time: 01/15/2017  9:48 AM  Result Value Ref Range   Sodium 147 (H) 135 - 145 mmol/L   Potassium 4.4 3.5 - 5.1 mmol/L   Chloride 106 101 - 111 mmol/L   CO2 23 22 - 32 mmol/L   Glucose, Bld 106 (H) 65 - 99 mg/dL   BUN 108 (H) 6 - 20 mg/dL    Comment: RESULT CONFIRMED BY MANUAL DILUTION   Creatinine, Ser 5.26 (H) 0.61 - 1.24 mg/dL   Calcium  11.6 (H) 8.9 - 10.3 mg/dL   Total Protein 8.1 6.5 - 8.1 g/dL   Albumin 2.8 (L) 3.5 - 5.0 g/dL   AST 39 15 - 41 U/L   ALT 35 17 - 63 U/L   Alkaline Phosphatase 106 38 - 126 U/L   Total Bilirubin 0.8 0.3 - 1.2 mg/dL   GFR calc non Af Amer 10 (L) >60 mL/min   GFR calc Af Amer 11 (L) >60 mL/min    Comment: (NOTE) The eGFR has been calculated using the CKD EPI equation. This calculation has not been validated in all clinical situations. eGFR's persistently <60 mL/min signify possible Chronic Kidney Disease.    Anion gap 18 (H) 5 - 15  Troponin I     Status: None   Collection Time: 01/11/2017  9:48 AM  Result Value Ref Range   Troponin I <0.03 <0.03 ng/mL  CBC WITH DIFFERENTIAL     Status: Abnormal   Collection Time: 12/30/2016  9:48 AM  Result Value Ref Range   WBC 50.2 (HH) 3.8 - 10.6 K/uL    Comment: RESULT REPEATED AND VERIFIED CRITICAL RESULT CALLED TO, READ BACK BY AND VERIFIED WITH: LAURA CATES ON 12/25/2016 AT 1032 QSD    RBC 5.97 (H) 4.40 - 5.90 MIL/uL   Hemoglobin 17.9 13.0 - 18.0 g/dL   HCT 53.7 (H) 40.0 - 52.0 %   MCV 90.0 80.0 - 100.0 fL   MCH 30.0 26.0 - 34.0 pg   MCHC 33.4 32.0 - 36.0 g/dL   RDW 14.5 11.5 - 14.5 %   Platelets 386 150 - 440 K/uL   Neutrophils Relative % 62 %    Comment: PREVIOUS HISTORY OF CLL   Lymphocytes Relative 31 %    Comment: PREVIOUS HISTORY OF CLL   Monocytes Relative 6 %    Comment: PREVIOUS HISTORY OF CLL   Eosinophils Relative 1 %    Comment: PREVIOUS HISTORY OF CLL   Basophils Relative 0 %    Comment: PREVIOUS HISTORY OF CLL   Neutro Abs 31.1 (H) 1.4 - 6.5 K/uL    Comment: PREVIOUS HISTORY OF CLL   Lymphs Abs 15.6 (H) 1.0 - 3.6 K/uL    Comment: PREVIOUS HISTORY OF CLL   Monocytes Absolute 3.0 (H) 0.2 - 1.0 K/uL    Comment: PREVIOUS HISTORY OF CLL   Eosinophils Absolute 0.5 0 - 0.7 K/uL    Comment: PREVIOUS HISTORY OF CLL   Basophils Absolute 0.0 0 - 0.1 K/uL    Comment: PREVIOUS HISTORY OF CLL   WBC Morphology SMUDGE CELLS      Comment: PREVIOUS HISTORY OF CLL   Smear Review      PLATELET CLUMPS NOTED ON SMEAR, COUNT APPEARS ADEQUATE  Comment: PREVIOUS HISTORY OF CLL  Blood gas, venous ([facility], [facility], [facility])     Status: None (Preliminary result)   Collection Time: 01/04/2017  9:48 AM  Result Value Ref Range   pH, Ven 7.34 7.250 - 7.430   pCO2, Ven 45 44.0 - 60.0 mmHg   pO2, Ven PENDING 32.0 - 45.0 mmHg   Bicarbonate 24.3 20.0 - 28.0 mmol/L   Acid-base deficit 1.9 0.0 - 2.0 mmol/L   Patient temperature 37.0    Collection site VEIN    Sample type VEIN   Urinalysis, Complete w Microscopic     Status: Abnormal   Collection Time: 01/05/2017  9:48 AM  Result Value Ref Range   Color, Urine YELLOW (A) YELLOW   APPearance TURBID (A) CLEAR   Specific Gravity, Urine 1.017 1.005 - 1.030   pH 5.0 5.0 - 8.0   Glucose, UA NEGATIVE NEGATIVE mg/dL   Hgb urine dipstick LARGE (A) NEGATIVE   Bilirubin Urine NEGATIVE NEGATIVE   Ketones, ur NEGATIVE NEGATIVE mg/dL   Protein, ur 100 (A) NEGATIVE mg/dL   Nitrite NEGATIVE NEGATIVE   Leukocytes, UA MODERATE (A) NEGATIVE   RBC / HPF TOO NUMEROUS TO COUNT 0 - 5 RBC/hpf   WBC, UA TOO NUMEROUS TO COUNT 0 - 5 WBC/hpf   Bacteria, UA MANY (A) NONE SEEN   Squamous Epithelial / LPF NONE SEEN NONE SEEN   WBC Clumps PRESENT    Mucus PRESENT    Ca Oxalate Crys, UA PRESENT   Glucose, capillary     Status: None   Collection Time: 12/22/2016  2:02 PM  Result Value Ref Range   Glucose-Capillary 87 65 - 99 mg/dL   No components found for: ESR, C REACTIVE PROTEIN MICRO: Recent Results (from the past 720 hour(s))  Culture, blood (Routine x 2)     Status: None   Collection Time: 12/18/16 12:35 PM  Result Value Ref Range Status   Specimen Description BLOOD RIGHT ANTECUBITAL  Final   Special Requests   Final    BOTTLES DRAWN AEROBIC AND ANAEROBIC Blood Culture adequate volume   Culture NO GROWTH 5 DAYS  Final   Report Status 12/23/2016 FINAL  Final  Culture, blood (Routine x 2)     Status: None    Collection Time: 12/18/16 12:35 PM  Result Value Ref Range Status   Specimen Description BLOOD BLOOD LEFT WRIST  Final   Special Requests   Final    BOTTLES DRAWN AEROBIC AND ANAEROBIC Blood Culture adequate volume   Culture NO GROWTH 5 DAYS  Final   Report Status 12/23/2016 FINAL  Final  Urine culture     Status: None   Collection Time: 12/18/16 12:35 PM  Result Value Ref Range Status   Specimen Description URINE, RANDOM  Final   Special Requests NONE  Final   Culture   Final    NO GROWTH Performed at [facility], [address] [place], [place] [address]    Report Status 12/19/2016 FINAL  Final  C difficile quick scan w PCR reflex     Status: None   Collection Time: 12/18/16  6:02 PM  Result Value Ref Range Status   C Diff antigen NEGATIVE NEGATIVE Final   C Diff toxin NEGATIVE NEGATIVE Final   C Diff interpretation No C. difficile detected.  Final  MRSA PCR Screening     Status: None   Collection Time: 12/19/16 11:24 AM  Result Value Ref Range Status   MRSA by PCR  NEGATIVE NEGATIVE Final    Comment:        The GeneXpert MRSA Assay (FDA approved for NASAL specimens only), is one component of a comprehensive MRSA colonization surveillance program. It is not intended to diagnose MRSA infection nor to guide or monitor treatment for MRSA infections.   Aerobic/Anaerobic Culture (surgical/deep wound)     Status: None   Collection Time: 12/21/16 12:28 PM  Result Value Ref Range Status   Specimen Description BACK  Final   Special Requests Immunocompromised  Final   Gram Stain   Final    ABUNDANT WBC PRESENT, PREDOMINANTLY PMN NO ORGANISMS SEEN    Culture   Final    No growth aerobically or anaerobically. Performed at [facility], [place] [address], [place], [place] [address]    Report Status 12/26/2016 FINAL  Final    IMAGING: Dg Chest 2 View  Result Date: 12/18/2016 CLINICAL DATA:  Code sepsis.  Altered mental status. EXAM: CHEST  2 VIEW COMPARISON:   11/30/2016 FINDINGS: Heart and mediastinal contours are within normal limits. No focal opacities or effusions. No acute bony abnormality. IMPRESSION: No active cardiopulmonary disease. Electronically Signed   By: Rolm Baptise M.D.   On: 12/18/2016 15:01   Ct Head Wo Contrast  Result Date: 12/24/2016 CLINICAL DATA:  76 year old male with left facial droop, right arm and leg weakness. Left MCA and PCA territory infarcts suspected on brain MRI yesterday. EXAM: CT HEAD WITHOUT CONTRAST TECHNIQUE: Contiguous axial images were obtained from the base of the skull through the vertex without intravenous contrast. COMPARISON:  Brain MRI 12/23/2016, head CT 12/23/2016. FINDINGS: Brain: Acute on chronic left lateral occipital evolving infarct and and subtle acute superior left MCA cortical and white matter infarct corresponding to the MRI findings yesterday. No associated hemorrhage or mass effect. Minimal increased hypodensity on CT compared to 12/23/2016. Stable gray-white matter differentiation elsewhere with confluent bilateral chronic white matter hypodensity. No intracranial mass effect. Stable ventricle size and configuration. Vascular: Calcified atherosclerosis at the skull base. Skull: No acute osseous abnormality identified. Sinuses/Orbits: stable and well pneumatized aside from bubbly opacity in the left sphenoid. Other: No acute orbit or scalp soft tissue findings. IMPRESSION: 1. 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 No associated hemorrhage or mass effect. 2. No new intracranial abnormality. Electronically Signed   By: Genevie Ann M.D.   On: 12/24/2016 12:20   Ct Head Wo Contrast  Result Date: 12/23/2016 CLINICAL DATA:  New onset right arm weakness. EXAM: CT HEAD WITHOUT CONTRAST TECHNIQUE: Contiguous axial images were obtained from the base of the skull through the vertex without intravenous contrast. COMPARISON:  11/30/2016 FINDINGS: Brain: Diffuse cerebral  atrophy. Mild ventricular dilatation consistent with central atrophy. Low-attenuation changes in the deep white matter consistent small vessel ischemia. Cavum septum pellucidum. Small area of old encephalomalacia in the left posterior parietal lobe unchanged since previous study. No evidence of acute infarction, hemorrhage, hydrocephalus, extra-axial collection or mass lesion/mass effect. Vascular: Vascular calcifications are present. Skull: No depressed skull fractures. Sinuses/Orbits: Paranasal sinuses and mastoid air cells are clear. Other: No change since previous study. IMPRESSION: No acute intracranial abnormalities. Chronic atrophy and small vessel ischemic changes. 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 No change since previous studies. Electronically Signed   By: Lucienne Capers M.D.   On: 12/23/2016 03:35   Mr [name] Head Wo Contrast  Addendum Date: 12/23/2016   ADDENDUM REPORT: 12/23/2016  11:20 ADDENDUM: Study discussed by telephone with Dr. Gladstone Lighter on 12/23/2016 at 1113 hours. Electronically Signed   By: Genevie Ann M.D.   On: 12/23/2016 11:20   Result Date: 12/23/2016 CLINICAL DATA:  76 year old male recently treated for sepsis. New onset right upper extremity weakness upon waking at 0300 hours. Weakness progressing to the right leg. Subsequent dense right side hemiplegia. EXAM: MRI HEAD WITHOUT CONTRAST MRA HEAD WITHOUT CONTRAST MRA NECK WITHOUT AND WITH CONTRAST TECHNIQUE: Multiplanar, multiecho pulse sequences of the brain and surrounding structures were obtained without intravenous contrast. Angiographic images of the Circle of Willis were obtained using MRA technique without intravenous contrast. Angiographic images of the neck were obtained using MRA technique without and with intravenous contrast. Carotid stenosis measurements (when applicable) are obtained utilizing NASCET criteria, using the distal internal carotid diameter as the denominator. CONTRAST:  63m  MULTIHANCE GADOBENATE DIMEGLUMINE 529 MG/ML IV SOLN COMPARISON:  Head CT without contrast 0323 hours today. FINDINGS: MRI HEAD FINDINGS Brain: Patchy acute areas of restricted diffusion in the left hemisphere including the left superior frontal gyrus, motor strip, sensory strip, pre motor area, left centrum semiovale, and also in the left superior occipital lobe and occipital pole cortex. No contralateral right hemisphere or posterior fossa restricted diffusion identified. T2 and FLAIR hyperintensity in the affected areas with no associated hemorrhage or mass effect. Underlying inferior left parietal lobe cortical encephalomalacia and hemosiderin (series 14, image 15). Patchy and confluent bilateral superimposed cerebral white matter T2 and FLAIR hyperintensity. Small chronic microhemorrhage also in the right occipital lobe white matter. 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 Vascular: Absent flow void in the distal right vertebral artery, preserved distal left vertebral artery and basilar flow void. ICA siphon flow voids are preserved. Cavum septum pellucidum anatomic variant. No ventriculomegaly. No midline shift. Skull and upper cervical spine: Negative. Sinuses/Orbits: Negative aside from mild mucosal thickening and/or fluid in the left sphenoid sinus. Other: Trace right mastoid fluid. Visible internal auditory structures appear normal. Negative scalp soft tissues. MRA NECK FINDINGS Precontrast time-of-flight images demonstrate asymmetrically decreased antegrade flow signal in the cervical left ICA. Bilateral CCA flow signal appears fairly symmetric. Preserved right ICA flow signal. Flow signal in the left vertebral artery which appears dominant in the neck. Post-contrast MRA images reveal a radiographic string sign stenosis at the left ICA origin, and highly irregular contour of the left ICA bulb with additional mild stenosis at the distal bulb level. See series 26, image 52 and  series 110, image 16. Despite these findings there is preserved enhancement of the left ICA to the skullbase and into the left ICA siphon. Only mild irregularity of the right CCA and cervical right ICA. Proximal subclavian arteries are patent but irregular. The right vertebral artery is occluded from its origin to the V3 segment with only faint enhancement in the V4 segment and at the right PICA 0 which may be retrograde. There is mild stenosis at the left vertebral artery origin. The left vertebral artery is otherwise normal to the vertebrobasilar junction. MRA HEAD FINDINGS Absent antegrade flow in the distal right vertebral artery. Preserved flow in the distal left vertebral artery and basilar. SCA and right PCA origins are patent. The left P1 segment is irregular and stenotic. Left posterior communicating artery appears to be the primary source of left PCA flow the left P2 segment is normal. Left P3 branches are attenuated. Right PCA branches have a more normal appearance. Right posterior communicating artery is diminutive  or absent. Antegrade flow in the right ICA siphon to the right terminus with no stenosis. Patent right MCA and ACA origins. Decreased antegrade flow signal in the vertical petrous and proximal cavernous segment of the left ICA siphon (series 104, image 6), but the supraclinoid segment and left ICA to sinus are patent. Left MCA origin and M1 segment are patent with only mild irregularity. Left MCA bifurcation is patent. No left MCA branch occlusion is identified. The left ACA origin is severely stenotic. Patent anterior communicating artery. A2 segments appear symmetric and within normal limits. Moderate stenosis at the right MCA origin, but visible right MCA branches are otherwise within normal limits. IMPRESSION: 1. 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 No associated hemorrhage or mass effect. 2. MRA is positive for high-grade Radiographic String Sign stenosis at the  left ICA origin, with tandem severe stenosis or thrombosis in the mid left ICA siphon. Patent left MCA, and no left MCA branch occlusion identified. 3. Proximal left PCA P1 stenosis with left posterior communicating artery contribution to the left PCA, corresponding to the left PCA territory involvement. 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 Electronically Signed: By: Genevie Ann M.D. On: 12/23/2016 10:56   Mr [name] Neck W Wo Contrast  Addendum Date: 12/23/2016   ADDENDUM REPORT: 12/23/2016 11:20 ADDENDUM: Study discussed by telephone with Dr. Gladstone Lighter on 12/23/2016 at 1113 hours. Electronically Signed   By: Genevie Ann M.D.   On: 12/23/2016 11:20   Result Date: 12/23/2016 CLINICAL DATA:  76 year old male recently treated for sepsis. New onset right upper extremity weakness upon waking at 0300 hours. Weakness progressing to the right leg. Subsequent dense right side hemiplegia. EXAM: MRI HEAD WITHOUT CONTRAST MRA HEAD WITHOUT CONTRAST MRA NECK WITHOUT AND WITH CONTRAST TECHNIQUE: Multiplanar, multiecho pulse sequences of the brain and surrounding structures were obtained without intravenous contrast. Angiographic images of the Circle of Willis were obtained using MRA technique without intravenous contrast. Angiographic images of the neck were obtained using MRA technique without and with intravenous contrast. Carotid stenosis measurements (when applicable) are obtained utilizing NASCET criteria, using the distal internal carotid diameter as the denominator. CONTRAST:  20m MULTIHANCE GADOBENATE DIMEGLUMINE 529 MG/ML IV SOLN COMPARISON:  Head CT without contrast 0323 hours today. FINDINGS: MRI HEAD FINDINGS Brain: Patchy acute areas of restricted diffusion in the left hemisphere including the left superior frontal gyrus, motor strip, sensory strip, pre motor area, left centrum semiovale, and also in the left superior  occipital lobe and occipital pole cortex. No contralateral right hemisphere or posterior fossa restricted diffusion identified. T2 and FLAIR hyperintensity in the affected areas with no associated hemorrhage or mass effect. Underlying inferior left parietal lobe cortical encephalomalacia and hemosiderin (series 14, image 15). Patchy and confluent bilateral superimposed cerebral white matter T2 and FLAIR hyperintensity. Small chronic microhemorrhage also in the right occipital lobe white matter. 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 Vascular: Absent flow void in the distal right vertebral artery, preserved distal left vertebral artery and basilar flow void. ICA siphon flow voids are preserved. Cavum septum pellucidum anatomic variant. No ventriculomegaly. No midline shift. Skull and upper cervical spine: Negative. Sinuses/Orbits: Negative aside from mild mucosal thickening and/or fluid in the left sphenoid sinus. Other: Trace right mastoid fluid. Visible internal auditory structures appear normal. Negative scalp soft tissues. MRA NECK FINDINGS Precontrast time-of-flight images demonstrate asymmetrically decreased antegrade flow signal  in the cervical left ICA. Bilateral CCA flow signal appears fairly symmetric. Preserved right ICA flow signal. Flow signal in the left vertebral artery which appears dominant in the neck. Post-contrast MRA images reveal a radiographic string sign stenosis at the left ICA origin, and highly irregular contour of the left ICA bulb with additional mild stenosis at the distal bulb level. See series 26, image 52 and series 110, image 16. Despite these findings there is preserved enhancement of the left ICA to the skullbase and into the left ICA siphon. Only mild irregularity of the right CCA and cervical right ICA. Proximal subclavian arteries are patent but irregular. The right vertebral artery is occluded from its origin to the V3 segment with only faint  enhancement in the V4 segment and at the right PICA 0 which may be retrograde. There is mild stenosis at the left vertebral artery origin. The left vertebral artery is otherwise normal to the vertebrobasilar junction. MRA HEAD FINDINGS Absent antegrade flow in the distal right vertebral artery. Preserved flow in the distal left vertebral artery and basilar. SCA and right PCA origins are patent. The left P1 segment is irregular and stenotic. Left posterior communicating artery appears to be the primary source of left PCA flow the left P2 segment is normal. Left P3 branches are attenuated. Right PCA branches have a more normal appearance. Right posterior communicating artery is diminutive or absent. Antegrade flow in the right ICA siphon to the right terminus with no stenosis. Patent right MCA and ACA origins. Decreased antegrade flow signal in the vertical petrous and proximal cavernous segment of the left ICA siphon (series 104, image 6), but the supraclinoid segment and left ICA to sinus are patent. Left MCA origin and M1 segment are patent with only mild irregularity. Left MCA bifurcation is patent. No left MCA branch occlusion is identified. The left ACA origin is severely stenotic. Patent anterior communicating artery. A2 segments appear symmetric and within normal limits. Moderate stenosis at the right MCA origin, but visible right MCA branches are otherwise within normal limits. IMPRESSION: 1. 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 No associated hemorrhage or mass effect. 2. 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 Patent left MCA, and no left MCA branch occlusion identified. 3. Proximal left PCA P1 stenosis with left posterior communicating artery contribution to the left PCA, corresponding to the left PCA territory involvement. Underlying chronic left PCA infarct with  hemosiderin. 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 Electronically Signed: By: Genevie Ann M.D. On: 12/23/2016 10:56   Mr Brain Wo Contrast  Addendum Date: 12/23/2016   ADDENDUM REPORT: 12/23/2016 11:20 ADDENDUM: Study discussed by telephone with Dr. Gladstone Lighter on 12/23/2016 at 1113 hours. Electronically Signed   By: Genevie Ann M.D.   On: 12/23/2016 11:20   Result Date: 12/23/2016 CLINICAL DATA:  76 year old male recently treated for sepsis. New onset right upper extremity weakness upon waking at 0300 hours. Weakness progressing to the right leg. Subsequent dense right side hemiplegia. EXAM: MRI HEAD WITHOUT CONTRAST MRA HEAD WITHOUT CONTRAST MRA NECK WITHOUT AND WITH CONTRAST TECHNIQUE: Multiplanar, multiecho pulse sequences of the brain and surrounding structures were obtained without intravenous contrast. Angiographic images of the Circle of Willis were obtained using MRA technique without intravenous contrast. Angiographic images of the neck were obtained using MRA technique without and with  intravenous contrast. Carotid stenosis measurements (when applicable) are obtained utilizing NASCET criteria, using the distal internal carotid diameter as the denominator. CONTRAST:  51m MULTIHANCE GADOBENATE DIMEGLUMINE 529 MG/ML IV SOLN COMPARISON:  Head CT without contrast 0323 hours today. FINDINGS: MRI HEAD FINDINGS Brain: Patchy acute areas of restricted diffusion in the left hemisphere including the left superior frontal gyrus, motor strip, sensory strip, pre motor area, left centrum semiovale, and also in the left superior occipital lobe and occipital pole cortex. No contralateral right hemisphere or posterior fossa restricted diffusion identified. T2 and FLAIR hyperintensity in the affected areas with no associated hemorrhage or mass effect. Underlying inferior left parietal lobe cortical encephalomalacia and hemosiderin (series 14, image 15). Patchy and confluent  bilateral superimposed cerebral white matter T2 and FLAIR hyperintensity. Small chronic microhemorrhage also in the right occipital lobe white matter. 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 Vascular: Absent flow void in the distal right vertebral artery, preserved distal left vertebral artery and basilar flow void. ICA siphon flow voids are preserved. Cavum septum pellucidum anatomic variant. No ventriculomegaly. No midline shift. Skull and upper cervical spine: Negative. Sinuses/Orbits: Negative aside from mild mucosal thickening and/or fluid in the left sphenoid sinus. Other: Trace right mastoid fluid. Visible internal auditory structures appear normal. Negative scalp soft tissues. MRA NECK FINDINGS Precontrast time-of-flight images demonstrate asymmetrically decreased antegrade flow signal in the cervical left ICA. Bilateral CCA flow signal appears fairly symmetric. Preserved right ICA flow signal. Flow signal in the left vertebral artery which appears dominant in the neck. Post-contrast MRA images reveal a radiographic string sign stenosis at the left ICA origin, and highly irregular contour of the left ICA bulb with additional mild stenosis at the distal bulb level. See series 26, image 52 and series 110, image 16. Despite these findings there is preserved enhancement of the left ICA to the skullbase and into the left ICA siphon. Only mild irregularity of the right CCA and cervical right ICA. Proximal subclavian arteries are patent but irregular. The right vertebral artery is occluded from its origin to the V3 segment with only faint enhancement in the V4 segment and at the right PICA 0 which may be retrograde. There is mild stenosis at the left vertebral artery origin. The left vertebral artery is otherwise normal to the vertebrobasilar junction. MRA HEAD FINDINGS Absent antegrade flow in the distal right vertebral artery. Preserved flow in the distal left vertebral artery and  basilar. SCA and right PCA origins are patent. The left P1 segment is irregular and stenotic. Left posterior communicating artery appears to be the primary source of left PCA flow the left P2 segment is normal. Left P3 branches are attenuated. Right PCA branches have a more normal appearance. Right posterior communicating artery is diminutive or absent. Antegrade flow in the right ICA siphon to the right terminus with no stenosis. Patent right MCA and ACA origins. Decreased antegrade flow signal in the vertical petrous and proximal cavernous segment of the left ICA siphon (series 104, image 6), but the supraclinoid segment and left ICA to sinus are patent. Left MCA origin and M1 segment are patent with only mild irregularity. Left MCA bifurcation is patent. No left MCA branch occlusion is identified. The left ACA origin is severely stenotic. Patent anterior communicating artery. A2 segments appear symmetric and within normal limits. Moderate stenosis at the right MCA origin, but visible right MCA branches are otherwise within normal limits. IMPRESSION: 1. Scattered acute infarcts in the left MCA and  PCA territory. No associated hemorrhage or mass effect. 2. 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 Patent left MCA, and no left MCA branch occlusion identified. 3. Proximal left PCA P1 stenosis with left posterior communicating artery contribution to the left PCA, corresponding to the left PCA territory involvement. 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 Electronically Signed: By: Genevie Ann M.D. On: 12/23/2016 10:56   Dg Chest Port 1 View  Result Date: 12/27/2016 CLINICAL DATA:  76 year old male with increasing lethargy, now minimally responsive. Draining wound to left shoulder, status post incision and  drainage of subcutaneous abscess of the upper back on 12/21/2016. Fever. EXAM: PORTABLE CHEST 1 VIEW COMPARISON:  Chest radiographs 12/18/2016 and earlier. FINDINGS: Portable AP supine view at 0958 hours. Percutaneous drain projects over the left axilla and scapula. No subcutaneous gas identified. Stable lung volumes. Allowing for portable technique the lungs are clear. Mediastinal contours remain within normal limits. Visualized tracheal air column is within normal limits. No acute osseous abnormality identified. IMPRESSION: 1.  No acute cardiopulmonary abnormality. 2. Left shoulder region percutaneous drain. No acute osseous abnormality identified. Electronically Signed   By: Genevie Ann M.D.   On: 01/02/2017 10:19    Assessment:   William Kennedy is a 76 y.o. male readmitted with sepsis and ARF in setting of several recent admission for L upper shoulder abscess, prior diverticulitis, C diff, recent CVA and urinary retention with foley in place.  He also has CLL with baseline WBC around 25.  He was on keflex at home but wife said it was hard for her to get him to take it. He is now lethargic and hypotensive with Cr 5, wbc 50  and found to have a clogged foley. THe wound on his back still has foul smelling drainage but is not really much worse than prior.  He is likely in ARF from the foley obstruction but also appears to have UTI.   Recommendations Cont zosyn Cont oral vanco for C diff Stop IV vanco pending cxs  Thank you very much for allowing me to participate in the care of this patient. Please call with questions.   Cheral Marker. Ola Spurr, MD

## 2017-01-12 NOTE — Progress Notes (Signed)
Peripherally Inserted Central Catheter/Midline Placement  The IV Nurse has discussed with the patient and/or persons authorized to consent for the patient, the purpose of this procedure and the potential benefits and risks involved with this procedure.  The benefits include less needle sticks, lab draws from the catheter, and the patient may be discharged home with the catheter. Risks include, but not limited to, infection, bleeding, blood clot (thrombus formation), and puncture of an artery; nerve damage and irregular heartbeat and possibility to perform a PICC exchange if needed/ordered by physician.  Alternatives to this procedure were also discussed.  Bard Power PICC patient education guide, fact sheet on infection prevention and patient information card has been provided to patient /or left at bedside.    PICC/Midline Placement Documentation  PICC Triple Lumen 94/44/61 PICC Left Basilic 51 cm 1 cm (Active)  Indication for Insertion or Continuance of Line Prolonged intravenous therapies 12/24/2016  6:00 PM  Exposed Catheter (cm) 1 cm 01/09/2017  6:00 PM  Dressing Change Due 01/19/17 12/27/2016  6:00 PM       Jule Economy Horton 12/31/2016, 6:55 PM

## 2017-01-12 NOTE — ED Triage Notes (Signed)
Patient from home via [facility]. Per EMS, family states patient has been becoming increasingly lethargic for several days, this morning noticed he was not responding. Upon arrival to ED, patient is responsive only to painful stimuli and mumbling incoherently. Patient has draining wound to left shoulder with pin rose drain in place. Patient also have foley catheter in place. Patient on home antibiotics for infection.

## 2017-01-12 NOTE — H&P (Addendum)
[facility] NAME: William Kennedy    MR#:  [id number]  DATE OF BIRTH:  1940/06/24  DATE OF ADMISSION:  01/07/2017  PRIMARY CARE PHYSICIAN: [name], MD   REQUESTING/REFERRING PHYSICIAN: Dr. Lenise Arena  CHIEF COMPLAINT: Altered mental status    Chief Complaint  Patient presents with  . Code Sepsis    HISTORY OF PRESENT ILLNESS:  William Kennedy  is a 76 y.o. male with a known history of Previous cerebral infarct, abscess of left shoulder with Penrose drains and on oral antibiotics brought in by wife because of altered mental status, patient was very confused since yesterday. Wife noticed that significant increase in drainage from left shoulder abscess since yesterday night. She is unable to get him to go to doctor's appointment and she noticed that he is very confused and also taking out his Foley catheter. Patient found to have urinary retention up to 1 L on bladder scan in the emergency room. Foley change in the ER today. Patient found to have acute renal failure had been 5.26, white count up to 50, lactic acid 3. Wife wanted treatment for sepsis with IV antibiotics and also pressors if needed. And she would like to try the treatment for at least 1-2 days and see if it helps him otherwise he will talk with palliative care and hospice. Patient is moaning in pain and unable to tell me where he is hurting. Wife also says that the he is having foul-smelling drainage from his left shoulder abscess. Patient has been followed by Dr. Leodis Binet, patient still has 2 Penrose drains. Still has copious drainage from left upper back.  PAST MEDICAL HISTORY:   Past Medical History:  Diagnosis Date  . Abscess    shoulder  . Leukemia ([place])     PAST SURGICAL HISTOIRY:   Past Surgical History:  Procedure Laterality Date  . BACK SURGERY    . CYSTOSCOPY WITH URETEROSCOPY AND STENT PLACEMENT Left 11/06/2016   Procedure:  CYSTOSCOPY WITH URETEROSCOPY AND STENT PLACEMENT retrograde pyelogram,holmium laser;  Surgeon: Irine Seal, MD;  Location: [facility] ORS;  Service: Urology;  Laterality: Left;  . EXTRACORPOREAL SHOCK WAVE LITHOTRIPSY Left 11/04/2016   Procedure: EXTRACORPOREAL SHOCK WAVE LITHOTRIPSY (ESWL);  Surgeon: Hollice Espy, MD;  Location: [facility] ORS;  Service: Urology;  Laterality: Left;    SOCIAL HISTORY:   Social History  Substance Use Topics  . Smoking status: Former Research scientist (life sciences)  . Smokeless tobacco: Former Systems developer  . Alcohol use No    FAMILY HISTORY:   Family History  Problem Relation Age of Onset  . Diabetes Mother   . Cancer Father     DRUG ALLERGIES:  No Known Allergies  REVIEW OF SYSTEMS:Unable to obtain review of systems because of lethargy.    MEDICATIONS AT HOME:   Prior to Admission medications   Medication Sig Start Date End Date Taking? Authorizing Provider  acetaminophen (TYLENOL) 325 MG tablet Take 650 mg by mouth every 4 (four) hours as needed for pain.   Yes [provider]  aspirin 81 MG chewable tablet Chew 1 tablet (81 mg total) by mouth daily. 12/28/16  Yes Gladstone Lighter, MD  atorvastatin (LIPITOR) 40 MG tablet Take 1 tablet (40 mg total) by mouth daily at 6 PM. 12/27/16  Yes Gladstone Lighter, MD  cephALEXin (KEFLEX) 500 MG capsule Take 1 capsule (500 mg total) by mouth 3 (three) times daily. X 20 days 12/27/16  Yes Gladstone Lighter, MD  clopidogrel (PLAVIX) 75 MG tablet Take 1 tablet (75 mg total) by mouth daily. 12/28/16  Yes Gladstone Lighter, MD  feeding supplement, ENSURE ENLIVE, (ENSURE ENLIVE) LIQD Take 237 mLs by mouth 3 (three) times daily between meals. 12/03/16  Yes Vaughan Basta, MD  finasteride (PROSCAR) 5 MG tablet Take 1 tablet (5 mg total) by mouth daily. 12/04/16  Yes Vaughan Basta, MD  loperamide (IMODIUM) 2 MG capsule Take 1 capsule (2 mg total) by mouth every 6 (six) hours as needed for diarrhea or loose stools. 12/03/16  Yes  Vaughan Basta, MD  metoprolol tartrate (LOPRESSOR) 25 MG tablet Take 0.5 tablets (12.5 mg total) by mouth 2 (two) times daily. 12/03/16  Yes Vaughan Basta, MD  ondansetron (ZOFRAN) 4 MG tablet Take 4 mg by mouth every 8 (eight) hours as needed for nausea. 10/28/16  Yes [provider]  Saccharomyces boulardii (PROBIOTIC) 250 MG CAPS Take 1 capsule by mouth daily. X 3 weeks 12/27/16  Yes Gladstone Lighter, MD  tamsulosin (FLOMAX) 0.4 MG CAPS capsule Take 1 capsule (0.4 mg total) by mouth daily. 12/04/16  Yes Vaughan Basta, MD  traMADol (ULTRAM) 50 MG tablet Take 50 mg by mouth 3 (three) times daily as needed for moderate pain or severe pain.   Yes [provider]  vancomycin (VANCOCIN) 125 MG capsule Take 1 capsule (125 mg total) by mouth 4 (four) times daily. X 4 weeks 12/27/16  Yes Gladstone Lighter, MD  HYDROcodone-acetaminophen (NORCO/VICODIN) 5-325 MG tablet Take 1 tablet by mouth every 4 (four) hours as needed for moderate pain. Patient not taking: Reported on 01/20/2017 12/27/16   Gladstone Lighter, MD  lidocaine (LIDODERM) 5 % Place 1 patch onto the skin daily. Remove & Discard patch within 12 hours or as directed by MD Patient not taking: Reported on 01/11/2017 12/28/16   Gladstone Lighter, MD  lidocaine (XYLOCAINE) 5 % ointment Apply 1 application topically as needed. Apply to penile area Patient not taking: Reported on 01/20/2017 12/27/16   Gladstone Lighter, MD  opium-belladonna (B&O SUPPRETTES) 16.2-60 MG suppository Place 1 suppository rectally 2 (two) times daily as needed for bladder spasms. Patient not taking: Reported on 01/14/2017 12/03/16   Vaughan Basta, MD      VITAL SIGNS:  Blood pressure (!) 119/56, pulse (!) 112, temperature 97.9 F (36.6 C), temperature source Rectal, resp. rate 16, height 6\' 3"  (1.905 m), weight 104.3 kg (230 lb), SpO2 100 %.  PHYSICAL EXAMINATION:  GENERAL:  76 y.o.-year-old patient lying in the bed moaning and  seems to be uncomfortable. EYES: Pupils equal, round, reactive to light . No scleral icterus. Extraocular muscles intact.  HEENT: Head atraumatic, normocephalic. Oropharynx and nasopharynx clear.  NECK:  Supple, no jugular venous distention. No thyroid enlargement, no tenderness.  LUNGS: Normal breath sounds bilaterally, no wheezing, rales,rhonchi or crepitation. No use of accessory muscles of respiration.  CARDIOVASCULAR: S1, S2 normal. No murmurs, rubs, or gallops.  ABDOMEN: Soft, nontender, nondistended. Bowel sounds present. No organomegaly or mass.  EXTREMITIES:Patient has extensive left shoulder wound with  foul smelling drainage .  NEUROLOGIC; has left facial droop, decreased strength in the right upper and lower extremities. Due to previous stroke. PSYCHIATRIC: The patient is lethargic.  Patient has tender area in the left scapula with drainage and also dressings are soaked.Marland Kitchen  LABORATORY PANEL:   CBC  Recent Labs Lab 12/31/2016 0948  WBC 50.2*  HGB 17.9  HCT 53.7*  PLT 386   ------------------------------------------------------------------------------------------------------------------  Chemistries   Recent Labs Lab 01/09/2017 [phone]  NA 147*  K 4.4  CL 106  CO2 23  GLUCOSE 106*  BUN 108*  CREATININE 5.26*  CALCIUM 11.6*  AST 39  ALT 35  ALKPHOS 106  BILITOT 0.8   ------------------------------------------------------------------------------------------------------------------  Cardiac Enzymes  Recent Labs Lab 01/04/2017 0948  TROPONINI <0.03   ------------------------------------------------------------------------------------------------------------------  RADIOLOGY:  Dg Chest Port 1 View  Result Date: 01/17/2017 CLINICAL DATA:  76 year old male with increasing lethargy, now minimally responsive. Draining wound to left shoulder, status post incision and drainage of subcutaneous abscess of the upper back on 12/21/2016. Fever. EXAM: PORTABLE CHEST 1 VIEW  COMPARISON:  Chest radiographs 12/18/2016 and earlier. FINDINGS: Portable AP supine view at 0958 hours. Percutaneous drain projects over the left axilla and scapula. No subcutaneous gas identified. Stable lung volumes. Allowing for portable technique the lungs are clear. Mediastinal contours remain within normal limits. Visualized tracheal air column is within normal limits. No acute osseous abnormality identified. IMPRESSION: 1.  No acute cardiopulmonary abnormality. 2. Left shoulder region percutaneous drain. No acute osseous abnormality identified. Electronically Signed   By: Genevie Ann M.D.   On: 12/28/2016 10:19    EKG:   Orders placed or performed during the hospital encounter of 01/01/2017  . EKG 12-Lead  . EKG 12-Lead   EKG shows sinus tachycardia with 123 beats per minute, right bundle branch block, left posterior fascicular block.  IMPRESSION AND PLAN:   #1 altered mental status due to sepsis with elevated lactic acid up to 3, source of sepsis likely from left upper back abscess. Reconsult surgery today. Patient was seen by Dr. Charissa Bash on August 12 and patient supposed to see Dr. Leodis Binet today anyway. But the wife unable to take him to appointment because he was so little lethargic. Consult surgery today because patient still has abscess with foul-smelling drainage and Penrose drains.  Sepsis patient is on vancomycin, Zosyn. Consult ID. 3. Acute renal failure; likely ATN  due to sepsis but unable to rule out also postobstructive uropathy: Patient had urine retention, 1 L of urine is drained in the ER. Continue fluids, monitor kidney function, avoid nephrotoxic agents patient already received 2 L of normal saline in the emergency room. #4 history of CLL but white count is 50 and lactic acid is 3 with evidence of sepsis. #5./ih/o acute left hemispheric CVA, discharge from hospital on August 6 and patient went home and did not want to go to rehabilitation as per physical therapy  recommendation. Patient on aspirin, Plavix, statins, continue them.  6 history of BPH, urinary retention: Patient is seen by urology during last admission, continue Flomax, finasteride, continue Foley on it or urine output. 7/ prognosis poor, wife wants to try at least 1-2 days before considering comfort care. Patient was seen by palliative care, hospice before.  allhe records are reviewed and case discussed with ED provider. Management plans discussed with the patient, family and they are in agreement.  CODE STATUS:DNR  TOTAL TIME TAKING CARE OF THIS PATIENT: 12minutes. (CCT)   Pearl Berlinger M.D on 01/03/2017 at 1:41 PM  Between 7am to 6pm - Pager - [phone]  After 6pm go to [email or web address] - password [email or web address] [facility]  [facility]  Office  [phone]  CC: Primary care physician; [name], MD  Note: This dictation was prepared with Dragon dictation along with smaller phrase technology. Any transcriptional errors that result from this process are unintentional.

## 2017-01-12 NOTE — Consult Note (Signed)
Name: William Kennedy MRN: [id number] DOB: [birth year]    ADMISSION DATE:  01/08/2017 CONSULTATION DATE: 01/07/2017  REFERRING MD : Dr. Vianne Bulls   CHIEF COMPLAINT: Lethargy   BRIEF PATIENT DESCRIPTION:  76 yo male admitted 08/22 with acute encephalopathy, lactic acidosis, and sepsis secondary to left upper back abscess s/p penrose drain placement post incision and drainage (12/20/16) and UTI  SIGNIFICANT EVENTS  08/22-Pt admitted to [facility] Unit   STUDIES:  None   HISTORY OF PRESENT ILLNESS:   This is a 76 year old male with a past medical history of the left upper back subcutaneous abscess, chronic indwelling foley catheter due to BPH, cdiff colitis, cerebral infarction, and CLL.  He presented to [facility] ER 08/22 via EMS due to family's concerns patient increasingly lethargic over the past several days and on the morning of 08/22 he was minimally responsive.  According to the patient's wife he is currently on home antibiotics due to a  left upper back subcutaneous abscess with penrose drain in place with foul smelling thick drainage.  He was also recently diagnosed with Cdiff 12/18/16 and discharged on oral vancomycin to be completed for a course of 31 days. In the ER lab results revealed Na+ 147, BUN 108, creatinine 5.26, and wbc 50.2 ruling pt in for sepsis. Therefore, he was subsequently admitted to the stepdown unit by hospitalist team for further workup and treatment PCCM consulted.  PAST MEDICAL HISTORY :   has a past medical history of Abscess and Leukemia ([place]).  has a past surgical history that includes Back surgery; Extracorporeal shock wave lithotripsy (Left, 11/04/2016); and Cystoscopy with ureteroscopy and stent placement (Left, 11/06/2016). Prior to Admission medications   Medication Sig Start Date End Date Taking? Authorizing Provider  acetaminophen (TYLENOL) 325 MG tablet Take 650 mg by mouth every 4 (four) hours as needed for pain.   Yes [provider]  aspirin 81  MG chewable tablet Chew 1 tablet (81 mg total) by mouth daily. 12/28/16  Yes Gladstone Lighter, MD  atorvastatin (LIPITOR) 40 MG tablet Take 1 tablet (40 mg total) by mouth daily at 6 PM. 12/27/16  Yes Gladstone Lighter, MD  cephALEXin (KEFLEX) 500 MG capsule Take 1 capsule (500 mg total) by mouth 3 (three) times daily. X 20 days 12/27/16  Yes Gladstone Lighter, MD  clopidogrel (PLAVIX) 75 MG tablet Take 1 tablet (75 mg total) by mouth daily. 12/28/16  Yes Gladstone Lighter, MD  feeding supplement, ENSURE ENLIVE, (ENSURE ENLIVE) LIQD Take 237 mLs by mouth 3 (three) times daily between meals. 12/03/16  Yes Vaughan Basta, MD  finasteride (PROSCAR) 5 MG tablet Take 1 tablet (5 mg total) by mouth daily. 12/04/16  Yes Vaughan Basta, MD  loperamide (IMODIUM) 2 MG capsule Take 1 capsule (2 mg total) by mouth every 6 (six) hours as needed for diarrhea or loose stools. 12/03/16  Yes Vaughan Basta, MD  metoprolol tartrate (LOPRESSOR) 25 MG tablet Take 0.5 tablets (12.5 mg total) by mouth 2 (two) times daily. 12/03/16  Yes Vaughan Basta, MD  ondansetron (ZOFRAN) 4 MG tablet Take 4 mg by mouth every 8 (eight) hours as needed for nausea. 10/28/16  Yes [provider]  Saccharomyces boulardii (PROBIOTIC) 250 MG CAPS Take 1 capsule by mouth daily. X 3 weeks 12/27/16  Yes Gladstone Lighter, MD  tamsulosin (FLOMAX) 0.4 MG CAPS capsule Take 1 capsule (0.4 mg total) by mouth daily. 12/04/16  Yes Vaughan Basta, MD  traMADol (ULTRAM) 50 MG tablet Take 50 mg by mouth 3 (  three) times daily as needed for moderate pain or severe pain.   Yes [provider]  vancomycin (VANCOCIN) 125 MG capsule Take 1 capsule (125 mg total) by mouth 4 (four) times daily. X 4 weeks 12/27/16  Yes Gladstone Lighter, MD  HYDROcodone-acetaminophen (NORCO/VICODIN) 5-325 MG tablet Take 1 tablet by mouth every 4 (four) hours as needed for moderate pain. Patient not taking: Reported on 01/21/2017 12/27/16    Gladstone Lighter, MD  lidocaine (LIDODERM) 5 % Place 1 patch onto the skin daily. Remove & Discard patch within 12 hours or as directed by MD Patient not taking: Reported on 01/07/2017 12/28/16   Gladstone Lighter, MD  lidocaine (XYLOCAINE) 5 % ointment Apply 1 application topically as needed. Apply to penile area Patient not taking: Reported on 01/19/2017 12/27/16   Gladstone Lighter, MD  opium-belladonna (B&O SUPPRETTES) 16.2-60 MG suppository Place 1 suppository rectally 2 (two) times daily as needed for bladder spasms. Patient not taking: Reported on 01/16/2017 12/03/16   Vaughan Basta, MD   No Known Allergies  FAMILY HISTORY:  family history includes Cancer in his father; Diabetes in his mother. SOCIAL HISTORY:  reports that he has quit smoking. He has quit using smokeless tobacco. He reports that he does not drink alcohol or use drugs.  REVIEW OF SYSTEMS:   Unable to assess pt confused   SUBJECTIVE:  Unable to assess pt confused   VITAL SIGNS: Temp:  [97.9 F (36.6 C)] 97.9 F (36.6 C) (08/22 1126) Pulse Rate:  [36-121] 112 (08/22 1242) Resp:  [16-28] 16 (08/22 1242) BP: (81-119)/(44-86) 119/56 (08/22 1242) SpO2:  [89 %-100 %] 100 % (08/22 1242) Weight:  [104.3 kg (230 lb)] 104.3 kg (230 lb) (08/22 0945)  PHYSICAL EXAMINATION: General: chronically ill appearing Caucasian male, NAD  Neuro: lethargic, withdraws from painful stimulation not following commands, PERRL HEENT: supple, no JVD Cardiovascular: sinus tach, no M/R/G Lungs: diminished throughout, even, non labored; no wheezes, rales, or rhonchi  Abdomen: +BS x4, soft, obese, non distended Musculoskeletal: normal bulk, no edema  Skin: left upper back abscess penrose drain in place ecchymosis surrounding site   Recent Labs Lab 12/22/2016 0948  NA 147*  K 4.4  CL 106  CO2 23  BUN 108*  CREATININE 5.26*  GLUCOSE 106*    Recent Labs Lab 01/06/2017 0948  HGB 17.9  HCT 53.7*  WBC 50.2*  PLT 386   Dg  Chest Port 1 View  Result Date: 01/20/2017 CLINICAL DATA:  76 year old male with increasing lethargy, now minimally responsive. Draining wound to left shoulder, status post incision and drainage of subcutaneous abscess of the upper back on 12/21/2016. Fever. EXAM: PORTABLE CHEST 1 VIEW COMPARISON:  Chest radiographs 12/18/2016 and earlier. FINDINGS: Portable AP supine view at 0958 hours. Percutaneous drain projects over the left axilla and scapula. No subcutaneous gas identified. Stable lung volumes. Allowing for portable technique the lungs are clear. Mediastinal contours remain within normal limits. Visualized tracheal air column is within normal limits. No acute osseous abnormality identified. IMPRESSION: 1.  No acute cardiopulmonary abnormality. 2. Left shoulder region percutaneous drain. No acute osseous abnormality identified. Electronically Signed   By: Genevie Ann M.D.   On: 12/29/2016 10:19    ASSESSMENT / PLAN: Septic shock with hypotension secondary to left upper back subcutaneous abscess and UTI  Acute Renal Failure  Lactic Acidosis  Leukocytosis  Hx: Recent dx Cdiff 07/28, CLL, and Cerebral Infarction P: Supplemental O2 for dyspnea and to maintain O2 sats greater than 92% Maintain map  greater than 65 NS @50  ml/hr  Trend WBC and monitor fever curve Trend lactic acid and PCT Follow cultures Will discontinue iv zosyn and continue iv vancomycin Will start oral vancomycin ID, surgery, nephrology, and palliative care consulted appreciate input  Trend BMP Replace electrolytes as indicated Monitor urinary output Daily weights Continuous telemetry monitoring   Marda Stalker, [place] Pager [phone] (please enter 7 digits) PCCM Consult Pager [phone] (please enter 7 digits)   STAFF NOTE: I. Dr. Ashby Dawes, have personally reviewed the patient's available data including medical history , events of notes, physical examination and test results as part of my  evaluation. I have discussed with the  Care with the NP and other care providers including  pharmacist, ICU RN, RRT, dietary.  Physical Exam Lungs - Decreased air entry bilaterally.  I personally reviewed images, CXR shows mild interstitial edema c/w volume overload.  Pt presents with sepsis with ARF, known osteomylelitis. Continue broad spectrum IV abx. Continue IVF, monitor cultures and adjust abx accordingly.   Marda Stalker, MD.   Board Certified in Internal Medicine, Pulmonary Medicine, [place], and Sleep Medicine.  [facility] Office Number: [phone] Pager: [phone]  Patricia Pesa, M.D.  Merton Border, M.D

## 2017-01-12 NOTE — Progress Notes (Signed)
   12/26/2016 1640  Vitals  BP 100/64  MAP (mmHg) 76  Pulse Rate 98  ECG Heart Rate 97  Resp 19  Oxygen Therapy  SpO2 100 %  End [place] CO2 (EtCO2) 18     Neo @ 53mcg

## 2017-01-13 DIAGNOSIS — Z7189 Other specified counseling: Secondary | ICD-10-CM

## 2017-01-13 DIAGNOSIS — E872 Acidosis, unspecified: Secondary | ICD-10-CM

## 2017-01-13 DIAGNOSIS — L02212 Cutaneous abscess of back [any part, except buttock]: Secondary | ICD-10-CM

## 2017-01-13 DIAGNOSIS — Z515 Encounter for palliative care: Secondary | ICD-10-CM

## 2017-01-13 DIAGNOSIS — N390 Urinary tract infection, site not specified: Secondary | ICD-10-CM

## 2017-01-13 DIAGNOSIS — R6521 Severe sepsis with septic shock: Secondary | ICD-10-CM

## 2017-01-13 LAB — BASIC METABOLIC PANEL
ANION GAP: 12 (ref 5–15)
BUN: 114 mg/dL — AB (ref 6–20)
CHLORIDE: 116 mmol/L — AB (ref 101–111)
CO2: 23 mmol/L (ref 22–32)
Calcium: 9.9 mg/dL (ref 8.9–10.3)
Creatinine, Ser: 4.17 mg/dL — ABNORMAL HIGH (ref 0.61–1.24)
GFR calc Af Amer: 15 mL/min — ABNORMAL LOW (ref >60)
GFR, EST NON AFRICAN AMERICAN: 13 mL/min — AB (ref >60)
GLUCOSE: 96 mg/dL (ref 65–99)
POTASSIUM: 3.8 mmol/L (ref 3.5–5.1)
Sodium: 151 mmol/L — ABNORMAL HIGH (ref 135–145)

## 2017-01-13 LAB — CBC
HEMATOCRIT: 41.8 % (ref 40.0–52.0)
HEMOGLOBIN: 13.9 g/dL (ref 13.0–18.0)
MCH: 30.1 pg (ref 26.0–34.0)
MCHC: 33.4 g/dL (ref 32.0–36.0)
MCV: 90.3 fL (ref 80.0–100.0)
Platelets: 266 10*3/uL (ref 150–440)
RBC: 4.63 MIL/uL (ref 4.40–5.90)
RDW: 14.3 % (ref 11.5–14.5)
WBC: 40.8 10*3/uL — ABNORMAL HIGH (ref 3.8–10.6)

## 2017-01-13 LAB — GLUCOSE, CAPILLARY
GLUCOSE-CAPILLARY: 97 mg/dL (ref 65–99)
Glucose-Capillary: 103 mg/dL — ABNORMAL HIGH (ref 65–99)
Glucose-Capillary: 80 mg/dL (ref 65–99)
Glucose-Capillary: 92 mg/dL (ref 65–99)
Glucose-Capillary: 92 mg/dL (ref 65–99)
Glucose-Capillary: 92 mg/dL (ref 65–99)

## 2017-01-13 MED ORDER — SODIUM CHLORIDE 0.9 % IV SOLN
0.0000 ug/min | INTRAVENOUS | Status: DC
  Administered 2017-01-13: 40 ug/min via INTRAVENOUS
  Administered 2017-01-13: 90 ug/min via INTRAVENOUS
  Filled 2017-01-13 (×2): qty 4

## 2017-01-13 MED ORDER — SODIUM CHLORIDE 0.45 % IV SOLN
INTRAVENOUS | Status: DC
  Administered 2017-01-13: 14:00:00 via INTRAVENOUS

## 2017-01-13 MED ORDER — DEXTROSE 5 % IV SOLN
INTRAVENOUS | Status: DC
  Administered 2017-01-13: 10:00:00 via INTRAVENOUS

## 2017-01-13 MED ORDER — MORPHINE SULFATE (PF) 2 MG/ML IV SOLN
1.0000 mg | INTRAVENOUS | Status: DC | PRN
  Administered 2017-01-14: 2 mg via INTRAVENOUS
  Filled 2017-01-13: qty 1

## 2017-01-13 NOTE — Care Management (Addendum)
RNCM notified by Corene Cornea with [facility] that they are providing services for RN and social work. Per previous RNCM note/previous admission home palliative services had been arranged. Per [facility] patient's wife would not let home health social worker visit patient. There is an active [facility] report per Corene Cornea and patient needs a higher level of care per home health nurse.  CSW this admission updated of this information from home health. This RNCM called [facility] [phone] to report patient admit- they have no record of this. They now have started a report based on my call. I advised [facility] to contact [facility] [phone] to hear this information from [facility] and [place] agreed.  Per Corene Cornea with [facility]- [place] left voice mail on [facility] number on 01/07/17. Need PT to evaluate patient when appropriate. I spoke with [place] with [place] team and patient is set up for in-home palliative however they have not been able to start care because of readmission.

## 2017-01-13 NOTE — Progress Notes (Signed)
[name]    MR#:  [id number]  DATE OF BIRTH:  May 03, 1941  SUBJECTIVE:  Patient is confused appears lethargic Wife in the room  REVIEW OF SYSTEMS:   Review of Systems  Unable to perform ROS: Medical condition   Tolerating Diet: Tolerating PT:   DRUG ALLERGIES:  No Known Allergies  VITALS:  Blood pressure (!) 88/53, pulse (!) 106, temperature 98.6 F (37 C), temperature source Axillary, resp. rate (!) 23, height 6' (1.829 m), weight 94.1 kg (207 lb 6.4 oz), SpO2 97 %.  PHYSICAL EXAMINATION:   Physical Exam  GENERAL:  76 y.o.-year-old patient lying in the bed with no acute distress. Chronically ill EYES: Pupils equal, round, reactive to light and accommodation. No scleral icterus. Extraocular muscles intact.  HEENT: Head atraumatic, normocephalic. Oropharynx and nasopharynx clear.  NECK:  Supple, no jugular venous distention. No thyroid enlargement, no tenderness.  LUNGS: Normal breath sounds bilaterally, no wheezing, rales, rhonchi. No use of accessory muscles of respiration.  CARDIOVASCULAR: S1, S2 normal. No murmurs, rubs, or gallops.  ABDOMEN: Soft, nontender, nondistended. Bowel sounds present. No organomegaly or mass. Chronic Foley draining cloudy urine EXTREMITIES: No cyanosis, clubbing or edema b/l.    NEUROLOGIC: Unable to assess given patient's with a [place]  PSYCHIATRIC: Patient is lethargic although arousable SKIN: No obvious rash, lesion, or ulcer.   LABORATORY PANEL:  CBC  Recent Labs Lab 01/13/17 0441  WBC 40.8*  HGB 13.9  HCT 41.8  PLT 266    Chemistries   Recent Labs Lab 01/10/2017 0948 01/13/17 0441  NA 147* 151*  K 4.4 3.8  CL 106 116*  CO2 23 23  GLUCOSE 106* 96  BUN 108* 114*  CREATININE 5.26* 4.17*  CALCIUM 11.6* 9.9  AST 39  --   ALT 35  --   ALKPHOS 106  --   BILITOT 0.8  --    Cardiac Enzymes  Recent Labs Lab 12/23/2016 0948  TROPONINI <0.03    RADIOLOGY:  Dg Chest Port 1 View  Result Date: 01/21/2017 CLINICAL DATA:  76 year old male with increasing lethargy, now minimally responsive. Draining wound to left shoulder, status post incision and drainage of subcutaneous abscess of the upper back on 12/21/2016. Fever. EXAM: PORTABLE CHEST 1 VIEW COMPARISON:  Chest radiographs 12/18/2016 and earlier. FINDINGS: Portable AP supine view at 0958 hours. Percutaneous drain projects over the left axilla and scapula. No subcutaneous gas identified. Stable lung volumes. Allowing for portable technique the lungs are clear. Mediastinal contours remain within normal limits. Visualized tracheal air column is within normal limits. No acute osseous abnormality identified. IMPRESSION: 1.  No acute cardiopulmonary abnormality. 2. Left shoulder region percutaneous drain. No acute osseous abnormality identified. Electronically Signed   By: Genevie Ann M.D.   On: 12/28/2016 10:19   ASSESSMENT AND PLAN:   William Kennedy  is a 76 y.o. male with a known history of Previous cerebral infarct, abscess of left shoulder with Penrose drains and on oral antibiotics brought in by wife because of altered mental status, patient was very confused since yesterday. Wife noticed that significant increase in drainage from left shoulder abscess since yesterday night. She is unable to get him to go to doctor's appointment and she noticed that he is very confused and also taking out his Foley catheter. Patient found to have urinary retention up to 1 L on bladder scan in the emergency room  #Acute encephalopathy/altered mental status secondary  to sepsi -Source appears to be urine given significant urinary retention and cloudy urine along with possible left upper back abscess -IV Zosyn and vancomycin -Appreciate infectious disease consultation -Follow blood cultures  2. Leukocytosis -Patient has history of CLL is normal white count is around 25,000 -Came in with white count of 50,000---  down to 40,000  3. History of BPH with urinary retention and chronic Foley catheter that was placed about a month ago with urinary retention secondary to Foley clogging and now with UTI -Patient's Foley changed -Diuresing well  4. Acute renal failure secondary to ATN due to sepsis and post obstructive uropathy -Patient had urinary retention and a 1 L drained in the ER Foley changed now improving -Continue to monitor creatinine -Appreciate nephrology input -Patient seen by Dr. Candiss Norse -Baseline creatinine 1.08 -Came in with creatinine of 5--- 4.17  5. Acute hypernatremia hyperchloremia secondary to significant dehydration with poor by mouth intake -Continue IV fluids  6. Left back abscess with recent drain 2 -Seen by surgery Dr. Tamala Julian--- does not feel abscess is the source of sepsis -Continue to monitor cultures  Above was discussed with patient's wife in the room  Case discussed with Care Management/Social Worker. Management plans discussed with the patient, family and they are in agreement.  CODE STATUS: DO NOT RESUSCITATE  DVT Prophylaxis: Heparin**  TOTAL TIME TAKING CARE OF THIS PATIENT: **40 minutes.  >50% time spent on counselling and coordination of care  POSSIBLE D/C IN few DAYS, DEPENDING ON CLINICAL CONDITION.  Note: This dictation was prepared with Dragon dictation along with smaller phrase technology. Any transcriptional errors that result from this process are unintentional.  William Kennedy M.D on 01/13/2017 at 1:56 PM  Between 7am to 6pm - Pager - [phone]  After 6pm go to [email or web address] - password [email or web address] [facility]  Office  [phone]  CC: Primary care physician; [name], MD

## 2017-01-13 NOTE — Progress Notes (Signed)
16 beat run of V-Tach.  [place], NP informed.  No new orders.  Will continue to monitor.

## 2017-01-13 NOTE — Consult Note (Signed)
Consultation Note Date: 01/13/2017   Patient Name: William Kennedy  DOB: [birth year]  MRN: [id number]  Age / Sex: 76 y.o., male  PCP: [name], MD Referring Physician: Fritzi Mandes, MD  Reason for Consultation: Establishing goals of care  HPI/Patient Profile: 76 y.o. male  with past medical history of CLL, cerebral infarct, BPH, and left upper back abscess admitted on 01/14/2017 with lethargy from home. Patient with history of left upper back abscess since approximately March with positive cultures for methicillin sensitive staphylococcus aureus. Followed by Dr. Tamala Julian with general surgery. I&D of wound on 7/30 and has been receiving antibiotics. Recent hospitalization for sepsis secondary to UTI and abscess. Hospital stay complicated by acute stroke on 12/23/16. Also positive for cdiff and receiving vancomycin. Patient/wife declined SNF for rehab and returned home. In ED, patient with severe sepsis and acute kidney injury. Positive for UTI. Phenylephrine initiated. Infectious disease, nephrology, and surgery following. Palliative medicine consultation for goals of care.   Clinical Assessment and Goals of Care: I have reviewed medical records, discussed with care team, and met with wife (Scottie) at bedside to discuss diagnosis prognosis, GOC, EOL wishes, disposition and options. Patient is lethargic. Occasionally will open eyes and moan but not following commands and not able to participate in conversation.   Introduced Palliative Medicine and support we will provide during hospitalization.   We discussed a brief life review of the patient. Married to wife Oceanographer) for 41 years. Three adult daughters, two who live out of state and youngest who lives with Scottie and has mild autism. 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 Wife shares his great joy  of working and socializing with work friends. Also enjoys "being creative" and sports. Scottie recaps his declining health since abscess including UTI, kidney stones, cdiff infection, and stroke. Since his discharge last hospitalization, he has not been ambulatory and with poor appetite. He refused to go to rehab for physical therapy. "Do not say the word rehab. It makes him very anxious."   Discussed hospital diagnoses, interventions, and guarded prognosis with septic shock and requiring medicine to maintain blood pressure.   Advanced directives, concepts specific to code status, and artifical feeding and hydration were discussed. Scottie confirms her husband has spoke of his wishes for no resuscitation or life support. Because he has been such an independent, working man, she does not believe he would want his life prolonged by heroic means (including feeding tube) if he was not showing improvement or became bed bound long-term. She states "this is the worst he has been."   There are times throughout the conversation where Scottie seems realistic of guarded prognosis. "I do not want to leave his side. Anything could happen" and "we didn't think he would make it through the night." but also seems hopeful that he will show improvement. "I have some hope that we will have another 42 years together" and "he wants to get back to work." [employer]  also is very concerned about long term disability paperwork. "If this isn't done, we don't eat."   I asked Scottie what her main goal for him is. She tells me she wants to take him home. He has spoken of his wishes of wanting to die at home. Scottie wants to continue medications (pressor, antibiotics) and hopeful he will start to show improvement in mental status.  I encouraged her to consider her wishes if he doesn't show improvement. We briefly discussed home with hospice services if he survives hospitalization. Scottie was open to this as an option.   Scottie  shares many stories and pictures of her husband and his best friend, Saralyn Pilar, their [place]. Therapeutic listening and emotional/spiritual support provided.    SUMMARY OF RECOMMENDATIONS    DNR/DNI  Continue all other interventions including pressors/antibiotics.   Watchful waiting. Wife hopeful for improvement.   Patient has spoken of his wishes to die at home. Wife open to hospice services on discharge if he does not show improvement and/or survives hospitalization.   PMT will f/u tomorrow, 8/24 to further discuss goals with wife.   Code Status/Advance Care Planning:  DNR  Symptom Management:   Per attending  Palliative Prophylaxis:   Aspiration, Delirium Protocol, Frequent Pain Assessment, Oral Care and Turn Reposition  Additional Recommendations (Limitations, Scope, Preferences):  DNR/DNI, continue pressors, ABX, fluids.  Psycho-social/Spiritual:   Desire for further Chaplaincy support: yes  Additional Recommendations: Caregiving  Support/Resources and Education on Hospice  Prognosis:   Unable to determine: guarded with septic shock secondary to UTI and subcutaneous back abscess, acute kidney injury, and acute encephalopathy. On phenylephrine to maintain blood pressure.   Discharge Planning: To Be Determined      Primary Diagnoses: Present on Admission: . Sepsis associated hypotension ([place])   I have reviewed the medical record, interviewed the patient and family, and examined the patient. The following aspects are pertinent.  Past Medical History:  Diagnosis Date  . Abscess    shoulder  . Leukemia [facility])    Social History   Social History  . Marital status: Married    Spouse name: N/A  . Number of children: N/A  . Years of education: N/A   Social History Main Topics  . Smoking status: Former Research scientist (life sciences)  . Smokeless tobacco: Former Systems developer  . Alcohol use No  . Drug use: No  . Sexual activity: Yes    Partners: Female   Other Topics Concern  .  None   Social History Narrative  . None   Family History  Problem Relation Age of Onset  . Diabetes Mother   . Cancer Father    Scheduled Meds: . clopidogrel  75 mg Oral Daily  . docusate sodium  100 mg Oral BID  . feeding supplement (ENSURE ENLIVE)  237 mL Oral TID BM  . finasteride  5 mg Oral Daily  . heparin  5,000 Units Subcutaneous Q8H  . sodium chloride flush  10-40 mL Intracatheter Q12H  . tamsulosin  0.4 mg Oral Daily  . vancomycin  500 mg Oral QID   Continuous Infusions: . sodium chloride    . phenylephrine (NEO-SYNEPHRINE) Adult infusion 40 mcg/min (01/13/17 1320)  . piperacillin-tazobactam (ZOSYN)  IV Stopped (01/13/17 0932)   PRN Meds:.acetaminophen **OR** acetaminophen, morphine injection, [DISCONTINUED] ondansetron **OR** ondansetron (ZOFRAN) IV, sodium chloride flush Medications Prior to Admission:  Prior to Admission medications   Medication Sig Start Date End Date Taking? Authorizing Provider  acetaminophen (TYLENOL) 325 MG tablet Take 650  mg by mouth every 4 (four) hours as needed for pain.   Yes [provider]  aspirin 81 MG chewable tablet Chew 1 tablet (81 mg total) by mouth daily. 12/28/16  Yes Gladstone Lighter, MD  atorvastatin (LIPITOR) 40 MG tablet Take 1 tablet (40 mg total) by mouth daily at 6 PM. 12/27/16  Yes Gladstone Lighter, MD  cephALEXin (KEFLEX) 500 MG capsule Take 1 capsule (500 mg total) by mouth 3 (three) times daily. X 20 days 12/27/16  Yes Gladstone Lighter, MD  clopidogrel (PLAVIX) 75 MG tablet Take 1 tablet (75 mg total) by mouth daily. 12/28/16  Yes Gladstone Lighter, MD  feeding supplement, ENSURE ENLIVE, (ENSURE ENLIVE) LIQD Take 237 mLs by mouth 3 (three) times daily between meals. 12/03/16  Yes Vaughan Basta, MD  finasteride (PROSCAR) 5 MG tablet Take 1 tablet (5 mg total) by mouth daily. 12/04/16  Yes Vaughan Basta, MD  loperamide (IMODIUM) 2 MG capsule Take 1 capsule (2 mg total) by mouth every 6 (six) hours  as needed for diarrhea or loose stools. 12/03/16  Yes Vaughan Basta, MD  metoprolol tartrate (LOPRESSOR) 25 MG tablet Take 0.5 tablets (12.5 mg total) by mouth 2 (two) times daily. 12/03/16  Yes Vaughan Basta, MD  ondansetron (ZOFRAN) 4 MG tablet Take 4 mg by mouth every 8 (eight) hours as needed for nausea. 10/28/16  Yes [provider]  Saccharomyces boulardii (PROBIOTIC) 250 MG CAPS Take 1 capsule by mouth daily. X 3 weeks 12/27/16  Yes Gladstone Lighter, MD  tamsulosin (FLOMAX) 0.4 MG CAPS capsule Take 1 capsule (0.4 mg total) by mouth daily. 12/04/16  Yes Vaughan Basta, MD  traMADol (ULTRAM) 50 MG tablet Take 50 mg by mouth 3 (three) times daily as needed for moderate pain or severe pain.   Yes [provider]  vancomycin (VANCOCIN) 125 MG capsule Take 1 capsule (125 mg total) by mouth 4 (four) times daily. X 4 weeks 12/27/16  Yes Gladstone Lighter, MD  HYDROcodone-acetaminophen (NORCO/VICODIN) 5-325 MG tablet Take 1 tablet by mouth every 4 (four) hours as needed for moderate pain. Patient not taking: Reported on 01/06/2017 12/27/16   Gladstone Lighter, MD  lidocaine (LIDODERM) 5 % Place 1 patch onto the skin daily. Remove & Discard patch within 12 hours or as directed by MD Patient not taking: Reported on 01/04/2017 12/28/16   Gladstone Lighter, MD  lidocaine (XYLOCAINE) 5 % ointment Apply 1 application topically as needed. Apply to penile area Patient not taking: Reported on 01/03/2017 12/27/16   Gladstone Lighter, MD  opium-belladonna (B&O SUPPRETTES) 16.2-60 MG suppository Place 1 suppository rectally 2 (two) times daily as needed for bladder spasms. Patient not taking: Reported on 12/25/2016 12/03/16   Vaughan Basta, MD   No Known Allergies Review of Systems  Unable to perform ROS: Acuity of condition   Physical Exam  Constitutional: He appears lethargic. He appears ill.  HENT:  Head: Normocephalic and atraumatic.  Cardiovascular: Tachycardia  present.   Pulmonary/Chest: No accessory muscle usage. No tachypnea. No respiratory distress. He has decreased breath sounds.  Abdominal: Normal appearance.  Neurological: He appears lethargic.  Skin: Skin is warm and dry.  Psychiatric: Cognition and memory are impaired. He is inattentive.  Nursing note and vitals reviewed.  Vital Signs: BP (!) 88/53   Pulse (!) 106   Temp 98.6 F (37 C) (Axillary)   Resp (!) 23   Ht 6' (1.829 m)   Wt 94.1 kg (207 lb 6.4 oz)   SpO2 97%  BMI 28.13 kg/m  Pain Assessment: CPOT   Pain Score: Asleep  SpO2: SpO2: 97 % O2 Device:SpO2: 97 % O2 Flow Rate: .O2 Flow Rate (L/min): 2 L/min  IO: Intake/output summary:   Intake/Output Summary (Last 24 hours) at 01/13/17 1409 Last data filed at 01/13/17 1300  Gross per 24 hour  Intake          1764.82 ml  Output                0 ml  Net          1764.82 ml    LBM: Last BM Date: 01/09/2017 Baseline Weight: Weight: 104.3 kg (230 lb) Most recent weight: Weight: 94.1 kg (207 lb 6.4 oz)     Palliative Assessment/Data: PPS 20%   Flowsheet Rows     Most Recent Value  Intake Tab  Referral Department  Hospitalist  Unit at Time of Referral  ICU  Palliative Care Primary Diagnosis  Sepsis/Infectious Disease  Palliative Care Type  Return patient Palliative Care  Reason for referral  Clarify Goals of Care  Date first seen by Palliative Care  01/13/17  Clinical Assessment  Palliative Performance Scale Score  20%  Psychosocial & Spiritual Assessment  Palliative Care Outcomes  Patient/Family meeting held?  Yes  Who was at the meeting?  wife  Palliative Care Outcomes  Clarified goals of care, Provided end of life care assistance, Provided psychosocial or spiritual support, ACP counseling assistance, Counseled regarding hospice      Time In: 1240 Time Out: 1410 Time Total: 44mn Greater than 50%  of this time was spent counseling and coordinating care related to the above assessment and plan.  Signed  by:  MIhor Dow FNP-C Palliative Medicine Team  Phone: [phone]: [phone]  Please contact Palliative Medicine Team phone at [phone] questions and concerns.  For individual provider: See AShea Evans

## 2017-01-13 NOTE — Consult Note (Signed)
Date: 01/13/2017                  Patient Name:  William Kennedy  MRN: [id number]  DOB: 04-Oct-1940  Age / Sex: 76 y.o., male         PCP: [name], MD                 Service Requesting Consult: Hospitalist/ Fritzi Mandes, MD                 Reason for Consult: ARF            History of Present Illness: Patient is a 76 y.o. male with medical problems of Abscess of the left shoulder, history of stroke, CLL, BPH with significantly enlarged prostate gland, sigmoid diverticulitis, calcified right adrenal gland cyst, left ureteral calculus, history of bladder outlet obstruction in July 2018, who was admitted to [facility] on 01/10/2017 for evaluation of confusion, altered mental status.   Upon arrival in the ER, patient was found to have urinary retention due to blockage in the Foley catheter. Foley was changed. Resulted in increased urine output. Other problems include worsening of abscess on his back/left shoulder. Patient had ESWL in June followed by left ureteroscopy on 11/06/2016.   Patient's baseline creatinine is 0.78 from August 4. Admission creatinine on August 22 was 5.26. Calcium was high at 11.6 Patient's serum creatinine has improved to 4.17 today. After Foley intervention, urine output has also increased. However, patient's sodium level has worsened to 151.  Patient continues to have altered mental status. He is not able to provide any meaningful information.   Medications: Outpatient medications: Prescriptions Prior to Admission  Medication Sig Dispense Refill Last Dose  . acetaminophen (TYLENOL) 325 MG tablet Take 650 mg by mouth every 4 (four) hours as needed for pain.   prn at prn  . aspirin 81 MG chewable tablet Chew 1 tablet (81 mg total) by mouth daily. 30 tablet 2 01/11/2017 at 0800  . atorvastatin (LIPITOR) 40 MG tablet Take 1 tablet (40 mg total) by mouth daily at 6 PM. 30 tablet 2 01/11/2017 at 1800  . cephALEXin (KEFLEX) 500 MG capsule Take 1 capsule (500 mg  total) by mouth 3 (three) times daily. X 20 days 60 capsule 0 01/11/2017 at 2000  . clopidogrel (PLAVIX) 75 MG tablet Take 1 tablet (75 mg total) by mouth daily. 30 tablet 2 01/11/2017 at 0800  . feeding supplement, ENSURE ENLIVE, (ENSURE ENLIVE) LIQD Take 237 mLs by mouth 3 (three) times daily between meals. 237 mL 12   . finasteride (PROSCAR) 5 MG tablet Take 1 tablet (5 mg total) by mouth daily. 30 tablet 0 01/11/2017 at 0800  . loperamide (IMODIUM) 2 MG capsule Take 1 capsule (2 mg total) by mouth every 6 (six) hours as needed for diarrhea or loose stools. 30 capsule 0 01/11/2017 at 2000  . metoprolol tartrate (LOPRESSOR) 25 MG tablet Take 0.5 tablets (12.5 mg total) by mouth 2 (two) times daily. 30 tablet 0 01/11/2017 at 2000  . ondansetron (ZOFRAN) 4 MG tablet Take 4 mg by mouth every 8 (eight) hours as needed for nausea.   prn at prn  . Saccharomyces boulardii (PROBIOTIC) 250 MG CAPS Take 1 capsule by mouth daily. X 3 weeks 21 capsule 0 01/11/2017 at 0800  . tamsulosin (FLOMAX) 0.4 MG CAPS capsule Take 1 capsule (0.4 mg total) by mouth daily. 30 capsule 0 01/11/2017 at 0800  . traMADol (ULTRAM) 50 MG  tablet Take 50 mg by mouth 3 (three) times daily as needed for moderate pain or severe pain.   01/11/2017 at 2000  . vancomycin (VANCOCIN) 125 MG capsule Take 1 capsule (125 mg total) by mouth 4 (four) times daily. X 4 weeks 120 capsule 0 01/11/2017 at 2000  . HYDROcodone-acetaminophen (NORCO/VICODIN) 5-325 MG tablet Take 1 tablet by mouth every 4 (four) hours as needed for moderate pain. (Patient not taking: Reported on 01/04/2017) 20 tablet 0 Not Taking at Unknown time  . lidocaine (LIDODERM) 5 % Place 1 patch onto the skin daily. Remove & Discard patch within 12 hours or as directed by MD (Patient not taking: Reported on 12/30/2016) 30 patch 0 Not Taking at Unknown time  . lidocaine (XYLOCAINE) 5 % ointment Apply 1 application topically as needed. Apply to penile area (Patient not taking: Reported on  01/07/2017) 30 g 0 Not Taking at Unknown time  . opium-belladonna (B&O SUPPRETTES) 16.2-60 MG suppository Place 1 suppository rectally 2 (two) times daily as needed for bladder spasms. (Patient not taking: Reported on 01/08/2017) 20 suppository 0 Not Taking at Unknown time    Current medications: Current Facility-Administered Medications  Medication Dose Route Frequency Provider Last Rate Last Dose  . acetaminophen (TYLENOL) tablet 650 mg  650 mg Oral Q6H PRN Epifanio Lesches, MD       Or  . acetaminophen (TYLENOL) suppository 650 mg  650 mg Rectal Q6H PRN Epifanio Lesches, MD      . clopidogrel (PLAVIX) tablet 75 mg  75 mg Oral Daily Epifanio Lesches, MD      . dextrose 5 % solution   Intravenous Continuous Wilhelmina Mcardle, MD 50 mL/hr at 01/13/17 [id number]    . docusate sodium (COLACE) capsule 100 mg  100 mg Oral BID Epifanio Lesches, MD      . feeding supplement (ENSURE ENLIVE) (ENSURE ENLIVE) liquid 237 mL  237 mL Oral TID BM Epifanio Lesches, MD      . finasteride (PROSCAR) tablet 5 mg  5 mg Oral Daily Epifanio Lesches, MD      . heparin injection 5,000 Units  5,000 Units Subcutaneous Q8H Epifanio Lesches, MD   5,000 Units at 01/13/17 0535  . morphine 2 MG/ML injection 1-2 mg  1-2 mg Intravenous Q4H PRN Wilhelmina Mcardle, MD      . ondansetron [facility]) injection 4 mg  4 mg Intravenous Q6H PRN Epifanio Lesches, MD      . phenylephrine (NEO-SYNEPHRINE) 40 mg in sodium chloride 0.9 % 250 mL (0.16 mg/mL) infusion  0-200 mcg/min Intravenous Titrated Wilhelmina Mcardle, MD 18.8 mL/hr at 01/13/17 0946 50 mcg/min at 01/13/17 0946  . piperacillin-tazobactam (ZOSYN) IVPB 3.375 g  3.375 g Intravenous Q12H Napoleon Form, [facility]   Stopped at 01/13/17 0932  . sodium chloride flush (NS) 0.9 % injection 10-40 mL  10-40 mL Intracatheter Q12H Epifanio Lesches, MD   10 mL at 01/13/17 0948  . sodium chloride flush (NS) 0.9 % injection 10-40 mL  10-40 mL Intracatheter PRN Epifanio Lesches, MD      . tamsulosin (FLOMAX) capsule 0.4 mg  0.4 mg Oral Daily Epifanio Lesches, MD      . vancomycin (VANCOCIN) 50 mg/mL oral solution 500 mg  500 mg Oral QID Awilda Bill, NP   500 mg at 12/30/2016 1708      Allergies: No Known Allergies    Past Medical History: Past Medical History:  Diagnosis Date  . Abscess    shoulder  .  Leukemia [facility])      Past Surgical History: Past Surgical History:  Procedure Laterality Date  . BACK SURGERY    . CYSTOSCOPY WITH URETEROSCOPY AND STENT PLACEMENT Left 11/06/2016   Procedure: CYSTOSCOPY WITH URETEROSCOPY AND STENT PLACEMENT retrograde pyelogram,holmium laser;  Surgeon: Irine Seal, MD;  Location: [facility] ORS;  Service: Urology;  Laterality: Left;  . EXTRACORPOREAL SHOCK WAVE LITHOTRIPSY Left 11/04/2016   Procedure: EXTRACORPOREAL SHOCK WAVE LITHOTRIPSY (ESWL);  Surgeon: Hollice Espy, MD;  Location: [facility] ORS;  Service: Urology;  Laterality: Left;     Family History: Family History  Problem Relation Age of Onset  . Diabetes Mother   . Cancer Father      Social History: Social History   Social History  . Marital status: Married    Spouse name: N/A  . Number of children: N/A  . Years of education: N/A   Occupational History  . Not on file.   Social History Main Topics  . Smoking status: Former Research scientist (life sciences)  . Smokeless tobacco: Former Systems developer  . Alcohol use No  . Drug use: No  . Sexual activity: Yes    Partners: Female   Other Topics Concern  . Not on file   Social History Narrative  . No narrative on file     Review of Systems:Not available due to altered mental status. Gen:  HEENT:  CV:  Resp:  GI: GU :  MS:  Derm:   Psych: Heme:  Neuro:  Endocrine  Vital Signs: Blood pressure (!) 99/59, pulse 99, temperature 98.6 F (37 C), temperature source Axillary, resp. rate 20, height 6' (1.829 m), weight 94.1 kg (207 lb 6.4 oz), SpO2 97 %.   Intake/Output Summary (Last 24 hours) at 01/13/17  1209 Last data filed at 01/13/17 1100  Gross per 24 hour  Intake          1631.02 ml  Output             1000 ml  Net           631.02 ml    Weight trends: Filed Weights   01/03/2017 0945 01/04/2017 1400  Weight: 104.3 kg (230 lb) 94.1 kg (207 lb 6.4 oz)    Physical Exam: General:  Chronically ill-appearing gentleman, laying in the bed   HEENT Anicteric, pupils are round, mouth is dry   Neck:  No JVD   Lungs: Normal breathing effort, clear to auscultation bilaterally   Heart::  Irregular, tachycardic   Abdomen: Soft, nontender, nondistended   Extremities:  SCDs in place, no peripheral edema   Neurologic: Obtunded   Skin: Decreased turgor      Foley: In place        Lab results: Basic Metabolic Panel:  Recent Labs Lab 01/20/2017 0948 01/13/17 0441  NA 147* 151*  K 4.4 3.8  CL 106 116*  CO2 23 23  GLUCOSE 106* 96  BUN 108* 114*  CREATININE 5.26* 4.17*  CALCIUM 11.6* 9.9    Liver Function Tests:  Recent Labs Lab 01/19/2017 0948  AST 39  ALT 35  ALKPHOS 106  BILITOT 0.8  PROT 8.1  ALBUMIN 2.8*   No results for input(s): LIPASE, AMYLASE in the last 168 hours. No results for input(s): AMMONIA in the last 168 hours.  CBC:  Recent Labs Lab 01/07/2017 0948 01/13/17 0441  WBC 50.2* 40.8*  NEUTROABS 31.1*  --   HGB 17.9 13.9  HCT 53.7* 41.8  MCV 90.0 90.3  PLT 386 266  Cardiac Enzymes:  Recent Labs Lab 01/05/2017 0948  TROPONINI <0.03    BNP: Invalid input(s): POCBNP  CBG:  Recent Labs Lab 12/24/2016 1641 01/05/2017 1928 01/13/17 0353 01/13/17 0724 01/13/17 1124  GLUCAP 98 89 80 92 103*    Microbiology: Recent Results (from the past 720 hour(s))  Culture, blood (Routine x 2)     Status: None   Collection Time: 12/18/16 12:35 PM  Result Value Ref Range Status   Specimen Description BLOOD RIGHT ANTECUBITAL  Final   Special Requests   Final    BOTTLES DRAWN AEROBIC AND ANAEROBIC Blood Culture adequate volume   Culture NO GROWTH 5 DAYS   Final   Report Status 12/23/2016 FINAL  Final  Culture, blood (Routine x 2)     Status: None   Collection Time: 12/18/16 12:35 PM  Result Value Ref Range Status   Specimen Description BLOOD BLOOD LEFT WRIST  Final   Special Requests   Final    BOTTLES DRAWN AEROBIC AND ANAEROBIC Blood Culture adequate volume   Culture NO GROWTH 5 DAYS  Final   Report Status 12/23/2016 FINAL  Final  Urine culture     Status: None   Collection Time: 12/18/16 12:35 PM  Result Value Ref Range Status   Specimen Description URINE, RANDOM  Final   Special Requests NONE  Final   Culture   Final    NO GROWTH Performed at [facility], [place] [address] [place], [place] [address]    Report Status 12/19/2016 FINAL  Final  C difficile quick scan w PCR reflex     Status: None   Collection Time: 12/18/16  6:02 PM  Result Value Ref Range Status   C Diff antigen NEGATIVE NEGATIVE Final   C Diff toxin NEGATIVE NEGATIVE Final   C Diff interpretation No C. difficile detected.  Final  MRSA PCR Screening     Status: None   Collection Time: 12/19/16 11:24 AM  Result Value Ref Range Status   MRSA by PCR NEGATIVE NEGATIVE Final    Comment:        The GeneXpert MRSA Assay (FDA approved for NASAL specimens only), is one component of a comprehensive MRSA colonization surveillance program. It is not intended to diagnose MRSA infection nor to guide or monitor treatment for MRSA infections.   Aerobic/Anaerobic Culture (surgical/deep wound)     Status: None   Collection Time: 12/21/16 12:28 PM  Result Value Ref Range Status   Specimen Description BACK  Final   Special Requests Immunocompromised  Final   Gram Stain   Final    ABUNDANT WBC PRESENT, PREDOMINANTLY PMN NO ORGANISMS SEEN    Culture   Final    No growth aerobically or anaerobically. Performed at [facility], [place] [address] [place], [place] [address]    Report Status 12/26/2016 FINAL  Final  Blood Culture (routine x 2)     Status: None  (Preliminary result)   Collection Time: 12/25/2016  9:48 AM  Result Value Ref Range Status   Specimen Description BLOOD RIGHT AC  Final   Special Requests   Final    BOTTLES DRAWN AEROBIC AND ANAEROBIC Blood Culture adequate volume   Culture NO GROWTH < 24 HOURS  Final   Report Status PENDING  Incomplete  Blood Culture (routine x 2)     Status: None (Preliminary result)   Collection Time: 01/11/2017  9:48 AM  Result Value Ref Range Status   Specimen Description BLOOD RIGHT  HAND  Final   Special Requests   Final    BOTTLES DRAWN AEROBIC AND ANAEROBIC Blood Culture results may not be optimal due to an inadequate volume of blood received in culture bottles   Culture NO GROWTH < 24 HOURS  Final   Report Status PENDING  Incomplete  Urine culture     Status: Abnormal (Preliminary result)   Collection Time: 12/28/2016  9:48 AM  Result Value Ref Range Status   Specimen Description URINE, RANDOM  Final   Special Requests NONE  Final   Culture (A)  Final    >=100,000 COLONIES/mL PSEUDOMONAS AERUGINOSA SUSCEPTIBILITIES TO FOLLOW Performed at [facility], [address] [place], [place] [address]    Report Status PENDING  Incomplete  Aerobic/Anaerobic Culture (surgical/deep wound)     Status: None (Preliminary result)   Collection Time: 01/15/2017  5:43 PM  Result Value Ref Range Status   Specimen Description SHOULDER  Final   Special Requests NONE  Final   Gram Stain   Final    MODERATE WBC PRESENT, PREDOMINANTLY PMN MODERATE GRAM POSITIVE COCCI IN PAIRS MODERATE GRAM NEGATIVE RODS Performed at [facility], [place] [address], [place], [place] [address]    Culture PENDING  Incomplete   Report Status PENDING  Incomplete     Coagulation Studies: No results for input(s): LABPROT, INR in the last 72 hours.  Urinalysis:  Recent Labs  12/26/2016 0948  COLORURINE YELLOW*  LABSPEC 1.017  PHURINE 5.0  GLUCOSEU NEGATIVE  HGBUR LARGE*  BILIRUBINUR NEGATIVE  KETONESUR NEGATIVE   PROTEINUR 100*  NITRITE NEGATIVE  LEUKOCYTESUR MODERATE*        Imaging: Dg Chest Port 1 View  Result Date: 12/22/2016 CLINICAL DATA:  76 year old male with increasing lethargy, now minimally responsive. Draining wound to left shoulder, status post incision and drainage of subcutaneous abscess of the upper back on 12/21/2016. Fever. EXAM: PORTABLE CHEST 1 VIEW COMPARISON:  Chest radiographs 12/18/2016 and earlier. FINDINGS: Portable AP supine view at 0958 hours. Percutaneous drain projects over the left axilla and scapula. No subcutaneous gas identified. Stable lung volumes. Allowing for portable technique the lungs are clear. Mediastinal contours remain within normal limits. Visualized tracheal air column is within normal limits. No acute osseous abnormality identified. IMPRESSION: 1.  No acute cardiopulmonary abnormality. 2. Left shoulder region percutaneous drain. No acute osseous abnormality identified. Electronically Signed   By: Genevie Ann M.D.   On: 12/26/2016 10:19      Assessment & Plan: Pt is a 76 y.o. caucasian male with with medical problems of Abscess of the left shoulder, history of stroke, CLL, BPH with significantly enlarged prostate gland, sigmoid diverticulitis, calcified right adrenal gland cyst, left ureteral calculus, history of bladder outlet obstruction in July 2018, history of C. difficile, who was admitted to [facility] on 01/16/2017 for evaluation of confusion, altered mental status.   1. Acute renal failure due to bladder outlet obstruction Urine output has improved after Foley catheter was changed Electrolytes and volume status are acceptable. No acute indication for dialysis at present  2. Urinary tract infection Broad-spectrum antibiotics  3. Hypernatremia Free water supplementation. Consider changing to half normal saline at a high rate for Free water replacement as well as volume replacement  4. Left shoulder abscess Broad-spectrum antibiotics Surgery team is  following  5. Altered mental status Likely secondary to multiple infections/sepsis

## 2017-01-13 NOTE — Progress Notes (Signed)
He is very lethargic today and remains hypotensive.  He is receiving a Neo-Synephrine drip and also Zosyn and vancomycin  The wound of the left upper back continues to drain and today it appears to be a thin purulent discharge.  The Penrose drains remain intact.  There is minimal degree of tenderness and no fluctuance found.  Urine culture demonstrated greater than 100,000 colonies of pseudomonas aeruginosa.  Culture of the back abscess with gram-positive cocci and gram-negative rods  Impression back abscess continues to drain, urinary tract infection, septic shock.  There appears to be no indication for additional incision and drainage.  I discussed with the nurse plans to continue frequent dressing changes.

## 2017-01-13 NOTE — Progress Notes (Signed)
Somnolent. Not following commands Moaning at times. Remains on vasopressors No overt respiratory distress  Vitals:   01/13/17 1015 01/13/17 1030 01/13/17 1045 01/13/17 1100  BP: (!) 77/54 (!) 95/40 (!) 98/52 (!) 99/59  Pulse: 99 (!) 108 (!) 107 99  Resp: (!) 27 (!) 26 (!) 26 20  Temp:      TempSrc:      SpO2: 97% 97% 96% 97%  Weight:      Height:        Chronically ill-appearing Moaning No respiratory distress HEENT: NCAT, sclerae white Neuro: pupils react, MAEs Chest: Slightly coarse breath sounds, no wheezes Cardiac: Tachycardia, regular with extrasystoles, no murmurs noted Abdomen: Soft, diminished bowel sounds, NT Extremities: Cool, no edema  BMP Latest Ref Rng & Units 01/13/2017 12/23/2016 12/25/2016  Glucose 65 - 99 mg/dL 96 106(H) 104(H)  BUN 6 - 20 mg/dL 114(H) 108(H) 7  Creatinine 0.61 - 1.24 mg/dL 4.17(H) 5.26(H) 0.78  Sodium 135 - 145 mmol/L 151(H) 147(H) 136  Potassium 3.5 - 5.1 mmol/L 3.8 4.4 3.3(L)  Chloride 101 - 111 mmol/L 116(H) 106 101  CO2 22 - 32 mmol/L 23 23 27   Calcium 8.9 - 10.3 mg/dL 9.9 11.6(H) 9.5    CBC Latest Ref Rng & Units 01/13/2017 01/16/2017 12/25/2016  WBC 3.8 - 10.6 K/uL 40.8(H) 50.2(HH) 20.1(H)  Hemoglobin 13.0 - 18.0 g/dL 13.9 17.9 13.5  Hematocrit 40.0 - 52.0 % 41.8 53.7(H) 39.1(L)  Platelets 150 - 440 K/uL 266 386 286    CXR 08/22: No acute findings  IMPRESSION: 1) Subcutaneous abscess on back - this has been present for several weeks and drained on 2 occasions. 2) pyuria, likely urinary tract infection 3) severe sepsis with septic shock 4) AKI - now nonoliguric, likely ATN, creatinine improving. He does not appear to be a candidate for hemodialysis, even short-term 5) recent C. difficile colitis 6) chronic lymphocytic leukemia 7) acute encephalopathy  PLAN/REC: Antibiotics per ID - presently on piperacillin-tazobactam Continue phenylephrine to maintain MAP >65 mmHg Nephrology service following. Poor candidate for dialysis of any  duration Enteral vancomycin ordered. However, unable to take oral medications presently DNR established. Palliative care consultation requested  Merton Border, MD PCCM service Mobile [phone] Pager [phone] 01/13/2017 11:49 AM

## 2017-01-13 NOTE — Progress Notes (Signed)
Pharmacy Antibiotic Note  William Kennedy is a 76 y.o. male admitted on 12/22/2016 with sepsis/wound infection  Pharmacy has been consulted for Zosyn dosing   Acute Renal failure Patient on Oral Vancomycin PTA for hx Cdiff, Patient has draining wound to left shoulder with pin rose drain in place, Chronic indwelling foley catheter, hx CLL Recent admission to [facility] 7/28-12/27/16- upper back abscess, etc   Plan: Will continue Zosyn 3.375 g EI q 12 hours.    Height: 6' (182.9 cm) Weight: 207 lb 6.4 oz (94.1 kg) IBW/kg (Calculated) : 77.6  Temp (24hrs), Avg:98.2 F (36.8 C), Min:97.7 F (36.5 C), Max:98.6 F (37 C)   Recent Labs Lab 01/20/2017 0948 01/20/2017 1843 01/13/17 0441  WBC 50.2*  --  40.8*  CREATININE 5.26*  --  4.17*  LATICACIDVEN 3.0* 1.9  --     Estimated Creatinine Clearance: 18.2 mL/min (A) (by C-G formula based on SCr of 4.17 mg/dL (H)).    No Known Allergies  Antimicrobials this admission: Zosyn 8/22 >> Vanc 8/22 x 2 Oral vanc 8/22 >>   Dose adjustments this admission:   Microbiology results: 8/22 Wound Cx: GPC in pairs, GNR 8/22 BCx: NGTD 8/22 UCx: Pseudomonas aeruginosa  Thank you for allowing pharmacy to be a part of this patient's care.  Napoleon Form 01/13/2017 3:58 PM

## 2017-01-14 DIAGNOSIS — N3001 Acute cystitis with hematuria: Secondary | ICD-10-CM

## 2017-01-14 LAB — BASIC METABOLIC PANEL
ANION GAP: 10 (ref 5–15)
Anion gap: 9 (ref 5–15)
BUN: 112 mg/dL — ABNORMAL HIGH (ref 6–20)
BUN: 110 mg/dL — ABNORMAL HIGH (ref 6–20)
CALCIUM: 9.2 mg/dL (ref 8.9–10.3)
CHLORIDE: 119 mmol/L — AB (ref 101–111)
CO2: 21 mmol/L — ABNORMAL LOW (ref 22–32)
CO2: 21 mmol/L — ABNORMAL LOW (ref 22–32)
CREATININE: 3.3 mg/dL — AB (ref 0.61–1.24)
CREATININE: 2.84 mg/dL — AB (ref 0.61–1.24)
Calcium: 9.3 mg/dL (ref 8.9–10.3)
Chloride: 118 mmol/L — ABNORMAL HIGH (ref 101–111)
GFR calc non Af Amer: 17 mL/min — ABNORMAL LOW (ref >60)
GFR calc non Af Amer: 20 mL/min — ABNORMAL LOW (ref >60)
GFR, EST AFRICAN AMERICAN: 20 mL/min — AB (ref >60)
GFR, EST AFRICAN AMERICAN: 23 mL/min — AB (ref >60)
Glucose, Bld: 102 mg/dL — ABNORMAL HIGH (ref 65–99)
Glucose, Bld: 119 mg/dL — ABNORMAL HIGH (ref 65–99)
POTASSIUM: 3.6 mmol/L (ref 3.5–5.1)
Potassium: 3.4 mmol/L — ABNORMAL LOW (ref 3.5–5.1)
SODIUM: 150 mmol/L — AB (ref 135–145)
SODIUM: 148 mmol/L — AB (ref 135–145)

## 2017-01-14 LAB — SODIUM, URINE, RANDOM: Sodium, Ur: 16 mmol/L

## 2017-01-14 LAB — CBC
HCT: 39.7 % — ABNORMAL LOW (ref 40.0–52.0)
HEMOGLOBIN: 13.2 g/dL (ref 13.0–18.0)
MCH: 30.1 pg (ref 26.0–34.0)
MCHC: 33.2 g/dL (ref 32.0–36.0)
MCV: 90.5 fL (ref 80.0–100.0)
PLATELETS: 214 10*3/uL (ref 150–440)
RBC: 4.38 MIL/uL — AB (ref 4.40–5.90)
RDW: 14.5 % (ref 11.5–14.5)
WBC: 28.6 10*3/uL — AB (ref 3.8–10.6)

## 2017-01-14 LAB — GLUCOSE, CAPILLARY
GLUCOSE-CAPILLARY: 101 mg/dL — AB (ref 65–99)
GLUCOSE-CAPILLARY: 95 mg/dL (ref 65–99)
Glucose-Capillary: 96 mg/dL (ref 65–99)
Glucose-Capillary: 94 mg/dL (ref 65–99)
Glucose-Capillary: 105 mg/dL — ABNORMAL HIGH (ref 65–99)

## 2017-01-14 LAB — PROCALCITONIN: Procalcitonin: 1.51 ng/mL

## 2017-01-14 MED ORDER — MORPHINE SULFATE (PF) 2 MG/ML IV SOLN: 1.0000 mg | Freq: Four times a day (QID) | INTRAVENOUS | Status: DC

## 2017-01-14 MED ORDER — MORPHINE SULFATE (PF) 2 MG/ML IV SOLN: 2.0000 mg | Freq: Four times a day (QID) | INTRAVENOUS | Status: DC

## 2017-01-14 MED ORDER — PIPERACILLIN-TAZOBACTAM 3.375 G IVPB
3.3750 g | Freq: Three times a day (TID) | INTRAVENOUS | Status: DC
Start: 2017-01-14 — End: 2017-01-15
  Administered 2017-01-14 – 2017-01-15 (×2): 3.375 g via INTRAVENOUS
  Filled 2017-01-14 (×2): qty 50

## 2017-01-14 MED ORDER — FENTANYL CITRATE (PF) 100 MCG/2ML IJ SOLN
25.0000 ug | INTRAMUSCULAR | Status: DC | PRN
  Administered 2017-01-14 (×2): 50 ug via INTRAVENOUS
  Filled 2017-01-14 (×2): qty 2

## 2017-01-14 MED ORDER — DEXTROSE 5 % IV SOLN
INTRAVENOUS | Status: DC
  Administered 2017-01-14 – 2017-01-16 (×3): via INTRAVENOUS

## 2017-01-14 MED ORDER — MORPHINE SULFATE (PF) 2 MG/ML IV SOLN: 1.0000 mg | INTRAVENOUS | Status: DC | PRN

## 2017-01-14 MED ORDER — VANCOMYCIN 50 MG/ML ORAL SOLUTION
125.0000 mg | Freq: Four times a day (QID) | ORAL | Status: DC
  Administered 2017-01-14: 125 mg via ORAL
  Filled 2017-01-14 (×5): qty 2.5

## 2017-01-14 MED ORDER — FENTANYL CITRATE (PF) 100 MCG/2ML IJ SOLN
25.0000 ug | INTRAMUSCULAR | Status: DC | PRN
  Administered 2017-01-14: 25 ug via INTRAVENOUS
  Filled 2017-01-14: qty 2

## 2017-01-14 MED ORDER — HYDROMORPHONE HCL 1 MG/ML IJ SOLN
0.5000 mg | INTRAMUSCULAR | Status: DC | PRN
  Administered 2017-01-14: 22:00:00 1 mg via INTRAVENOUS
  Administered 2017-01-14 – 2017-01-15 (×2): 0.5 mg via INTRAVENOUS
  Administered 2017-01-15 – 2017-01-18 (×18): 1 mg via INTRAVENOUS
  Filled 2017-01-14 (×21): qty 1

## 2017-01-14 NOTE — Progress Notes (Signed)
[facility], [place] 01/14/17  Subjective:   Patient remains critically ill UOP 1200 cc S Na remains critically high at 150; S Cr slightly lower at 3.3 Moaning. Not waking up to answer questions or follow commands  Objective:  Vital signs in last 24 hours:  Temp:  [98.4 F (36.9 C)-99.1 F (37.3 C)] 99.1 F (37.3 C) (08/24 0730) Pulse Rate:  [89-133] 106 (08/24 0745) Resp:  [16-28] 17 (08/24 0745) BP: (77-126)/(40-86) 102/62 (08/24 0730) SpO2:  [78 %-98 %] 96 % (08/24 0745)  Weight change:  Filed Weights   12/22/2016 0945 12/28/2016 1400  Weight: 104.3 kg (230 lb) 94.1 kg (207 lb 6.4 oz)    Intake/Output:    Intake/Output Summary (Last 24 hours) at 01/14/17 0839 Last data filed at 01/14/17 0749  Gross per 24 hour  Intake          3594.05 ml  Output             1550 ml  Net          2044.05 ml   Physical Exam: General:  Chronically ill-appearing gentleman, laying in the bed   HEENT Anicteric, pupils are round, mouth is dry   Neck:  No JVD   Lungs:  clear to auscultation bilaterally   Heart::  Irregular, tachycardic   Abdomen: Soft, nontender, nondistended   Extremities:  SCDs in place, no peripheral edema   Neurologic: Moaning, not following commands  Skin: Decreased turgor      Foley: In place       Basic Metabolic Panel:   Recent Labs Lab 01/14/2017 0948 01/13/17 0441 01/14/17 0510  NA 147* 151* 150*  K 4.4 3.8 3.6  CL 106 116* 119*  CO2 23 23 21*  GLUCOSE 106* 96 102*  BUN 108* 114* 112*  CREATININE 5.26* 4.17* 3.30*  CALCIUM 11.6* 9.9 9.3     CBC:  Recent Labs Lab 12/24/2016 0948 01/13/17 0441 01/14/17 0510  WBC 50.2* 40.8* 28.6*  NEUTROABS 31.1*  --   --   HGB 17.9 13.9 13.2  HCT 53.7* 41.8 39.7*  MCV 90.0 90.3 90.5  PLT 386 266 214     No results found for: HEPBSAG, HEPBSAB, HEPBIGM    Microbiology:  Recent Results (from the past 240 hour(s))  Blood Culture (routine x 2)     Status: None  (Preliminary result)   Collection Time: 12/26/2016  9:48 AM  Result Value Ref Range Status   Specimen Description BLOOD RIGHT AC  Final   Special Requests   Final    BOTTLES DRAWN AEROBIC AND ANAEROBIC Blood Culture adequate volume   Culture NO GROWTH < 24 HOURS  Final   Report Status PENDING  Incomplete  Blood Culture (routine x 2)     Status: None (Preliminary result)   Collection Time: 01/15/2017  9:48 AM  Result Value Ref Range Status   Specimen Description BLOOD RIGHT HAND  Final   Special Requests   Final    BOTTLES DRAWN AEROBIC AND ANAEROBIC Blood Culture results may not be optimal due to an inadequate volume of blood received in culture bottles   Culture NO GROWTH < 24 HOURS  Final   Report Status PENDING  Incomplete  Urine culture     Status: Abnormal (Preliminary result)   Collection Time: 01/08/2017  9:48 AM  Result Value Ref Range Status   Specimen Description URINE, RANDOM  Final   Special Requests NONE  Final   Culture (A)  Final    >=100,000 COLONIES/mL PSEUDOMONAS AERUGINOSA SUSCEPTIBILITIES TO FOLLOW Performed at [facility], [place] [address], [place], [place] [address]    Report Status PENDING  Incomplete  Aerobic/Anaerobic Culture (surgical/deep wound)     Status: None (Preliminary result)   Collection Time: 12/30/2016  5:43 PM  Result Value Ref Range Status   Specimen Description SHOULDER  Final   Special Requests NONE  Final   Gram Stain   Final    MODERATE WBC PRESENT, PREDOMINANTLY PMN MODERATE GRAM POSITIVE COCCI IN PAIRS MODERATE GRAM NEGATIVE RODS    Culture   Final    CULTURE REINCUBATED FOR BETTER GROWTH Performed at [facility], [place] [address] [place],  [address]    Report Status PENDING  Incomplete    Coagulation Studies: No results for input(s): LABPROT, INR in the last 72 hours.  Urinalysis:  Recent Labs  01/19/2017 0948  COLORURINE YELLOW*  LABSPEC 1.017  PHURINE 5.0  GLUCOSEU NEGATIVE  HGBUR LARGE*  BILIRUBINUR  NEGATIVE  KETONESUR NEGATIVE  PROTEINUR 100*  NITRITE NEGATIVE  LEUKOCYTESUR MODERATE*      Imaging: Dg Chest Port 1 View  Result Date: 01/02/2017 CLINICAL DATA:  76 year old male with increasing lethargy, now minimally responsive. Draining wound to left shoulder, status post incision and drainage of subcutaneous abscess of the upper back on 12/21/2016. Fever. EXAM: PORTABLE CHEST 1 VIEW COMPARISON:  Chest radiographs 12/18/2016 and earlier. FINDINGS: Portable AP supine view at 0958 hours. Percutaneous drain projects over the left axilla and scapula. No subcutaneous gas identified. Stable lung volumes. Allowing for portable technique the lungs are clear. Mediastinal contours remain within normal limits. Visualized tracheal air column is within normal limits. No acute osseous abnormality identified. IMPRESSION: 1.  No acute cardiopulmonary abnormality. 2. Left shoulder region percutaneous drain. No acute osseous abnormality identified. Electronically Signed   By: Genevie Ann M.D.   On: 01/02/2017 10:19     Medications:   . dextrose    . phenylephrine (NEO-SYNEPHRINE) Adult infusion 10.133 mcg/min (01/14/17 0749)  . piperacillin-tazobactam (ZOSYN)  IV 3.375 g (01/14/17 0520)   . clopidogrel  75 mg Oral Daily  . docusate sodium  100 mg Oral BID  . feeding supplement (ENSURE ENLIVE)  237 mL Oral TID BM  . finasteride  5 mg Oral Daily  . heparin  5,000 Units Subcutaneous Q8H  . sodium chloride flush  10-40 mL Intracatheter Q12H  . tamsulosin  0.4 mg Oral Daily  . vancomycin  500 mg Oral QID   acetaminophen **OR** acetaminophen, morphine injection, [DISCONTINUED] ondansetron **OR** ondansetron (ZOFRAN) IV, sodium chloride flush  Assessment/ Plan:  76 y.o.caucasian male with with medical problems of Abscess of the left shoulder, history of stroke, CLL, BPH with significantly enlarged prostate gland, sigmoid diverticulitis, calcified right adrenal gland cyst, left ureteral calculus, history of  bladder outlet obstruction in July 2018, history of C. difficile, who was [facility] to [facility] on 12/25/2016 for evaluation of confusion, altered mental status.   1. Acute renal failure due to bladder outlet obstruction Urine output has improved after Foley catheter was changed S Creatinine is slowly improving. UOP 1200 cc  2. Urinary tract infection Broad-spectrum antibiotics  3. Hypernatremia D5W supplementation. Recheck BMP later in the day   4. Left shoulder abscess Broad-spectrum antibiotics Surgery team is following  5. Altered mental status Likely secondary to multiple infections/sepsis/hypernatremia   LOS: 2 [facility] 8/24/20188:39 [place], [place]

## 2017-01-14 NOTE — Progress Notes (Signed)
Pharmacy Antibiotic Note  William Kennedy is a 76 y.o. male admitted on 01/11/2017 with sepsis/wound infection  Pharmacy has been consulted for Zosyn dosing   Acute Renal failure Patient on Oral Vancomycin PTA for hx Cdiff, Patient has draining wound to left shoulder with pin rose drain in place, Chronic indwelling foley catheter, hx CLL Recent admission to [facility] 7/28-12/27/16- upper back abscess, etc   Plan: Will continue Zosyn 3.375 g EI q 8 hours.    Height: 6' (182.9 cm) Weight: 207 lb 6.4 oz (94.1 kg) IBW/kg (Calculated) : 77.6  Temp (24hrs), Avg:98.8 F (37.1 C), Min:98.4 F (36.9 C), Max:99.1 F (37.3 C)   Recent Labs Lab 01/20/2017 0948 12/27/2016 1843 01/13/17 0441 01/14/17 0510  WBC 50.2*  --  40.8* 28.6*  CREATININE 5.26*  --  4.17* 3.30*  LATICACIDVEN 3.0* 1.9  --   --     Estimated Creatinine Clearance: 23 mL/min (A) (by C-G formula based on SCr of 3.3 mg/dL (H)).    No Known Allergies  Antimicrobials this admission: Zosyn 8/22 >>  Vanc 8/22 x 2 Oral vanc 8/22 >>   Dose adjustments this admission:   Microbiology results: 8/22 Wound Cx: GPC in pairs, GNR 8/22 BCx: NGTD 8/22 UCx: Pseudomonas aeruginosa  Thank you for allowing pharmacy to be a part of this patient's care.  Durwin Reges, PharmD Student 01/14/2017 10:51 AM

## 2017-01-14 NOTE — Progress Notes (Addendum)
No charge note  Patient now in 1C.  Wife (Scottie) found me on the floor and was concerned that the patient was in pain.   She made it clear to me that his comfort was a priority over his longevity.  Medications adjusted - morphine will be hard on his kidneys will try dilaudid.  Discussed with RN, Skyler.  Florentina Jenny, PA-C Palliative Medicine Pager: [phone]

## 2017-01-14 NOTE — Progress Notes (Signed)
Pulmonary / Critical Care Attending.   Subjective: Somnolent. Not following commands Moaning at times. Remains on vasopressors No overt respiratory distress  Vitals:   01/14/17 0700 01/14/17 0715 01/14/17 0730 01/14/17 0745  BP: (!) 102/57 114/62 102/62   Pulse: 91 (!) 110 91 (!) 106  Resp: (!) 23 (!) 25 (!) 22 17  Temp:  99.1 F (37.3 C) 99.1 F (37.3 C)   TempSrc:  Axillary Rectal   SpO2: 96% 96% 95% 96%  Weight:      Height:        Chronically ill-appearing Moaning No respiratory distress HEENT: NCAT, sclerae white Neuro: pupils react, MAEs Chest: Slightly coarse breath sounds, no wheezes Cardiac: Tachycardia, regular with extrasystoles, no murmurs noted Abdomen: Soft, diminished bowel sounds, NT Extremities: Cool, no edema  BMP Latest Ref Rng & Units 01/14/2017 01/13/2017 01/21/2017  Glucose 65 - 99 mg/dL 102(H) 96 106(H)  BUN 6 - 20 mg/dL 112(H) 114(H) 108(H)  Creatinine 0.61 - 1.24 mg/dL 3.30(H) 4.17(H) 5.26(H)  Sodium 135 - 145 mmol/L 150(H) 151(H) 147(H)  Potassium 3.5 - 5.1 mmol/L 3.6 3.8 4.4  Chloride 101 - 111 mmol/L 119(H) 116(H) 106  CO2 22 - 32 mmol/L 21(L) 23 23  Calcium 8.9 - 10.3 mg/dL 9.3 9.9 11.6(H)    CBC Latest Ref Rng & Units 01/14/2017 01/13/2017 01/03/2017  WBC 3.8 - 10.6 K/uL 28.6(H) 40.8(H) 50.2(HH)  Hemoglobin 13.0 - 18.0 g/dL 13.2 13.9 17.9  Hematocrit 40.0 - 52.0 % 39.7(L) 41.8 53.7(H)  Platelets 150 - 440 K/uL 214 266 386    CXR 08/22: No acute findings  IMPRESSION: 1) Subcutaneous abscess on back - this has been present for several weeks and drained on 2 occasions. 2) pyuria, likely urinary tract infection 3) severe sepsis with septic shock 4) AKI - now nonoliguric, likely ATN, creatinine improving. He does not appear to be a candidate for hemodialysis, even short-term. Hypernatremia. Will start on D5W 5) recent C. difficile colitis 6) chronic lymphocytic leukemia 7) acute encephalopathy  PLAN/REC: Antibiotics per ID - presently on  piperacillin-tazobactam Continue phenylephrine to maintain MAP >65 mmHg Nephrology service following. Poor candidate for dialysis of any duration Enteral vancomycin ordered. However, unable to take oral medications presently DNR established. Palliative care consultation appreciated.  Hermelinda Dellen, DO

## 2017-01-14 NOTE — Progress Notes (Signed)
Daily Progress Note   Patient Name: William Kennedy       Date: 01/14/2017 DOB: [birth year]  Age: 76 y.o. MRN#: [id number] Attending Physician: William Mandes, MD Primary Care Physician: William Ruths, MD Admit Date: 01/21/2017  Reason for Consultation/Follow-up: Establishing goals of care  Subjective: Examined patient and spoke with William Kennedy at bedside for 45 min.   If pushed to choose between William Kennedy (William Kennedy) comfort vs longevity - William Kennedy wants him comfortable! Her goal is for him to live and to take him home with Hospice Services.  She has a 75 yo dtr who helps with hands on care.   William Kennedy resists engaging with hospice today for 2 reasons - 1) she feels she needs to get the dust bunnies out of the living room before they can deliver equipment, and 2) she is worried if the word hospice is on his discharge papers that his disability checks will stop coming.    I asked William Kennedy what she will do for money after William Kennedy passes - she is worried that if the disability is cancelled then the life insurance from [place] work will also be cancelled.  She is very worried about having FMLA papers filled out.  William Kennedy has accepted that William Kennedy's time is likely very short.  He may not even leave the hospital.  She called her dtrs who live out of [place] last night so that they could attempt to speak with their father.   Assessment:  8/24 patient is off pressors.  BP is stable.  Patient is groaning in pain and rubbing his left side.    Patient Profile/HPI: 76 y.o. male  with past medical history of CLL, cerebral infarct, BPH, and left upper back abscess admitted on 01/01/2017 with lethargy from home. Patient with history of left upper back abscess since approximately March with positive cultures for methicillin  sensitive staphylococcus aureus. Followed by Dr. Tamala Kennedy with general surgery. I&D of wound on 7/30 and has been receiving antibiotics. Recent hospitalization for sepsis secondary to UTI and abscess. Hospital stay complicated by acute stroke on 12/23/16. Also positive for cdiff and receiving vancomycin. Patient/wife declined SNF for rehab and returned home. In ED, patient with severe sepsis and acute kidney injury. Positive for UTI. Phenylephrine initiated. Infectious disease, nephrology, and surgery following. Palliative medicine consultation for goals of  care.     Length of Stay: 2  Current Medications: Scheduled Meds:  . clopidogrel  75 mg Oral Daily  . docusate sodium  100 mg Oral BID  . feeding supplement (ENSURE ENLIVE)  237 mL Oral TID BM  . finasteride  5 mg Oral Daily  . heparin  5,000 Units Subcutaneous Q8H  . sodium chloride flush  10-40 mL Intracatheter Q12H  . tamsulosin  0.4 mg Oral Daily  . vancomycin  500 mg Oral QID    Continuous Infusions: . dextrose    . phenylephrine (NEO-SYNEPHRINE) Adult infusion 10.133 mcg/min (01/14/17 0749)  . piperacillin-tazobactam (ZOSYN)  IV 3.375 g (01/14/17 0520)    PRN Meds: acetaminophen **OR** acetaminophen, fentaNYL (SUBLIMAZE) injection, [DISCONTINUED] ondansetron **OR** ondansetron (ZOFRAN) IV, sodium chloride flush  Physical Exam        Well developed male, groaning and rubbing his left abdomen cv difficult to hear but tachy resp no distress Abdomen soft, tender to palpation   Vital Signs: BP 102/62   Pulse (!) 106   Temp 99.1 F (37.3 C) (Rectal)   Resp 17   Ht 6' (1.829 m)   Wt 94.1 kg (207 lb 6.4 oz)   SpO2 96%   BMI 28.13 kg/m  SpO2: SpO2: 96 % O2 Device: O2 Device: Nasal Cannula O2 Flow Rate: O2 Flow Rate (L/min): 0.5 L/min  Intake/output summary:  Intake/Output Summary (Last 24 hours) at 01/14/17 7829 Last data filed at 01/14/17 0749  Gross per 24 hour  Intake          3571.55 ml  Output             1550 ml    Net          2021.55 ml   LBM: Last BM Date: 01/02/2017 Baseline Weight: Weight: 104.3 kg (230 lb) Most recent weight: Weight: 94.1 kg (207 lb 6.4 oz)       Palliative Assessment/Data:    Flowsheet Rows     Most Recent Value  Intake Tab  Referral Department  Hospitalist  Unit at Time of Referral  ICU  Palliative Care Primary Diagnosis  Sepsis/Infectious Disease  Palliative Care Type  Return patient Palliative Care  Reason for referral  Clarify Goals of Care  Date first seen by Palliative Care  01/13/17  Clinical Assessment  Palliative Performance Scale Score  20%  Psychosocial & Spiritual Assessment  Palliative Care Outcomes  Patient/Family meeting held?  Yes  Who was at the meeting?  wife  Palliative Care Outcomes  Clarified goals of care, Provided end of life care assistance, Provided psychosocial or spiritual support, ACP counseling assistance, Counseled regarding hospice      Patient Active Problem List   Diagnosis Date Noted  . Lactic acidosis   . Back abscess   . Palliative care by specialist   . Sepsis associated hypotension ([place]) 12/25/2016  . Cerebral infarction ([place])   . Goals of care, counseling/discussion   . Palliative care encounter   . Cellulitis   . Urinary tract infection without hematuria   . Confusion 12/06/2016  . Urinary retention   . Microscopic hematuria   . Sepsis ([place]) 11/30/2016  . Left ureteral stone   . Cellulitis and abscess of trunk 10/29/2016  . CLL (chronic lymphocytic leukemia) ([place]) 10/21/2013    Palliative Care Plan    Recommendations/Plan:  PMT will continue to follow - we'll return on Monday if patient is still here  Fentanyl PRN for pain, dyspnea.  Morphine discontinued  due to kidney function  Encouraged William Kennedy to prepare herself and her family - and to go ahead and engage Hospice.  I will request social work to discuss finances (disability check) with William Kennedy  Goals of Care and Additional  Recommendations:  Limitations on Scope of Treatment: Full Scope Treatment, DNR, DNI.  Wife does not want him to be uncomfortable.  Code Status:  DNR  Prognosis:  Days   Discharge Planning:  Home with Hospice vs anticipated hospital death  Care plan was discussed with wife.  Thank you for allowing the Palliative Medicine Team to assist in the care of this patient.  Total time spent:  65 min.     Greater than 50%  of this time was spent counseling and coordinating care related to the above assessment and plan.  Florentina Jenny, PA-C Palliative Medicine  Please contact Palliative MedicineTeam phone at [phone] for questions and concerns between 7 am - 7 pm.   Please see AMION for individual provider pager numbers.

## 2017-01-14 NOTE — Progress Notes (Signed)
He has continued drainage from the abscess site of the left upper back.  Due to magnitude of illness and failure to improve a  decision was made to mostly keep him comfortable and provide terminal care and has been moved out of the intensive care unit.  At present he is sleeping much of the time.  The back abscess continues to drain onto ABD pad and chux.  I discussed this with his wife.  Continue with comfort care.

## 2017-01-14 NOTE — Progress Notes (Signed)
Nurse asked [place] to visit pt, whose wife had requested specifically for [employer]. CH met with pt and wife at bedside. Pt could not speak, but wife states pt is "comfort care." Wife appeared anxious and distraught. However, she talked things that are of value to her, and the struggles the couple have experienced together. Pt heard what his wife was saying at one point, opened his eyes as if he wanted to say something. [place] asked if he could pray for pt, pt shock his head. [place] encouraged wife, offered prayer of comfort for pt and his wife. Pt was being transferred to [facility] [place] to follow up with pt as needed.    01/14/17 1100  Clinical Encounter Type  Visited With Patient and family together  Visit Type Initial;Follow-up;Spiritual support  Referral From Nurse  Consult/Referral To Chaplain  Spiritual Encounters  Spiritual Needs Prayer;Emotional

## 2017-01-14 NOTE — Clinical Social Work Note (Addendum)
CSW consulted for "Wife is very concerned about husband's disability check and subsequent life insurance. If she starts hospice for him will he lose his disability check? Can you discuss with her?". [facility] Social [facility] [employer] is following. Lolita Rieger is the worker [phone]). CSW updated Mrs. Hamill regarding nature of consult. CSW will continue to follow.   Darden Dates, MSW, LCSW  Clinical Social Worker  [phone]

## 2017-01-14 NOTE — Progress Notes (Signed)
[place] [facility] [place] NAME: William Kennedy    MR#:  [id number]  DATE OF BIRTH:  Jul 27, 1940  SUBJECTIVE:  Patient is confused appears lethargic, moaning.  Wife in the room--tearful  REVIEW OF SYSTEMS:   Review of Systems  Unable to perform ROS: Medical condition   Tolerating Diet:npo Tolerating PT: pending  DRUG ALLERGIES:  No Known Allergies  VITALS:  Blood pressure (!) 109/42, pulse (!) 105, temperature 98.4 F (36.9 C), temperature source Oral, resp. rate 20, height 6' (1.829 m), weight 94.1 kg (207 lb 6.4 oz), SpO2 95 %.  PHYSICAL EXAMINATION:   Physical Exam  GENERAL:  76 y.o.-year-old patient lying in the bed with no acute distress. Chronically ill EYES: Pupils equal, round, reactive to light and accommodation. No scleral icterus. Extraocular muscles intact.  HEENT: Head atraumatic, normocephalic. Oropharynx and nasopharynx clear.  NECK:  Supple, no jugular venous distention. No thyroid enlargement, no tenderness.  LUNGS: Normal breath sounds bilaterally, no wheezing, rales, rhonchi. No use of accessory muscles of respiration.  CARDIOVASCULAR: S1, S2 normal. No murmurs, rubs, or gallops.  ABDOMEN: Soft, nontender, nondistended. Bowel sounds present. No organomegaly or mass. Chronic Foley draining cloudy urine EXTREMITIES: No cyanosis, clubbing or edema b/l.    NEUROLOGIC: Unable to assess given patient's with a [place]  PSYCHIATRIC: Patient is lethargic although arousable, moaning SKIN: No obvious rash, lesion, or ulcer.   LABORATORY PANEL:  CBC  Recent Labs Lab 01/14/17 0510  WBC 28.6*  HGB 13.2  HCT 39.7*  PLT 214    Chemistries   Recent Labs Lab 01/20/2017 0948  01/14/17 0510  NA 147*  < > 150*  K 4.4  < > 3.6  CL 106  < > 119*  CO2 23  < > 21*  GLUCOSE 106*  < > 102*  BUN 108*  < > 112*  CREATININE 5.26*  < > 3.30*  CALCIUM 11.6*  < > 9.3  AST 39  --   --   ALT 35  --   --   ALKPHOS 106  --   --    BILITOT 0.8  --   --   < > = values in this interval not displayed. Cardiac Enzymes  Recent Labs Lab 01/14/2017 0948  TROPONINI <0.03   RADIOLOGY:  No results found. ASSESSMENT AND PLAN:   William Kennedy  is a 76 y.o. male with a known history of Previous cerebral infarct, abscess of left shoulder with Penrose drains and on oral antibiotics brought in by wife because of altered mental status, patient was very confused since yesterday. Wife noticed that significant increase in drainage from left shoulder abscess since yesterday night. She is unable to get him to go to doctor's appointment and she noticed that he is very confused and also taking out his Foley catheter. Patient found to have urinary retention up to 1 L on bladder scan in the emergency room  #Acute encephalopathy/altered mental status secondary to sepsis from UTI -Source appears to be urine given significant urinary retention and cloudy urine along with possible left upper back abscess -IV Zosyn and vancomycin (oral --to prevent c diff) -Appreciate infectious disease consultation -Follow blood cultures negative -UC positive for Pseudomonas  2. Leukocytosis -Patient has history of CLL is normal white count is around 25,000 -Came in with white count of 50,000--- down to 40,000--28K  3. History of BPH with urinary retention and chronic Foley catheter that was placed about a month  ago with urinary retention secondary to Foley clogging and now with UTI -Patient's Foley changed -Diuresing well  4. Acute renal failure secondary to ATN due to sepsis and post obstructive uropathy -Patient had urinary retention and a 1 L drained in the ER Foley changed now improving -Continue to monitor creatinine -Appreciate nephrology input -Patient seen by Dr. Candiss Norse -Baseline creatinine 1.08 -Came in with creatinine of 5--- 4.17--3.50 5. Acute hypernatremia hyperchloremia secondary to significant dehydration with poor by mouth  intake -Continue IV fluids  6. Left back abscess with recent drain 2 -Seen by surgery Dr. Tamala Julian--- does not feel abscess is the source of sepsis -Continue to monitor cultures  Above was discussed with patient's wife in the room. Wife understands pt has poor prognosis. She wants to keep him pain free. No aggressive measures. If patient doe not improve then will discuss hospice options.  Very poor prognosis overall  Case discussed with Care Management/Social Worker. Management plans discussed with the family and they are in agreement.  CODE STATUS: DO NOT RESUSCITATE  DVT Prophylaxis: Heparin  TOTAL TIME TAKING CARE OF THIS PATIENT: 25 minutes.  >50% time spent on counselling and coordination of care  POSSIBLE D/C IN few DAYS, DEPENDING ON CLINICAL CONDITION.  Note: This dictation was prepared with Dragon dictation along with smaller phrase technology. Any transcriptional errors that result from this process are unintentional.  Kimarie Coor M.D on 01/14/2017 at 3:35 PM  Between 7am to 6pm - Pager - [phone]  After 6pm go to [email or web address] - password [email or web address] [place] [facility]  Office  [phone]  CC: Primary care physician; [name], MD

## 2017-01-15 LAB — URINE CULTURE: Culture: >=100000 — AB

## 2017-01-15 LAB — GLUCOSE, CAPILLARY
GLUCOSE-CAPILLARY: 99 mg/dL (ref 65–99)
Glucose-Capillary: 93 mg/dL (ref 65–99)

## 2017-01-15 NOTE — Plan of Care (Signed)
Problem: Coping: Goal: Ability to identify and develop effective coping behavior will improve Outcome: Progressing Pt made Comfort Care by Dr Fritzi Mandes this shift

## 2017-01-15 NOTE — Clinical Social Work Note (Signed)
CSW received verbal consult for comfort care. CSW will visit with the family when able to assess for possible hospice facility placement if patient is stable enough for transport.  Santiago Bumpers, MSW, Latanya Presser [phone]

## 2017-01-15 NOTE — Progress Notes (Signed)
[place] at [place] NAME: William Kennedy    MR#:  [id number]  DATE OF BIRTH:  [birth year]  SUBJECTIVE:  Patient is very lethargic. Noncommunicative. Wife in the room. No new issues per RN  REVIEW OF SYSTEMS:   Review of Systems  Unable to perform ROS: Medical condition   Tolerating Diet:npo Tolerating PT: pending  DRUG ALLERGIES:  No Known Allergies  VITALS:  Blood pressure (!) 120/58, pulse 93, temperature 98.7 F (37.1 C), temperature source Axillary, resp. rate (!) 24, height 6' (1.829 m), weight 94.1 kg (207 lb 6.6 oz), SpO2 94 %.  PHYSICAL EXAMINATION:   Physical Exam  GENERAL:  76 y.o.-year-old patient lying in the bed with no acute distress. Chronically ill EYES: Pupils equal, round, reactive to light and accommodation. No scleral icterus. Extraocular muscles intact.  HEENT: Head atraumatic, normocephalic. Oropharynx and nasopharynx clear.  NECK:  Supple, no jugular venous distention. No thyroid enlargement, no tenderness.  LUNGS: Normal breath sounds bilaterally, no wheezing, rales, rhonchi. No use of accessory muscles of respiration.  CARDIOVASCULAR: S1, S2 normal. No murmurs, rubs, or gallops.  ABDOMEN: Soft, nontender, nondistended. Bowel sounds present. No organomegaly or mass. Chronic Foley draining cloudy urine EXTREMITIES: No cyanosis, clubbing or edema b/l.    NEUROLOGIC: Unable to assess given patient's with a Shea  PSYCHIATRIC: Patient is lethargic although arousable, moaning SKIN: No obvious rash, lesion, or ulcer.   LABORATORY PANEL:  CBC  Recent Labs Lab 01/14/17 0510  WBC 28.6*  HGB 13.2  HCT 39.7*  PLT 214    Chemistries   Recent Labs Lab 01/13/2017 0948  01/14/17 1445  NA 147*  < > 148*  K 4.4  < > 3.4*  CL 106  < > 118*  CO2 23  < > 21*  GLUCOSE 106*  < > 119*  BUN 108*  < > 110*  CREATININE 5.26*  < > 2.84*  CALCIUM 11.6*  < > 9.2  AST 39  --   --   ALT 35  --   --   ALKPHOS  106  --   --   BILITOT 0.8  --   --   < > = values in this interval not displayed. Cardiac Enzymes  Recent Labs Lab 12/23/2016 0948  TROPONINI <0.03   RADIOLOGY:  No results found. ASSESSMENT AND PLAN:   William Kennedy  is a 76 y.o. male with a known history of Previous cerebral infarct, abscess of left shoulder with Penrose drains and on oral antibiotics brought in by wife because of altered mental status, patient was very confused since yesterday. Wife noticed that significant increase in drainage from left shoulder abscess since yesterday night. She is unable to get him to go to doctor's appointment and she noticed that he is very confused and also taking out his Foley catheter. Patient found to have urinary retention up to 1 L on bladder scan in the emergency room  #Acute encephalopathy/altered mental status secondary to sepsis from UTI -Source appears to be urine given significant urinary retention and cloudy urine along with possible left upper back abscess -IV Zosyn and vancomycin (oral --to prevent c diff)---discontinued antibiotics -Appreciate infectious disease consultation -Follow blood cultures negative -UC positive for Pseudomonas  2. Leukocytosis -Patient has history of CLL is normal white count is around 25,000 -Came in with white count of 50,000--- down to 40,000--28K  3. History of BPH with urinary retention and chronic Foley catheter that was  placed about a month ago with urinary retention secondary to Foley clogging and now with UTI -Patient's Foley changed -Diuresing well  4. Acute renal failure secondary to ATN due to sepsis and post obstructive uropathy -Patient had urinary retention and a 1 L drained in the ER Foley changed now improving -Continue to monitor creatinine -Appreciate nephrology input -Patient seen by Dr. Candiss Norse -Baseline creatinine 1.08 -Came in with creatinine of 5--- 4.17--3.50  5. Acute hypernatremia hyperchloremia secondary to significant  dehydration with poor by mouth intake -Continue IV fluids  6. Left back abscess with recent drain 2 -Seen by surgery Dr. Tamala Julian--- does not feel abscess is the source of sepsis -Continue to monitor cultures  Above was discussed with patient's wife in the room. Wife understands pt has poor prognosis. She wants to keep him pain free. No aggressive measures. Discussed today with wife. Patient is now comfort measures only. Hospice home option discussed versus home with hospice. Very poor prognosis overall. Wife understands patient may not survive hospital stay.  Patient is DO NOT RESUSCITATE nurse may pronounce  Case discussed with Care Management/Social Worker. Management plans discussed with the family and they are in agreement.  CODE STATUS: DO NOT RESUSCITATE  DVT Prophylaxis: Heparin  TOTAL TIME TAKING CARE OF THIS PATIENT: 25 minutes.  >50% time spent on counselling and coordination of care  Note: This dictation was prepared with Dragon dictation along with smaller phrase technology. Any transcriptional errors that result from this process are unintentional.  Tank Difiore M.D on 01/15/2017 at 11:27 AM  Between 7am to 6pm - Pager - [phone]  After 6pm go to [email or web address] - password [email or web address] [facility]  Office  [phone]  CC: Primary care physician; [name], MD

## 2017-01-16 NOTE — Plan of Care (Signed)
Problem: Role Relationship: Goal: Familys ability to cope with current situation will improve Outcome: Progressing Offered emotional support to patient's wife.   Problem: Pain Management: Goal: General experience of comfort will improve Outcome: Progressing PRN pain medication given as ordered with relief noted

## 2017-01-16 NOTE — Progress Notes (Signed)
[place] at [place] NAME: William Kennedy    MR#:  [id number]  DATE OF BIRTH:  Aug 13, 1940  SUBJECTIVE:  Patient is very lethargic. Noncommunicative. Wife in the room. No new issues per RN Pt with comfort care REVIEW OF SYSTEMS:   Review of Systems  Unable to perform ROS: Medical condition   Tolerating Diet:npo Tolerating PT: pending  DRUG ALLERGIES:  No Known Allergies  VITALS:  Blood pressure 122/72, pulse 83, temperature 99.5 F (37.5 C), temperature source Axillary, resp. rate 12, height 6' (1.829 m), weight 94.1 kg (207 lb 6.6 oz), SpO2 94 %.  PHYSICAL EXAMINATION:   Physical Exam  GENERAL:  76 y.o.-year-old patient lying in the bed with no acute distress. Chronically ill Limited exam HEENT: Head atraumatic, normocephalic. Oropharynx and nasopharynx clear.  NECK:  Supple, no jugular venous distention. No thyroid enlargement, no tenderness.  LUNGS: Normal breath sounds bilaterally, no wheezing, rales, rhonchi. No use of accessory muscles of respiration.  CARDIOVASCULAR: S1, S2 normal. No murmurs, rubs, or gallops.    NEUROLOGIC: Unable to assess given patient's condition PSYCHIATRIC: Patient is lethargic   LABORATORY PANEL:  CBC  Recent Labs Lab 01/14/17 0510  WBC 28.6*  HGB 13.2  HCT 39.7*  PLT 214    Chemistries   Recent Labs Lab 12/29/2016 0948  01/14/17 1445  NA 147*  < > 148*  K 4.4  < > 3.4*  CL 106  < > 118*  CO2 23  < > 21*  GLUCOSE 106*  < > 119*  BUN 108*  < > 110*  CREATININE 5.26*  < > 2.84*  CALCIUM 11.6*  < > 9.2  AST 39  --   --   ALT 35  --   --   ALKPHOS 106  --   --   BILITOT 0.8  --   --   < > = values in this interval not displayed. Cardiac Enzymes  Recent Labs Lab 01/09/2017 0948  TROPONINI <0.03   RADIOLOGY:  No results found. ASSESSMENT AND PLAN:   William Kennedy  is a 76 y.o. male with a known history of Previous cerebral infarct, abscess of left shoulder with Penrose  drains and on oral antibiotics brought in by wife because of altered mental status, patient was very confused since yesterday. Wife noticed that significant increase in drainage from left shoulder abscess since yesterday night. She is unable to get him to go to doctor's appointment and she noticed that he is very confused and also taking out his Foley catheter. Patient found to have urinary retention up to 1 L on bladder scan in the emergency room  #Acute encephalopathy/altered mental status secondary to sepsis from UTI  2. Leukocytosis -Patient has history of CLL is normal white count is around 25,000  3. History of BPH with urinary retention and chronic Foley catheter that was placed about a month ago with urinary retention secondary to Foley clogging and now with UTI -Patient's Foley changed  4. Acute renal failure secondary to ATN due to sepsis and post obstructive uropathy  5. Acute hypernatremia hyperchloremia secondary to significant dehydration with poor by mouth intake  6. Left back abscess with recent drain 2  Discussed  with wife. Patient is now comfort measures only. Hospice home option discussed versus home with hospice. Very poor prognosis overall. Wife understands patient may not survive hospital stay.  Patient is DO NOT RESUSCITATE nurse may pronounce  Case discussed with  Care Management/Social Worker. Management plans discussed with the family and they are in agreement.  CODE STATUS: DO NOT RESUSCITATE   TOTAL TIME TAKING CARE OF THIS PATIENT: 15 minutes.  >50% time spent on counselling and coordination of care wife  Note: This dictation was prepared with Dragon dictation along with smaller phrase technology. Any transcriptional errors that result from this process are unintentional.  Amere Iott M.D on 01/16/2017 at 3:17 PM  Between 7am to 6pm - Pager - [phone]  After 6pm go to [email or web address] - password [email or web address] [facility]  Office   [phone]  CC: Primary care physician; [name], MD

## 2017-01-16 NOTE — Plan of Care (Signed)
Problem: Fluid Volume: Goal: Ability to maintain a balanced intake and output will improve Outcome: Not Progressing IVF stopped today per MD order; montioring output from Foley catheter; pt remains on Comfort Care

## 2017-01-16 NOTE — Care Management Note (Signed)
Case Management Note  Patient Details  Name: William Kennedy MRN: [id number] Date of Birth: 1940/09/07  Subjective/Objective:     A hospice referral was requested to this writer by Dr Fritzi Mandes for [name]. Dr Posey Pronto has already seen and explained the discharge plan to [name] of going home with Hospice services on Tuesday. This Probation officer called [name] to ask if she had a preference of Hospice providers and [name] reported that she had been told that [name] could stay at [facility] "until the end." This writer then provided [name] with a list of hospice providers from which to choose and she chose 'the first one" which was [facility]. A referral was called and faxed to [facility]                  Expected Discharge Date:                 To  Expected Discharge Plan:     In-House Referral:     Discharge planning Services     Post Acute Care Choice:    Choice offered to:     DME Arranged:    DME Agency:     HH Arranged:    HH Agency:     Status of Service:     If discussed at [employer] of Stay Meetings, dates discussed:    Additional Comments:  Yamna Mackel A, RN 01/16/2017, 9:51 AM

## 2017-01-16 NOTE — Clinical Social Work Note (Signed)
CSW received update from attending MD that the patient does not want facility-based hospice care. CSW is signing off.  Santiago Bumpers, MSW, Latanya Presser [phone]

## 2017-01-17 DIAGNOSIS — Z515 Encounter for palliative care: Secondary | ICD-10-CM

## 2017-01-17 LAB — AEROBIC/ANAEROBIC CULTURE (SURGICAL/DEEP WOUND)

## 2017-01-17 LAB — CULTURE, BLOOD (ROUTINE X 2)
CULTURE: NO GROWTH
Culture: NO GROWTH
Special Requests: ADEQUATE

## 2017-01-17 LAB — AEROBIC/ANAEROBIC CULTURE W GRAM STAIN (SURGICAL/DEEP WOUND)

## 2017-01-17 MED ORDER — HALOPERIDOL LACTATE 2 MG/ML PO CONC
0.5000 mg | ORAL | Status: DC | PRN
  Filled 2017-01-17: qty 0.3

## 2017-01-17 MED ORDER — GLYCOPYRROLATE 0.2 MG/ML IJ SOLN
0.2000 mg | INTRAMUSCULAR | Status: DC | PRN
  Filled 2017-01-17: qty 1

## 2017-01-17 MED ORDER — POLYVINYL ALCOHOL 1.4 % OP SOLN
1.0000 [drp] | Freq: Four times a day (QID) | OPHTHALMIC | Status: DC | PRN
Start: 2017-01-17 — End: 2017-01-19
  Filled 2017-01-17: qty 15

## 2017-01-17 MED ORDER — ONDANSETRON HCL 4 MG/2ML IJ SOLN: 4.0000 mg | Freq: Four times a day (QID) | INTRAMUSCULAR | Status: DC | PRN

## 2017-01-17 MED ORDER — ACETAMINOPHEN 650 MG RE SUPP: 650.0000 mg | Freq: Four times a day (QID) | RECTAL | Status: DC | PRN

## 2017-01-17 MED ORDER — HALOPERIDOL LACTATE 5 MG/ML IJ SOLN: 0.5000 mg | INTRAMUSCULAR | Status: DC | PRN

## 2017-01-17 MED ORDER — GLYCOPYRROLATE 1 MG PO TABS
1.0000 mg | ORAL_TABLET | ORAL | Status: DC | PRN
  Filled 2017-01-17: qty 1

## 2017-01-17 MED ORDER — LORAZEPAM 2 MG/ML IJ SOLN: 1.0000 mg | INTRAMUSCULAR | Status: DC | PRN

## 2017-01-17 MED ORDER — ACETAMINOPHEN 325 MG PO TABS: 650.0000 mg | ORAL_TABLET | Freq: Four times a day (QID) | ORAL | Status: DC | PRN

## 2017-01-17 MED ORDER — ONDANSETRON 4 MG PO TBDP
4.0000 mg | ORAL_TABLET | Freq: Four times a day (QID) | ORAL | Status: DC | PRN
  Filled 2017-01-17: qty 1

## 2017-01-17 MED ORDER — HALOPERIDOL 0.5 MG PO TABS
0.5000 mg | ORAL_TABLET | ORAL | Status: DC | PRN
  Filled 2017-01-17: qty 1

## 2017-01-17 MED ORDER — LORAZEPAM 2 MG/ML PO CONC: 1.0000 mg | ORAL | Status: DC | PRN

## 2017-01-17 MED ORDER — LORAZEPAM 1 MG PO TABS: 1.0000 mg | ORAL_TABLET | ORAL | Status: DC | PRN

## 2017-01-17 NOTE — Progress Notes (Signed)
Per palliative care who met with patient's wife continue comfort care here in the hospital.

## 2017-01-17 NOTE — Progress Notes (Signed)
[place] at [place] NAME: William Kennedy    MR#:  [id number]  DATE OF BIRTH:  02-Jul-1940  SUBJECTIVE:  Patient is very lethargic. Noncommunicative. Wife in the room. No new issues per RN Pt with comfort care REVIEW OF SYSTEMS:   Review of Systems  Unable to perform ROS: Medical condition   Tolerating Diet:npo Tolerating PT: pending  DRUG ALLERGIES:  No Known Allergies  VITALS:  Blood pressure 106/72, pulse (!) 122, temperature (!) 100.4 F (38 C), temperature source Axillary, resp. rate 14, height 6' (1.829 m), weight 94.1 kg (207 lb 6.6 oz), SpO2 93 %.  PHYSICAL EXAMINATION:   Physical Exam  GENERAL:  76 y.o.-year-old patient lying in the bed with no acute distress. Chronically ill Limited exam HEENT: Head atraumatic, normocephalic. Oropharynx and nasopharynx clear.  NECK:  Supple, no jugular venous distention. No thyroid enlargement, no tenderness.  LUNGS: Normal breath sounds bilaterally, no wheezing, rales, rhonchi. No use of accessory muscles of respiration.  CARDIOVASCULAR: S1, S2 normal. No murmurs, rubs, or gallops.    NEUROLOGIC: Unable to assess given patient's condition PSYCHIATRIC: Patient is lethargic   LABORATORY PANEL:  CBC  Recent Labs Lab 01/14/17 0510  WBC 28.6*  HGB 13.2  HCT 39.7*  PLT 214    Chemistries   Recent Labs Lab 01/04/2017 0948  01/14/17 1445  NA 147*  < > 148*  K 4.4  < > 3.4*  CL 106  < > 118*  CO2 23  < > 21*  GLUCOSE 106*  < > 119*  BUN 108*  < > 110*  CREATININE 5.26*  < > 2.84*  CALCIUM 11.6*  < > 9.2  AST 39  --   --   ALT 35  --   --   ALKPHOS 106  --   --   BILITOT 0.8  --   --   < > = values in this interval not displayed. Cardiac Enzymes  Recent Labs Lab 01/13/2017 0948  TROPONINI <0.03   RADIOLOGY:  No results found. ASSESSMENT AND PLAN:   William Kennedy  is a 76 y.o. male with a known history of Previous cerebral infarct, abscess of left shoulder with  Penrose drains and on oral antibiotics brought in by wife because of altered mental status, patient was very confused since yesterday. Wife noticed that significant increase in drainage from left shoulder abscess since yesterday night. She is unable to get him to go to doctor's appointment and she noticed that he is very confused and also taking out his Foley catheter. Patient found to have urinary retention up to 1 L on bladder scan in the emergency room  #Acute encephalopathy/altered mental status secondary to sepsis from UTI  2. Leukocytosis -Patient has history of CLL is normal white count is around 25,000  3. History of BPH with urinary retention and chronic Foley catheter that was placed about a month ago with urinary retention secondary to Foley clogging and now with UTI -Patient's Foley changed  4. Acute renal failure secondary to ATN due to sepsis and post obstructive uropathy  5. Acute hypernatremia hyperchloremia secondary to significant dehydration with poor by mouth intake  6. Left back abscess with recent drain 2  Discussed  with wife. Patient is now comfort measures only. Hospice home option discussed which wife declined She is considering home with hospice. Very poor prognosis overall. CM to make arrangements for hospice at home.  Patient is DO NOT RESUSCITATE  And nurse may pronounce  Case discussed with Care Management/Social Worker. Management plans discussed with the family and they are in agreement.  CODE STATUS: DO NOT RESUSCITATE   TOTAL TIME TAKING CARE OF THIS PATIENT: 15 minutes.  >50% time spent on counselling and coordination of care wife  Note: This dictation was prepared with Dragon dictation along with smaller phrase technology. Any transcriptional errors that result from this process are unintentional.  Sharniece Gibbon M.D on 01/17/2017 at 8:32 AM  Between 7am to 6pm - Pager - [phone]  After 6pm go to [email or web address] - password [email or web address] [place]  Office  [phone]  CC: Primary care physician; [name], MD

## 2017-01-17 NOTE — Progress Notes (Signed)
New referral for hospice to follow at home received over the weekend. Patient was seen this morning by Palliative Medicine NP William Kennedy, at this time he is too unstable for transport and will remains at [facility] for end of life. Referral updated. Thank you. Flo Shanks RN, BSN, [facility] [place], hospital liaison [phone] c

## 2017-01-17 NOTE — Progress Notes (Signed)
Nutrition Brief Note  Patient identified to be seen for low Braden score. Chart reviewed. Patient has transitioned to comfort care.  No diet order at this time. No nutrition interventions warranted at this time. Please consult RD as needed.  Willey Blade, MS, RD, LDN Pager: [phone] After Hours Pager: [phone]

## 2017-01-17 NOTE — Progress Notes (Signed)
Daily Progress Note   Patient Name: William Kennedy       Date: 01/17/2017 DOB: [birth year]  Age: 76 y.o. MRN#: [id number] Attending Physician: Fritzi Mandes, MD Primary Care Physician: [name], MD Admit Date: 01/14/2017  Reason for Consultation/Follow-up: Establishing goals of care and Terminal Care  Subjective: Patient in bed, non responsive. Actively dying.  Wife at bedside. States she wants husband to die in her arms. She does not want to transfer him for fear that he may die during transfer, and I agree with her.   Review of Systems  Unable to perform ROS: Patient unresponsive    Length of Stay: 5  Current Medications: Scheduled Meds:  . sodium chloride flush  10-40 mL Intracatheter Q12H    Continuous Infusions:   PRN Meds: [DISCONTINUED] acetaminophen **OR** acetaminophen, HYDROmorphone, [DISCONTINUED] ondansetron **OR** ondansetron (ZOFRAN) IV, sodium chloride flush  Physical Exam  Constitutional: He appears well-developed and well-nourished.  Cardiovascular:  Irregular, pulses weak, thready  Pulmonary/Chest: Effort normal.  Abdominal: Soft.  Neurological: He is alert.  Skin: Skin is warm.  Nursing note and vitals reviewed.           Vital Signs: BP 106/72 (BP Location: Left Arm)   Pulse (!) 122   Temp (!) 100.4 F (38 C) (Axillary)   Resp 14   Ht 6' (1.829 m)   Wt 94.1 kg (207 lb 6.6 oz)   SpO2 93%   BMI 28.13 kg/m  SpO2: SpO2: 93 % O2 Device: O2 Device: Not Delivered O2 Flow Rate: O2 Flow Rate (L/min): 0.5 L/min  Intake/output summary: No intake or output data in the 24 hours ending 01/17/17 0951 LBM: Last BM Date: 01/16/17 Baseline Weight: Weight: 104.3 kg (230 lb) Most recent weight: Weight: 94.1 kg (207 lb 6.6 oz)       Palliative  Assessment/Data: 10%    Flowsheet Rows     Most Recent Value  Intake Tab  Referral Department  Hospitalist  Unit at Time of Referral  ICU  Palliative Care Primary Diagnosis  Sepsis/Infectious Disease  Palliative Care Type  Return patient Palliative Care  Reason for referral  Clarify Goals of Care  Date first seen by Palliative Care  01/13/17  Clinical Assessment  Palliative Performance Scale Score  20%  Psychosocial & Spiritual Assessment  Palliative Care  Outcomes  Patient/Family meeting held?  Yes  Who was at the meeting?  wife  Palliative Care Outcomes  Clarified goals of care, Provided end of life care assistance, Provided psychosocial or spiritual support, ACP counseling assistance, Counseled regarding hospice      Patient Active Problem List   Diagnosis Date Noted  . Acute cystitis with hematuria   . Lactic acidosis   . Back abscess   . Palliative care by specialist   . Sepsis associated hypotension ([place]) 01/10/2017  . Cerebral infarction ([place])   . Goals of care, counseling/discussion   . Palliative care encounter   . Cellulitis   . Urinary tract infection without hematuria   . Confusion 12/06/2016  . Urinary retention   . Microscopic hematuria   . Sepsis ([place]) 11/30/2016  . Left ureteral stone   . Cellulitis and abscess of trunk 10/29/2016  . CLL (chronic lymphocytic leukemia) ([place]) 10/21/2013    Palliative Care Assessment & Plan   Patient Profile:76 y.o. male  with past medical history of CLL, cerebral infarct, BPH, and left upper back abscess admitted on 01/11/2017 with lethargy from home. Patient with history of left upper back abscess since approximately March with positive cultures for methicillin sensitive staphylococcus aureus. Followed by Dr. Tamala Julian with general surgery. I&D of wound on 7/30 and has been receiving antibiotics. Recent hospitalization for sepsis secondary to UTI and abscess. Hospital stay complicated by acute stroke on 12/23/16. Also positive for  cdiff and receiving vancomycin. Patient/wife declined SNF for rehab and returned home. In ED, patient with severe sepsis and acute kidney injury. Positive for UTI. Phenylephrine initiated. Infectious disease, nephrology, and surgery following. Palliative medicine consultation for goals of care. Patient care transitioned to comfort measures only on 8/25.   Assessment/Recommendations/Plan   Continue comfort measures only  Patient is not stable for transfer to home with Hospice or Hospice home for concerns of dying during transport  Anticipate hospital death   Goals of Care and Additional Recommendations:  Limitations on Scope of Treatment: Full Comfort Care  Code Status:  DNR  Prognosis:   Hours - Days  Discharge Planning:  Anticipated Hospital Death  Care plan was discussed with patient's spouse, [name], Care Manager RN.  Thank you for allowing the Palliative Medicine Team to assist in the care of this patient.   Time In: 0915 Time Out: 1000 Total Time 45 mins Prolonged Time Billed No      Greater than 50%  of this time was spent counseling and coordinating care related to the above assessment and plan.  Mariana Kaufman, AGNP-C Palliative Medicine   Please contact Palliative Medicine Team phone at [phone] for questions and concerns.

## 2017-01-17 NOTE — Progress Notes (Signed)
Chaplain made a follow up visit with pt. His wife was a bedside. Pt is comfort care and nonverbal. Wife states she is ready to let him go, but was concerned about suggestions she received to move pt to home care. She is afraid that might die of transit. R[place]t encouraged pt's wife, offered spiritual support, prayer and a ministry of presence. [place] informed wife that [facility] services would be available as needed.   01/17/17 1600  Clinical Encounter Type  Visited With Patient and family together  Visit Type Follow-up;Spiritual support  Referral From [place] Needs Prayer;Emotional

## 2017-01-18 ENCOUNTER — Ambulatory Visit: Payer: Self-pay | Admitting: Urology

## 2017-01-18 MED ORDER — HYDROMORPHONE HCL 1 MG/ML IJ SOLN
0.5000 mg | INTRAMUSCULAR | Status: DC | PRN
  Administered 2017-01-18 (×2): 1 mg via INTRAVENOUS
  Filled 2017-01-18 (×3): qty 1

## 2017-01-22 NOTE — Clinical Social Work Note (Addendum)
Patient on comfort care, hospital death anticipated, CSW to sign off please reconsult if other social work needs arise.  Jones Broom. [place], MSW, [place]  02/16/2017 12:05 PM

## 2017-01-22 NOTE — Final Progress Note (Signed)
This RN called into room by pt's wife.  Pt pale, no heart or lung sounds auscultated.  This RN provided emotional support to pt's wife, Scottie.  Verified w/ Adella Hare RN.  This RN informed Dr. Jannifer Franklin.  CDS released as donor.  Funeral home arrangements made by [facility].

## 2017-01-22 NOTE — Plan of Care (Signed)
Problem: Coping: Goal: Ability to identify and develop effective coping behavior will improve Outcome: Progressing Pt remains comfort care. Wife at bedside. Emotional support provided

## 2017-01-22 NOTE — Discharge Summary (Signed)
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  Brief Hospital Course (including significant findings, care, treatment, and services provided and events leading to death)  HowardMooreis a 76 y.o.malewith a known history of Previous cerebral infarct, abscess of left shoulder with Penrose drains and on oral antibiotics brought in by wife because of altered mental status.  Patient was admitted with acute encephalopathy secondary to sepsis from UTI. He was started on IV fluids and broad-spectrum antibiotics. He had significant renal failure as well. Patient remained unresponsive. Her nausea much improvement. Lengthy discussion was made by palliative care and  regarding patient's poor prognosis.patient's wife requested no aggressive workup and requested comfort measures. Patient was kept in the hospital and passed away peacefully.  Patient remained DO NOT RESUSCITATE  Pertinent Labs and Studies  Significant Diagnostic Studies Ct Head Wo Contrast  Result Date: 12/24/2016 CLINICAL DATA:  76 year old male with left facial droop, right arm and leg weakness. Left MCA and PCA territory infarcts suspected on brain MRI yesterday. EXAM: CT HEAD WITHOUT CONTRAST TECHNIQUE: Contiguous axial images were obtained from the base of the skull through the vertex without intravenous contrast. COMPARISON:  Brain MRI 12/23/2016, head CT 12/23/2016.  FINDINGS: Brain: Acute on chronic left lateral occipital evolving infarct and and subtle acute superior left MCA cortical and white matter infarct corresponding to the MRI findings yesterday. No associated hemorrhage or mass effect. Minimal increased hypodensity on CT compared to 12/23/2016. Stable gray-white matter differentiation elsewhere with confluent bilateral chronic white matter hypodensity. No intracranial mass effect. Stable ventricle size and configuration. Vascular: Calcified atherosclerosis at the skull base. Skull: No acute osseous abnormality identified. Sinuses/Orbits: stable and well pneumatized aside from bubbly opacity in the left sphenoid. Other: No acute orbit or scalp soft tissue findings. IMPRESSION: 1. 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 No associated hemorrhage or mass effect. 2. No new intracranial abnormality. Electronically Signed   By: Genevie Ann M.D.   On: 12/24/2016 12:20   Ct Head Wo Contrast  Result Date: 12/23/2016 CLINICAL DATA:  New onset right arm weakness. EXAM: CT HEAD WITHOUT CONTRAST TECHNIQUE: Contiguous axial images were obtained from the base of the skull through the vertex without intravenous contrast. COMPARISON:  11/30/2016 FINDINGS: Brain: Diffuse cerebral atrophy. Mild ventricular dilatation consistent with central atrophy. Low-attenuation changes in the deep white matter consistent small vessel ischemia. Cavum septum pellucidum. Small area of old encephalomalacia in the left posterior parietal lobe unchanged since previous study. No evidence of acute infarction, hemorrhage, hydrocephalus, extra-axial collection or mass lesion/mass effect. Vascular: Vascular calcifications are present. Skull: No depressed skull fractures. Sinuses/Orbits: Paranasal sinuses and mastoid air cells are clear. Other: No change since previous study. IMPRESSION: No acute intracranial abnormalities. Chronic atrophy and small vessel  ischemic changes. 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 No change since previous studies. Electronically Signed   By: Oren Beckmann.D.  On: 12/23/2016 03:35   Mr [name] Head Wo Contrast  Addendum Date: 12/23/2016   ADDENDUM REPORT: 12/23/2016 11:20 ADDENDUM: Study discussed by telephone with Dr. Gladstone Lighter on 12/23/2016 at 1113 hours. Electronically Signed   By: Genevie Ann M.D.   On: 12/23/2016 11:20   Result Date: 12/23/2016 CLINICAL DATA:  76 year old male recently treated for sepsis. New onset right upper extremity weakness upon waking at 0300 hours. Weakness progressing to the right leg. Subsequent dense right side hemiplegia. EXAM: MRI HEAD WITHOUT CONTRAST MRA HEAD WITHOUT CONTRAST MRA NECK WITHOUT AND WITH CONTRAST TECHNIQUE: Multiplanar, multiecho pulse sequences of the brain and surrounding structures were obtained without intravenous contrast. Angiographic images of the Circle of Willis were obtained using MRA technique without intravenous contrast. Angiographic images of the neck were obtained using MRA technique without and with intravenous contrast. Carotid stenosis measurements (when applicable) are obtained utilizing NASCET criteria, using the distal internal carotid diameter as the denominator. CONTRAST:  53mL MULTIHANCE GADOBENATE DIMEGLUMINE 529 MG/ML IV SOLN COMPARISON:  Head CT without contrast 0323 hours today. FINDINGS: MRI HEAD FINDINGS Brain: Patchy acute areas of restricted diffusion in the left hemisphere including the left superior frontal gyrus, motor strip, sensory strip, pre motor area, left centrum semiovale, and also in the left superior occipital lobe and occipital pole cortex. No contralateral right hemisphere or posterior fossa restricted diffusion identified. T2 and FLAIR hyperintensity in the affected areas with no associated hemorrhage or mass effect. Underlying inferior left parietal lobe cortical encephalomalacia and hemosiderin (series 14,  image 15). Patchy and confluent bilateral superimposed cerebral white matter T2 and FLAIR hyperintensity. Small chronic microhemorrhage also in the right occipital lobe white matter. 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 Vascular: Absent flow void in the distal right vertebral artery, preserved distal left vertebral artery and basilar flow void. ICA siphon flow voids are preserved. Cavum septum pellucidum anatomic variant. No ventriculomegaly. No midline shift. Skull and upper cervical spine: Negative. Sinuses/Orbits: Negative aside from mild mucosal thickening and/or fluid in the left sphenoid sinus. Other: Trace right mastoid fluid. Visible internal auditory structures appear normal. Negative scalp soft tissues. MRA NECK FINDINGS Precontrast time-of-flight images demonstrate asymmetrically decreased antegrade flow signal in the cervical left ICA. Bilateral CCA flow signal appears fairly symmetric. Preserved right ICA flow signal. Flow signal in the left vertebral artery which appears dominant in the neck. Post-contrast MRA images reveal a radiographic string sign stenosis at the left ICA origin, and highly irregular contour of the left ICA bulb with additional mild stenosis at the distal bulb level. See series 26, image 52 and series 110, image 16. Despite these findings there is preserved enhancement of the left ICA to the skullbase and into the left ICA siphon. Only mild irregularity of the right CCA and cervical right ICA. Proximal subclavian arteries are patent but irregular. The right vertebral artery is occluded from its origin to the V3 segment with only faint enhancement in the V4 segment and at the right PICA 0 which may be retrograde. There is mild stenosis at the left vertebral artery origin. The left vertebral artery is otherwise normal to the vertebrobasilar junction. MRA HEAD FINDINGS Absent antegrade flow in the distal right vertebral artery. Preserved flow in the  distal left vertebral artery and basilar. SCA and right PCA origins are patent. The left P1 segment is irregular and stenotic. Left posterior communicating artery appears to be the primary source of left PCA flow the left P2 segment is normal.  Left P3 branches are attenuated. Right PCA branches have a more normal appearance. Right posterior communicating artery is diminutive or absent. Antegrade flow in the right ICA siphon to the right terminus with no stenosis. Patent right MCA and ACA origins. Decreased antegrade flow signal in the vertical petrous and proximal cavernous segment of the left ICA siphon (series 104, image 6), but the supraclinoid segment and left ICA to sinus are patent. Left MCA origin and M1 segment are patent with only mild irregularity. Left MCA bifurcation is patent. No left MCA branch occlusion is identified. The left ACA origin is severely stenotic. Patent anterior communicating artery. A2 segments appear symmetric and within normal limits. Moderate stenosis at the right MCA origin, but visible right MCA branches are otherwise within normal limits. IMPRESSION: 1. 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 No associated hemorrhage or mass effect. 2. 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 Patent left MCA, and no left MCA branch occlusion identified. 3. Proximal left PCA P1 stenosis with left posterior communicating artery contribution to the left PCA, corresponding to the left PCA territory involvement. 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 Electronically Signed: By: Genevie Ann M.D. On: 12/23/2016 10:56   Mr [name] Neck W Wo Contrast  Addendum Date: 12/23/2016   ADDENDUM REPORT: 12/23/2016 11:20 ADDENDUM: Study discussed by telephone with Dr. Gladstone Lighter on  12/23/2016 at 1113 hours. Electronically Signed   By: Genevie Ann M.D.   On: 12/23/2016 11:20   Result Date: 12/23/2016 CLINICAL DATA:  76 year old male recently treated for sepsis. New onset right upper extremity weakness upon waking at 0300 hours. Weakness progressing to the right leg. Subsequent dense right side hemiplegia. EXAM: MRI HEAD WITHOUT CONTRAST MRA HEAD WITHOUT CONTRAST MRA NECK WITHOUT AND WITH CONTRAST TECHNIQUE: Multiplanar, multiecho pulse sequences of the brain and surrounding structures were obtained without intravenous contrast. Angiographic images of the Circle of Willis were obtained using MRA technique without intravenous contrast. Angiographic images of the neck were obtained using MRA technique without and with intravenous contrast. Carotid stenosis measurements (when applicable) are obtained utilizing NASCET criteria, using the distal internal carotid diameter as the denominator. CONTRAST:  62mL MULTIHANCE GADOBENATE DIMEGLUMINE 529 MG/ML IV SOLN COMPARISON:  Head CT without contrast 0323 hours today. FINDINGS: MRI HEAD FINDINGS Brain: Patchy acute areas of restricted diffusion in the left hemisphere including the left superior frontal gyrus, motor strip, sensory strip, pre motor area, left centrum semiovale, and also in the left superior occipital lobe and occipital pole cortex. No contralateral right hemisphere or posterior fossa restricted diffusion identified. T2 and FLAIR hyperintensity in the affected areas with no associated hemorrhage or mass effect. Underlying inferior left parietal lobe cortical encephalomalacia and hemosiderin (series 14, image 15). Patchy and confluent bilateral superimposed cerebral white matter T2 and FLAIR hyperintensity. Small chronic microhemorrhage also in the right occipital lobe white matter. 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 Vascular: Absent flow void in the distal right vertebral artery, preserved distal left vertebral  artery and basilar flow void. ICA siphon flow voids are preserved. Cavum septum pellucidum anatomic variant. No ventriculomegaly. No midline shift. Skull and upper cervical spine: Negative. Sinuses/Orbits: Negative aside from mild mucosal thickening and/or fluid in the left sphenoid sinus. Other: Trace right mastoid fluid. Visible internal auditory  structures appear normal. Negative scalp soft tissues. MRA NECK FINDINGS Precontrast time-of-flight images demonstrate asymmetrically decreased antegrade flow signal in the cervical left ICA. Bilateral CCA flow signal appears fairly symmetric. Preserved right ICA flow signal. Flow signal in the left vertebral artery which appears dominant in the neck. Post-contrast MRA images reveal a radiographic string sign stenosis at the left ICA origin, and highly irregular contour of the left ICA bulb with additional mild stenosis at the distal bulb level. See series 26, image 52 and series 110, image 16. Despite these findings there is preserved enhancement of the left ICA to the skullbase and into the left ICA siphon. Only mild irregularity of the right CCA and cervical right ICA. Proximal subclavian arteries are patent but irregular. The right vertebral artery is occluded from its origin to the V3 segment with only faint enhancement in the V4 segment and at the right PICA 0 which may be retrograde. There is mild stenosis at the left vertebral artery origin. The left vertebral artery is otherwise normal to the vertebrobasilar junction. MRA HEAD FINDINGS Absent antegrade flow in the distal right vertebral artery. Preserved flow in the distal left vertebral artery and basilar. SCA and right PCA origins are patent. The left P1 segment is irregular and stenotic. Left posterior communicating artery appears to be the primary source of left PCA flow the left P2 segment is normal. Left P3 branches are attenuated. Right PCA branches have a more normal appearance. Right posterior  communicating artery is diminutive or absent. Antegrade flow in the right ICA siphon to the right terminus with no stenosis. Patent right MCA and ACA origins. Decreased antegrade flow signal in the vertical petrous and proximal cavernous segment of the left ICA siphon (series 104, image 6), but the supraclinoid segment and left ICA to sinus are patent. Left MCA origin and M1 segment are patent with only mild irregularity. Left MCA bifurcation is patent. No left MCA branch occlusion is identified. The left ACA origin is severely stenotic. Patent anterior communicating artery. A2 segments appear symmetric and within normal limits. Moderate stenosis at the right MCA origin, but visible right MCA branches are otherwise within normal limits. IMPRESSION: 1. 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 No associated hemorrhage or mass effect. 2. 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 Patent left MCA, and no left MCA branch occlusion identified. 3. Proximal left PCA P1 stenosis with left posterior communicating artery contribution to the left PCA, corresponding to the left PCA territory involvement. 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 Electronically Signed: By: Genevie Ann M.D. On: 12/23/2016 10:56   Mr Brain Wo Contrast  Addendum Date: 12/23/2016   ADDENDUM REPORT: 12/23/2016 11:20 ADDENDUM: Study discussed by telephone with Dr. Gladstone Lighter on 12/23/2016 at 1113 hours. Electronically Signed   By: Genevie Ann M.D.   On: 12/23/2016 11:20   Result Date: 12/23/2016 CLINICAL DATA:  76 year old male recently treated for sepsis. New onset right upper extremity weakness upon waking at 0300 hours. Weakness progressing to the right leg. Subsequent dense right side hemiplegia. EXAM: MRI HEAD WITHOUT  CONTRAST MRA HEAD WITHOUT CONTRAST MRA NECK WITHOUT AND WITH CONTRAST TECHNIQUE: Multiplanar, multiecho pulse sequences of the brain and surrounding structures were obtained without intravenous contrast. Angiographic images of the Circle of Willis were obtained  using MRA technique without intravenous contrast. Angiographic images of the neck were obtained using MRA technique without and with intravenous contrast. Carotid stenosis measurements (when applicable) are obtained utilizing NASCET criteria, using the distal internal carotid diameter as the denominator. CONTRAST:  83mL MULTIHANCE GADOBENATE DIMEGLUMINE 529 MG/ML IV SOLN COMPARISON:  Head CT without contrast 0323 hours today. FINDINGS: MRI HEAD FINDINGS Brain: Patchy acute areas of restricted diffusion in the left hemisphere including the left superior frontal gyrus, motor strip, sensory strip, pre motor area, left centrum semiovale, and also in the left superior occipital lobe and occipital pole cortex. No contralateral right hemisphere or posterior fossa restricted diffusion identified. T2 and FLAIR hyperintensity in the affected areas with no associated hemorrhage or mass effect. Underlying inferior left parietal lobe cortical encephalomalacia and hemosiderin (series 14, image 15). Patchy and confluent bilateral superimposed cerebral white matter T2 and FLAIR hyperintensity. Small chronic microhemorrhage also in the right occipital lobe white matter. 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 Vascular: Absent flow void in the distal right vertebral artery, preserved distal left vertebral artery and basilar flow void. ICA siphon flow voids are preserved. Cavum septum pellucidum anatomic variant. No ventriculomegaly. No midline shift. Skull and upper cervical spine: Negative. Sinuses/Orbits: Negative aside from mild mucosal thickening and/or fluid in the left sphenoid sinus. Other: Trace right mastoid fluid. Visible internal auditory  structures appear normal. Negative scalp soft tissues. MRA NECK FINDINGS Precontrast time-of-flight images demonstrate asymmetrically decreased antegrade flow signal in the cervical left ICA. Bilateral CCA flow signal appears fairly symmetric. Preserved right ICA flow signal. Flow signal in the left vertebral artery which appears dominant in the neck. Post-contrast MRA images reveal a radiographic string sign stenosis at the left ICA origin, and highly irregular contour of the left ICA bulb with additional mild stenosis at the distal bulb level. See series 26, image 52 and series 110, image 16. Despite these findings there is preserved enhancement of the left ICA to the skullbase and into the left ICA siphon. Only mild irregularity of the right CCA and cervical right ICA. Proximal subclavian arteries are patent but irregular. The right vertebral artery is occluded from its origin to the V3 segment with only faint enhancement in the V4 segment and at the right PICA 0 which may be retrograde. There is mild stenosis at the left vertebral artery origin. The left vertebral artery is otherwise normal to the vertebrobasilar junction. MRA HEAD FINDINGS Absent antegrade flow in the distal right vertebral artery. Preserved flow in the distal left vertebral artery and basilar. SCA and right PCA origins are patent. The left P1 segment is irregular and stenotic. Left posterior communicating artery appears to be the primary source of left PCA flow the left P2 segment is normal. Left P3 branches are attenuated. Right PCA branches have a more normal appearance. Right posterior communicating artery is diminutive or absent. Antegrade flow in the right ICA siphon to the right terminus with no stenosis. Patent right MCA and ACA origins. Decreased antegrade flow signal in the vertical petrous and proximal cavernous segment of the left ICA siphon (series 104, image 6), but the supraclinoid segment and left ICA to sinus are patent. Left  MCA origin and M1 segment are patent with only mild irregularity. Left MCA bifurcation is patent. No left MCA branch occlusion is identified. The left ACA origin is severely stenotic. Patent anterior communicating artery. A2 segments appear symmetric and within normal limits. Moderate stenosis at the right MCA origin, but  visible right MCA branches are otherwise within normal limits. IMPRESSION: 1. 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 No associated hemorrhage or mass effect. 2. 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 Patent left MCA, and no left MCA branch occlusion identified. 3. Proximal left PCA P1 stenosis with left posterior communicating artery contribution to the left PCA, corresponding to the left PCA territory involvement. 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 Electronically Signed: By: Genevie Ann M.D. On: 12/23/2016 10:56   Dg Chest Port 1 View  Result Date: 12/31/2016 CLINICAL DATA:  76 year old male with increasing lethargy, now minimally responsive. Draining wound to left shoulder, status post incision and drainage of subcutaneous abscess of the upper back on 12/21/2016. Fever. EXAM: PORTABLE CHEST 1 VIEW COMPARISON:  Chest radiographs 12/18/2016 and earlier. FINDINGS: Portable AP supine view at 0958 hours. Percutaneous drain projects over the left axilla and scapula. No subcutaneous gas identified. Stable lung volumes. Allowing for portable technique the lungs are clear. Mediastinal contours remain within normal limits. Visualized tracheal air column is within normal limits. No acute osseous abnormality identified. IMPRESSION: 1.  No acute cardiopulmonary abnormality. 2. Left shoulder region percutaneous drain. No acute osseous abnormality identified. Electronically  Signed   By: Genevie Ann M.D.   On: 01/16/2017 10:19    Microbiology Recent Results (from the past 240 hour(s))  Blood Culture (routine x 2)     Status: None   Collection Time: 12/24/2016  9:48 AM  Result Value Ref Range Status   Specimen Description BLOOD RIGHT [facility]  Final   Special Requests   Final    BOTTLES DRAWN AEROBIC AND ANAEROBIC Blood Culture adequate volume   Culture NO GROWTH 5 DAYS  Final   Report Status 01/17/2017 FINAL  Final  Blood Culture (routine x 2)     Status: None   Collection Time: 01/17/2017  9:48 AM  Result Value Ref Range Status   Specimen Description BLOOD RIGHT HAND  Final   Special Requests   Final    BOTTLES DRAWN AEROBIC AND ANAEROBIC Blood Culture results may not be optimal due to an inadequate volume of blood received in culture bottles   Culture NO GROWTH 5 DAYS  Final   Report Status 01/17/2017 FINAL  Final  Urine culture     Status: Abnormal   Collection Time: 01/11/2017  9:48 AM  Result Value Ref Range Status   Specimen Description URINE, RANDOM  Final   Special Requests NONE  Final   Culture >=100,000 COLONIES/mL PSEUDOMONAS AERUGINOSA (A)  Final   Report Status 01/15/2017 FINAL  Final   Organism ID, Bacteria PSEUDOMONAS AERUGINOSA (A)  Final      Susceptibility   Pseudomonas aeruginosa - MIC*    CEFTAZIDIME 16 INTERMEDIATE Intermediate     CIPROFLOXACIN <=0.25 SENSITIVE Sensitive     GENTAMICIN <=1 SENSITIVE Sensitive     IMIPENEM 2 SENSITIVE Sensitive     CEFEPIME INTERMEDIATE Intermediate     * >=100,000 COLONIES/mL PSEUDOMONAS AERUGINOSA  Aerobic/Anaerobic Culture (surgical/deep wound)     Status: None   Collection Time: 01/08/2017  5:43 PM  Result Value Ref Range Status   Specimen Description SHOULDER  Final   Special Requests NONE  Final   Gram Stain   Final    MODERATE WBC PRESENT, PREDOMINANTLY PMN  MODERATE GRAM POSITIVE COCCI IN PAIRS MODERATE GRAM NEGATIVE RODS Performed at [facility], [place] [address] [place], [place] [address]     Culture   Final    MODERATE PROTEUS MIRABILIS MODERATE ENTEROBACTER CLOACAE FEW PEPTOSTREPTOCOCCUS SPECIES    Report Status 01/17/2017 FINAL  Final   Organism ID, Bacteria PROTEUS MIRABILIS  Final   Organism ID, Bacteria ENTEROBACTER CLOACAE  Final      Susceptibility   Enterobacter cloacae - MIC*    CEFAZOLIN >=64 RESISTANT Resistant     CEFEPIME <=1 SENSITIVE Sensitive     CEFTAZIDIME <=1 SENSITIVE Sensitive     CEFTRIAXONE <=1 SENSITIVE Sensitive     CIPROFLOXACIN <=0.25 SENSITIVE Sensitive     GENTAMICIN <=1 SENSITIVE Sensitive     IMIPENEM <=0.25 SENSITIVE Sensitive     TRIMETH/SULFA <=20 SENSITIVE Sensitive     PIP/TAZO <=4 SENSITIVE Sensitive     * MODERATE ENTEROBACTER CLOACAE   Proteus mirabilis - MIC*    AMPICILLIN <=2 SENSITIVE Sensitive     CEFAZOLIN <=4 SENSITIVE Sensitive     CEFEPIME <=1 SENSITIVE Sensitive     CEFTAZIDIME <=1 SENSITIVE Sensitive     CEFTRIAXONE <=1 SENSITIVE Sensitive     CIPROFLOXACIN <=0.25 SENSITIVE Sensitive     GENTAMICIN <=1 SENSITIVE Sensitive     IMIPENEM 2 SENSITIVE Sensitive     TRIMETH/SULFA <=20 SENSITIVE Sensitive     AMPICILLIN/SULBACTAM <=2 SENSITIVE Sensitive     PIP/TAZO <=4 SENSITIVE Sensitive     * MODERATE PROTEUS MIRABILIS    Lab Basic Metabolic Panel:  Recent Labs Lab 01/13/17 0441 01/14/17 0510 01/14/17 1445  NA 151* 150* 148*  K 3.8 3.6 3.4*  CL 116* 119* 118*  CO2 23 21* 21*  GLUCOSE 96 102* 119*  BUN 114* 112* 110*  CREATININE 4.17* 3.30* 2.84*  CALCIUM 9.9 9.3 9.2   Liver Function Tests: No results for input(s): AST, ALT, ALKPHOS, BILITOT, PROT, ALBUMIN in the last 168 hours. No results for input(s): LIPASE, AMYLASE in the last 168 hours. No results for input(s): AMMONIA in the last 168 hours. CBC:  Recent Labs Lab 01/13/17 0441 01/14/17 0510  WBC 40.8* 28.6*  HGB 13.9 13.2  HCT 41.8 39.7*  MCV 90.3 90.5  PLT 266 214   Cardiac Enzymes: No results for input(s): CKTOTAL, CKMB, CKMBINDEX,  TROPONINI in the last 168 hours. Sepsis Labs:  Recent Labs Lab 01/01/2017 1447 01/15/2017 1843 01/13/17 0441 01/14/17 0510  PROCALCITON 2.30  --   --  1.51  WBC  --   --  40.8* 28.6*  LATICACIDVEN  --  1.9  --   --      Karee Forge 01/19/2017, 11:57 AM

## 2017-01-22 NOTE — Progress Notes (Signed)
[facility] [place] NAME: William Kennedy    MR#:  [id number]  DATE OF BIRTH:  December 14, 1940  SUBJECTIVE:  Patient is very lethargic. Noncommunicative. Wife in the room. No new issues per RN Pt with comfort care REVIEW OF SYSTEMS:   Review of Systems  Unable to perform ROS: Medical condition   Tolerating Diet:npo   DRUG ALLERGIES:  No Known Allergies  VITALS:  Blood pressure (!) 108/57, pulse (!) 136, temperature (!) 101.6 F (38.7 C), temperature source Axillary, resp. rate (!) 31, height 6' (1.829 m), weight 94.1 kg (207 lb 6.6 oz), SpO2 90 %.  PHYSICAL EXAMINATION:   Physical Exam  GENERAL:  76 y.o.-year-old patient lying in the bed with no acute distress. Chronically ill Limited exam HEENT: Head atraumatic, normocephalic. Oropharynx and nasopharynx clear.  NECK:  Supple, no jugular venous distention. No thyroid enlargement, no tenderness.  LUNGS: Normal breath sounds bilaterally, no wheezing, rales, rhonchi. No use of accessory muscles of respiration.  CARDIOVASCULAR: S1, S2 normal. No murmurs, rubs, or gallops.    NEUROLOGIC: Unable to assess given patient's condition PSYCHIATRIC: Patient is lethargic   LABORATORY PANEL:  CBC  Recent Labs Lab 01/14/17 0510  WBC 28.6*  HGB 13.2  HCT 39.7*  PLT 214    Chemistries   Recent Labs Lab 12/31/2016 0948  01/14/17 1445  NA 147*  < > 148*  K 4.4  < > 3.4*  CL 106  < > 118*  CO2 23  < > 21*  GLUCOSE 106*  < > 119*  BUN 108*  < > 110*  CREATININE 5.26*  < > 2.84*  CALCIUM 11.6*  < > 9.2  AST 39  --   --   ALT 35  --   --   ALKPHOS 106  --   --   BILITOT 0.8  --   --   < > = values in this interval not displayed. Cardiac Enzymes  Recent Labs Lab 12/30/2016 0948  TROPONINI <0.03   RADIOLOGY:  No results found. ASSESSMENT AND PLAN:   William Kennedy  is a 76 y.o. male with a known history of Previous cerebral infarct, abscess of left shoulder with Penrose drains  and on oral antibiotics brought in by wife because of altered mental status, patient was very confused since yesterday. Wife noticed that significant increase in drainage from left shoulder abscess since yesterday night. She is unable to get him to go to doctor's appointment and she noticed that he is very confused and also taking out his Foley catheter. Patient found to have urinary retention up to 1 L on bladder scan in the emergency room  1Acute encephalopathy/altered mental status secondary to sepsis from UTI  2. Leukocytosis -Patient has history of CLL is normal white count is around 25,000  3. History of BPH with urinary retention and chronic Foley catheter that was placed about a month ago with urinary retention secondary to Foley clogging and now with UTI -Patient's Foley changed  4. Acute renal failure secondary to ATN due to sepsis and post obstructive uropathy  5. Acute hypernatremia hyperchloremia secondary to significant dehydration with poor by mouth intake  6. Left back abscess with recent drain 2  Discussed  with wife. Patient is now comfort measures only. Very poor prognosis overall. Palliative care recommends pt stay in the hospital since he is actively dying.  Patient is DO NOT RESUSCITATE  And nurse may pronounce  Case discussed  with Care Management/Social Worker. Management plans discussed with the family and they are in agreement.  CODE STATUS: DO NOT RESUSCITATE   TOTAL TIME TAKING CARE OF THIS PATIENT: 15 minutes.  >50% time spent on counselling and coordination of care wife  Note: This dictation was prepared with Dragon dictation along with smaller phrase technology. Any transcriptional errors that result from this process are unintentional.  Azura Tufaro M.D on Jan 30, 2017 at 11:53 AM  Between 7am to 6pm - Pager - [phone]  After 6pm go to [email or web address] - password [email or web address]  Office  [phone]  CC: Primary care  physician; [name], MD

## 2017-01-22 NOTE — Progress Notes (Signed)
Daily Progress Note   Patient Name: William Kennedy       Date: 02-10-2017 DOB: [birth year]  Age: 76 y.o. MRN#: [id number] Attending Physician: Fritzi Mandes, MD Primary Care Physician: [name], MD Admit Date: 01/07/2017  Reason for Consultation/Follow-up: Establishing goals of care and Terminal Care  Subjective/GOC: Patient unresponsive to sternal rub. Shallow respirations. Appears comfortable.   Wife, Scottie, at bedside. She states "This is the most peaceful he has been." He has been unresponsive with her but did smile yesterday when she asked if he saw angels. Scottie shares stories of Mr. Fofana. She has good support from family and friends. Therapeutic listening and emotional/spiritual support provided.    Review of Systems  Unable to perform ROS: Patient unresponsive    Length of Stay: 6  Current Medications: Scheduled Meds:  . sodium chloride flush  10-40 mL Intracatheter Q12H    Continuous Infusions:   PRN Meds: acetaminophen **OR** acetaminophen, glycopyrrolate **OR** glycopyrrolate **OR** glycopyrrolate, haloperidol **OR** haloperidol **OR** haloperidol lactate, HYDROmorphone, LORazepam **OR** LORazepam **OR** LORazepam, ondansetron **OR** ondansetron (ZOFRAN) IV, polyvinyl alcohol, sodium chloride flush  Physical Exam  Constitutional: He appears ill.  Cardiovascular: Regular rhythm.   Irregular, pulses weak, thready  Pulmonary/Chest: No accessory muscle usage. No tachypnea. No respiratory distress. He has decreased breath sounds.  shallow  Musculoskeletal: He exhibits edema (generalized).  Neurological: He is unresponsive.  Skin: Skin is warm. There is pallor.  Nursing note and vitals reviewed.          Vital Signs: BP (!) 108/57 (BP Location: Right Arm)    Pulse (!) 136   Temp (!) 101.6 F (38.7 C) (Axillary)   Resp (!) 31   Ht 6' (1.829 m)   Wt 94.1 kg (207 lb 6.6 oz)   SpO2 90%   BMI 28.13 kg/m  SpO2: SpO2: 90 % O2 Device: O2 Device: Not Delivered O2 Flow Rate: O2 Flow Rate (L/min): 0.5 L/min  Intake/output summary: No intake or output data in the 24 hours ending 02/10/17 1043 LBM: Last BM Date: 01/16/17 Baseline Weight: Weight: 104.3 kg (230 lb) Most recent weight: Weight: 94.1 kg (207 lb 6.6 oz)  Palliative Assessment/Data: 10%    Flowsheet Rows     Most Recent Value  Intake Tab  Referral Department  Hospitalist  Unit at Time of  Referral  ICU  Palliative Care Primary Diagnosis  Sepsis/Infectious Disease  Palliative Care Type  Return patient Palliative Care  Reason for referral  Clarify Goals of Care  Date first seen by Palliative Care  01/13/17  Clinical Assessment  Palliative Performance Scale Score  10%  Psychosocial & Spiritual Assessment  Palliative Care Outcomes  Patient/Family meeting held?  Yes  Who was at the meeting?  wife  Palliative Care Outcomes  Clarified goals of care, Provided end of life care assistance, Provided psychosocial or spiritual support, Improved pain interventions, Improved non-pain symptom therapy      Patient Active Problem List   Diagnosis Date Noted  . Terminal care   . Acute cystitis with hematuria   . Lactic acidosis   . Back abscess   . Palliative care by specialist   . Sepsis associated hypotension ([place]) 12/26/2016  . Cerebral infarction ([place])   . Goals of care, counseling/discussion   . Palliative care encounter   . Cellulitis   . Urinary tract infection without hematuria   . Confusion 12/06/2016  . Urinary retention   . Microscopic hematuria   . Sepsis ([place]) 11/30/2016  . Left ureteral stone   . Cellulitis and abscess of trunk 10/29/2016  . CLL (chronic lymphocytic leukemia) ([place]) 10/21/2013    Palliative Care Assessment & Plan   Patient Profile:76 y.o. male   with past medical history of CLL, cerebral infarct, BPH, and left upper back abscess admitted on 12/31/2016 with lethargy from home. Patient with history of left upper back abscess since approximately March with positive cultures for methicillin sensitive staphylococcus aureus. Followed by Dr. Tamala Julian with general surgery. I&D of wound on 7/30 and has been receiving antibiotics. Recent hospitalization for sepsis secondary to UTI and abscess. Hospital stay complicated by acute stroke on 12/23/16. Also positive for cdiff and receiving vancomycin. Patient/wife declined SNF for rehab and returned home. In ED, patient with severe sepsis and acute kidney injury. Positive for UTI. Phenylephrine initiated. Infectious disease, nephrology, and surgery following. Palliative medicine consultation for goals of care. Patient care transitioned to comfort measures only on 8/25.   Assessment/Recommendations/Plan   Continue comfort measures only. Medications as needed to ensure comfort.   Patient is not stable for transfer to home with Hospice or Hospice home for concerns of dying during transport.   Anticipate hospital death   Goals of Care and Additional Recommendations:  Limitations on Scope of Treatment: Full Comfort Care  Code Status:  DNR  Prognosis:   Hours - Days  Discharge Planning:  Anticipated Hospital Death  Care plan was discussed with wife, RN CM, and RN  Thank you for allowing the Palliative Medicine Team to assist in the care of this patient.   Time In: 1015 Time Out: 1040 Total Time 60min Prolonged Time Billed No      Greater than 50%  of this time was spent counseling and coordinating care related to the above assessment and plan.  Ihor Dow, FNP-C Palliative Medicine Team  Phone: [phone] Fax: [phone]  Please contact Palliative Medicine Team phone at [phone] for questions and concerns.

## 2017-01-22 DEATH — deceased

## 2018-04-09 IMAGING — DX DG CHEST 1V PORT
1 series · 1 of 1 positions shown · non-contrast
Comparison: Portable chest x-ray October 29, 2016

CLINICAL DATA: Confirm central line placement.  History of leukemia

EXAM:
PORTABLE CHEST 1 VIEW

[chest ap]
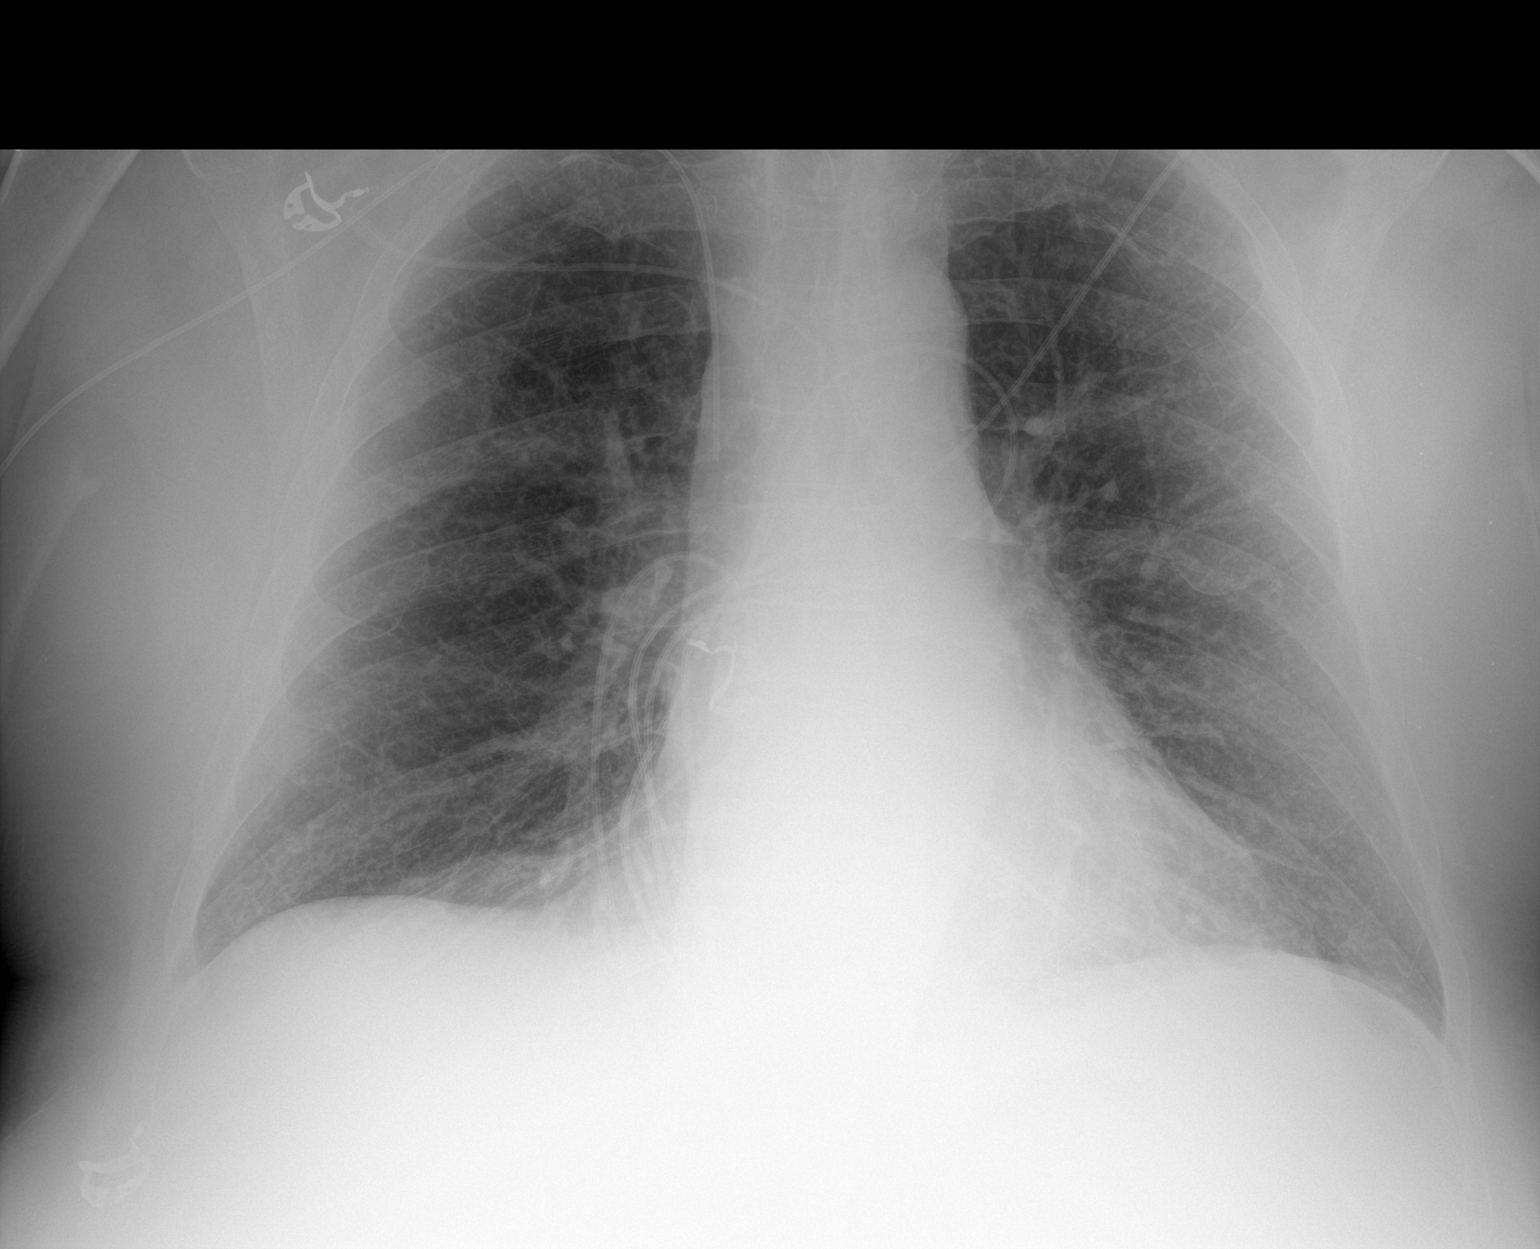

[1 of 1 positions shown; findings below may reference images not displayed]

FINDINGS: The lungs are well-expanded. The interstitial markings are coarse.
The PICC line tip projects over the midportion of the SVC. There is
no pneumothorax or pleural effusion. The heart and pulmonary
vascularity are normal.
IMPRESSION: Chronic bronchitic-smoking related changes, stable. There is no
postprocedure complication following right-sided PICC line
placement. There is no acute cardiopulmonary abnormality.

## 2018-05-27 IMAGING — CT CT HEAD W/O CM
3 series · 15 of 47 positions shown, 18 images · non-contrast
Comparison: 11/30/2016

CLINICAL DATA: New onset right arm weakness.

EXAM:
CT HEAD WITHOUT CONTRAST
TECHNIQUE: Contiguous axial images were obtained from the base of the skull
through the vertex without intravenous contrast.

[Series 2: head wo · axial · 0.50mm/px · z∈[-137,-2]mm · 9 of 33 slices shown, 12 images]
[im 3/33  brain]
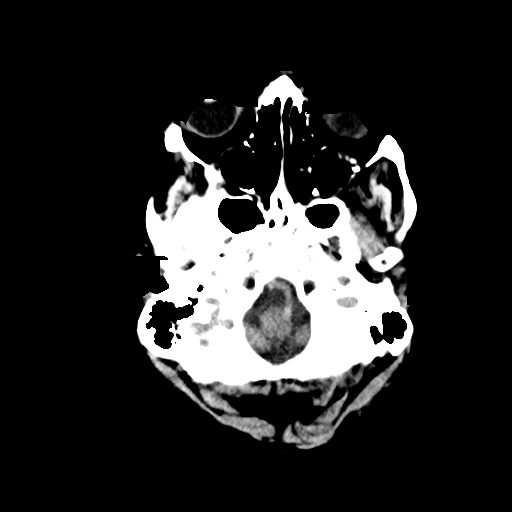
[im 3/33  bone]
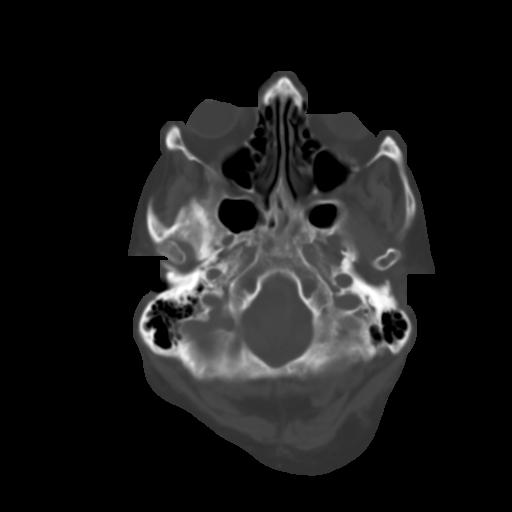
[im 6/33  brain]
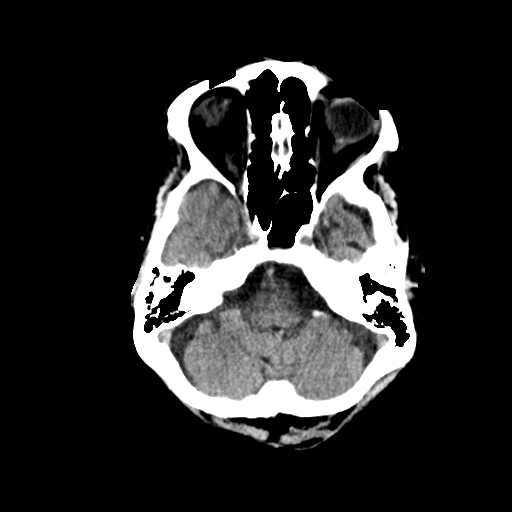
[im 9/33  brain]
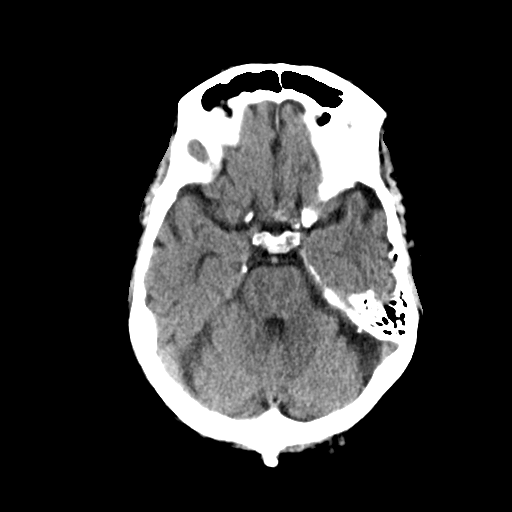
[im 13/33  brain]
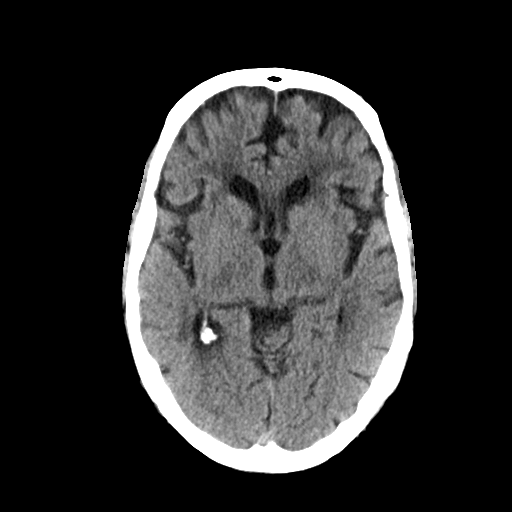
[im 17/33  brain]
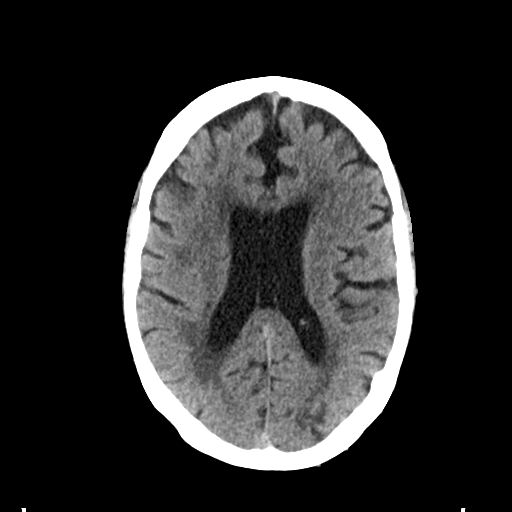
[im 17/33  bone]
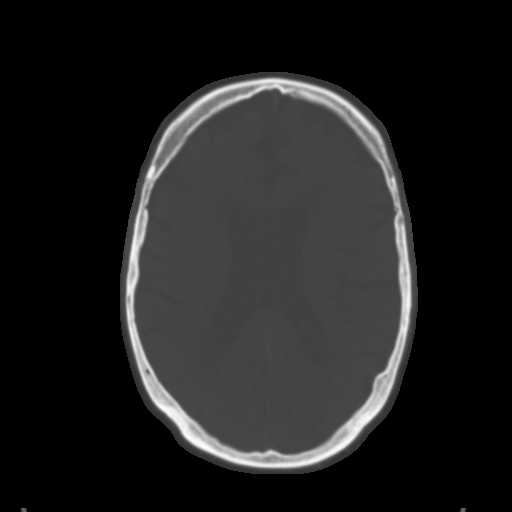
[im 20/33  brain]
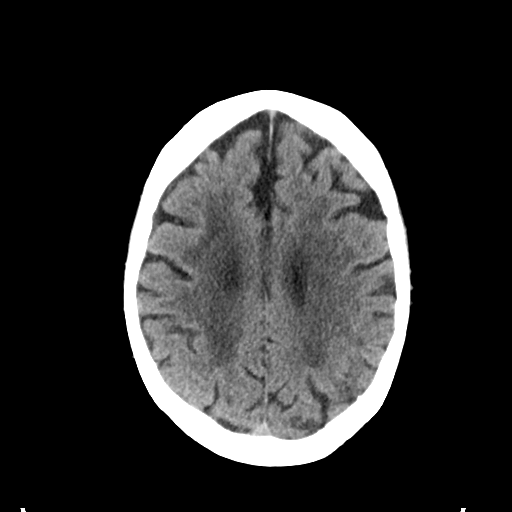
[im 24/33  brain]
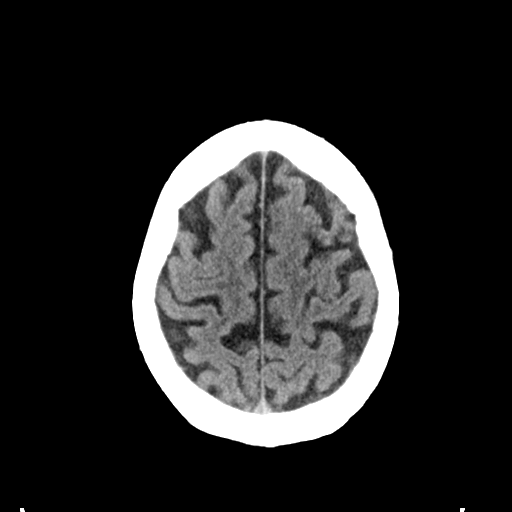
[im 27/33  brain]
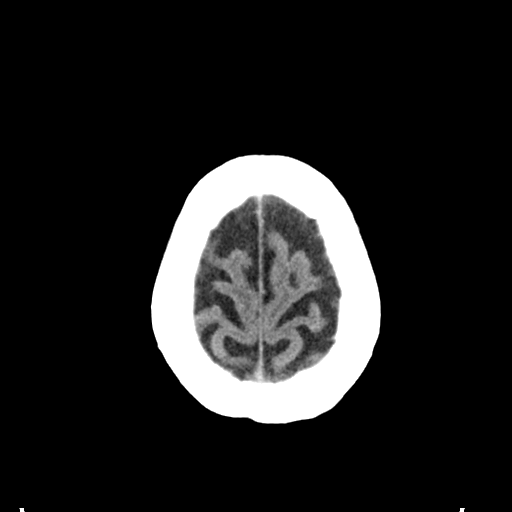
[im 30/33  brain]
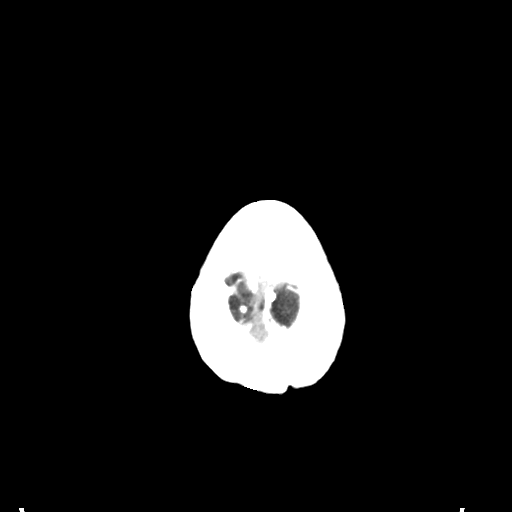
[im 30/33  bone]
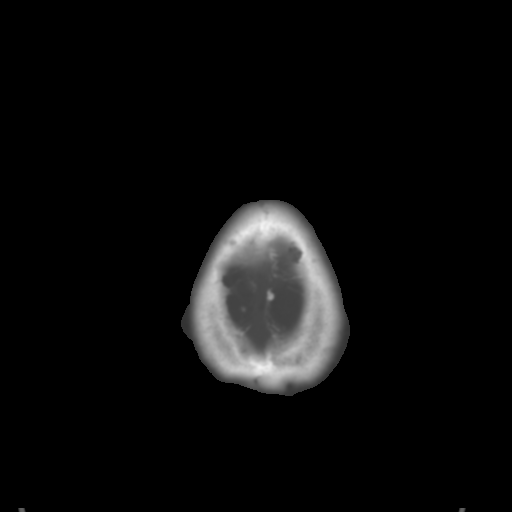

[Series 4: coronal soft tissue · coronal · 0.34mm/px · 3 of 70 slices shown]
[im 24/70  brain]
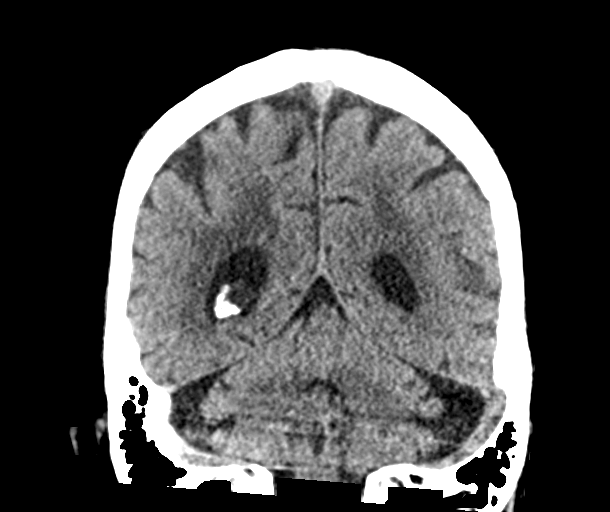
[im 31/70  brain]
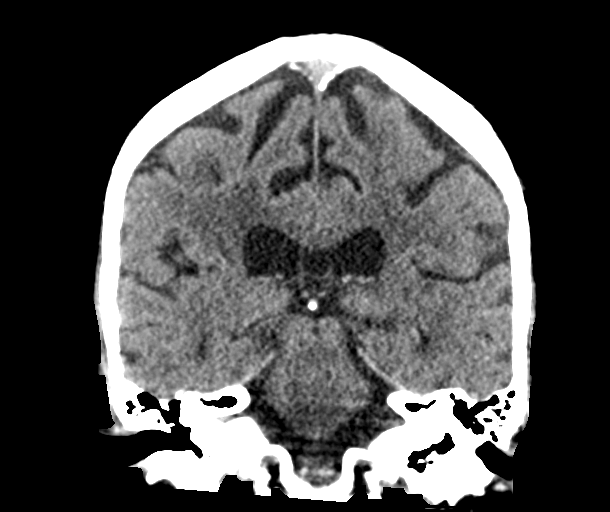
[im 39/70  brain]
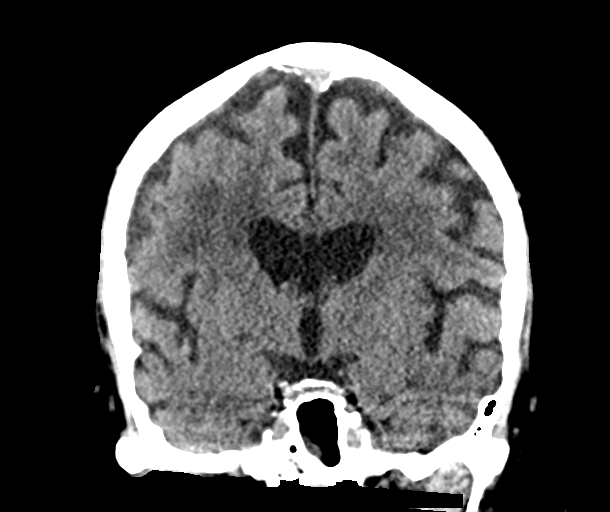

[Series 5: sagittal soft tissue · sagittal · 0.33mm/px · 3 of 53 slices shown]
[im 18/53  brain]
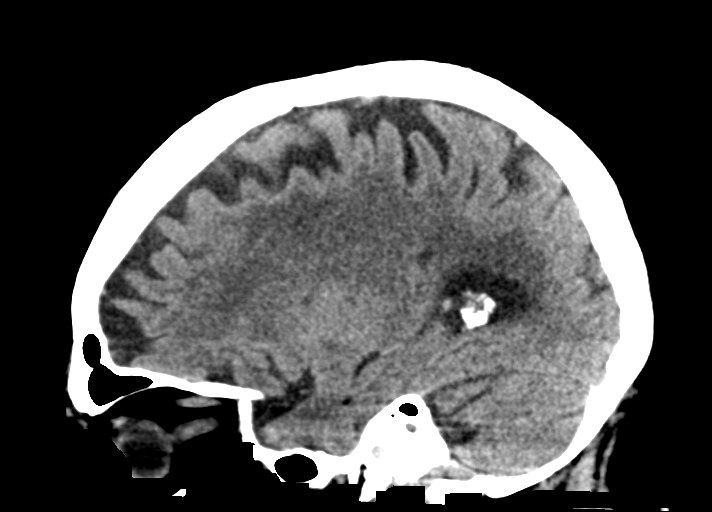
[im 27/53  brain]
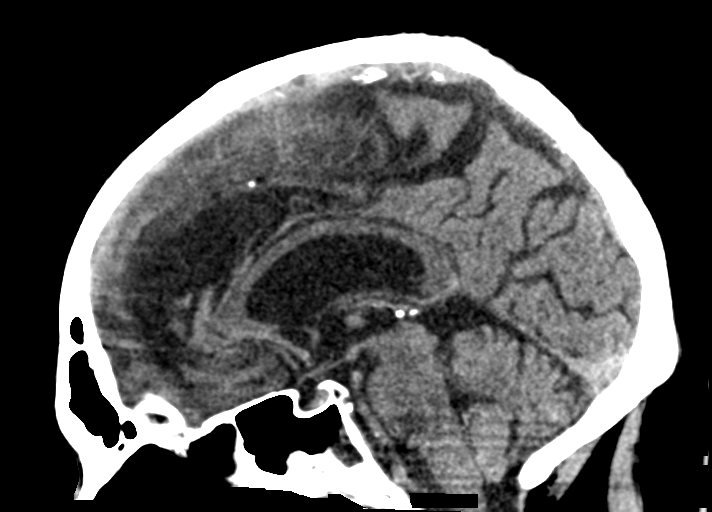
[im 35/53  brain]
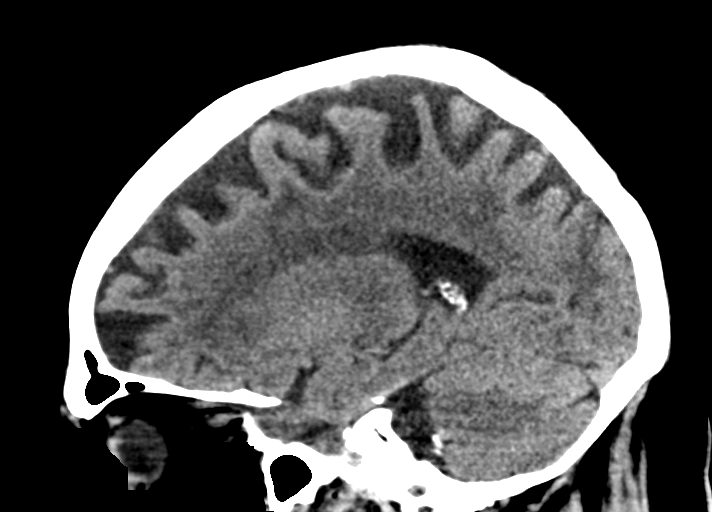

[15 of 47 positions shown; findings below may reference images not displayed]

FINDINGS: Brain: Diffuse cerebral atrophy. Mild ventricular dilatation
consistent with central atrophy. Low-attenuation changes in the deep
white matter consistent small vessel ischemia. Cavum septum
pellucidum. Small area of old encephalomalacia in the left posterior
parietal lobe unchanged since previous study. No evidence of acute
infarction, hemorrhage, hydrocephalus, extra-axial collection or
mass lesion/mass effect.

Vascular: Vascular calcifications are present.

Skull: No depressed skull fractures.

Sinuses/Orbits: Paranasal sinuses and mastoid air cells are clear.

Other: No change since previous study.
IMPRESSION: No acute intracranial abnormalities. Chronic atrophy and small
vessel ischemic changes. Probable small old infarct in the left
posterior parietal region. No change since previous studies.
